# Patient Record
Sex: Male | Born: 1960
Health system: Southern US, Community
[De-identification: ages and names within clinical notes are randomized; demographics above are authoritative.]

## PROBLEM LIST (undated history)

## (undated) DIAGNOSIS — E119 Type 2 diabetes mellitus without complications: Secondary | ICD-10-CM

## (undated) DIAGNOSIS — I10 Essential (primary) hypertension: Secondary | ICD-10-CM

## (undated) DIAGNOSIS — K219 Gastro-esophageal reflux disease without esophagitis: Secondary | ICD-10-CM

## (undated) DIAGNOSIS — Z9889 Other specified postprocedural states: Secondary | ICD-10-CM

## (undated) DIAGNOSIS — I5042 Chronic combined systolic (congestive) and diastolic (congestive) heart failure: Secondary | ICD-10-CM

## (undated) DIAGNOSIS — I350 Nonrheumatic aortic (valve) stenosis: Secondary | ICD-10-CM

## (undated) DIAGNOSIS — B3322 Viral myocarditis: Secondary | ICD-10-CM

## (undated) DIAGNOSIS — I719 Aortic aneurysm of unspecified site, without rupture: Secondary | ICD-10-CM

## (undated) DIAGNOSIS — F32A Depression, unspecified: Secondary | ICD-10-CM

## (undated) DIAGNOSIS — I7121 Aneurysm of the ascending aorta, without rupture: Secondary | ICD-10-CM

## (undated) DIAGNOSIS — I712 Thoracic aortic aneurysm, without rupture: Secondary | ICD-10-CM

## (undated) DIAGNOSIS — Z953 Presence of xenogenic heart valve: Secondary | ICD-10-CM

## (undated) DIAGNOSIS — F419 Anxiety disorder, unspecified: Secondary | ICD-10-CM

## (undated) DIAGNOSIS — I428 Other cardiomyopathies: Secondary | ICD-10-CM

## (undated) DIAGNOSIS — I77810 Thoracic aortic ectasia: Secondary | ICD-10-CM

## (undated) DIAGNOSIS — I2510 Atherosclerotic heart disease of native coronary artery without angina pectoris: Secondary | ICD-10-CM

## (undated) DIAGNOSIS — Z8679 Personal history of other diseases of the circulatory system: Secondary | ICD-10-CM

## (undated) DIAGNOSIS — I471 Supraventricular tachycardia, unspecified: Secondary | ICD-10-CM

## (undated) DIAGNOSIS — Q2543 Congenital aneurysm of aorta: Secondary | ICD-10-CM

## (undated) DIAGNOSIS — E559 Vitamin D deficiency, unspecified: Secondary | ICD-10-CM

## (undated) DIAGNOSIS — J302 Other seasonal allergic rhinitis: Secondary | ICD-10-CM

## (undated) DIAGNOSIS — E785 Hyperlipidemia, unspecified: Secondary | ICD-10-CM

## (undated) HISTORY — DX: Gastro-esophageal reflux disease without esophagitis: K21.9

## (undated) HISTORY — PX: CARDIAC VALVE REPLACEMENT: SHX585

## (undated) HISTORY — DX: Depression, unspecified: F32.A

## (undated) HISTORY — DX: Type 2 diabetes mellitus without complications: E11.9

## (undated) HISTORY — DX: Hyperlipidemia, unspecified: E78.5

## (undated) HISTORY — DX: Thoracic aortic ectasia: I77.810

## (undated) HISTORY — DX: Anxiety disorder, unspecified: F41.9

## (undated) HISTORY — DX: Other seasonal allergic rhinitis: J30.2

## (undated) HISTORY — DX: Essential (primary) hypertension: I10

## (undated) HISTORY — DX: Viral myocarditis: B33.22

## (undated) HISTORY — PX: INGUINAL HERNIA REPAIR: SUR1180

## (undated) HISTORY — DX: Vitamin D deficiency, unspecified: E55.9

---

## 1980-02-08 HISTORY — PX: ORCHIECTOMY: SHX2116

## 1998-05-09 HISTORY — PX: VARICOSE VEIN SURGERY: SHX832

## 1998-05-11 ENCOUNTER — Ambulatory Visit (HOSPITAL_COMMUNITY): Admission: RE | Admit: 1998-05-11 | Discharge: 1998-05-11 | Payer: Self-pay | Admitting: Vascular Surgery

## 1998-05-11 ENCOUNTER — Encounter: Payer: Self-pay | Admitting: Vascular Surgery

## 1999-03-16 ENCOUNTER — Emergency Department (HOSPITAL_COMMUNITY): Admission: EM | Admit: 1999-03-16 | Discharge: 1999-03-16 | Payer: Self-pay | Admitting: Emergency Medicine

## 2000-11-29 ENCOUNTER — Ambulatory Visit (HOSPITAL_COMMUNITY): Admission: RE | Admit: 2000-11-29 | Discharge: 2000-11-29 | Payer: Self-pay | Admitting: Internal Medicine

## 2000-11-29 ENCOUNTER — Encounter: Payer: Self-pay | Admitting: Internal Medicine

## 2002-02-07 DIAGNOSIS — B3322 Viral myocarditis: Secondary | ICD-10-CM

## 2002-02-07 HISTORY — DX: Viral myocarditis: B33.22

## 2002-04-09 ENCOUNTER — Encounter: Payer: Self-pay | Admitting: Internal Medicine

## 2002-04-09 ENCOUNTER — Ambulatory Visit (HOSPITAL_COMMUNITY): Admission: RE | Admit: 2002-04-09 | Discharge: 2002-04-09 | Payer: Self-pay | Admitting: Internal Medicine

## 2002-04-17 ENCOUNTER — Encounter: Payer: Self-pay | Admitting: Internal Medicine

## 2002-04-17 ENCOUNTER — Ambulatory Visit (HOSPITAL_COMMUNITY): Admission: RE | Admit: 2002-04-17 | Discharge: 2002-04-17 | Payer: Self-pay | Admitting: Internal Medicine

## 2002-05-18 ENCOUNTER — Observation Stay (HOSPITAL_COMMUNITY): Admission: EM | Admit: 2002-05-18 | Discharge: 2002-05-19 | Payer: Self-pay | Admitting: Emergency Medicine

## 2002-05-18 ENCOUNTER — Encounter: Payer: Self-pay | Admitting: Internal Medicine

## 2002-06-13 ENCOUNTER — Encounter (INDEPENDENT_AMBULATORY_CARE_PROVIDER_SITE_OTHER): Payer: Self-pay | Admitting: *Deleted

## 2002-06-13 ENCOUNTER — Encounter: Payer: Self-pay | Admitting: Urology

## 2002-06-13 ENCOUNTER — Ambulatory Visit (HOSPITAL_COMMUNITY): Admission: RE | Admit: 2002-06-13 | Discharge: 2002-06-13 | Payer: Self-pay | Admitting: Urology

## 2002-08-09 ENCOUNTER — Ambulatory Visit (HOSPITAL_COMMUNITY): Admission: RE | Admit: 2002-08-09 | Discharge: 2002-08-09 | Payer: Self-pay | Admitting: Internal Medicine

## 2002-08-09 ENCOUNTER — Encounter: Payer: Self-pay | Admitting: Internal Medicine

## 2002-08-26 ENCOUNTER — Ambulatory Visit (HOSPITAL_COMMUNITY): Admission: RE | Admit: 2002-08-26 | Discharge: 2002-08-26 | Payer: Self-pay | Admitting: Cardiology

## 2006-08-01 ENCOUNTER — Ambulatory Visit: Payer: Self-pay | Admitting: Internal Medicine

## 2006-08-01 ENCOUNTER — Inpatient Hospital Stay (HOSPITAL_COMMUNITY): Admission: EM | Admit: 2006-08-01 | Discharge: 2006-08-03 | Payer: Self-pay | Admitting: Emergency Medicine

## 2006-08-02 ENCOUNTER — Encounter: Payer: Self-pay | Admitting: Internal Medicine

## 2006-08-17 ENCOUNTER — Observation Stay (HOSPITAL_COMMUNITY): Admission: EM | Admit: 2006-08-17 | Discharge: 2006-08-18 | Payer: Self-pay | Admitting: Emergency Medicine

## 2006-08-17 ENCOUNTER — Ambulatory Visit: Payer: Self-pay | Admitting: Cardiology

## 2008-02-08 HISTORY — PX: HERNIA REPAIR: SHX51

## 2008-09-25 ENCOUNTER — Observation Stay: Payer: Self-pay | Admitting: General Surgery

## 2010-06-22 NOTE — H&P (Signed)
NAMEJAVELL, Tyler Berger                  ACCOUNT NO.:  [id number]   MEDICAL RECORD NO.:  [id number]          PATIENT TYPE:  EMS   LOCATION:  MAJO                         FACILITY:  [facility]   PHYSICIAN:  Bevelyn Buckles. Bensimhon, MDDATE OF BIRTH:  1960/08/31   DATE OF ADMISSION:  08/01/2006  DATE OF DISCHARGE:                              HISTORY & PHYSICAL   PRIMARY CARDIOLOGIST:  Dr. Rollene Rotunda.   PRIMARY CARE PHYSICIAN:  Dr. Marlowe Shores.   HISTORY OF PRESENT ILLNESS:  This is a 50 year old Caucasian male with  known history of nonischemic cardiomyopathy, hyperlipidemia,  pancreatitis and aortic valve disease who has not been seen by  cardiology in approximately 4 years.  He had been followed by his  primary care physician and had some medication adjusted probably 8  months ago secondary to insurance issues since he had been recently laid  off.  The patient has been complaining of shortness of breath, which  occurred in the middle of the night, last night, and again this morning.  When he awoke he felt like someone was sitting his chest.  There was  some radiation down the left arm with some tingling in his hand and  having trouble taking a deep breath.  The patient did not take anything  for the pain and eventually came to the emergency room with these  complaints.  The patient was given morphine, nitroglycerin and aspirin  in the emergency room and now the pain is significantly diminished.  He  is able to talk and not complaining of any chest pain.  He does have  some mild discomfort when taking deep breaths.  The patient admits that  he has continued to smoke, although has been advised against it, but per  cardiology 4 years ago.   REVIEW OF SYSTEMS:  Positive for some sweating, some shortness of  breath, pressure in the chest, some claudication symptoms with walking  his dogs and also some mild nausea without vomiting this a.m. Otherwise  is negative.   PAST MEDICAL HISTORY:   Includes:  1. Hypertension.  2. Hyperlipidemia.  3. Pericarditis.  4. Left kidney cyst.  5. Left testicle removal.  6. Nonischemic cardiomyopathy.  7. Aortic valve disease.   PAST SURGICAL HISTORY:  He had surgery for an undistended testicle.   FAMILY HISTORY:  Mother is alive at age 43 with a history of breast  cancer, diabetes and obesity.  Father died of head and neck cancer.  He  has 1 sister who is 84 years old who is in good health.   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  The patient is a 1-2 pack a day smoker  on and off over the last 30 years.  He drinks 12 beers a week, but his  partners he diminishes this and can drink up to 8 beers a night several  times a week.  The patient walks approximately 1/2 to 1 mile a day.  No  herbal medicine use.  No illicit drug use.  CURRENT MEDICATIONS:  At home:  1. Lasix 80 mg once a day.  2. Atenolol 100 mg once a day.  3. Enalapril 20 mg once a day.  (the following meds the patient was taking, but has stopped x8 months).  1. Digoxin.  2. Spironolactone.  3. A statin.   CURRENT LABS1:  Sodium 138, potassium 4.2, chloride 103, CO2 16,  creatinine 1.0, glucose 108, hemoglobin 19.0, hematocrit 56.0.  Chest x-  ray reveals cardiomyopathy, COPD with no acute cardiopulmonary process.  EKG reveals normal sinus rhythm.  Ventricular rate is 78 beats per  minute.  Normal axis.  PR 0.20, QRS 0.08.  There is some nonspecific  changes in leads V1, 2, 4 and 5.   PHYSICAL EXAMINATION:  VITAL SIGNS:  Blood pressure 121/64.  Heart rate  77.  Respirations 16.  O2 saturation 99% on 2 liters, 96% on room air.  HEENT:  Head is normocephalic, atraumatic.  Eyes:  PERRLA.  Mucous  membranes and mouth pink and moist.  Tongue is midline.  NECK:  Supple.  There is no JVD.  There are bilateral carotid bruits  appreciated.  CARDIOVASCULAR:  Regular rate and rhythm.  A 2/6 systolic murmur   auscultated with radiation to the carotids.  LUNGS:  Essentially clear to auscultation, diminished in the bases.  ABDOMEN:  Soft, nontender, 2+ bowel sounds.  No organomegaly is noted.  EXTREMITIES:  No clubbing or edema.  There is some mild cyanosis noted  around the ankle area with some varicosities noted.  NEURO:  Intact.   PAST CARDIAC WORKUP:  Cardiac catheterization, August 26, 2002, revealing  left main normal, LAD diffuse luminal irregularities, first diagonal was  large with luminal irregularities, the second diagonal was small with  luminal irregularities, the second diagonal was small with luminal  irregularities, the circumflex had diffuse luminal irregularities, right  coronary artery was a large dominant vessels with 10-25% lesions before  the PDA.  The left ventriculogram, at that time, in the REO position  revealed an EF of 20%.   The patient was also seen in Dr. Jenene Slicker office in 2004 with repeat  echocardiogram revealing normal EF.   IMPRESSION:  1. Chest pain in a patient with known nonischemic cardiomyopathy.  2. Anxiety.  3. ETOH abuse.  4. Hypercholesterolemia.  5. History of pulmonary hypertension.  6. Hypertension.   PLAN:  This is a 50 year old Caucasian male patient with a known history  of nonischemic cardiomyopathy with complaints of chest pain, shortness  of breath with radiation to the left arm with negative EKG and point of  care markers at present.  The patient was seen and examined by myself  and Dr. Arvilla Meres in the emergency room.  The patient's chest  pain is concerning for unstable angina, but with normal cath in 2004,  this is less worrisome.  The patient has negative EKG and negative point  of care markers.  We will admit the patient to rule out myocardial  infarction.  We will start the patient on heparin, aspirin and check a  2D echo for left ventricular systolic function and aortic valve evaluation with known history of aortic  valve stenosis.  If workup is  negative and pain  resolves, we will do a stress Myoview; otherwise, we will proceed with  cardiac catheterization.  Smoking cessation consult will be completed.  We will also watch for delirium tremens in a patient with heavy alcohol  use at home and we will give benzodiazepines  and proton pump inhibitors  throughout admission.      Bettey Mare. Lyman Bishop, NP      Bevelyn Buckles. Bensimhon, MD  Electronically Signed    KML/MEDQ  D:  08/01/2006  T:  08/01/2006  Job:  [id number]   cc:   Lucky Cowboy, M.D.

## 2010-06-22 NOTE — H&P (Signed)
Tyler Berger, Tyler Berger NO.:  [id number]   MEDICAL RECORD NO.:  [id number]          PATIENT TYPE:  INP   LOCATION:  3743                         FACILITY:  [facility]   PHYSICIAN:  Rollene Rotunda, MD, FACCDATE OF BIRTH:  [birth year]   DATE OF ADMISSION:  08/17/2006  DATE OF DISCHARGE:                              HISTORY & PHYSICAL   PRIMARY CARE PHYSICIAN:  Dr. Oneta Rack.   PRIMARY CARDIOLOGIST:  Dr. Antoine Poche.   HISTORY:  This is a 50 year old white male who is [facility] via EMS [facility] with chest discomfort.  Initial interpretation  of EKG by EMS showed J-point elevation and was initially called in as a  non-STIMI.  However once in the emergency room comparison to old EKGs  did not show any acute changes.   Tyler Berger was recently hospitalized from June 24 to August 03, 2006 with  chest discomfort.  During this admission he underwent stress testing.  EKG did not show any changes.  EF was 53% without signs of ischemia.  Echocardiogram was also performed.  This showed some mild diastolic  dysfunction with EF of 60-65%, mild to moderate AS and AI with a VTI of  2.62 and a mean gradient of 23.  He was discharged home with medical  treatment and reassurance.   Since his discharge on the 26th on the initial first or second day Mr.  Berger states that he felt weak and somewhat drained.  However, he felt  fine until approximately 2-3 days ago when he noticed some, what he  describes as indigestion type feeling and a sensation that something was  caught in his throat.  He felt fine until this morning at approximately  9:00 a.m. when he developed anterior chest discomfort which he described  as someone sitting on his chest.  He felt like he could not get a deep  breath and also admitted to some shortness of breath and nausea.  He  denied any vomiting or diaphoresis.  The discomfort did not radiate.  He  felt initially the discomfort was a 5 and  approximately around noon he  summoned EMS.  He received aspirin, sublingual nitroglycerin and  morphine reducing his discomfort to a 3.  He felt that chest discomfort  was similar to his last admission.  He has also become very anxious with  this chest discomfort.  Thus his presentation.  The patient also states  that he did take a Xanax sometime this morning when he was becoming very  anxious and this did not help his discomfort.   The only thing he has had to eat today was coffee and he also states  that the above discomfort does not resemble the indigestion type feeling  that he had a couple of days ago.   PAST MEDICAL HISTORY:  In addition to the above includes no known drug  allergies.   CURRENT MEDICATIONS:  Include Celexa 40 mg daily, Xanax 0.5 mg p.r.n.,  enalapril 20 mg daily, Lasix 80 mg daily, atenolol 100 mg daily,  Pravachol 40 mg daily, aspirin  81 mg p.r.n.   History includes hypertension, hyperlipidemia.  Last lipid check was  August 02, 2006 and showed a total cholesterol 180, triglycerides 75, HDL  31, LDL 134.  He has a history of pericarditis, left kidney cyst, left  testicle removal, nonischemic cardiomyopathy.  Catheterization in 2004  showed the RCA that was a large dominant vessel with mid 25% tandem  lesions just prior to the PDA.  Otherwise luminal irregularities in the  first and second diagonal and circumflex and LAD.  EF was 20% with  global hypokinesis.  Subsequent echocardiogram on August 02, 2006 showed  an EF of 60-65%.  No LV wall motion abnormalities, mild LVH, mild to  moderate AS with a mean gradient of 23 and a VTI of 2.62.  V max 1.7.   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  He continues to smoke at least one to two packs a  day over the preceding 30 years.  His partner states he drinks at least  three beers per day however elaborates that he used to drink up to 8  beers a day.  The patient  does walk approximately half to one mile per  day.  Denies any illicit drug use, herbal medications and tries to eat  healthy.   FAMILY HISTORY:  Mother is alive at 41 with a history of breast cancer,  diabetes and obesity.  Father is deceased with head and neck cancer.  He  has one sister who is 61 who is in good health.   REVIEW OF SYSTEMS:  In addition to above is notable for anxiety.  Occasional nausea without vomiting.   PHYSICAL EXAM:  GENERAL:  Well-nourished, well-developed, pleasant white  male in no apparent distress.  VITAL SIGNS:  Temperature is 98, blood pressure 112/67, pulse 75,  respirations 20, 98% sat on room air.  HEENT: Was unremarkable.  NECK:  Supple without thyromegaly, adenopathy, JVD or carotid bruits.  CHEST:  Symmetrical excursion.  LUNGS:  Clear to auscultation although somewhat diminished in the bases.  HEART:  PMI is not displaced.  Regular rate and rhythm.  He does have a  2/6 systolic murmur best appreciated upper sternal border radiating to  the carotids.  ABDOMEN:  Slightly rounded.  Bowel sounds present without organomegaly,  masses or tenderness.  EXTREMITIES:  Negative cyanosis, clubbing or edema.  All peripheral  pulses are symmetrical and intact.  He does have some lower extremity  varicosities.  Skin is intact.  NEUROLOGIC:  Intact.   EKGs by EMS showed normal sinus rhythm with J-point elevation.  EKG in  the emergency room also shows J-point elevation, but essentially remains  unchanged from prior EKG.   IMPRESSION:  1. Prolonged chest discomfort of uncertain etiology.  2. Anxiety.  3. Continued tobacco and alcohol use.  4. History of dilated cardiomyopathy with resolution of the LV      dysfunction based on recent echocardiogram.   DISPOSITION:  Dr. Antoine Poche reviewed the patient's history, spoke with  and examined the patient and agrees with the above.  We will admit him  for observation to rule out myocardial infarction.  Given his  history  and presentation we will perform a cardiac CT to further evaluate his  cardiac anatomy.  If this is negative we will contact GI tomorrow after  the CT scan for further evaluation of his symptoms.  He is encouraged  tobacco cessation as well  as  alcohol reduction.  He should probably have a psych evaluation for  depression, EtOH and anxiety.  This could potentially be done as an  outpatient.  We will ask case manager for referral.  If the cardiac CT  is negative he could be potentially be discharged home tomorrow.      Joellyn Rued, PA-C      Rollene Rotunda, MD, [facility]  Electronically Signed    EW/MEDQ  D:  08/17/2006  T:  08/18/2006  Job:  [id number] [id number]   cc:   Lucky Cowboy, M.D.

## 2010-06-22 NOTE — Discharge Summary (Signed)
Tyler Berger                  ACCOUNT NO.:  [id number]   MEDICAL RECORD NO.:  [id number]          PATIENT TYPE:  INP   LOCATION:  4713                         FACILITY:  [facility]   PHYSICIAN:  Tyler Berger, Tyler Berger, Tyler Berger OF BIRTH:  Oct 31, 1960   DATE OF ADMISSION:  08/01/2006  DATE OF DISCHARGE:  08/03/2006                               DISCHARGE SUMMARY   PROCEDURES:  1. A 2-D echocardiogram.  2. Gated exercise treadmill Myoview.   PRIMARY DIAGNOSIS:  Chest pain, enzymes negative for myocardial  infarction and Myoview negative for ischemia.   SECONDARY DIAGNOSES:  1. Hypertension.  2. Hyperlipidemia.  3. Remote history of pericarditis.  4. History of left kidney cyst.  5. Status post left orchiectomy.  6. History of cardiomyopathy, EF 20% in 2004, now normalized.  7. Obesity.  8. Ongoing tobacco and EtOH use.   TIME AT DISCHARGE:  Forty-two minutes.   HOSPITAL COURSE:  Tyler Berger is a 50 year old male with a previous  history of nonischemic cardiomyopathy and an EF in 2004 of 20%.  He came  in with shortness of breath and chest pressure which was improved by a  combination of aspirin, nitroglycerin, and morphine.  He was admitted  for further evaluation.   His cardiac enzymes were negative for MI.  A 2-D echocardiogram was  ordered to follow up on possible aortic valve disease and his EF was 60-  65% now with no regional wall motion abnormalities, although he did have  some diastolic dysfunction and mild to moderate aortic valve stenosis  and aortic valvular regurgitation with an aortic valve area by VTI of  2.62-cm2 and a mean transaortic valve gradient of 23-mmHg.  A chest x-  ray showed COPD but no acute disease.  An exercise treadmill Myoview was  ordered to assess for ischemia.  During the treadmill portion of the  test, he had no chest pain and the exercise-limiting factor was  shortness of breath.  After the treadmill portion of the test was  completed and he was  in recovery, he had some left-sided chest pain  which he stated was similar to his presenting symptom but not quite as  bad.  He had some nonspecific ST changes on his EKG.  His blood pressure  responded appropriately to exercise but was controlled at rest.  He also  had dyslipidemia with a total cholesterol of 180, triglycerides 75, HDL  31, LDL 134.  He is encouraged to eat a heart healthy diet.  He was also  counseled on smoking cessation and stated he was willing to start  Chantix.  Of note, he had thrombocytopenia with a platelet count of 123  on admission.  He had been placed on heparin and then his platelets  dropped from 123-90.  Of note, his labs were reviewed from 2000 and  2004, and his platelet count was 108 in 2000 and 110 in 2004.   His stress test was read as no scar or ischemia and an EF of 53%.  Dr.  Gala Berger reviewed the EKGs and felt that there were nonspecific changes  and not clearly ischemic in origin.  The results were discussed with the  patient, and he was advised that this was a low risk study, and he was  considered stable for discharge.  He was advised that if he continued to  have symptoms, he should recontact [place] and cardiac catheterization could  be considered at that time.  [name] was discharged on August 03, 2006,  with outpatient followup arranged.   DISCHARGE INSTRUCTIONS:  1. His activity level is to be increased gradually.  2. He is to follow up with Dr. Antoine Berger on July 23rd at 4:30 or sooner      on a p.r.n. basis.  3. He is to follow up with Dr. Milinda Berger on noncardiac causes of chest      pain.  4. He is encouraged to stick to a heart healthy diet.  5. He is advised to limit his beer to one 12 ounces beer daily and not      to use tobacco at all.   DISCHARGE MEDICATIONS:  1. Lasix 80 mg daily.  2. Atenolol 110 mg daily.  3. Aspirin 81 mg daily.  4. Enalapril 20 mg daily.  5. Chantix take as directed, prescription given.      Tyler Demark, PA-C      Tyler Berger, Tyler Berger, Tyler Berger  Electronically Signed    RB/MEDQ  D:  08/03/2006  T:  08/03/2006  Job:  [id number]   cc:   Tyler Berger, Tyler Berger

## 2010-06-22 NOTE — Discharge Summary (Signed)
NAMECORNELIUS, Berger                  ACCOUNT NO.:  [id number]   MEDICAL RECORD NO.:  [id number]          PATIENT TYPE:  INP   LOCATION:  3743                         FACILITY:  [facility]   PHYSICIAN:  Noralyn Pick. Eden Emms, MD, FACCDATE OF BIRTH:  1960-05-07   DATE OF ADMISSION:  08/17/2006  DATE OF DISCHARGE:  08/18/2006                               DISCHARGE SUMMARY   DISCHARGE DIAGNOSES:  1. Noncardiac chest discomfort.  2. Anxiety.  3. Alcohol and tobacco use.  4. History is noted below.   PROCEDURES PERFORMED:  Cardiac CT on August 18, 2006, by Dr. Eden Emms.   SUMMARY OF HISTORY:  Tyler Berger is a 50 year old white male who presents  to the emergency room with recurring chest discomfort.  He was initially  evaluated approximately 2-3 weeks ago with similar symptoms.  An  adenosine Myoview at that time showed a normal ejection fraction and no  signs of ischemia.  Remote catheterization did not show any evidence of  coronary artery disease.  He presents with the same symptoms.  It is  also noted that he is very anxious on admission.   Past medical history in addition to above is notable for hypertension  and hyperlipidemia.  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  EF was 20% at  that time.  Subsequent echocardiogram showed an EF of 60-65% on June 25.  He also has mild to moderate AS with a bicuspid aortic valve.   He smokes at least one to two packs per day and according to his partner  drinks at least eight beers per day.   LABORATORY:  Chest x-ray on August 17, 2006, showed stable changes of  COPD, no acute abnormalities.  EKGs showed normal sinus rhythm, left  axis deviation, J-point elevation which was essentially unchanged from  prior EKGs.  Admission H&H is 18.7 and 55.0, normal indices, platelets  139, WBC 10.7.  PTT 29, PT 13.3.  Sodium 139, potassium 3.9, BUN 16,  creatinine 0.9, glucose 107, normal LFTs.  CK, MBs and  troponins were  negative x1.  TSH 1.611.   HOSPITAL COURSE:  The patient was admitted overnight for observation.  Overnight he felt sluggish but he did not have any further chest  discomfort.  Dr. Eden Emms performed a cardiac CT on August 18, 2006.  This  showed a probable bicuspid aortic valve, calcium score of 87, EF of 60%.  Dr. Eden Emms recommended an echocardiogram in 6 months.   After review of findings with the patient and his partner, he was  discharged home.   DISPOSITION:  The patient is discharged home.  Wound care and activities  are not applicable.  He was given permission return to work.  He  received a new prescription for Protonix 40 mg daily.  He was asked to  continue his home medications, which include:   1. Atenolol 100 mg daily.  2. Enalapril 20 mg daily.  3. Pravastatin 40 mg q.h.s.  4. Alprazolam 0.5 mg as previously.  5. Aspirin 81 mg daily.  6.  Citalopram 40 mg daily.  7. Furosemide 80 mg daily.   He will follow up with Dr. Jarold Motto on August 5 at 10 a.m.  A message  has been left at the office that he will need a follow-up echocardiogram  in approximately 6 months to reevaluate his aortic stenosis.  Also, he  was asked to limit alcohol to one beer per day and was advised no  smoking or tobacco products.  He will follow up Dr. Antoine Poche in  approximately 6 months post his echocardiogram and Dr. Milinda Cave as  needed.  Just prior to discharge case management assisted with  information in regard to local clinics for support with alcohol  cessation and possible psychiatric support to assist with his anxiety.   DISCHARGE TIME:  35 minutes.      Joellyn Rued, PA-C      Noralyn Pick. Eden Emms, MD, [facility]  Electronically Signed    EW/MEDQ  D:  08/18/2006  T:  08/18/2006  Job:  [id number]   cc:   Rollene Rotunda, MD, [facility]  Jeoffrey Massed, MD  Vania Rea. Jarold Motto, MD, [facility], FAGA

## 2010-06-25 NOTE — Discharge Summary (Signed)
NAMEJERRYL, HOLZHAUER                            ACCOUNT NO.:  [id number]   MEDICAL RECORD NO.:  [id number]                   PATIENT TYPE:  INP   LOCATION:  5006                                 FACILITY:   PHYSICIAN:  Aundra Dubin, M.D.            DATE OF BIRTH:  14-May-1960   DATE OF ADMISSION:  05/18/2002  DATE OF DISCHARGE:                                 DISCHARGE SUMMARY   CHIEF COMPLAINT:  Shortness of breath, sharp chest pain.   HISTORY OF PRESENT ILLNESS:  Mr. Tyler Berger is a 50 year old white male who has  been short of breath for about two days before having an episode of sharp  chest pain around 4 p.m. on May 18, 2002.  He was brought to the [facility] ER.  He was found to have a mild elevation of troponin at 0.06 and a CK of 68.  His fibrinogen was 0.71.  His EKG showed tachycardia, PVCs and poor R wave  progression.  His oxygen saturation remained in the 90s.   I evaluated him and he was completely asymptomatic at that time except for  being further short of breath. He felt that there was no URI or fever.  There was no diaphoresis or left arm pain with the episode of sharp pain  that lasted about 30 minutes.  He is a long term smoker and has  hypertension.  He was admitted for observation.   PAST MEDICAL AND SURGICAL HISTORY:  1. Hypertension.  2. Surgery for an undescended testicle.  3. He is undergoing a workup for a spot on my kidney.  He is being     evaluated by Dr. Isabel Caprice.   MEDICATIONS:  1. Maxzide 75/50 daily.  2. Cardura 8 mg daily.  3. Tenormin 100 mg daily.  4. ? Lotensin 10 mg daily.  5. Wellbutrin.   DRUG INTOLERANCES:  None.   SOCIAL HISTORY:  He works for a [employer].  He smokes one pack of  cigarettes a day.  He drinks a small amount of alcohol.  He has two children  and is currently separated.   FAMILY HISTORY:  Noncontributory.   PHYSICAL EXAMINATION:  VITAL SIGNS:  Temperature 97.6, pulse 118 lowering to  106, respirations 28  lowering to 20, blood pressure 138/108 lowering to  124/85.  GENERAL:  At that time he was in no distress.  SKIN:  Cool.  LUNGS:  He had some significant rhonchi.  HEENT:  PERRL.  EOMI.  Mouth clear.  NECK:  Negative JVD.  HEART:  Tachycardic.  No murmurs.  EXTREMITIES:  Lower extremities trace edema.  MUSCULOSKELETAL:  There were no swollen joints.  NEUROLOGIC:  Nonfocal.   HOSPITAL COURSE:  He was admitted and given a handheld nebulizer.  He was  placed on oxygen which he used intermittently.  When I evaluated him on the  morning of May 19, 2002 he was asymptomatic except  being slightly short of  breath with some movements.  His lungs were improved at that time.  He had  received Solu-Medrol 120 mg for one dose.  He was given Lasix 20 mg on the  evening of May 18, 2002 and in the morning of May 19, 2002.  Potassium  was slightly low at 3.3 and I gave him 20 mEq of KCl.  He was also given  another handheld nebulized treatment before leaving.   A repeat EKG on May 19, 2002 showed small R waves to the anterior chest  leads.  He had voltage criteria for LVH and there was a PVC.  He was still  slightly tachycardic at about 105 when I counted it that morning.  He had no  further chest pain.  The plan was to allow him to go home.  I gave him a six-day dose-pack of  prednisone 60 mg to 0. He was to take an aspirin 325 mg daily.  He will take  his blood pressure medicines as listed above.  He was given an aspirin on  April 10 and May 19, 2002.   FOLLOW UP:  He is to take with Dr. Kathryne Sharper office on May 20, 2002 and be  seen that day.  I believe he needs to be evaluated by a cardiologist and  have the planned echocardiogram within 7-10 days.  He was counseled on  stopping smoking.  Diet will be regular, 4 g sodium.  He will also be  following up with Dr. Isabel Caprice concerning his kidney problems.                                                Aundra Dubin, M.D.     WWT/MEDQ  D:  05/19/2002  T:  05/19/2002  Job:  [id number]   cc:   Lucky Cowboy, M.D.  [address], [address]  [place], [place] [address]  Fax: [phone]   Valetta Fuller, M.D.  [address]  [place]  [place] [address]  Fax: [phone]

## 2010-06-25 NOTE — Cardiovascular Report (Signed)
Tyler Berger, Tyler Berger                            ACCOUNT NO.:  [id number]   MEDICAL RECORD NO.:  [id number]                   PATIENT TYPE:  OIB   LOCATION:  2899                                 FACILITY:  [facility]   PHYSICIAN:  Rollene Rotunda, M.D.                DATE OF BIRTH:  04/22/1960   DATE OF PROCEDURE:  08/26/2002  DATE OF DISCHARGE:                              CARDIAC CATHETERIZATION   PRIMARY CARE PHYSICIAN:  Lucky Cowboy, M.D.   PROCEDURE:  Left and right heart catheterization, coronary arteriography.   INDICATION:  Evaluate patient with cardiomyopathy.   PROCEDURE NOTE:  Left heart catheterization was performed via the right  femoral artery.  Right heart catheterization performed via the right femoral  vein.  Both vessels were cannulated using anterior wall puncture.  A 6  French arterial sheath was inserted via the modified Seldinger technique.  Preformed Judkins and a pigtail catheter were utilized as well as a Engineer, site catheter.  The left main was cannulated with a Judkins 5.  The patient  tolerated the procedure well and left the lab in stable condition.   RESULTS:   HEMODYNAMICS:  1. LV 127/16.  2. Aortic 123/82.  3. RA mean 12.  4. RV mean 19.  5. PA 50/25 with a mean of 38.  6. Pulmonary capillary wedge pressure mean 28.  7. Cardiac output/cardiac index (Fick) 4.3/2.0.   CORONARIES:  The left main was normal.  The LAD had diffuse luminal  irregularities.  The first diagonal was large with luminal irregularities.  The second diagonal was small with luminal irregularities.  The circumflex  had diffuse luminal irregularities.  The right coronary artery was a large  dominant vessel with mid tandem 25%  lesions before the PDA.   LEFT VENTRICULOGRAM:  Left ventriculogram was obtained in the RAO  projection.  The EF was 20% with global hypokinesis.   CONCLUSION:  1. Minimal coronary artery plaque.  2. Severe global left ventricular dysfunction.  3.  Moderate pulmonary hypertension.    PLAN:  The patient will be managed medically for his cardiomyopathy.  The  etiology is not clear.  He does have a heavily calcified aortic valve.  However, there is no gradient measured.  I doubt that this is a low output,  low gradient severe aortic stenosis.  The valves were seen to move well on  echo.                                                  Rollene Rotunda, M.D.    JH/MEDQ  D:  08/26/2002  T:  08/26/2002  Job:  [id number]  Lucky Cowboy, M.D.  [address], [address]  [place], [place] [address]  Fax: [phone]  cc:   Lucky Cowboy, M.D.  [address], [address]  [place], [place] [address]  Fax: [phone]

## 2010-11-23 LAB — POCT I-STAT CREATININE
Creatinine, Ser: 0.9
Operator id: 146091

## 2010-11-23 LAB — I-STAT 8, (EC8 V) (CONVERTED LAB)
Bicarbonate: 25.1 — ABNORMAL HIGH
Glucose, Bld: 107 — ABNORMAL HIGH
Potassium: 3.9
TCO2: 26
pH, Ven: 7.418 — ABNORMAL HIGH

## 2010-11-23 LAB — COMPREHENSIVE METABOLIC PANEL
CO2: 23
Calcium: 9.4
Creatinine, Ser: 0.9
GFR calc Af Amer: >60
GFR calc non Af Amer: >60
Glucose, Bld: 109 — ABNORMAL HIGH

## 2010-11-23 LAB — CBC
MCHC: 34.9
MCV: 85.4
Platelets: 139 — ABNORMAL LOW
WBC: 10.7 — ABNORMAL HIGH

## 2010-11-23 LAB — TROPONIN I: Troponin I: 0.01

## 2010-11-23 LAB — DIFFERENTIAL
Lymphocytes Relative: 22
Lymphs Abs: 2.4
Neutro Abs: 7.3
Neutrophils Relative %: 68

## 2010-11-23 LAB — POCT CARDIAC MARKERS
Operator id: 146091
Troponin i, poc: <0.05

## 2010-11-23 LAB — APTT: aPTT: 29

## 2010-11-23 LAB — CK TOTAL AND CKMB (NOT AT ARMC): Relative Index: INVALID

## 2010-11-23 LAB — MAGNESIUM: Magnesium: 2.2

## 2010-11-23 LAB — PROTIME-INR: Prothrombin Time: 13.3

## 2010-11-24 LAB — LIPID PANEL
Cholesterol: 180
LDL Cholesterol: 134 — ABNORMAL HIGH
Triglycerides: 75
VLDL: 15

## 2010-11-24 LAB — DIFFERENTIAL
Basophils Absolute: 0.1
Basophils Relative: 1
Eosinophils Absolute: 0.1
Monocytes Relative: 8
Neutro Abs: 5.5
Neutrophils Relative %: 59

## 2010-11-24 LAB — BASIC METABOLIC PANEL
BUN: 15
BUN: 16
CO2: 28
CO2: 30
Calcium: 9.3
Chloride: 104
Creatinine, Ser: 0.77
Creatinine, Ser: 0.9
GFR calc Af Amer: >60
Glucose, Bld: 94
Potassium: 3.9

## 2010-11-24 LAB — I-STAT 8, (EC8 V) (CONVERTED LAB)
BUN: 16
Bicarbonate: 29.7 — ABNORMAL HIGH
Chloride: 103
Glucose, Bld: 108 — ABNORMAL HIGH
HCT: 56 — ABNORMAL HIGH
Hemoglobin: 19 — ABNORMAL HIGH
Potassium: 4.2
Sodium: 138

## 2010-11-24 LAB — D-DIMER, QUANTITATIVE: D-Dimer, Quant: <0.22

## 2010-11-24 LAB — PROTIME-INR: INR: 1

## 2010-11-24 LAB — CARDIAC PANEL(CRET KIN+CKTOT+MB+TROPI)
CK, MB: 1.3
CK, MB: 1.4
Relative Index: INVALID
Relative Index: INVALID
Relative Index: INVALID
Total CK: 42
Troponin I: 0.01
Troponin I: <0.01

## 2010-11-24 LAB — CBC
Hemoglobin: 16
Hemoglobin: 17.5 — ABNORMAL HIGH
MCHC: 34.3
MCHC: 34.5
MCV: 85.9
MCV: 87.3
Platelets: 123 — ABNORMAL LOW
Platelets: 90 — ABNORMAL LOW
RBC: 5.82 — ABNORMAL HIGH
RBC: 6.09 — ABNORMAL HIGH
RDW: 13.8
WBC: 6.6
WBC: 8.1

## 2010-11-24 LAB — POCT CARDIAC MARKERS
Myoglobin, poc: 57.7
Operator id: 146091
Operator id: 146091
Troponin i, poc: <0.05

## 2010-11-24 LAB — TSH: TSH: 0.852

## 2010-11-24 LAB — HEPARIN LEVEL (UNFRACTIONATED): Heparin Unfractionated: 0.86 — ABNORMAL HIGH

## 2010-11-24 LAB — APTT: aPTT: 27

## 2010-11-24 LAB — CK TOTAL AND CKMB (NOT AT ARMC): CK, MB: 1.7

## 2010-11-24 LAB — B-NATRIURETIC PEPTIDE (CONVERTED LAB): Pro B Natriuretic peptide (BNP): <30

## 2012-08-06 ENCOUNTER — Encounter: Payer: Self-pay | Admitting: Gastroenterology

## 2012-09-20 ENCOUNTER — Encounter: Payer: Self-pay | Admitting: Gastroenterology

## 2012-10-03 ENCOUNTER — Encounter: Payer: Self-pay | Admitting: Gastroenterology

## 2013-02-21 ENCOUNTER — Ambulatory Visit (INDEPENDENT_AMBULATORY_CARE_PROVIDER_SITE_OTHER): Payer: Managed Care, Other (non HMO) | Admitting: Physician Assistant

## 2013-02-21 ENCOUNTER — Encounter: Payer: Self-pay | Admitting: Physician Assistant

## 2013-02-21 VITALS — BP 120/80 | HR 60 | Temp 98.1°F | Resp 16 | Ht 74.0 in | Wt 254.0 lb

## 2013-02-21 DIAGNOSIS — F17200 Nicotine dependence, unspecified, uncomplicated: Secondary | ICD-10-CM

## 2013-02-21 DIAGNOSIS — R079 Chest pain, unspecified: Secondary | ICD-10-CM

## 2013-02-21 DIAGNOSIS — J329 Chronic sinusitis, unspecified: Secondary | ICD-10-CM

## 2013-02-21 DIAGNOSIS — Q231 Congenital insufficiency of aortic valve: Secondary | ICD-10-CM

## 2013-02-21 DIAGNOSIS — K219 Gastro-esophageal reflux disease without esophagitis: Secondary | ICD-10-CM

## 2013-02-21 DIAGNOSIS — F411 Generalized anxiety disorder: Secondary | ICD-10-CM

## 2013-02-21 MED ORDER — DEXLANSOPRAZOLE 60 MG PO CPDR: 60.0000 mg | DELAYED_RELEASE_CAPSULE | Freq: Every day | ORAL | Status: DC

## 2013-02-21 MED ORDER — AZITHROMYCIN 250 MG PO TABS: ORAL_TABLET | ORAL | Status: AC

## 2013-02-21 MED ORDER — PREDNISONE 20 MG PO TABS: ORAL_TABLET | ORAL | Status: DC

## 2013-02-21 MED ORDER — ALPRAZOLAM 0.5 MG PO TABS: 0.5000 mg | ORAL_TABLET | Freq: Two times a day (BID) | ORAL | Status: DC | PRN

## 2013-02-21 MED ORDER — CITALOPRAM HYDROBROMIDE 20 MG PO TABS: 20.0000 mg | ORAL_TABLET | Freq: Every day | ORAL | Status: DC

## 2013-02-21 NOTE — Progress Notes (Signed)
   Subjective:    Patient ID: Tyler Berger, male    DOB: 20-Jul-1960, 53 y.o.   MRN: [id number]  HPI Patient states that for the past week he has had sinus congestion, cough and felt bad. 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 No radiation, reflux, waterbrash, no nausea, no dizziness. He then had a nurse at work take his BP and it was 160/90 so he came to the doctor, however here it is at 120/60. He has been taking alk plus at home and nyquil. Current smoker, counseled on stopping. Last echo in 2010 showed bicuspid aorta with mild-mod AS/AR, he has not followed up.  Very anxious, started to tear up. He states he is very anxious person and all of his coworkers have encouraged him to get on a medications. He can have panic attacks sometimes with palpations and SOB. He states he has excessive worry and can not stop.    Review of Systems  Constitutional: Positive for fatigue. Negative for fever, chills and diaphoresis.  HENT: Positive for congestion, postnasal drip, rhinorrhea and sinus pressure.   Eyes: Negative.   Respiratory: Positive for cough. Negative for shortness of breath and wheezing.   Cardiovascular: Negative for palpitations and leg swelling.  Gastrointestinal: Negative.   Genitourinary: Negative.   Musculoskeletal: Negative.        Objective:   Physical Exam  Constitutional: He is oriented to person, place, and time. He appears well-developed and well-nourished.  HENT:  Head: Normocephalic and atraumatic.  Right Ear: External ear normal.  Left Ear: External ear normal.  Nose: Right sinus exhibits maxillary sinus tenderness. Left sinus exhibits maxillary sinus tenderness.  Mouth/Throat: Oropharynx is clear and moist.  Eyes: Conjunctivae and EOM are normal. Pupils are equal, round, and reactive to light.  Neck:  Normal range of motion. Neck supple.  Cardiovascular: Normal rate and regular rhythm.   Murmur heard.  Systolic murmur is present with a grade of 4/6  Pulmonary/Chest: Effort normal and breath sounds normal.  Abdominal: Soft. Bowel sounds are normal.  Musculoskeletal: Normal range of motion.  Neurological: He is alert and oriented to person, place, and time. No cranial nerve deficit.  Skin: Skin is warm and dry.  Psychiatric: He has a normal mood and affect. His behavior is normal.       Assessment & Plan:  Questionable chest pain- Could also be GERD/sinusitis- given zpak/prednisone, Dexilant samples however with history will refer to Cardio for evaluation. EKG normal. 53 y/o white smoking male with history of bicuspid valve presents with questionable exertional chest pain and SOB, very atypical without radiation, dizziness only lasting mins but with history will refer. Needs follow up anyway due to valve.   Anxiety- celexa $RemoveBeforeD'20mg'fzJcDjzKQmlzZE$  1/2 for one week then a whole after that.     Xanax 0.$Remove'5mg'cWDvJiH$  #60 NR  Long discussion about smoking cessation, patient will think about starting chantix.

## 2013-02-21 NOTE — Patient Instructions (Addendum)
Please start the Celexa 1/2 daily for one week then go to one pill until you return Take the xanax as needed.   Call 911 if any chest pain  We are giving you chantix for smoking cessation. You can do it! And we are here to help! We could also try wellbutrin with the chantix.  You may have heard some scary side effects about chantix, the three most common I hear about are nausea, crazy dreams and depression.  However, I like for my patients to try to stay on 1/2 a tablet twice a day rather than one tablet twice a day as normally prescribed. This helps decrease the chances of side effects and helps save money by making a one month prescription last two months  Please start the prescription this way:  Start 1/2 tablet by mouth once daily after food with a full glass of water for 3 days Then do 1/2 tablet by mouth twice daily for 4 days.  At this point we have several options: 1) continue on 1/2 tablet twice a day- which I encourage you to do. You can stay on this dose the rest of the time on the medication or if you still feel the need to smoke you can do one of the two options below. 2) do one tablet in the morning and 1/2 in the evening which helps decrease dreams. 3) do one tablet twice a day.   What if I miss a dose? If you miss a dose, take it as soon as you can. If it is almost time for your next dose, take only that dose. Do not take double or extra doses.  What should I watch for while using this medicine? Visit your doctor or health care professional for regular check ups. Ask for ongoing advice and encouragement from your doctor or healthcare professional, friends, and family to help you quit. If you smoke while on this medication, quit again  Your mouth may get dry. Chewing sugarless gum or hard candy, and drinking plenty of water may help. Contact your doctor if the problem does not go away or is severe.  You may get drowsy or dizzy. Do not drive, use machinery, or do anything that  needs mental alertness until you know how this medicine affects you. Do not stand or sit up quickly, especially if you are an older patient.   The use of this medicine may increase the chance of suicidal thoughts or actions. Pay special attention to how you are responding while on this medicine. Any worsening of mood, or thoughts of suicide or dying should be reported to your health care professional right away.  ADVANTAGES OF QUITTING SMOKING  Within 20 minutes, blood pressure decreases. Your pulse is at normal level.  After 8 hours, carbon monoxide levels in the blood return to normal. Your oxygen level increases.  After 24 hours, the chance of having a heart attack starts to decrease. Your breath, hair, and body stop smelling like smoke.  After 48 hours, damaged nerve endings begin to recover. Your sense of taste and smell improve.  After 72 hours, the body is virtually free of nicotine. Your bronchial tubes relax and breathing becomes easier.  After 2 to 12 weeks, lungs can hold more air. Exercise becomes easier and circulation improves.  After 1 year, the risk of coronary heart disease is cut in half.  After 5 years, the risk of stroke falls to the same as a nonsmoker.  After 10 years, the  risk of lung cancer is cut in half and the risk of other cancers decreases significantly.  After 15 years, the risk of coronary heart disease drops, usually to the level of a nonsmoker.  You will have extra money to spend on things other than cigarettes.

## 2013-03-25 ENCOUNTER — Ambulatory Visit: Payer: Self-pay | Admitting: Cardiology

## 2013-03-25 DIAGNOSIS — E785 Hyperlipidemia, unspecified: Secondary | ICD-10-CM | POA: Insufficient documentation

## 2013-03-25 DIAGNOSIS — I10 Essential (primary) hypertension: Secondary | ICD-10-CM | POA: Insufficient documentation

## 2013-03-25 DIAGNOSIS — F419 Anxiety disorder, unspecified: Secondary | ICD-10-CM | POA: Insufficient documentation

## 2013-03-25 DIAGNOSIS — B3322 Viral myocarditis: Secondary | ICD-10-CM | POA: Insufficient documentation

## 2013-03-25 DIAGNOSIS — E559 Vitamin D deficiency, unspecified: Secondary | ICD-10-CM | POA: Insufficient documentation

## 2013-03-26 ENCOUNTER — Ambulatory Visit: Payer: Self-pay | Admitting: Physician Assistant

## 2013-04-22 ENCOUNTER — Encounter: Payer: Self-pay | Admitting: Emergency Medicine

## 2013-04-22 ENCOUNTER — Ambulatory Visit (INDEPENDENT_AMBULATORY_CARE_PROVIDER_SITE_OTHER): Payer: Managed Care, Other (non HMO) | Admitting: Emergency Medicine

## 2013-04-22 VITALS — BP 132/90 | HR 78 | Temp 100.0°F | Resp 18 | Ht 74.0 in | Wt 252.0 lb

## 2013-04-22 DIAGNOSIS — Z79899 Other long term (current) drug therapy: Secondary | ICD-10-CM

## 2013-04-22 DIAGNOSIS — E782 Mixed hyperlipidemia: Secondary | ICD-10-CM

## 2013-04-22 DIAGNOSIS — K122 Cellulitis and abscess of mouth: Secondary | ICD-10-CM

## 2013-04-22 DIAGNOSIS — E559 Vitamin D deficiency, unspecified: Secondary | ICD-10-CM

## 2013-04-22 DIAGNOSIS — I10 Essential (primary) hypertension: Secondary | ICD-10-CM

## 2013-04-22 MED ORDER — CEFTRIAXONE SODIUM 1 G IJ SOLR
1.0000 g | Freq: Once | INTRAMUSCULAR | Status: AC
  Administered 2013-04-22: 1 g via INTRAMUSCULAR

## 2013-04-22 MED ORDER — DEXAMETHASONE SODIUM PHOSPHATE 100 MG/10ML IJ SOLN
10.0000 mg | Freq: Once | INTRAMUSCULAR | Status: AC
  Administered 2013-04-22: 10 mg via INTRAMUSCULAR

## 2013-04-22 MED ORDER — DOXYCYCLINE HYCLATE 100 MG PO TABS: 100.0000 mg | ORAL_TABLET | Freq: Two times a day (BID) | ORAL | Status: DC

## 2013-04-22 NOTE — Patient Instructions (Signed)
Hypertension Hypertension is another name for high blood pressure. High blood pressure may mean that your heart needs to work harder to pump blood. Blood pressure consists of two numbers, which includes a higher number over a lower number (example: 110/72). HOME CARE   Make lifestyle changes as told by your doctor. This may include weight loss and exercise.  Take your blood pressure medicine every day.  Limit how much salt you use.  Stop smoking if you smoke.  Do not use drugs.  Talk to your doctor if you are using decongestants or birth control pills. These medicines might make blood pressure higher.  Females should not drink more than 1 alcoholic drink per day. Males should not drink more than 2 alcoholic drinks per day.  See your doctor as told. GET HELP RIGHT AWAY IF:   You have a blood pressure reading with a top number of 180 or higher.  You get a very bad headache.  You get blurred or changing vision.  You feel confused.  You feel weak, numb, or faint.  You get chest or belly (abdominal) pain.  You throw up (vomit).  You cannot breathe very well. MAKE SURE YOU:   Understand these instructions.  Will watch your condition.  Will get help right away if you are not doing well or get worse. Document Released: 07/13/2007 Document Revised: 04/18/2011 Document Reviewed: 07/13/2007 [facility] Patient Information 2014 [place], [place]. Wound Infection A wound infection happens when a type of germ (bacteria) grows in a wound. Caring for the infection can help the wound heal. Wound infections need treatment. HOME CARE   Only take medicine as told by your doctor.  Take your antibiotic medicine as told. Finish it even if you start to feel better.  Clean the wound with mild soap and water as told. Rinse the soap off. Pat the area dry with a clean towel. Do not rub the wound.  Change any bandages (dressings) as told by your doctor.  Put cream and a bandage on the wound as  told by your doctor.  If the bandage sticks, wet it with soapy water to remove the bandage.  Change the bandage if it gets wet, dirty, or starts to smell.  Take showers. Do not take baths, swim, or do anything that puts your wound under water.  Avoid exercise that makes you sweat.  If your wound itches, use a medicine that helps stop itching. Do not pick or scratch at the wound.  Keep all doctor visits as told. GET HELP RIGHT AWAY IF:   You have more puffiness (swelling), pain, or redness around the wound.  You have more yellowish-white fluid (pus) coming from the wound.  You have a bad smell coming from the wound.  Your wound breaks open more.  You have a fever. MAKE SURE YOU:   Understand these instructions.  Will watch your condition.  Will get help right away if you are not doing well or get worse. Document Released: 11/03/2007 Document Revised: 04/18/2011 Document Reviewed: 07/05/2010 [facility] Patient Information 2014 [place]. Abscess An abscess (boil or furuncle) is an infected area on or under the skin. This area is filled with yellowish-white fluid (pus) and other material (debris). HOME CARE   Only take medicines as told by your doctor.  If you were given antibiotic medicine, take it as directed. Finish the medicine even if you start to feel better.  If gauze is used, follow your doctor's directions for changing the gauze.  To  avoid spreading the infection:  Keep your abscess covered with a bandage.  Wash your hands well.  Do not share personal care items, towels, or whirlpools with others.  Avoid skin contact with others.  Keep your skin and clothes clean around the abscess.  Keep all doctor visits as told. GET HELP RIGHT AWAY IF:   You have more pain, puffiness (swelling), or redness in the wound site.  You have more fluid or blood coming from the wound site.  You have muscle aches, chills, or you feel sick.  You have a fever. MAKE  SURE YOU:   Understand these instructions.  Will watch your condition.  Will get help right away if you are not doing well or get worse. Document Released: 07/13/2007 Document Revised: 07/26/2011 Document Reviewed: 04/08/2011 [facility] Patient Information 2014 [place].

## 2013-04-22 NOTE — Progress Notes (Signed)
Subjective:    Patient ID: JDEN WANT, male    DOB: Jun 08, 1960, 53 y.o.   MRN: [id number]  HPI Comments: 52 yo male overdue for 3 month F/U for HTN, Cholesterol, Pre-Dm, D. Deficient. He is eating healthy. He is not exercising but keeps active. He notes BP has been good at home. LAST LABS with mild abnormal LDL  He noted yesterday swelling in upper lip with pain. He thought it was an ingrown hair at first but no relief with cleaning/ removing hair, or lancing with clean needle. He notes mild numbness and drainage after lancing. He has hx of fever blisters but not like this. He denies any new changes.    Current Outpatient Prescriptions on File Prior to Visit  Medication Sig Dispense Refill  . ALPRAZolam (XANAX) 0.5 MG tablet Take 1 tablet (0.5 mg total) by mouth 2 (two) times daily as needed for anxiety or sleep.  60 tablet  0  . aspirin 81 MG chewable tablet Chew by mouth daily.      Marland Kitchen atenolol (TENORMIN) 100 MG tablet Take 100 mg by mouth daily.      . Cholecalciferol (VITAMIN D PO) Take 10,000 Int'l Units by mouth daily.      . citalopram (CELEXA) 20 MG tablet Take 1 tablet (20 mg total) by mouth daily.  30 tablet  2  . dexlansoprazole (DEXILANT) 60 MG capsule Take 1 capsule (60 mg total) by mouth daily.  10 capsule  0  . furosemide (LASIX) 40 MG tablet Take 40 mg by mouth.      . quinapril (ACCUPRIL) 40 MG tablet Take 40 mg by mouth at bedtime.       No current facility-administered medications on file prior to visit.   No Known Allergies Past Medical History  Diagnosis Date  . Hyperlipidemia   . Hypertension   . Anxiety   . Vitamin D deficiency   . Viral myocarditis 2004     Review of Systems  Skin: Positive for color change and wound.  All other systems reviewed and are negative.   BP 132/90  Pulse 78  Temp(Src) 100 F (37.8 C) (Temporal)  Resp 18  Ht 6\' 2"  (1.88 m)  Wt 252 lb (114.306 kg)  BMI 32.34 kg/m2     Objective:   Physical Exam  Nursing note and  vitals reviewed. Constitutional: He is oriented to person, place, and time. He appears well-developed and well-nourished.  HENT:  Head: Normocephalic.    Right Ear: External ear normal.  Left Ear: External ear normal.  Nose: Nose normal.  Mouth/Throat: Oropharynx is clear and moist. No oropharyngeal exudate.  Poor dentition  Eyes: Conjunctivae and EOM are normal.  Neck: Normal range of motion. Neck supple. No JVD present. No thyromegaly present.  Cardiovascular: Normal rate, regular rhythm, normal heart sounds and intact distal pulses.   Pulmonary/Chest: Effort normal and breath sounds normal.  Abdominal: Soft. Bowel sounds are normal. He exhibits no distension and no mass. There is no tenderness. There is no rebound and no guarding.  Musculoskeletal: Normal range of motion. He exhibits no edema and no tenderness.  Lymphadenopathy:    He has no cervical adenopathy.  Neurological: He is alert and oriented to person, place, and time. He has normal reflexes. No cranial nerve deficit. Coordination normal.  Skin: Skin is warm and dry.  Psychiatric: He has a normal mood and affect. His behavior is normal. Judgment and thought content normal.  Assessment & Plan:  1.  3 month F/U for HTN, Cholesterol, Pre-Dm, D. Deficient. Needs healthy diet, cardio QD and obtain healthy weight. Check Labs, Check BP if >130/80 call office  2. ? Abscess of right upper lip-Rocephin 1 gm/ Dexamethasone 10 mg given in office, Doxy 100 BID AD, warm moist compresses TID, hygiene explained, recheck WED for possibly drainage, w/c if SX increase or ER.  Advised after area improves needs dental hygiene update

## 2013-04-23 LAB — CBC WITH DIFFERENTIAL/PLATELET
BASOS ABS: 0 10*3/uL (ref 0.0–0.1)
BASOS PCT: 0 % (ref 0–1)
EOS ABS: 0.1 10*3/uL (ref 0.0–0.7)
EOS PCT: 1 % (ref 0–5)
HEMATOCRIT: 49.8 % (ref 39.0–52.0)
Hemoglobin: 17.6 g/dL — ABNORMAL HIGH (ref 13.0–17.0)
Lymphocytes Relative: 14 % (ref 12–46)
Lymphs Abs: 2 10*3/uL (ref 0.7–4.0)
MCH: 31.1 pg (ref 26.0–34.0)
MCHC: 35.3 g/dL (ref 30.0–36.0)
MCV: 88 fL (ref 78.0–100.0)
MONO ABS: 1.3 10*3/uL — AB (ref 0.1–1.0)
Monocytes Relative: 9 % (ref 3–12)
Neutro Abs: 10.6 10*3/uL — ABNORMAL HIGH (ref 1.7–7.7)
Neutrophils Relative %: 76 % (ref 43–77)
Platelets: 132 10*3/uL — ABNORMAL LOW (ref 150–400)
RBC: 5.66 MIL/uL (ref 4.22–5.81)
RDW: 14.1 % (ref 11.5–15.5)
WBC: 14 10*3/uL — ABNORMAL HIGH (ref 4.0–10.5)

## 2013-04-23 LAB — VITAMIN D 25 HYDROXY (VIT D DEFICIENCY, FRACTURES): Vit D, 25-Hydroxy: 40 ng/mL (ref 30–89)

## 2013-04-23 LAB — BASIC METABOLIC PANEL WITH GFR
BUN: 14 mg/dL (ref 6–23)
CHLORIDE: 99 meq/L (ref 96–112)
CO2: 25 meq/L (ref 19–32)
Calcium: 9.5 mg/dL (ref 8.4–10.5)
Creat: 0.8 mg/dL (ref 0.50–1.35)
GFR, Est African American: >89 mL/min
GFR, Est Non African American: >89 mL/min
GLUCOSE: 114 mg/dL — AB (ref 70–99)
POTASSIUM: 4.6 meq/L (ref 3.5–5.3)
SODIUM: 137 meq/L (ref 135–145)

## 2013-04-23 LAB — MAGNESIUM: Magnesium: 2 mg/dL (ref 1.5–2.5)

## 2013-04-23 LAB — LIPID PANEL
Cholesterol: 185 mg/dL (ref 0–200)
HDL: 49 mg/dL (ref >39)
LDL Cholesterol: 111 mg/dL — ABNORMAL HIGH (ref 0–99)
Total CHOL/HDL Ratio: 3.8 Ratio
Triglycerides: 123 mg/dL (ref <150)
VLDL: 25 mg/dL (ref 0–40)

## 2013-04-23 LAB — HEPATIC FUNCTION PANEL
ALBUMIN: 4.6 g/dL (ref 3.5–5.2)
ALK PHOS: 54 U/L (ref 39–117)
ALT: 17 U/L (ref 0–53)
AST: 20 U/L (ref 0–37)
Bilirubin, Direct: 0.3 mg/dL (ref 0.0–0.3)
Indirect Bilirubin: 1 mg/dL (ref 0.2–1.2)
TOTAL PROTEIN: 7.2 g/dL (ref 6.0–8.3)
Total Bilirubin: 1.3 mg/dL — ABNORMAL HIGH (ref 0.2–1.2)

## 2013-04-24 ENCOUNTER — Encounter: Payer: Self-pay | Admitting: Physician Assistant

## 2013-04-24 ENCOUNTER — Ambulatory Visit (INDEPENDENT_AMBULATORY_CARE_PROVIDER_SITE_OTHER): Payer: Managed Care, Other (non HMO) | Admitting: Physician Assistant

## 2013-04-24 ENCOUNTER — Other Ambulatory Visit: Payer: Self-pay | Admitting: Internal Medicine

## 2013-04-24 VITALS — BP 122/84 | HR 80 | Temp 98.1°F | Resp 16 | Ht 74.0 in | Wt 249.0 lb

## 2013-04-24 DIAGNOSIS — L0202 Furuncle of face: Secondary | ICD-10-CM

## 2013-04-24 DIAGNOSIS — K122 Cellulitis and abscess of mouth: Secondary | ICD-10-CM

## 2013-04-24 MED ORDER — SULFAMETHOXAZOLE-TMP DS 800-160 MG PO TABS
1.0000 | ORAL_TABLET | Freq: Two times a day (BID) | ORAL | Status: DC
Start: 2013-04-24 — End: 2013-04-24

## 2013-04-24 MED ORDER — HYDROCODONE-ACETAMINOPHEN 5-325 MG PO TABS: 1.0000 | ORAL_TABLET | Freq: Four times a day (QID) | ORAL | Status: DC | PRN

## 2013-04-24 MED ORDER — SULFAMETHOXAZOLE-TMP DS 800-160 MG PO TABS: 1.0000 | ORAL_TABLET | Freq: Two times a day (BID) | ORAL | Status: DC

## 2013-04-24 NOTE — Patient Instructions (Signed)
Abscess An abscess is an infected area that contains a collection of pus and debris.It can occur in almost any part of the body. An abscess is also known as a furuncle or boil. CAUSES  An abscess occurs when tissue gets infected. This can occur from blockage of oil or sweat glands, infection of hair follicles, or a minor injury to the skin. As the body tries to fight the infection, pus collects in the area and creates pressure under the skin. This pressure causes pain. People with weakened immune systems have difficulty fighting infections and get certain abscesses more often.  SYMPTOMS Usually an abscess develops on the skin and becomes a painful mass that is red, warm, and tender. If the abscess forms under the skin, you may feel a moveable soft area under the skin. Some abscesses break open (rupture) on their own, but most will continue to get worse without care. The infection can spread deeper into the body and eventually into the bloodstream, causing you to feel ill.  DIAGNOSIS  Your caregiver will take your medical history and perform a physical exam. A sample of fluid may also be taken from the abscess to determine what is causing your infection. TREATMENT  Your caregiver may prescribe antibiotic medicines to fight the infection. However, taking antibiotics alone usually does not cure an abscess. Your caregiver may need to make a small cut (incision) in the abscess to drain the pus. In some cases, gauze is packed into the abscess to reduce pain and to continue draining the area. HOME CARE INSTRUCTIONS   Only take over-the-counter or prescription medicines for pain, discomfort, or fever as directed by your caregiver.  If you were prescribed antibiotics, take them as directed. Finish them even if you start to feel better.  If gauze is used, follow your caregiver's directions for changing the gauze.  To avoid spreading the infection:  Keep your draining abscess covered with a  bandage.  Wash your hands well.  Do not share personal care items, towels, or whirlpools with others.  Avoid skin contact with others.  Keep your skin and clothes clean around the abscess.  Keep all follow-up appointments as directed by your caregiver. SEEK MEDICAL CARE IF:   You have increased pain, swelling, redness, fluid drainage, or bleeding.  You have muscle aches, chills, or a general ill feeling.  You have a fever. MAKE SURE YOU:   Understand these instructions.  Will watch your condition.  Will get help right away if you are not doing well or get worse. Document Released: 11/03/2004 Document Revised: 07/26/2011 Document Reviewed: 04/08/2011 [employer] Patient Information 2014 [employer].  

## 2013-04-24 NOTE — Progress Notes (Signed)
Subjective:     Tyler Berger is a 53 y.o. male who presents for evaluation of a probable cutaneous abscess. Lesion is located in the Upper right lip. Onset was 5 days ago. Symptoms have gradually worsened.  Abscess has associated symptoms of pain. Patient does not have previous history of cutaneous abscesses. Patient does not have diabetes.  PMH: HTN, Bicuspid valve  The following portions of the patient's history were reviewed and updated as appropriate: allergies, current medications, past family history, past medical history, past social history, past surgical history and problem list.    Objective:    There is an area characterized by a subcutaneous mass consistent with a cutaneous abscess, fluctuance, tenderness measuring 2 in. in greatest dimension. Location: upper right lip.  Procedure Informed consent obtained.  The area was prepped in the usual manner and the skin overlying the abscess was anesthetized with 2cc of 1% plain lidocaine.  The area was sharply incised and approx 5ccs of purulent material was obtained. Packing was not inserted.    Assessment:      Abscess      Plan:    Apply hot compresses frequently to promote drainage. Oral antibiotics -- see med orders. I & D procedure as above. RTC in 2 days or PRN.

## 2013-04-26 ENCOUNTER — Encounter: Payer: Self-pay | Admitting: Physician Assistant

## 2013-04-26 ENCOUNTER — Ambulatory Visit (INDEPENDENT_AMBULATORY_CARE_PROVIDER_SITE_OTHER): Payer: Managed Care, Other (non HMO) | Admitting: Physician Assistant

## 2013-04-26 VITALS — BP 118/72 | HR 72 | Temp 97.9°F | Resp 16 | Ht 74.0 in | Wt 249.0 lb

## 2013-04-26 DIAGNOSIS — K122 Cellulitis and abscess of mouth: Secondary | ICD-10-CM

## 2013-04-26 NOTE — Progress Notes (Signed)
Patient is here for a follow up for an abscess. It is not as painful, and has decreased in size. Denies nausea, fever, chills, etc. Will continue Bactrim and follow up in 1 week or go to the ER if worse. If it does not get better may need to send to Derm/gen surgeon.

## 2013-04-29 ENCOUNTER — Ambulatory Visit: Payer: Self-pay | Admitting: Cardiology

## 2013-06-18 ENCOUNTER — Ambulatory Visit: Payer: Self-pay | Admitting: Cardiology

## 2013-07-21 ENCOUNTER — Other Ambulatory Visit: Payer: Self-pay | Admitting: Internal Medicine

## 2013-08-20 ENCOUNTER — Ambulatory Visit: Payer: Self-pay | Admitting: Physician Assistant

## 2013-08-25 DIAGNOSIS — Z79899 Other long term (current) drug therapy: Secondary | ICD-10-CM | POA: Insufficient documentation

## 2013-08-25 DIAGNOSIS — R7303 Prediabetes: Secondary | ICD-10-CM | POA: Insufficient documentation

## 2013-08-25 NOTE — Progress Notes (Signed)
Patient ID: Tyler Berger, male   DOB: 19-Feb-1960, 53 y.o.   MRN: [id number]  Assessment and Plan:  Hypertension: Continue medication, monitor blood pressure at home. Continue DASH diet. Cholesterol: Continue diet and exercise. Check cholesterol.  Vitamin D Def- check level and continue medications.  Obesity with co morbidities, BMI 31- long discussion about weight loss, diet, and exercise Bicuspid aortic valve- monitor and suggest follow up  Continue diet and meds as discussed. Further disposition pending results of labs.  HPI 53 y.o. male  presents for 3 month follow up with hypertension, hyperlipidemia,  and vitamin D. His blood pressure has been controlled at home, he has been out of his BP meds for 2 days, today their BP is BP: 122/80 mmHg He does workout, goes to the gym 2 x a week. He denies chest pain, shortness of breath, dizziness.  He is not on cholesterol medication and denies myalgias. His cholesterol is not at goal. The cholesterol last visit was:   Lab Results  Component Value Date   CHOL 185 04/22/2013   HDL 49 04/22/2013   LDLCALC 111* 04/22/2013   TRIG 123 04/22/2013   CHOLHDL 3.8 04/22/2013   Last A1C in the office was: 5.6 Patient is on Vitamin D supplement.   Lab Results  Component Value Date   VD25OH 40 04/22/2013     On celexa normally, and takes xanax as needed for anxiety or sleep. Recently has had increase stressed with one of his kids so has increased his xanax to daily but normally takes as needed.  Has bicuspid aortic valve without recent follow up, suggest follow up, currently asymptomatic will follow up after he gets his insurance straightened out.  Discussed concerning symptoms.  Current Medications:    Medication List       This list is accurate as of: 08/26/13 12:18 PM.  Always use your most recent med list.               ALPRAZolam 0.5 MG tablet  Commonly known as:  XANAX  Take 1 tablet (0.5 mg total) by mouth 2 (two) times daily as needed for  anxiety or sleep.     aspirin 81 MG chewable tablet  Chew by mouth daily.     atenolol 100 MG tablet  Commonly known as:  TENORMIN  TAKE ONE TABLET BY MOUTH EVERY DAY FOR BLOOD PRESSURE     citalopram 20 MG tablet  Commonly known as:  CELEXA  Take 1 tablet (20 mg total) by mouth daily.     dexlansoprazole 60 MG capsule  Commonly known as:  DEXILANT  Take 1 capsule (60 mg total) by mouth daily.     furosemide 80 MG tablet  Commonly known as:  LASIX  Take 1 pill daily for blood pressure     HYDROcodone-acetaminophen 5-325 MG per tablet  Commonly known as:  NORCO  Take 1 tablet by mouth every 6 (six) hours as needed for moderate pain.     lisinopril 20 MG tablet  Commonly known as:  PRINIVIL,ZESTRIL  Take one tablet twice daily     VITAMIN D PO  Take 10,000 Int'l Units by mouth daily.         Medical History:  Past Medical History  Diagnosis Date  . Hyperlipidemia   . Hypertension   . Anxiety   . Vitamin D deficiency   . Viral myocarditis 2004   Allergies: No Known Allergies   Review of Systems: [X]  = complains of  [ ]  =  denies  General: Fatigue [ ]  Fever [ ]  Chills [ ]  Weakness [ ]   Insomnia [ ]  Eyes: Redness [ ]  Blurred vision [ ]  Diplopia [ ]   ENT: Congestion [ ]  Sinus Pain [ ]  Post Nasal Drip [ ]  Sore Throat [ ]  Earache [ ]   Cardiac: Chest pain/pressure [ ]  SOB [ ]  Orthopnea [ ]   Palpitations [ ]   Paroxysmal nocturnal dyspnea[ ]  Claudication [ ]  Edema [ ]   Pulmonary: Cough [ ]  Wheezing[ ]   SOB [ ]   Snoring [ ]   GI: Nausea [ ]  Vomiting[ ]  Dysphagia[ ]  Heartburn[ ]  Abdominal pain [ ]  Constipation [ ] ; Diarrhea [ ] ; BRBPR [ ]  Melena[ ]  GU: Hematuria[ ]  Dysuria [ ]  Nocturia[ ]  Urgency [ ]   Hesitancy [ ]  Discharge [ ]  Neuro: Headaches[ ]  Vertigo[ ]  Paresthesias[ ]  Spasm [ ]  Speech changes [ ]  Incoordination [ ]   Ortho: Arthritis [ ]  Joint pain [ ]  Muscle pain [ ]  Joint swelling [ ]  Back Pain [ ]  Skin:  Rash [ ]   Pruritis [ ]  Change in skin lesion [ ]   Psych:  Depression[ ]  Anxiety[ ]  Confusion [ ]  Memory loss [ ]   Heme/Lypmh: Bleeding [ ]  Bruising [ ]  Enlarged lymph nodes [ ]   Endocrine: Visual blurring [ ]  Paresthesia [ ]  Polyuria [ ]  Polydypsea [ ]    Heat/cold intolerance [ ]  Hypoglycemia [ ]   Family history- Review and unchanged Social history- Review and unchanged Physical Exam: BP 122/80  Pulse 64  Temp(Src) 97.7 F (36.5 C)  Resp 16  Wt 246 lb (111.585 kg) Wt Readings from Last 3 Encounters:  08/26/13 246 lb (111.585 kg)  04/26/13 249 lb (112.946 kg)  04/24/13 249 lb (112.946 kg)  Body mass index is 31.57 kg/(m^2).  General Appearance: Well nourished, in no apparent distress. Eyes: PERRLA, EOMs, conjunctiva no swelling or erythema Sinuses: No Frontal/maxillary tenderness ENT/Mouth: Ext aud canals clear, TMs without erythema, bulging. No erythema, swelling, or exudate on post pharynx.  Tonsils not swollen or erythematous. Hearing normal.  Neck: Supple, thyroid normal.  Respiratory: Respiratory effort normal, BS equal bilaterally without rales, rhonchi, wheezing or stridor.  Cardio: regular irregular rhythm with holosystolic murmur Brisk peripheral pulses with mild bilateral edema and large torturous varicose veins bilateral legs Abdomen: Soft, + BS.  Non tender, no guarding, rebound, hernias, masses. Lymphatics: Non tender without lymphadenopathy.  Musculoskeletal: Full ROM, 5/5 strength, normal gait.  Skin: Warm, dry without rashes, lesions, ecchymosis.  Neuro: Cranial nerves intact. Normal muscle tone, no cerebellar symptoms.  Psych: Awake and oriented X 3, normal affect, Insight and Judgment appropriate.    Vicie Mutters 12:12 PM

## 2013-08-25 NOTE — Patient Instructions (Signed)

## 2013-08-26 ENCOUNTER — Encounter: Payer: Self-pay | Admitting: Internal Medicine

## 2013-08-26 ENCOUNTER — Ambulatory Visit (INDEPENDENT_AMBULATORY_CARE_PROVIDER_SITE_OTHER): Payer: Managed Care, Other (non HMO) | Admitting: Physician Assistant

## 2013-08-26 VITALS — BP 122/80 | HR 64 | Temp 97.7°F | Resp 16 | Wt 246.0 lb

## 2013-08-26 DIAGNOSIS — E559 Vitamin D deficiency, unspecified: Secondary | ICD-10-CM

## 2013-08-26 DIAGNOSIS — I10 Essential (primary) hypertension: Secondary | ICD-10-CM

## 2013-08-26 DIAGNOSIS — Z79899 Other long term (current) drug therapy: Secondary | ICD-10-CM

## 2013-08-26 DIAGNOSIS — R7309 Other abnormal glucose: Secondary | ICD-10-CM

## 2013-08-26 DIAGNOSIS — F411 Generalized anxiety disorder: Secondary | ICD-10-CM

## 2013-08-26 DIAGNOSIS — E785 Hyperlipidemia, unspecified: Secondary | ICD-10-CM

## 2013-08-26 LAB — COMPREHENSIVE METABOLIC PANEL
ALT: 30 U/L (ref 0–53)
AST: 26 U/L (ref 0–37)
Albumin: 4.4 g/dL (ref 3.5–5.2)
Alkaline Phosphatase: 55 U/L (ref 39–117)
BILIRUBIN TOTAL: 0.8 mg/dL (ref 0.2–1.2)
BUN: 15 mg/dL (ref 6–23)
CO2: 24 meq/L (ref 19–32)
CREATININE: 0.81 mg/dL (ref 0.50–1.35)
Calcium: 9.5 mg/dL (ref 8.4–10.5)
Chloride: 102 mEq/L (ref 96–112)
Glucose, Bld: 136 mg/dL — ABNORMAL HIGH (ref 70–99)
Potassium: 4 mEq/L (ref 3.5–5.3)
Sodium: 138 mEq/L (ref 135–145)
Total Protein: 7.3 g/dL (ref 6.0–8.3)

## 2013-08-26 LAB — LIPID PANEL
CHOLESTEROL: 200 mg/dL (ref 0–200)
HDL: 47 mg/dL (ref >39)
LDL Cholesterol: 98 mg/dL (ref 0–99)
TRIGLYCERIDES: 276 mg/dL — AB (ref <150)
Total CHOL/HDL Ratio: 4.3 Ratio
VLDL: 55 mg/dL — ABNORMAL HIGH (ref 0–40)

## 2013-08-26 LAB — CBC WITH DIFFERENTIAL/PLATELET
BASOS PCT: 1 % (ref 0–1)
Basophils Absolute: 0.1 10*3/uL (ref 0.0–0.1)
Eosinophils Absolute: 0.2 10*3/uL (ref 0.0–0.7)
Eosinophils Relative: 2 % (ref 0–5)
HCT: 51.3 % (ref 39.0–52.0)
HEMOGLOBIN: 18.5 g/dL — AB (ref 13.0–17.0)
LYMPHS PCT: 23 % (ref 12–46)
Lymphs Abs: 2 10*3/uL (ref 0.7–4.0)
MCH: 31.8 pg (ref 26.0–34.0)
MCHC: 36.1 g/dL — AB (ref 30.0–36.0)
MCV: 88.1 fL (ref 78.0–100.0)
Monocytes Absolute: 0.9 10*3/uL (ref 0.1–1.0)
Monocytes Relative: 10 % (ref 3–12)
NEUTROS ABS: 5.6 10*3/uL (ref 1.7–7.7)
NEUTROS PCT: 64 % (ref 43–77)
Platelets: 137 10*3/uL — ABNORMAL LOW (ref 150–400)
RBC: 5.82 MIL/uL — ABNORMAL HIGH (ref 4.22–5.81)
RDW: 13.3 % (ref 11.5–15.5)
WBC: 8.8 10*3/uL (ref 4.0–10.5)

## 2013-08-26 MED ORDER — FUROSEMIDE 80 MG PO TABS: ORAL_TABLET | ORAL | Status: DC

## 2013-08-26 MED ORDER — CITALOPRAM HYDROBROMIDE 20 MG PO TABS: 20.0000 mg | ORAL_TABLET | Freq: Every day | ORAL | Status: DC

## 2013-08-26 MED ORDER — LISINOPRIL 20 MG PO TABS: 20.0000 mg | ORAL_TABLET | Freq: Two times a day (BID) | ORAL | Status: DC

## 2013-08-26 MED ORDER — ALPRAZOLAM 0.5 MG PO TABS: 0.5000 mg | ORAL_TABLET | Freq: Two times a day (BID) | ORAL | Status: DC | PRN

## 2013-08-26 MED ORDER — ATENOLOL 100 MG PO TABS: 100.0000 mg | ORAL_TABLET | Freq: Every day | ORAL | Status: DC

## 2013-08-27 LAB — TSH: TSH: 0.838 u[IU]/mL (ref 0.350–4.500)

## 2014-01-29 ENCOUNTER — Other Ambulatory Visit: Payer: Self-pay | Admitting: Physician Assistant

## 2014-03-02 ENCOUNTER — Other Ambulatory Visit: Payer: Self-pay | Admitting: Emergency Medicine

## 2014-03-02 ENCOUNTER — Other Ambulatory Visit: Payer: Self-pay | Admitting: Physician Assistant

## 2014-04-02 ENCOUNTER — Other Ambulatory Visit: Payer: Self-pay | Admitting: Physician Assistant

## 2014-05-04 ENCOUNTER — Other Ambulatory Visit: Payer: Self-pay | Admitting: Internal Medicine

## 2014-05-07 ENCOUNTER — Ambulatory Visit: Payer: Self-pay | Admitting: Physician Assistant

## 2014-05-26 ENCOUNTER — Ambulatory Visit: Payer: Self-pay | Admitting: Physician Assistant

## 2014-06-17 ENCOUNTER — Other Ambulatory Visit: Payer: Self-pay

## 2014-06-17 MED ORDER — LISINOPRIL 20 MG PO TABS: 20.0000 mg | ORAL_TABLET | Freq: Two times a day (BID) | ORAL | Status: DC

## 2014-06-17 MED ORDER — FUROSEMIDE 80 MG PO TABS: ORAL_TABLET | ORAL | Status: DC

## 2014-06-17 MED ORDER — ATENOLOL 100 MG PO TABS: 100.0000 mg | ORAL_TABLET | Freq: Every day | ORAL | Status: DC

## 2014-07-10 ENCOUNTER — Ambulatory Visit (INDEPENDENT_AMBULATORY_CARE_PROVIDER_SITE_OTHER): Payer: Managed Care, Other (non HMO) | Admitting: Internal Medicine

## 2014-07-10 ENCOUNTER — Encounter: Payer: Self-pay | Admitting: Internal Medicine

## 2014-07-10 VITALS — BP 122/70 | HR 96 | Temp 98.0°F | Resp 18 | Ht 74.0 in | Wt 256.0 lb

## 2014-07-10 DIAGNOSIS — Z79899 Other long term (current) drug therapy: Secondary | ICD-10-CM

## 2014-07-10 DIAGNOSIS — F411 Generalized anxiety disorder: Secondary | ICD-10-CM

## 2014-07-10 DIAGNOSIS — F419 Anxiety disorder, unspecified: Secondary | ICD-10-CM

## 2014-07-10 DIAGNOSIS — E785 Hyperlipidemia, unspecified: Secondary | ICD-10-CM

## 2014-07-10 DIAGNOSIS — R7303 Prediabetes: Secondary | ICD-10-CM

## 2014-07-10 DIAGNOSIS — R7309 Other abnormal glucose: Secondary | ICD-10-CM

## 2014-07-10 DIAGNOSIS — I10 Essential (primary) hypertension: Secondary | ICD-10-CM

## 2014-07-10 DIAGNOSIS — E559 Vitamin D deficiency, unspecified: Secondary | ICD-10-CM

## 2014-07-10 MED ORDER — ATENOLOL 100 MG PO TABS: 100.0000 mg | ORAL_TABLET | Freq: Every day | ORAL | Status: DC

## 2014-07-10 MED ORDER — FUROSEMIDE 80 MG PO TABS: ORAL_TABLET | ORAL | Status: DC

## 2014-07-10 MED ORDER — LISINOPRIL 20 MG PO TABS: 20.0000 mg | ORAL_TABLET | Freq: Two times a day (BID) | ORAL | Status: DC

## 2014-07-10 MED ORDER — ALPRAZOLAM 0.5 MG PO TABS: 0.5000 mg | ORAL_TABLET | Freq: Two times a day (BID) | ORAL | Status: AC | PRN

## 2014-07-10 NOTE — Progress Notes (Signed)
Patient ID: Tyler Berger, male   DOB: 02-03-1961, 54 y.o.   MRN: [id number]  Assessment and Plan:  Hypertension:  -check BMP only secondary to cost concerns -Continue medication,  -monitor blood pressure at home.  -Continue DASH diet.   -Reminder to go to the ER if any CP, SOB, nausea, dizziness, severe HA, changes vision/speech, left arm numbness and tingling, and jaw pain.  Cholesterol: -Continue diet and exercise.   Pre-diabetes: -Continue diet and exercise.  -Check A1C  Vitamin D Def: -continue medications.   Continue diet and meds as discussed. Further disposition pending results of labs.  HPI 54 y.o. male  presents for 3 month follow up with hypertension, hyperlipidemia, prediabetes and vitamin D.   His blood pressure has been controlled at home, today their BP is BP: 122/70 mmHg.   He does workout. He denies chest pain, shortness of breath, dizziness.   He is not on cholesterol medication and denies myalgias. His cholesterol is not at goal. The cholesterol last visit was:   Lab Results  Component Value Date   CHOL 200 08/26/2013   HDL 47 08/26/2013   LDLCALC 98 08/26/2013   TRIG 276* 08/26/2013   CHOLHDL 4.3 08/26/2013     He has been working on diet and exercise for prediabetes, and denies foot ulcerations, hyperglycemia, hypoglycemia , increased appetite, nausea, paresthesia of the feet, polydipsia, polyuria, visual disturbances, vomiting and weight loss. Last A1C in the office was: No results found for: HGBA1C  Patient is on Vitamin D supplement.  Lab Results  Component Value Date   VD25OH 40 04/22/2013      Patient reports that he has had to use his xanax a little bit more lately secondary to his mother passing away but otherwise he has been doing well.  Current Medications:  Current Outpatient Prescriptions on File Prior to Visit  Medication Sig Dispense Refill  . ALPRAZolam (XANAX) 0.5 MG tablet Take 1 tablet (0.5 mg total) by mouth 2 (two) times daily as  needed for anxiety or sleep. 60 tablet 1  . aspirin 81 MG chewable tablet Chew by mouth daily.    Marland Kitchen atenolol (TENORMIN) 100 MG tablet Take 1 tablet (100 mg total) by mouth daily. 30 tablet 0  . furosemide (LASIX) 80 MG tablet TAKE ONE-HALF TO ONE TABLET BY MOUTH ONCE DAILY FOR BLOOD PRESSURE AND  FLUID 30 tablet 0  . lisinopril (PRINIVIL,ZESTRIL) 20 MG tablet Take 1 tablet (20 mg total) by mouth 2 (two) times daily. 60 tablet 0   No current facility-administered medications on file prior to visit.    Medical History:  Past Medical History  Diagnosis Date  . Hyperlipidemia   . Hypertension   . Anxiety   . Vitamin D deficiency   . Viral myocarditis 2004    Allergies: No Known Allergies   Review of Systems:  Review of Systems  Constitutional: Negative for fever, chills, weight loss and malaise/fatigue.  HENT: Negative for congestion, ear pain and sore throat.   Eyes: Negative for blurred vision and double vision.  Respiratory: Negative for cough, shortness of breath and wheezing.   Cardiovascular: Negative for chest pain, palpitations and leg swelling.  Gastrointestinal: Negative for heartburn, nausea, vomiting, diarrhea, constipation, blood in stool and melena.  Genitourinary: Negative for dysuria, urgency and frequency.  Skin: Negative.   Neurological: Negative for dizziness, sensory change and headaches.  Psychiatric/Behavioral: Negative for depression. The patient is not nervous/anxious and does not have insomnia.     Family history-  Review and unchanged  Social history- Review and unchanged  Physical Exam: BP 122/70 mmHg  Pulse 96  Temp(Src) 98 F (36.7 C) (Temporal)  Resp 18  Ht 6\' 2"  (1.88 m)  Wt 256 lb (116.121 kg)  BMI 32.85 kg/m2 Wt Readings from Last 3 Encounters:  07/10/14 256 lb (116.121 kg)  08/26/13 246 lb (111.585 kg)  04/26/13 249 lb (112.946 kg)    General Appearance: Well nourished well developed, in no apparent distress. Eyes: PERRLA, EOMs,  conjunctiva no swelling or erythema ENT/Mouth: Ear canals normal without obstruction, swelling, erythma, discharge.  TMs normal bilaterally.  Oropharynx moist, clear, without exudate, or postoropharyngeal swelling. Neck: Supple, thyroid normal,no cervical adenopathy  Respiratory: Respiratory effort normal, Breath sounds clear A&P without rhonchi, wheeze, or rale.  No retractions, no accessory usage. Cardio: RRR with no MRGs. Brisk peripheral pulses without edema.  Abdomen: Soft, + BS,  Non tender, no guarding, rebound, hernias, masses. Musculoskeletal: Full ROM, 5/5 strength, Normal gait Skin: Warm, dry without rashes, lesions, ecchymosis.  Neuro: Awake and oriented X 3, Cranial nerves intact. Normal muscle tone, no cerebellar symptoms. Psych: Normal affect, Insight and Judgment appropriate.    FORCUCCI, Jacobs Golab, PA-C 4:52 PM [facility]

## 2014-07-11 LAB — BASIC METABOLIC PANEL WITH GFR
BUN: 21 mg/dL (ref 6–23)
CO2: 23 meq/L (ref 19–32)
Calcium: 9.1 mg/dL (ref 8.4–10.5)
Chloride: 106 mEq/L (ref 96–112)
Creat: 0.93 mg/dL (ref 0.50–1.35)
Glucose, Bld: 124 mg/dL — ABNORMAL HIGH (ref 70–99)
Potassium: 4 mEq/L (ref 3.5–5.3)
Sodium: 138 mEq/L (ref 135–145)

## 2014-07-16 ENCOUNTER — Other Ambulatory Visit: Payer: Self-pay | Admitting: Internal Medicine

## 2014-10-18 ENCOUNTER — Other Ambulatory Visit: Payer: Self-pay | Admitting: Internal Medicine

## 2014-12-25 ENCOUNTER — Other Ambulatory Visit: Payer: Self-pay | Admitting: Internal Medicine

## 2015-01-27 ENCOUNTER — Other Ambulatory Visit: Payer: Self-pay | Admitting: Internal Medicine

## 2015-01-27 DIAGNOSIS — I10 Essential (primary) hypertension: Secondary | ICD-10-CM

## 2015-05-06 ENCOUNTER — Other Ambulatory Visit: Payer: Self-pay | Admitting: Physician Assistant

## 2015-06-26 ENCOUNTER — Other Ambulatory Visit: Payer: Self-pay | Admitting: Internal Medicine

## 2015-08-03 ENCOUNTER — Other Ambulatory Visit: Payer: Self-pay | Admitting: Physician Assistant

## 2015-08-03 ENCOUNTER — Other Ambulatory Visit: Payer: Self-pay | Admitting: Internal Medicine

## 2015-08-03 DIAGNOSIS — R609 Edema, unspecified: Secondary | ICD-10-CM

## 2015-08-03 DIAGNOSIS — I10 Essential (primary) hypertension: Secondary | ICD-10-CM

## 2015-08-04 ENCOUNTER — Other Ambulatory Visit: Payer: Self-pay | Admitting: *Deleted

## 2015-08-04 DIAGNOSIS — R609 Edema, unspecified: Secondary | ICD-10-CM

## 2015-08-04 DIAGNOSIS — I10 Essential (primary) hypertension: Secondary | ICD-10-CM

## 2015-08-04 MED ORDER — FUROSEMIDE 80 MG PO TABS: ORAL_TABLET | ORAL | Status: DC

## 2015-08-04 MED ORDER — ATENOLOL 100 MG PO TABS: 100.0000 mg | ORAL_TABLET | Freq: Every day | ORAL | Status: DC

## 2015-08-05 ENCOUNTER — Other Ambulatory Visit: Payer: Self-pay | Admitting: *Deleted

## 2015-08-05 DIAGNOSIS — I10 Essential (primary) hypertension: Secondary | ICD-10-CM

## 2015-08-05 MED ORDER — ATENOLOL 100 MG PO TABS: 100.0000 mg | ORAL_TABLET | Freq: Every day | ORAL | Status: DC

## 2015-09-18 ENCOUNTER — Ambulatory Visit: Payer: Self-pay | Admitting: Internal Medicine

## 2016-02-06 ENCOUNTER — Other Ambulatory Visit: Payer: Self-pay | Admitting: Internal Medicine

## 2016-05-07 ENCOUNTER — Other Ambulatory Visit: Payer: Self-pay | Admitting: Internal Medicine

## 2016-05-07 DIAGNOSIS — R609 Edema, unspecified: Secondary | ICD-10-CM

## 2016-05-07 DIAGNOSIS — I10 Essential (primary) hypertension: Secondary | ICD-10-CM

## 2016-05-10 ENCOUNTER — Other Ambulatory Visit: Payer: Self-pay | Admitting: *Deleted

## 2016-05-10 DIAGNOSIS — R609 Edema, unspecified: Secondary | ICD-10-CM

## 2016-05-10 DIAGNOSIS — I10 Essential (primary) hypertension: Secondary | ICD-10-CM

## 2016-05-10 MED ORDER — FUROSEMIDE 80 MG PO TABS: ORAL_TABLET | ORAL | 0 refills | Status: DC

## 2016-05-31 NOTE — Progress Notes (Signed)
Patient ID: Raymon Schlarb, male   DOB: October 03, 1960, 56 y.o.   MRN: [id number]  Assessment and Plan:  Hypertension: Continue medication, monitor blood pressure at home. Continue DASH diet. Cholesterol: Continue diet and exercise. Check cholesterol.  Vitamin D Def- check level and continue medications.  Obesity with co morbidities, has stopped meat, doing great with weight loss Bicuspid aortic valve- monitor and suggest follow up  Will follow up 1 year, declines follow up due to insurance Continue diet and meds as discussed. Further disposition pending results of labs.  HPI 56 y.o. male  presents for 3 month follow up with hypertension, hyperlipidemia,  and vitamin D. His blood pressure has been controlled at home, he has been out of his BP meds for 2 days, today their BP is BP: 130/80 He does workout, goes to the gym 2 x a week. He denies chest pain, shortness of breath, dizziness.  He is not on cholesterol medication and denies myalgias. His cholesterol is not at goal. The cholesterol last visit was:   Lab Results  Component Value Date   CHOL 200 08/26/2013   HDL 47 08/26/2013   LDLCALC 98 08/26/2013   TRIG 276 (H) 08/26/2013   CHOLHDL 4.3 08/26/2013   Last A1C in the office was: 5.6 He does not eat beef.  Patient is on Vitamin D supplement.   Lab Results  Component Value Date   VD25OH 40 04/22/2013     Has bicuspid aortic valve without recent follow up, suggest follow up, currently asymptomatic will follow up if he has issues due to poor insurance.   BMI is Body mass index is 29.4 kg/m., he is working on diet and exercise. Wt Readings from Last 3 Encounters:  06/01/16 229 lb (103.9 kg)  07/10/14 256 lb (116.1 kg)  08/26/13 246 lb (111.6 kg)    Current Medications:  Current Outpatient Prescriptions on File Prior to Visit  Medication Sig  . aspirin 81 MG chewable tablet Chew by mouth daily.  Marland Kitchen atenolol (TENORMIN) 100 MG tablet Take 1 tablet (100 mg total) by mouth daily.  .  Cholecalciferol (VITAMIN D PO) Take 10,000 Units by mouth daily.  . furosemide (LASIX) 80 MG tablet TAKE ONE-HALF TO ONE TABLET BY MOUTH ONCE DAILY FOR BLOOD PRESSURE AND  FLUID  . lisinopril (PRINIVIL,ZESTRIL) 20 MG tablet TAKE ONE TABLET BY MOUTH TWICE DAILY   No current facility-administered medications on file prior to visit.     Medical History:  Past Medical History:  Diagnosis Date  . Anxiety   . Hyperlipidemia   . Hypertension   . Viral myocarditis 2004  . Vitamin D deficiency    Allergies: No Known Allergies   ROS   Family history- Review and unchanged Social history- Review and unchanged Physical Exam: BP 130/80   Pulse 67   Temp 97.7 F (36.5 C)   Resp 16   Ht 6\' 2"  (1.88 m)   Wt 229 lb (103.9 kg)   SpO2 97%   BMI 29.40 kg/m  Wt Readings from Last 3 Encounters:  06/01/16 229 lb (103.9 kg)  07/10/14 256 lb (116.1 kg)  08/26/13 246 lb (111.6 kg)  Body mass index is 29.4 kg/m.  General Appearance: Well nourished, in no apparent distress. Eyes: PERRLA, EOMs, conjunctiva no swelling or erythema Sinuses: No Frontal/maxillary tenderness ENT/Mouth: Ext aud canals clear, TMs without erythema, bulging. No erythema, swelling, or exudate on post pharynx.  Tonsils not swollen or erythematous. Hearing normal.  Neck: Supple, thyroid normal.  Respiratory: Respiratory effort normal, BS equal bilaterally without rales, rhonchi, wheezing or stridor.  Cardio: regular irregular rhythm with holosystolic murmur Brisk peripheral pulses with mild bilateral edema and large torturous varicose veins bilateral legs Abdomen: Soft, + BS.  Non tender, no guarding, rebound, hernias, masses. Lymphatics: Non tender without lymphadenopathy.  Musculoskeletal: Full ROM, 5/5 strength, normal gait.  Skin: Warm, dry without rashes, lesions, ecchymosis.  Neuro: Cranial nerves intact. Normal muscle tone, no cerebellar symptoms.  Psych: Awake and oriented X 3, normal affect, Insight and Judgment  appropriate.    Vicie Mutters 3:59 PM

## 2016-06-01 ENCOUNTER — Ambulatory Visit: Payer: Self-pay | Admitting: Internal Medicine

## 2016-06-01 ENCOUNTER — Ambulatory Visit (INDEPENDENT_AMBULATORY_CARE_PROVIDER_SITE_OTHER): Payer: Managed Care, Other (non HMO) | Admitting: Physician Assistant

## 2016-06-01 ENCOUNTER — Encounter: Payer: Self-pay | Admitting: Physician Assistant

## 2016-06-01 VITALS — BP 130/80 | HR 67 | Temp 97.7°F | Resp 16 | Ht 74.0 in | Wt 229.0 lb

## 2016-06-01 DIAGNOSIS — I10 Essential (primary) hypertension: Secondary | ICD-10-CM | POA: Diagnosis not present

## 2016-06-01 DIAGNOSIS — I8393 Asymptomatic varicose veins of bilateral lower extremities: Secondary | ICD-10-CM

## 2016-06-01 DIAGNOSIS — E559 Vitamin D deficiency, unspecified: Secondary | ICD-10-CM | POA: Diagnosis not present

## 2016-06-01 DIAGNOSIS — R609 Edema, unspecified: Secondary | ICD-10-CM

## 2016-06-01 DIAGNOSIS — E785 Hyperlipidemia, unspecified: Secondary | ICD-10-CM

## 2016-06-01 DIAGNOSIS — Z79899 Other long term (current) drug therapy: Secondary | ICD-10-CM | POA: Diagnosis not present

## 2016-06-01 DIAGNOSIS — R7303 Prediabetes: Secondary | ICD-10-CM

## 2016-06-01 MED ORDER — LISINOPRIL 20 MG PO TABS: 20.0000 mg | ORAL_TABLET | Freq: Two times a day (BID) | ORAL | 11 refills | Status: DC

## 2016-06-01 MED ORDER — FUROSEMIDE 80 MG PO TABS: ORAL_TABLET | ORAL | 11 refills | Status: DC

## 2016-06-01 MED ORDER — ATENOLOL 100 MG PO TABS: 100.0000 mg | ORAL_TABLET | Freq: Every day | ORAL | 11 refills | Status: DC

## 2016-06-01 NOTE — Patient Instructions (Signed)
Varicose Veins Varicose veins are veins that have become enlarged and twisted. CAUSES This condition is the result of valves in the veins not working properly. Valves in the veins help return blood from the leg to the heart. When your calf muscles squeeze, the blood moves up your leg then the valves close and this continues until the blood gets back to your heart.  If these valves are damaged, blood flows backwards and backs up into the veins in the leg near the skin OR if your are sitting/standing for a long time without using your calf muscles the blood will back up into the veins in your legs. This causes the veins to become larger. People who are on their feet a lot, sit a lot without walking (like on a plane, at a desk, or in a car), who are pregnant, or who are overweight are more likely to develop varicose veins. SYMPTOMS   Bulging, twisted-appearing, bluish veins, most commonly found on the legs.  Leg pain or a feeling of heaviness. These symptoms may be worse at the end of the day.  Leg swelling.  Skin color changes. DIAGNOSIS  Varicose veins can usually be diagnosed with an exam of your legs by your caregiver. He or she may recommend an ultrasound of your leg veins. TREATMENT  Most varicose veins can be treated at home.However, other treatments are available for people who have persistent symptoms or who want to treat the cosmetic appearance of the varicose veins. But this is only cosmetic and they will return if not properly treated. These include:  Laser treatment of very small varicose veins.  Medicine that is shot (injected) into the vein. This medicine hardens the walls of the vein and closes off the vein. This treatment is called sclerotherapy. Afterwards, you may need to wear clothing or bandages that apply pressure.  Surgery. HOME CARE INSTRUCTIONS   Do not stand or sit in one position for long periods of time. Do not sit with your legs crossed. Rest with your legs raised  during the day.  Your legs have to be higher than your heart so that gravity will force the valves to open, so please really elevate your legs.   Wear elastic stockings or support hose. Do not wear other tight, encircling garments around the legs, pelvis, or waist.  [employer]  has a wide variety of well priced compression stockings. [place], [place] [address] [id number] [place] ARE COPPER INFUSED COMPRESSION SOCKS AT [facility] OR [facility]  Walk as much as possible to increase blood flow.  Raise the foot of your bed at night with 2-inch blocks.  If you get a cut in the skin over the vein and the vein bleeds, lie down with your leg raised and press on it with a clean cloth until the bleeding stops. Then place a bandage (dressing) on the cut. See your caregiver if it continues to bleed or needs stitches. SEEK MEDICAL CARE IF:   The skin around your ankle starts to break down.  You have pain, redness, tenderness, or hard swelling developing in your leg over a vein.  You are uncomfortable due to leg pain. Document Released: 11/03/2004 Document Revised: 04/18/2011 Document Reviewed: 03/22/2010 [facility] Patient Information 2014 [place].   Cologuard is an easy to use noninvasive colon cancer screening test based on the latest advances in stool DNA science.   Colon cancer is 3rd most diagnosed cancer and 2nd leading cause of death  in both men and women 80 years of age and older despite being one of the most preventable and treatable cancers if found early.  4 of out 5 people diagnosed with colon cancer have NO prior family history.  When caught EARLY 90% of colon cancer is curable.   You have agreed to do a Cologuard screening and have declined a colonoscopy in spite of being explained the risks and benefits of the colonoscopy in detail, including cancer and death. Please understand that this is test not as sensitive or specific as a colonoscopy and you are still  recommended to get a colonoscopy.   If you are NOT medicare please call your insurance company and give them these items to see if they will cover it: 1) CPT code, [phone] 2) Provider is Probation officer 3) [employer] NPI [id number] 4) [place] [employer] ID [id number]  Out-of-pocket cost for Cologuard can range from $0 - $649 so please call  You will receive a short call from [place] support center at [employer], when you receive a call they will say they are from [place],  to confirm your mailing address and give you more information.  When they calll you, it will appear on the caller ID as "[employer]" or in some cases only this number will appear, [phone].   [employer] will ship your collection kit directly to you. You will collect a single stool sample in the privacy of your own home, no special preparation required. You will return the kit via [place] pre-paid shipping or pick-up, in the same box it arrived in. Then I will contact you to discuss your results after I receive them from the laboratory.   If you have any questions or concerns, Cologuard Customer Support Specialist are available 24 hours a day, 7 days a week at [phone] or go to [email or web address].

## 2016-06-02 LAB — COMPREHENSIVE METABOLIC PANEL
ALBUMIN: 4.5 g/dL (ref 3.6–5.1)
ALT: 17 U/L (ref 9–46)
AST: 18 U/L (ref 10–35)
Alkaline Phosphatase: 45 U/L (ref 40–115)
BILIRUBIN TOTAL: 0.8 mg/dL (ref 0.2–1.2)
BUN: 19 mg/dL (ref 7–25)
CHLORIDE: 103 mmol/L (ref 98–110)
CO2: 22 mmol/L (ref 20–31)
Calcium: 9.3 mg/dL (ref 8.6–10.3)
Creat: 0.92 mg/dL (ref 0.70–1.33)
Glucose, Bld: 131 mg/dL — ABNORMAL HIGH (ref 65–99)
POTASSIUM: 4.1 mmol/L (ref 3.5–5.3)
Sodium: 139 mmol/L (ref 135–146)
Total Protein: 7 g/dL (ref 6.1–8.1)

## 2016-06-02 NOTE — Progress Notes (Signed)
Pt aware of lab results & voiced understanding of those results.

## 2017-05-30 NOTE — Progress Notes (Signed)
Patient ID: Tyler Berger, male   DOB: 12/15/1960, 57 y.o.   MRN: [id number] COMPLETE PHYSICAL  Assessment and Plan:   Essential hypertension - continue medications, DASH diet, exercise and monitor at home. Call if greater than 130/80.  -     DG Chest 2 View; Future -     EKG 12-Lead  Hyperlipidemia, unspecified hyperlipidemia type -continue medications, check lipids, decrease fatty foods, increase activity. '\ -     Lipid panel  Medication management -     CBC with Differential/Platelet -     COMPLETE METABOLIC PANEL WITH GFR  Vitamin D deficiency Continue supplement  Anxiety -continue medications, stress management techniques discussed, increase water, good sleep hygiene discussed, increase exercise, and increase veggies.   Varicose veins of both lower extremities, unspecified whether complicated Varicose veins - weight loss discussed, continue compression stockings and elevation  BMI 30.0-30.9,adult  Overweight  - long discussion about weight loss, diet, and exercise -recommended diet heavy in fruits and veggies and low in animal meats, cheeses, and dairy products  Need for diphtheria-tetanus-pertussis (Tdap) vaccine -     Tdap vaccine greater than or equal to 7yo IM  Bicuspid aortic valve LONG DISCUSSION THAT PATIENT WILL NEED IMAGING AT SOME POINT BICUSPID VALVE UNTREATED CAN CAUSE OTHER ISSUES WITH THE HEART NO SYMPTOMS AT THIS TIME BUT OBVIOUS CARDIOMYOPATHY FROM EXAM/EKG WILL THINK ABOUT IT  Encounter for general adult medical examination with abnormal findings -     DG Chest 2 View; Future -     EKG 12-Lead  Smoking cessation -  instruction/counseling given, counseled patient on the dangers of tobacco use, advised patient to stop smoking, and reviewed strategies to maximize success, patient not ready to quit at this time.    Continue diet and meds as discussed. Further disposition pending results of labs.  HPI 57 y.o. male  presents for 3 month follow up with  hypertension, hyperlipidemia,  and vitamin D. His blood pressure has been controlled at home, he has been out of his BP meds for 2 days, today their BP is BP: 122/86 He does workout, goes to the gym 2 x a week. He denies chest pain, shortness of breath, dizziness.  He is not on cholesterol medication and denies myalgias. His cholesterol is not at goal. The cholesterol last visit was:   Lab Results  Component Value Date   CHOL 200 08/26/2013   HDL 47 08/26/2013   LDLCALC 98 08/26/2013   TRIG 276 (H) 08/26/2013   CHOLHDL 4.3 08/26/2013   Last A1C in the office was: 5.6 He does not eat beef.  Patient is on Vitamin D supplement.   Lab Results  Component Value Date   VD25OH 40 04/22/2013     Has bicuspid aortic valve, last echo 2008, without recent follow up, suggest follow up, currently asymptomatic, occ ankle swelling but worse if on his feet all day. Declines follow up due to insurance.  BMI is Body mass index is 30.53 kg/m., he is working on diet and exercise. Wt Readings from Last 3 Encounters:  06/01/17 237 lb 12.8 oz (107.9 kg)  06/01/16 229 lb (103.9 kg)  07/10/14 256 lb (116.1 kg)    Current Medications:  Current Outpatient Medications on File Prior to Visit  Medication Sig  . ALPRAZolam (XANAX) 0.5 MG tablet Take 0.5 mg by mouth at bedtime as needed for anxiety.  Marland Kitchen aspirin 81 MG chewable tablet Chew by mouth daily.  Marland Kitchen atenolol (TENORMIN) 100 MG tablet Take 1  tablet (100 mg total) by mouth daily.  . Cholecalciferol (VITAMIN D PO) Take 10,000 Units by mouth daily.  . furosemide (LASIX) 80 MG tablet TAKE ONE-HALF TO ONE TABLET BY MOUTH ONCE DAILY FOR BLOOD PRESSURE AND  FLUID  . lisinopril (PRINIVIL,ZESTRIL) 20 MG tablet Take 1 tablet (20 mg total) by mouth 2 (two) times daily.   No current facility-administered medications on file prior to visit.     Medical History:  Past Medical History:  Diagnosis Date  . Anxiety   . Hyperlipidemia   . Hypertension   . Viral  myocarditis 2004  . Vitamin D deficiency    Allergies No Known Allergies  Immunization History  Administered Date(s) Administered  . Influenza-Unspecified 12/05/2016  . Pneumococcal-Unspecified 02/08/1992  . Td 02/07/1997  . Tdap 06/01/2017   Colonoscopy: will consider it CXR 2008 Echo 2008 Stress test 2008  SURGICAL HISTORY He  has a past surgical history that includes Hernia repair (Left, 2010); Orchiectomy (Left, 1982); and Varicose vein surgery (Left, 05/1998). FAMILY HISTORY His family history includes Alzheimer's disease in his mother; Cancer in his father and mother; Diabetes in his mother; Hypertension in his father, mother, and sister. SOCIAL HISTORY He  reports that he has been smoking cigarettes.  He has a 15.00 pack-year smoking history. He has never used smokeless tobacco.   Review of Systems  Constitutional: Negative.   HENT: Negative.   Eyes: Negative.   Respiratory: Negative.   Cardiovascular: Negative.   Gastrointestinal: Negative.   Genitourinary: Negative.   Musculoskeletal: Negative.   Skin: Negative.   Neurological: Negative.   Endo/Heme/Allergies: Negative.   Psychiatric/Behavioral: Negative.    Physical Exam: BP 122/86   Pulse 66   Temp 97.6 F (36.4 C)   Resp 16   Ht 6\' 2"  (1.88 m)   Wt 237 lb 12.8 oz (107.9 kg)   SpO2 97%   BMI 30.53 kg/m  Wt Readings from Last 3 Encounters:  06/01/17 237 lb 12.8 oz (107.9 kg)  06/01/16 229 lb (103.9 kg)  07/10/14 256 lb (116.1 kg)  Body mass index is 30.53 kg/m.  General Appearance: Well nourished, in no apparent distress. Eyes: PERRLA, EOMs, conjunctiva no swelling or erythema Sinuses: No Frontal/maxillary tenderness ENT/Mouth: Ext aud canals clear, TMs without erythema, bulging. No erythema, swelling, or exudate on post pharynx.  Tonsils not swollen or erythematous. Hearing normal.  Neck: Supple, thyroid normal.  Respiratory: Respiratory effort normal, BS equal bilaterally without rales,  rhonchi, wheezing or stridor.  Cardio: regular irregular rhythm with holosystolic murmur 3/6 with radiation into carotids Brisk peripheral pulses with mild bilateral edema and large torturous varicose veins bilateral legs with venous stasis Abdomen: Soft, + BS.  Non tender, no guarding, rebound, hernias, masses. Lymphatics: Non tender without lymphadenopathy.  Musculoskeletal: Full ROM, 5/5 strength, normal gait.  Skin: Warm, dry without rashes, lesions, ecchymosis.  Neuro: Cranial nerves intact. Normal muscle tone, no cerebellar symptoms.  Psych: Awake and oriented X 3, normal affect, Insight and Judgment appropriate.    Vicie Mutters 3:15 PM

## 2017-06-01 ENCOUNTER — Ambulatory Visit (INDEPENDENT_AMBULATORY_CARE_PROVIDER_SITE_OTHER): Payer: Managed Care, Other (non HMO) | Admitting: Physician Assistant

## 2017-06-01 VITALS — BP 122/86 | HR 66 | Temp 97.6°F | Resp 16 | Ht 74.0 in | Wt 237.8 lb

## 2017-06-01 DIAGNOSIS — E785 Hyperlipidemia, unspecified: Secondary | ICD-10-CM

## 2017-06-01 DIAGNOSIS — Z23 Encounter for immunization: Secondary | ICD-10-CM

## 2017-06-01 DIAGNOSIS — I10 Essential (primary) hypertension: Secondary | ICD-10-CM

## 2017-06-01 DIAGNOSIS — Z79899 Other long term (current) drug therapy: Secondary | ICD-10-CM

## 2017-06-01 DIAGNOSIS — E559 Vitamin D deficiency, unspecified: Secondary | ICD-10-CM

## 2017-06-01 DIAGNOSIS — Z0000 Encounter for general adult medical examination without abnormal findings: Secondary | ICD-10-CM | POA: Diagnosis not present

## 2017-06-01 DIAGNOSIS — Z13220 Encounter for screening for lipoid disorders: Secondary | ICD-10-CM | POA: Diagnosis not present

## 2017-06-01 DIAGNOSIS — I447 Left bundle-branch block, unspecified: Secondary | ICD-10-CM | POA: Insufficient documentation

## 2017-06-01 DIAGNOSIS — F419 Anxiety disorder, unspecified: Secondary | ICD-10-CM

## 2017-06-01 DIAGNOSIS — Z0001 Encounter for general adult medical examination with abnormal findings: Secondary | ICD-10-CM

## 2017-06-01 DIAGNOSIS — Z720 Tobacco use: Secondary | ICD-10-CM

## 2017-06-01 DIAGNOSIS — Z6830 Body mass index (BMI) 30.0-30.9, adult: Secondary | ICD-10-CM

## 2017-06-01 DIAGNOSIS — R609 Edema, unspecified: Secondary | ICD-10-CM

## 2017-06-01 DIAGNOSIS — Q231 Congenital insufficiency of aortic valve: Secondary | ICD-10-CM

## 2017-06-01 DIAGNOSIS — I8393 Asymptomatic varicose veins of bilateral lower extremities: Secondary | ICD-10-CM

## 2017-06-01 DIAGNOSIS — Q2381 Bicuspid aortic valve: Secondary | ICD-10-CM

## 2017-06-01 MED ORDER — FUROSEMIDE 80 MG PO TABS: ORAL_TABLET | ORAL | 11 refills | Status: DC

## 2017-06-01 MED ORDER — LISINOPRIL 20 MG PO TABS: 20.0000 mg | ORAL_TABLET | Freq: Two times a day (BID) | ORAL | 11 refills | Status: DC

## 2017-06-01 MED ORDER — ATENOLOL 100 MG PO TABS: 100.0000 mg | ORAL_TABLET | Freq: Every day | ORAL | 11 refills | Status: DC

## 2017-06-01 MED ORDER — ALPRAZOLAM 0.5 MG PO TABS: 0.5000 mg | ORAL_TABLET | Freq: Every evening | ORAL | 1 refills | Status: DC | PRN

## 2017-06-01 NOTE — Patient Instructions (Addendum)
[facility] [place] go to radiology and give them your name. They will have the order and take you back. You do not any paper work, I should get the result back today or tomorrow. This order is good for a year.    Cologuard is an easy to use noninvasive colon cancer screening test based on the latest advances in stool DNA science.   Colon cancer is 3rd most diagnosed cancer and 2nd leading cause of death in both men and women 58 years of age and older despite being one of the most preventable and treatable cancers if found early.  4 of out 5 people diagnosed with colon cancer have NO prior family history.  When caught EARLY 90% of colon cancer is curable.   You have agreed to do a Cologuard screening and have declined a colonoscopy in spite of being explained the risks and benefits of the colonoscopy in detail, including cancer and death. Please understand that this is test not as sensitive or specific as a colonoscopy and you are still recommended to get a colonoscopy.   If you are NOT medicare please call your insurance company and give them these items to see if they will cover it: 1) CPT code, [id number] 2) Provider is Probation officer 3) [employer] NPI [id number] 4) [place] Tax ID [phone]  Out-of-pocket cost for Cologuard can range from $0 - $649 so please call  You will receive a short call from [facility] support center at [employer], when you receive a call they will say they are from [place],  to confirm your mailing address and give you more information.  When they calll you, it will appear on the caller ID as "[employer]" or in some cases only this number will appear, [phone].   [employer] will ship your collection kit directly to you. You will collect a single stool sample in the privacy of your own home, no  Aortic Valve Stenosis Aortic valve stenosis is a narrowing of the aortic valve. The  aortic valve opens and closes to regulate blood flow between the lower left chamber of the heart (left ventricle) and the blood vessel that leads away from the heart (aorta). When the aortic valve becomes narrow, it makes it difficult for the heart to pump blood into the aorta, which causes the heart to work harder. The extra work can weaken the heart over time. Aortic valve stenosis can range from mild to severe. If untreated, it can become more severe over time and can lead to heart failure. What are the causes? This condition may be caused by:  Buildup of calcium around and on the valve. This can occur with aging. This is the most common cause of aortic valve stenosis.  Birth defect.  Rheumatic fever.  Radiation to the chest.  What increases the risk? You may be more likely to develop this condition if:  You are over the age of 33.  You were born with an abnormal bicuspid valve.  What are the signs or symptoms? You may have no symptoms until your condition becomes severe. It may take 10-20 years for mild or moderate aortic valve stenosis to become severe. Symptoms may include:  Shortness of breath. This may get worse during physical activity.  Feeling unusually weak and tired (fatigue).  Extreme discomfort in the chest, neck, or arm (angina).  A heartbeat that is irregular or faster than normal (palpitations).  Dizziness or fainting.  This may happen when you get physically tired or after you take certain heart medicines, such as nitroglycerin.  How is this diagnosed? This condition may be diagnosed with:  A physical exam.  Echocardiogram. This is a type of imaging test that uses sound waves (ultrasound) to make an image of your heart. There are two types that may be used: ? Transthoracic echocardiogram (TTE). This type of echocardiogram is noninvasive, and it is usually done first. ? Transesophageal echocardiogram (TEE). This type of echocardiogram is done by passing a  flexible tube down your esophagus. The heart and the esophagus are close to each other, so your health care provider can take very clear, detailed pictures of the heart using this type of test.  Cardiac catheterization. In this procedure, a thin, flexible tube (catheter) is passed through a large vein in your neck, groin, or arm. This procedure provides information about arteries, structures, blood pressure, and oxygen levels in your heart.  Electrocardiogram (ECG). This records the electrical impulses of your heart and assesses heart function.  Stress tests. These are tests that evaluate the blood supply to your heart and your heart's response to exercise.  Blood tests.  You may work with a health care provider who specializes in the heart (cardiologist). How is this treated? Treatment depends on how severe your condition is and what your symptoms are. You will need to have your heart checked regularly to make sure that your condition is not getting worse or causing serious problems. If your condition is mild, no treatment may be needed. Treatment may include:  Medicines that help keep your heart rate regular.  Medicines that thin your blood (anticoagulants) to prevent the formation of blood clots.  Antibiotic medicines to help prevent infection.  Surgery to replace your aortic valve. This is the most common treatment for aortic valve stenosis. Several types of surgeries are available. The surgery may be done through a large incision over your heart (open heart surgery), or it may be done using a minimally invasive technique (transcatheter aortic valve replacement, or TAVR).  Follow these instructions at home: Lifestyle   Limit alcohol intake to no more than 1 drink per day for nonpregnant women and 2 drinks per day for men. One drink equals 12 oz of beer, 5 oz of wine, or 1 oz of hard liquor.  Do not use any tobacco products, such as cigarettes, chewing tobacco, or e-cigarettes. If  you need help quitting, ask your health care provider.  Work with your health care provider to manage your blood pressure and cholesterol.  Maintain a healthy weight. Eating and drinking  Follow instructions from your health care provider about eating or drinking restrictions. ? Limit how much caffeine you drink. Caffeine can affect your heart's rate and rhythm.  Drink enough fluid to keep your urine clear or pale yellow.  Eat a heart-healthy diet. This should include plenty of fresh fruits and vegetables. If you eat meat, it should be lean cuts. Avoid foods that are: ? High in salt, saturated fat, or sugar. ? Canned or highly processed. ? Fried. Activity  Return to your normal activities as told by your health care provider. Ask your health care provider what activities are safe for you.  Exercise regularly, as told by your health care provider. Ask your health care provider what types of exercise are safe for you.  If your aortic valve stenosis is mild, you may need to avoid only very intense physical activity. The more severe your  aortic valve stenosis is, the more activities you may need to avoid. General instructions  Take over-the-counter and prescription medicines only as told by your health care provider.  If you are a woman and you plan to become pregnant, talk with your health care provider before you become pregnant.  Tell all health care providers who care for you that you have aortic valve stenosis.  Keep all follow-up visits as told by your health care provider. This is important. Contact a health care provider if:  You have a fever. Get help right away if:  You develop chest pain or tightness.  You develop shortness of breath or difficulty breathing.  You feel light-headed.  You feel like you might faint.  Your heartbeat is irregular or faster than normal. These symptoms may represent a serious problem that is an emergency. Do not wait to see if the  symptoms will go away. Get medical help right away. Call your local emergency services (911 in the [place] Do not drive yourself to the hospital. This information is not intended to replace advice given to you by your health care provider. Make sure you discuss any questions you have with your health care provider. Document Released: 10/23/2002 Document Revised: 07/02/2015 Document Reviewed: 12/28/2014 Elsevier Interactive Patient Education  2017 [employer]. special preparation required. You will return the kit via [facility] pre-paid shipping or pick-up, in the same box it arrived in. Then I will contact you to discuss your results after I receive them from the laboratory.   If you have any questions or concerns, [employer] Customer Support Specialist are available 24 hours a day, 7 days a week at [phone] or go to [email or web address].     VENOUS INSUFFICIENCY Our lower leg venous system is not the most reliable, the heart does NOT pump fluid up, there is a valve system.  The muscles of the leg squeeze and the blood moves up and a valve opens and close, then they squeeze, blood moves up and valves open and closes keeping the blood moving towards the heart.  Lots can go wrong with this valve system.  If someone is sitting or standing without movement, everyone will get swelling.  THINGS TO DO:  Do not stand or sit in one position for long periods of time. Do not sit with your legs crossed. Rest with your legs raised during the day.  Your legs have to be higher than your heart so that gravity will force the valves to open, so please really elevate your legs.   Wear elastic stockings or support hose. Do not wear other tight, encircling garments around the legs, pelvis, or waist.  [employer]  has a wide variety of well priced compression stockings. [place], [place] [address] [id number] [place] has a good cheap selection, I like the socks, they are not as hard to  get on  Walk as much as possible to increase blood flow.  Raise the foot of your bed at night with 2-inch blocks.  SEEK MEDICAL CARE IF:   The skin around your ankle starts to break down.  You have pain, redness, tenderness, or hard swelling developing in your leg over a vein.  You are uncomfortable due to leg pain.  If you ever have shortness of breath with exertion or chest pain go to the ER.  American cancer society  727-396-0408 for more information or for a free program for smoking cessation help.   You can call  QUIT SMART 1-800-QUIT-NOW for free nicotine patches or replacement therapy- if they are out- keep calling  [facility] Can call for smoking cessation classes, [phone]  If you have a smart phone, please look up Smoke Free app, this will help you stay on track and give you information about money you have saved, life that you have gained back and a ton of more information.   We are giving you chantix for smoking cessation. You can do it! And we are here to help! You may have heard some scary side effects about chantix, the three most common I hear about are nausea, crazy dreams and depression.  However, I like for my patients to try to stay on 1/2 a tablet twice a day rather than one tablet twice a day as normally prescribed. This helps decrease the chances of side effects and helps save money by making a one month prescription last two months  Please start the prescription this way:  Start 1/2 tablet by mouth once daily after food with a full glass of water for 3 days Then do 1/2 tablet by mouth twice daily for 4 days. During this first week you can smoke, but try to stop after this week.  At this point we have several options: 1) continue on 1/2 tablet twice a day- which I encourage you to do. You can stay on this dose the rest of the time on the medication or if you still feel the need to smoke you can do one of the two options below. 2) do one  tablet in the morning and 1/2 in the evening which helps decrease dreams. 3) do one tablet twice a day.   What if I miss a dose? If you miss a dose, take it as soon as you can. If it is almost time for your next dose, take only that dose. Do not take double or extra doses.  What should I watch for while using this medicine? Visit your doctor or health care professional for regular check ups. Ask for ongoing advice and encouragement from your doctor or healthcare professional, friends, and family to help you quit. If you smoke while on this medication, quit again  Your mouth may get dry. Chewing sugarless gum or hard candy, and drinking plenty of water may help. Contact your doctor if the problem does not go away or is severe.  You may get drowsy or dizzy. Do not drive, use machinery, or do anything that needs mental alertness until you know how this medicine affects you. Do not stand or sit up quickly, especially if you are an older patient.   The use of this medicine may increase the chance of suicidal thoughts or actions. Pay special attention to how you are responding while on this medicine. Any worsening of mood, or thoughts of suicide or dying should be reported to your health care professional right away.  ADVANTAGES OF QUITTING SMOKING  Within 20 minutes, blood pressure decreases. Your pulse is at normal level.  After 8 hours, carbon monoxide levels in the blood return to normal. Your oxygen level increases.  After 24 hours, the chance of having a heart attack starts to decrease. Your breath, hair, and body stop smelling like smoke.  After 48 hours, damaged nerve endings begin to recover. Your sense of taste and smell improve.  After 72 hours, the body is virtually free of nicotine. Your bronchial tubes relax and breathing becomes easier.  After 2 to 12 weeks,  lungs can hold more air. Exercise becomes easier and circulation improves.  After 1 year, the risk of coronary heart  disease is cut in half.  After 5 years, the risk of stroke falls to the same as a nonsmoker.  After 10 years, the risk of lung cancer is cut in half and the risk of other cancers decreases significantly.  After 15 years, the risk of coronary heart disease drops, usually to the level of a nonsmoker.  You will have extra money to spend on things other than cigarettes.

## 2017-06-05 LAB — CBC WITH DIFFERENTIAL/PLATELET
BASOS PCT: 0.8 %
Basophils Absolute: 61 cells/uL (ref 0–200)
EOS ABS: 182 {cells}/uL (ref 15–500)
Eosinophils Relative: 2.4 %
HEMATOCRIT: 48.1 % (ref 38.5–50.0)
Hemoglobin: 17.1 g/dL (ref 13.2–17.1)
LYMPHS ABS: 1968 {cells}/uL (ref 850–3900)
MCH: 31.1 pg (ref 27.0–33.0)
MCHC: 35.6 g/dL (ref 32.0–36.0)
MCV: 87.5 fL (ref 80.0–100.0)
MPV: 11.2 fL (ref 7.5–12.5)
Monocytes Relative: 9.5 %
NEUTROS PCT: 61.4 %
Neutro Abs: 4666 cells/uL (ref 1500–7800)
Platelets: 135 10*3/uL — ABNORMAL LOW (ref 140–400)
RBC: 5.5 10*6/uL (ref 4.20–5.80)
RDW: 12.3 % (ref 11.0–15.0)
TOTAL LYMPHOCYTE: 25.9 %
WBC: 7.6 10*3/uL (ref 3.8–10.8)
WBCMIX: 722 {cells}/uL (ref 200–950)

## 2017-06-05 LAB — COMPLETE METABOLIC PANEL WITH GFR
AG RATIO: 1.7 (calc) (ref 1.0–2.5)
ALT: 18 U/L (ref 9–46)
AST: 21 U/L (ref 10–35)
Albumin: 4.5 g/dL (ref 3.6–5.1)
Alkaline phosphatase (APISO): 49 U/L (ref 40–115)
BILIRUBIN TOTAL: 1 mg/dL (ref 0.2–1.2)
BUN: 20 mg/dL (ref 7–25)
CALCIUM: 9.6 mg/dL (ref 8.6–10.3)
CHLORIDE: 104 mmol/L (ref 98–110)
CO2: 28 mmol/L (ref 20–32)
Creat: 0.96 mg/dL (ref 0.70–1.33)
GFR, EST AFRICAN AMERICAN: 101 mL/min/{1.73_m2} (ref >=60)
GFR, EST NON AFRICAN AMERICAN: 87 mL/min/{1.73_m2} (ref >=60)
GLOBULIN: 2.6 g/dL (ref 1.9–3.7)
Glucose, Bld: 112 mg/dL — ABNORMAL HIGH (ref 65–99)
POTASSIUM: 4.5 mmol/L (ref 3.5–5.3)
SODIUM: 139 mmol/L (ref 135–146)
TOTAL PROTEIN: 7.1 g/dL (ref 6.1–8.1)

## 2017-06-05 LAB — LIPID PANEL
Cholesterol: 171 mg/dL (ref <200)
HDL: 45 mg/dL (ref >40)
LDL Cholesterol (Calc): 104 mg/dL (calc) — ABNORMAL HIGH
NON-HDL CHOLESTEROL (CALC): 126 mg/dL (ref <130)
Total CHOL/HDL Ratio: 3.8 (calc) (ref <5.0)
Triglycerides: 123 mg/dL (ref <150)

## 2017-11-30 ENCOUNTER — Encounter: Payer: Self-pay | Admitting: Physician Assistant

## 2017-11-30 ENCOUNTER — Ambulatory Visit (HOSPITAL_COMMUNITY)
Admission: RE | Admit: 2017-11-30 | Discharge: 2017-11-30 | Disposition: A | Discharge status: Home / Self Care | Payer: Managed Care, Other (non HMO) | Source: Ambulatory Visit | Attending: Physician Assistant | Admitting: Physician Assistant

## 2017-11-30 ENCOUNTER — Ambulatory Visit: Payer: Managed Care, Other (non HMO) | Admitting: Physician Assistant

## 2017-11-30 VITALS — BP 120/72 | HR 96 | Temp 98.1°F | Resp 20 | Ht 74.0 in | Wt 241.6 lb

## 2017-11-30 DIAGNOSIS — R0602 Shortness of breath: Secondary | ICD-10-CM

## 2017-11-30 MED ORDER — IPRATROPIUM-ALBUTEROL 0.5-2.5 (3) MG/3ML IN SOLN
3.0000 mL | Freq: Once | RESPIRATORY_TRACT | Status: AC
  Administered 2017-11-30: 3 mL via RESPIRATORY_TRACT

## 2017-11-30 MED ORDER — FLUTICASONE FUROATE-VILANTEROL 100-25 MCG/INH IN AEPB: 1.0000 | INHALATION_SPRAY | Freq: Every day | RESPIRATORY_TRACT | 0 refills | Status: DC

## 2017-11-30 MED ORDER — PREDNISONE 20 MG PO TABS: ORAL_TABLET | ORAL | 0 refills | Status: DC

## 2017-11-30 MED ORDER — AZITHROMYCIN 250 MG PO TABS: ORAL_TABLET | ORAL | 1 refills | Status: AC

## 2017-11-30 NOTE — Progress Notes (Signed)
Subjective:    Patient ID: Tyler Berger, male    DOB: [birth year], 57 y.o.   MRN: [id number]  HPI 57 y.o. smoking WM with history of bicuspid valve presents with SOB x last night. He states he has had 2 days of diarrhea, fever, chills, SOB, cough, thick clear mucus. No chest pain. No weight gain, swelling in his legs.    BMI is Body mass index is 31.02 kg/m., he is working on diet and exercise. Wt Readings from Last 3 Encounters:  11/30/17 241 lb 9.6 oz (109.6 kg)  06/01/17 237 lb 12.8 oz (107.9 kg)  06/01/16 229 lb (103.9 kg)   .   Blood pressure 120/72, pulse 96, temperature 98.1 F (36.7 C), resp. rate 20, height 6\' 2"  (1.88 m), weight 241 lb 9.6 oz (109.6 kg), SpO2 95 %.  Medications Current Outpatient Medications on File Prior to Visit  Medication Sig  . ALPRAZolam (XANAX) 0.5 MG tablet Take 1 tablet (0.5 mg total) by mouth at bedtime as needed for anxiety.  Marland Kitchen aspirin 81 MG chewable tablet Chew by mouth daily.  Marland Kitchen atenolol (TENORMIN) 100 MG tablet Take 1 tablet (100 mg total) by mouth daily.  . Cholecalciferol (VITAMIN D PO) Take 10,000 Units by mouth daily.  . furosemide (LASIX) 80 MG tablet TAKE ONE-HALF TO ONE TABLET BY MOUTH ONCE DAILY FOR BLOOD PRESSURE AND  FLUID  . lisinopril (PRINIVIL,ZESTRIL) 20 MG tablet Take 1 tablet (20 mg total) by mouth 2 (two) times daily.   No current facility-administered medications on file prior to visit.     Problem list He has Hyperlipidemia; Hypertension; Anxiety; Vitamin D deficiency; Medication management; Prediabetes; Varicose veins of both lower extremities; Bicuspid aortic valve; and LBBB (left bundle branch block) on their problem list.   Review of Systems  Constitutional: Positive for chills and fever. Negative for diaphoresis.  HENT: Positive for sinus pressure and sore throat. Negative for congestion, ear pain, postnasal drip, rhinorrhea, sneezing, trouble swallowing and voice change.   Eyes: Negative.   Respiratory: Positive  for cough, shortness of breath and wheezing. Negative for chest tightness.   Cardiovascular: Negative.   Gastrointestinal: Negative.   Genitourinary: Negative.   Musculoskeletal: Negative.  Negative for neck pain.  Neurological: Negative.  Negative for headaches.       Objective:   Physical Exam  Constitutional: He is oriented to person, place, and time. He appears well-developed and well-nourished.  HENT:  Head: Normocephalic and atraumatic.  Right Ear: External ear normal.  Left Ear: External ear normal.  Nose: Right sinus exhibits maxillary sinus tenderness. Left sinus exhibits maxillary sinus tenderness.  Mouth/Throat: Oropharynx is clear and moist.  Eyes: Pupils are equal, round, and reactive to light. Conjunctivae and EOM are normal.  Neck: Normal range of motion. Neck supple.  Cardiovascular: Normal rate and regular rhythm.  Murmur heard.  Systolic murmur is present with a grade of 4/6. Pulmonary/Chest: Effort normal. He has decreased breath sounds in the right lower field and the left lower field.  Abdominal: Soft. Bowel sounds are normal.  Musculoskeletal: Normal range of motion.  Neurological: He is alert and oriented to person, place, and time. No cranial nerve deficit.  Skin: Skin is warm and dry.  Psychiatric: He has a normal mood and affect. His behavior is normal.        Assessment & Plan:    Shortness of breath with bicuspid valve Get CXR rule out effusion versus pneumonia versus COPD exacerbation -  PR ALBUTEROL IPRATROP NON-COMP -     DG Chest 2 View; Future -     ipratropium-albuterol (DUONEB) 0.5-2.5 (3) MG/3ML nebulizer solution 3 mL -     predniSONE (DELTASONE) 20 MG tablet; 2 tablets daily for 3 days, 1 tablet daily for 4 days. -     azithromycin (ZITHROMAX) 250 MG tablet; Take 2 tablets (500 mg) on  Day 1,  followed by 1 tablet (250 mg) once daily on Days 2 through 5. -     fluticasone furoate-vilanterol (BREO ELLIPTA) 100-25 MCG/INH AEPB; Inhale  1 puff into the lungs daily. Rinse mouth with water after each use   The patient was advised to call immediately if he has any concerning symptoms in the interval. The patient voices understanding of current treatment options and is in agreement with the current care plan.The patient knows to call the clinic with any problems, questions or concerns or go to the ER if any further progression of symptoms.

## 2017-11-30 NOTE — Patient Instructions (Signed)
INFORMATION ABOUT YOUR STEROID INHALER  Can do steroid inhaler, NEED TO DO DAILY, this is NOT a rescue inhaler so if you are acutely short of breath please use your albuterol or call 911.  Do 1 puff ONCE a day.  Do before you brush your teeth OR wash your mouth afterwards.  IF YOU DO NOT [place] YOUR MOUTH OUT IT CAN CAUSE YEAST Can do 2 tsp vinegar with water and switch to help prevent yeast or help yeast in your mouth.   Go to the ER if any chest pain, shortness of breath, nausea, dizziness, severe HA, changes vision/speech   Go to the ER if any chest pain, shortness of breath, nausea, dizziness, severe HA, changes vision/speech   Shortness of Breath, Adult Shortness of breath is when a person has trouble breathing enough air, or when a person feels like she or he is having trouble breathing in enough air. Shortness of breath could be a sign of medical problem. Follow these instructions at home: Pay attention to any changes in your symptoms. Take these actions to help with your condition:  Do not smoke. Smoking is a common cause of shortness of breath. If you smoke and you need help quitting, ask your health care provider.  Avoid things that can irritate your airways, such as: ? Mold. ? Dust. ? Air pollution. ? Chemical fumes. ? Things that can cause allergy symptoms (allergens), if you have allergies.  Keep your living space clean and free of mold and dust.  Rest as needed. Slowly return to your usual activities.  Take over-the-counter and prescription medicines, including oxygen and inhaled medicines, only as told by your health care provider.  Keep all follow-up visits as told by your health care provider. This is important.  Contact a health care provider if:  Your condition does not improve as soon as expected.  You have a hard time doing your normal activities, even after you rest.  You have new symptoms. Get help right away if:  Your shortness of breath gets  worse.  You have shortness of breath when you are resting.  You feel light-headed or you faint.  You have a cough that is not controlled with medicines.  You cough up blood.  You have pain with breathing.  You have pain in your chest, arms, shoulders, or abdomen.  You have a fever.  You cannot walk up stairs or exercise the way that you normally do. This information is not intended to replace advice given to you by your health care provider. Make sure you discuss any questions you have with your health care provider. Document Released: 10/19/2000 Document Revised: 08/15/2015 Document Reviewed: 07/02/2015 Elsevier Interactive Patient Education  [employer].

## 2017-12-11 ENCOUNTER — Encounter: Payer: Self-pay | Admitting: Physician Assistant

## 2017-12-11 ENCOUNTER — Telehealth: Payer: Self-pay

## 2017-12-11 ENCOUNTER — Ambulatory Visit (INDEPENDENT_AMBULATORY_CARE_PROVIDER_SITE_OTHER): Payer: Managed Care, Other (non HMO) | Admitting: Physician Assistant

## 2017-12-11 VITALS — BP 126/80 | HR 65 | Temp 98.5°F | Resp 16 | Wt 248.0 lb

## 2017-12-11 DIAGNOSIS — R1011 Right upper quadrant pain: Secondary | ICD-10-CM

## 2017-12-11 DIAGNOSIS — S301XXA Contusion of abdominal wall, initial encounter: Secondary | ICD-10-CM

## 2017-12-11 DIAGNOSIS — R05 Cough: Secondary | ICD-10-CM

## 2017-12-11 DIAGNOSIS — R059 Cough, unspecified: Secondary | ICD-10-CM

## 2017-12-11 LAB — CBC WITH DIFFERENTIAL/PLATELET
BASOS ABS: 58 {cells}/uL (ref 0–200)
BASOS PCT: 0.5 %
EOS PCT: 2.5 %
Eosinophils Absolute: 290 cells/uL (ref 15–500)
HCT: 45.5 % (ref 38.5–50.0)
HEMOGLOBIN: 16.1 g/dL (ref 13.2–17.1)
Lymphs Abs: 1960 cells/uL (ref 850–3900)
MCH: 30.9 pg (ref 27.0–33.0)
MCHC: 35.4 g/dL (ref 32.0–36.0)
MCV: 87.3 fL (ref 80.0–100.0)
MONOS PCT: 8.7 %
MPV: 10.9 fL (ref 7.5–12.5)
Neutro Abs: 8282 cells/uL — ABNORMAL HIGH (ref 1500–7800)
Neutrophils Relative %: 71.4 %
PLATELETS: 144 10*3/uL (ref 140–400)
RBC: 5.21 10*6/uL (ref 4.20–5.80)
RDW: 12.5 % (ref 11.0–15.0)
Total Lymphocyte: 16.9 %
WBC mixed population: 1009 cells/uL — ABNORMAL HIGH (ref 200–950)
WBC: 11.6 10*3/uL — ABNORMAL HIGH (ref 3.8–10.8)

## 2017-12-11 LAB — COMPLETE METABOLIC PANEL WITH GFR
AG Ratio: 1.7 (calc) (ref 1.0–2.5)
ALKALINE PHOSPHATASE (APISO): 55 U/L (ref 40–115)
ALT: 20 U/L (ref 9–46)
AST: 18 U/L (ref 10–35)
Albumin: 4.3 g/dL (ref 3.6–5.1)
BUN: 18 mg/dL (ref 7–25)
CO2: 29 mmol/L (ref 20–32)
CREATININE: 0.93 mg/dL (ref 0.70–1.33)
Calcium: 9.6 mg/dL (ref 8.6–10.3)
Chloride: 102 mmol/L (ref 98–110)
GFR, Est African American: 105 mL/min/{1.73_m2} (ref >=60)
GFR, Est Non African American: 90 mL/min/{1.73_m2} (ref >=60)
GLUCOSE: 99 mg/dL (ref 65–99)
Globulin: 2.6 g/dL (calc) (ref 1.9–3.7)
Potassium: 4.1 mmol/L (ref 3.5–5.3)
Sodium: 139 mmol/L (ref 135–146)
Total Bilirubin: 1 mg/dL (ref 0.2–1.2)
Total Protein: 6.9 g/dL (ref 6.1–8.1)

## 2017-12-11 LAB — URINALYSIS, ROUTINE W REFLEX MICROSCOPIC
Bilirubin Urine: NEGATIVE
Glucose, UA: NEGATIVE
Hgb urine dipstick: NEGATIVE
Ketones, ur: NEGATIVE
LEUKOCYTES UA: NEGATIVE
NITRITE: NEGATIVE
PROTEIN: NEGATIVE
Specific Gravity, Urine: 1.009 (ref 1.001–1.03)
pH: 5.5 (ref 5.0–8.0)

## 2017-12-11 LAB — APTT: APTT: 29 s (ref 22–34)

## 2017-12-11 LAB — PROTIME-INR
INR: 1
PROTHROMBIN TIME: 10.3 s (ref 9.0–11.5)

## 2017-12-11 NOTE — Progress Notes (Signed)
Subjective:    Patient ID: Tyler Berger, male    DOB: [birth year], 57 y.o.   MRN: [id number]  HPI 56 y.o. smoking WM with history of bicuspid valve, thrombcytopenia presents with ecchymosis on right side. He was seen Thursday 11/30/2017 for cough/SOB/chills and treated with zpak and prednisone. Had normal CXR other than hyperinflation and tortuosity of the thoracic aorta. He continues to cough with some mild shortness of breath.    He woke up with ecchymosis 1 week later on 10/31 he was getting out of the shower and noticed right flank hematoma, it has spread anterior to his AB. He denies pain at the side but states he does have upper right back/flank pain when he coughs still. He is on bASA.   No fever, chills, no abnormal urine color, no other rashes, no other signs of bleeding.    Lab Results  Component Value Date   WBC 7.6 06/01/2017   HGB 17.1 06/01/2017   HCT 48.1 06/01/2017   MCV 87.5 06/01/2017   PLT 135 (L) 06/01/2017   Medications Current Outpatient Medications on File Prior to Visit  Medication Sig  . ALPRAZolam (XANAX) 0.5 MG tablet Take 1 tablet (0.5 mg total) by mouth at bedtime as needed for anxiety.  Marland Kitchen aspirin 81 MG chewable tablet Chew by mouth daily.  Marland Kitchen atenolol (TENORMIN) 100 MG tablet Take 1 tablet (100 mg total) by mouth daily.  . Cholecalciferol (VITAMIN D PO) Take 10,000 Units by mouth daily.  . fluticasone furoate-vilanterol (BREO ELLIPTA) 100-25 MCG/INH AEPB Inhale 1 puff into the lungs daily. Rinse mouth with water after each use  . furosemide (LASIX) 80 MG tablet TAKE ONE-HALF TO ONE TABLET BY MOUTH ONCE DAILY FOR BLOOD PRESSURE AND  FLUID  . lisinopril (PRINIVIL,ZESTRIL) 20 MG tablet Take 1 tablet (20 mg total) by mouth 2 (two) times daily.   No current facility-administered medications on file prior to visit.     Problem list He has Hyperlipidemia; Hypertension; Anxiety; Vitamin D deficiency; Medication management; Prediabetes; Varicose veins of both  lower extremities; Bicuspid aortic valve; and LBBB (left bundle branch block) on their problem list.   Review of Systems  Constitutional: Positive for appetite change. Negative for activity change, chills, diaphoresis, fatigue, fever and unexpected weight change.  HENT: Negative.   Respiratory: Positive for cough and shortness of breath. Negative for apnea, choking, chest tightness, wheezing and stridor.   Cardiovascular: Negative.  Negative for chest pain and palpitations.  Gastrointestinal: Positive for abdominal pain. Negative for abdominal distention, anal bleeding, blood in stool, constipation, diarrhea, nausea, rectal pain and vomiting.  Musculoskeletal: Negative.   Skin: Positive for rash (right flank). Negative for color change, pallor and wound.       Objective:   Physical Exam  Constitutional: He is oriented to person, place, and time. He appears well-developed and well-nourished.  HENT:  Head: Normocephalic and atraumatic.  Right Ear: External ear normal.  Left Ear: External ear normal.  Mouth/Throat: Oropharynx is clear and moist.  Eyes: Pupils are equal, round, and reactive to light. Conjunctivae and EOM are normal.  Neck: Normal range of motion. Neck supple.  Cardiovascular: Normal rate and regular rhythm.  Murmur heard.  Systolic murmur is present with a grade of 4/6. Pulmonary/Chest: Effort normal. He has decreased breath sounds in the right lower field.  Abdominal: Soft. Bowel sounds are normal. There is tenderness (right flank at 4x3 inch hematoma with surrounding ecchymosis. ). There is no rebound, no guarding, no tenderness at  McBurney's point and negative Murphy's sign.  Musculoskeletal: Normal range of motion.  Neurological: He is alert and oriented to person, place, and time. No cranial nerve deficit.  Skin: Skin is warm and dry. Ecchymosis (see pictures, right flank with large ecchymosis wrapping around anterior and palpable 3x4 inch hematoma) noted. No  petechiae noted. Bruising: no other bruising noted anywhere else.  Psychiatric: He has a normal mood and affect. His behavior is normal.              Assessment & Plan:    Hematoma of right flank, initial encounter with right flank pain Check labs especially with history of thrombocytopenia Stop ASA Get ct rule out retroperitoneal bleeding.  -     CBC with Differential/Platelet -     COMPLETE METABOLIC PANEL WITH GFR -     Urinalysis, Routine w reflex microscopic -     Protime-INR -     APTT - -     CT Abdomen Pelvis Wo Contrast; Future  Cough -     CT Chest Wo Contrast; Future - cough with SOB still, smoker, get CT chest rule out mass

## 2017-12-11 NOTE — Telephone Encounter (Signed)
Pt called for STAT lab results which were given to him after Dr. Crissie Sickles reviewed them & relayed what to tell the patient.Pt voiced understanding & agreed to wait for advise.           Pt would like to know what is next in his plan of care. Please advise.

## 2017-12-11 NOTE — Patient Instructions (Signed)
Will go to the ER if worsening headache, changes vision/speech, imbalance, weakness.  Any severe AB pain, blood in urine, dizziness, go to ER    Hematoma A hematoma is a collection of blood under the skin, in an organ, in a body space, in a joint space, or in other tissue. The blood can thicken (clot) to form a lump that you can see and feel. The lump is often firm and may become sore and tender. Most hematomas get better in a few days to weeks. However, some hematomas may be serious and require medical care. Hematomas can range from very small to very large. What are the causes? This condition is caused by:  A blunt or penetrating injury.  A leakage from a blood vessel under the skin. This leak happens on its own (is spontaneous) and is more likely to occur in older people, especially those who take blood thinners.  Some medical procedures including surgeries, such as oral surgery, face lifts, and surgeries that involve the joints.  Some medical conditions that cause bleeding or bruising problems. There may be multiple hematomas that appear in different areas of the body.  What are the signs or symptoms? Symptoms of this condition can depend on where the hematoma is located. Common symptoms of a hematoma under the skin include:  A firm lump on the body.  Pain and tenderness in the area.  Bruising. Blue, dark blue, purple-red, or yellowish skin (discoloration) may appear at the site of the hematoma if the hematoma is close to the surface of the skin.  For hematomas in deeper tissues or body spaces, symptoms may be less obvious. A collection of blood in the stomach (intra-abdominal hematoma) may cause pain in the abdomen, weakness, fainting, and shortness of breath. A collection of blood in the head (intracranial hematoma) may cause a headache or symptoms such as weakness, trouble speaking or understanding, or a change in consciousness. How is this diagnosed? This condition is diagnosed  based on:  Your medical history.  A physical exam.  Imaging tests, such as an ultrasonogram or CT scan. These may be needed if your health care provider suspects a hematoma in deeper tissues or body spaces.  Blood tests. These may be needed if your health care provider believes that the hematoma is caused by a medical condition.  How is this treated? This condition usually does not need treatment because many hematomas go away on their own over time. However, large hematomas, or those that may affect vital organs, may need surgical drainage or monitoring. If the hematoma is caused by a medical condition, medicines may be prescribed. Follow these instructions at home: Managing pain, stiffness, and swelling  If directed, apply ice to the affected area: ? Put ice in a plastic bag. ? Place a towel between your skin and the bag. ? Leave the ice on for 20 minutes, 2-3 times a day for the first couple of days.  After applying ice for a couple of days, your health care provider may recommend that you apply warm compresses to the affected area instead. Do this as told by your health care provider. Remove the heat if your skin turns bright red. This is especially important if you are unable to feel pain, heat, or cold. You may have a greater risk of getting burned  Raise (elevate) the affected area above the level of your heart while you are sitting or lying down.  Wrap the affected area with an elastic bandage, if told  by your health care provider. The bandage applies pressure (compression) to the area, which may help to reduce swelling and help the hematoma heal. Make sure the bandage is not wrapped too tight.  If your hematoma is on a leg or foot (lower extremity) and is painful, your health care provider may recommend crutches. Use them as told by your health care provider. General instructions  Take over-the-counter and prescription medicines only as told by your health care provider.  Keep  all follow-up visits as told by your health care provider. This is important. Contact a health care provider if:  You have a fever.  The swelling or discoloration gets worse.  You develop more hematomas. Get help right away if:  Your pain is worse or your pain is not controlled with medicine.  Your skin over the hematoma breaks or starts bleeding.  Your hematoma is in your chest or abdomen and you have weakness, shortness of breath, or a change in consciousness.  You have a hematoma on your scalp caused by a fall or injury and you have a headache that gets worse, trouble speaking or understanding, weakness, or a change in alertness or consciousness. Summary  A hematoma is a collection of blood under the skin, in an organ, in a body space, in a joint space, or in other tissue.  This condition usually does not need treatment because many hematomas go away on their own over time.  Large hematomas, or those that may affect vital organs, may need surgical drainage or monitoring. If the hematoma is caused by a medical condition, medicines may be prescribed. This information is not intended to replace advice given to you by your health care provider. Make sure you discuss any questions you have with your health care provider. Document Released: 09/08/2003 Document Revised: 02/27/2016 Document Reviewed: 02/27/2016 Elsevier Interactive Patient Education  2018 [employer].  Retroperitoneal Bleeding Retroperitoneal bleeding happens when the blood vessels behind your abdomen bleed into the space between your abdomen and your back (retroperitoneal space). This space contains:  Your kidneys.  The glands that are on top of your kidneys (adrenal glands).  The tubes that drain urine from your kidneys (ureters).  The large blood vessel that carries blood to your lower body (aorta).  Some parts of your digestive tract.  Bleeding may come from any organ in the retroperitoneal space. This is a  rare but life-threatening condition. What are the causes? This condition may be caused by:  Surgery in the retroperitoneal space.  Injury (trauma) to your pelvic area, abdomen, or back.  When retroperitoneal bleeding is not caused by trauma, it is called spontaneous retroperitoneal bleeding. Spontaneous retroperitoneal bleeding may be caused by:  Tumors of the kidneys or adrenal glands.  Use of medicines that thin your blood (anticoagulants).  Diseases that cause your body to be unable to form blood clots (clotting disorders).  Swelling in a blood vessel from a weakened artery wall (aneurysm) that can rupture and cause bleeding.  Sometimes, the cause is not known (idiopathic). What increases the risk? This condition is more likely to develop in people:  Who are on dialysis.  Who take anticoagulant medicine.  Who have high blood pressure.  Who have hardening of the arteries (atherosclerosis).  What are the signs or symptoms? Signs and symptoms may appear suddenly if the bleeding starts suddenly (acute), or they may appear gradually if the bleeding occurs slowly over time (chronic). Symptoms of this condition include:  Pain in the abdomen or  side (flank).  Pain when pressing on the abdomen or flank.  Dizziness or light-headedness from low blood pressure.  Rapid heartbeat (tachycardia).  Blood in the urine.  Very low blood pressure, which can cause shock. Symptoms of shock include: ? Fainting. ? Trouble with breathing. ? Cold, clammy skin.  How is this diagnosed? This condition may be diagnosed based on your symptoms and your medical history. It is important to find the cause of the bleeding. Your health care provider will do a physical exam. You may also have tests, including:  Abdominal X-rays to check for signs of bleeding or aneurysm.  Blood tests to check for low blood counts (anemia) or blood loss.  Imaging studies, such as: ? CT scan. You may have dye  injected into a blood vessel to help locate the source of the bleeding (CT angiography). ? Ultrasound. ? MRI.  How is this treated? The goal of treatment is to remove the blood and stop the bleeding. Treatment may include:  Giving you fluids through an IV tube to get your blood pressure back into a normal range.  Giving you blood from a donor (transfusion).  Placing a long tube (catheter) through your blood vessel to the site of bleeding and blocking the bleeding with a small plug (embolization).  Exploratory surgery to find the bleeding and stop it. This may be done using an operating scope (laparoscopy) or as an open surgery (laparotomy).  This information is not intended to replace advice given to you by your health care provider. Make sure you discuss any questions you have with your health care provider. Document Released: 04/15/2004 Document Revised: 07/02/2015 Document Reviewed: 10/30/2013 Elsevier Interactive Patient Education  [employer].

## 2017-12-11 NOTE — Telephone Encounter (Signed)
We will set up a CT scan of his AB

## 2017-12-11 NOTE — Addendum Note (Signed)
Addended by: Vicie Mutters R on: 12/11/2017 07:38 PM   Modules accepted: Orders

## 2017-12-12 NOTE — Telephone Encounter (Signed)
LVM to inform pt of referral for CT scan

## 2017-12-14 ENCOUNTER — Ambulatory Visit
Admission: RE | Admit: 2017-12-14 | Discharge: 2017-12-14 | Disposition: A | Discharge status: Home / Self Care | Payer: Managed Care, Other (non HMO) | Source: Ambulatory Visit | Attending: Physician Assistant | Admitting: Physician Assistant

## 2017-12-14 ENCOUNTER — Other Ambulatory Visit: Payer: Self-pay | Admitting: Physician Assistant

## 2017-12-14 DIAGNOSIS — R1011 Right upper quadrant pain: Secondary | ICD-10-CM

## 2017-12-14 MED ORDER — IOPAMIDOL (ISOVUE-300) INJECTION 61%
100.0000 mL | Freq: Once | INTRAVENOUS | Status: AC | PRN
  Administered 2017-12-14: 100 mL via INTRAVENOUS

## 2018-02-08 ENCOUNTER — Encounter (HOSPITAL_COMMUNITY): Payer: Self-pay | Admitting: Emergency Medicine

## 2018-02-08 ENCOUNTER — Emergency Department (HOSPITAL_COMMUNITY): Payer: Managed Care, Other (non HMO)

## 2018-02-08 ENCOUNTER — Emergency Department (HOSPITAL_COMMUNITY)
Admission: EM | Admit: 2018-02-08 | Discharge: 2018-02-08 | Disposition: A | Discharge status: Home / Self Care | Payer: Managed Care, Other (non HMO) | Attending: Emergency Medicine | Admitting: Emergency Medicine

## 2018-02-08 DIAGNOSIS — I10 Essential (primary) hypertension: Secondary | ICD-10-CM | POA: Diagnosis not present

## 2018-02-08 DIAGNOSIS — Z7982 Long term (current) use of aspirin: Secondary | ICD-10-CM | POA: Insufficient documentation

## 2018-02-08 DIAGNOSIS — Z79899 Other long term (current) drug therapy: Secondary | ICD-10-CM | POA: Insufficient documentation

## 2018-02-08 DIAGNOSIS — I471 Supraventricular tachycardia: Secondary | ICD-10-CM | POA: Diagnosis not present

## 2018-02-08 DIAGNOSIS — F17210 Nicotine dependence, cigarettes, uncomplicated: Secondary | ICD-10-CM | POA: Diagnosis not present

## 2018-02-08 DIAGNOSIS — R000 Tachycardia, unspecified: Secondary | ICD-10-CM | POA: Diagnosis present

## 2018-02-08 LAB — CBC WITH DIFFERENTIAL/PLATELET
Abs Immature Granulocytes: 0.05 10*3/uL (ref 0.00–0.07)
Basophils Absolute: 0.1 10*3/uL (ref 0.0–0.1)
Basophils Relative: 1 %
Eosinophils Absolute: 0.2 10*3/uL (ref 0.0–0.5)
Eosinophils Relative: 2 %
HCT: 52.7 % — ABNORMAL HIGH (ref 39.0–52.0)
Hemoglobin: 17.7 g/dL — ABNORMAL HIGH (ref 13.0–17.0)
Immature Granulocytes: 1 %
LYMPHS ABS: 1.8 10*3/uL (ref 0.7–4.0)
Lymphocytes Relative: 18 %
MCH: 30.7 pg (ref 26.0–34.0)
MCHC: 33.6 g/dL (ref 30.0–36.0)
MCV: 91.3 fL (ref 80.0–100.0)
Monocytes Absolute: 1 10*3/uL (ref 0.1–1.0)
Monocytes Relative: 10 %
Neutro Abs: 7 10*3/uL (ref 1.7–7.7)
Neutrophils Relative %: 68 %
Platelets: 123 10*3/uL — ABNORMAL LOW (ref 150–400)
RBC: 5.77 MIL/uL (ref 4.22–5.81)
RDW: 13.2 % (ref 11.5–15.5)
WBC: 10 10*3/uL (ref 4.0–10.5)
nRBC: 0 % (ref 0.0–0.2)

## 2018-02-08 LAB — TROPONIN I
Troponin I: 0.03 ng/mL (ref <0.03)
Troponin I: 0.05 ng/mL (ref <0.03)

## 2018-02-08 LAB — BASIC METABOLIC PANEL
Anion gap: 11 (ref 5–15)
BUN: 23 mg/dL — ABNORMAL HIGH (ref 6–20)
CO2: 22 mmol/L (ref 22–32)
Calcium: 9.6 mg/dL (ref 8.9–10.3)
Chloride: 105 mmol/L (ref 98–111)
Creatinine, Ser: 1.06 mg/dL (ref 0.61–1.24)
GFR calc Af Amer: >60 mL/min (ref >60)
GFR calc non Af Amer: >60 mL/min (ref >60)
Glucose, Bld: 167 mg/dL — ABNORMAL HIGH (ref 70–99)
Potassium: 3.9 mmol/L (ref 3.5–5.1)
Sodium: 138 mmol/L (ref 135–145)

## 2018-02-08 LAB — MAGNESIUM: Magnesium: 2.2 mg/dL (ref 1.7–2.4)

## 2018-02-08 LAB — TSH: TSH: 1.081 u[IU]/mL (ref 0.350–4.500)

## 2018-02-08 MED ORDER — ATENOLOL 100 MG PO TABS
100.0000 mg | ORAL_TABLET | Freq: Once | ORAL | Status: AC
  Administered 2018-02-08: 100 mg via ORAL
  Filled 2018-02-08: qty 1

## 2018-02-08 MED ORDER — METOPROLOL TARTRATE 5 MG/5ML IV SOLN
5.0000 mg | Freq: Once | INTRAVENOUS | Status: AC
  Administered 2018-02-08: 5 mg via INTRAVENOUS
  Filled 2018-02-08: qty 5

## 2018-02-08 NOTE — ED Triage Notes (Signed)
Pt reports at work and checked his heart rate which was high. Pt reports been feeling bad since yesterday. Pt very anxious.

## 2018-02-08 NOTE — ED Provider Notes (Signed)
[facility] DEPT Provider Note   CSN: [id number] Arrival date & time: 02/08/18  1037     History   Chief Complaint Chief Complaint  Patient presents with  . Tachycardia    HPI Tyler Berger is a 58 y.o. male.  HPI Patient with previous history of myocarditis.  States that was years ago.  States starting last night he began to feel his heart rate go fast.  Heart rate was high today at work.  Feels fatigued.  No weight loss.  Is on atenolol but states he has not taken it either yesterday or today.  States he has a refill but has not gone yet.  Has seen Dr. Percival Spanish in the past.  Upon arrival to ER found to have a heart rate of around 200. Past Medical History:  Diagnosis Date  . Anxiety   . Hyperlipidemia   . Hypertension   . Viral myocarditis 2004  . Vitamin D deficiency     Patient Active Problem List   Diagnosis Date Noted  . Bicuspid aortic valve 06/01/2017  . LBBB (left bundle branch block) 06/01/2017  . Varicose veins of both lower extremities 06/01/2016  . Medication management 08/25/2013  . Prediabetes 08/25/2013  . Hyperlipidemia   . Hypertension   . Anxiety   . Vitamin D deficiency     Past Surgical History:  Procedure Laterality Date  . HERNIA REPAIR Left 2010  . ORCHIECTOMY Left 1982  . VARICOSE VEIN SURGERY Left 05/1998        Home Medications    Prior to Admission medications   Medication Sig Start Date End Date Taking? Authorizing Provider  acetaminophen (TYLENOL) 500 MG tablet Take 1,000 mg by mouth every 6 (six) hours as needed for mild pain or headache.   Yes [provider]  ALPRAZolam Duanne Moron) 0.5 MG tablet Take 1 tablet (0.5 mg total) by mouth at bedtime as needed for anxiety. Patient taking differently: Take 0.5 mg by mouth at bedtime as needed for anxiety.  06/01/17  Yes Vicie Mutters, PA-C  aspirin 81 MG chewable tablet Chew by mouth daily.   Yes [provider]  atenolol (TENORMIN) 100 MG  tablet Take 1 tablet (100 mg total) by mouth daily. 06/01/17  Yes Vicie Mutters, PA-C  Cholecalciferol (VITAMIN D PO) Take 10,000 Units by mouth daily.   Yes [provider]  fluticasone furoate-vilanterol (BREO ELLIPTA) 100-25 MCG/INH AEPB Inhale 1 puff into the lungs daily. Rinse mouth with water after each use Patient taking differently: Inhale 1 puff into the lungs daily as needed (shortness of breath). Rinse mouth with water after each use 11/30/17  Yes Vicie Mutters, PA-C  furosemide (LASIX) 80 MG tablet TAKE ONE-HALF TO ONE TABLET BY MOUTH ONCE DAILY FOR BLOOD PRESSURE AND  FLUID Patient taking differently: Take 80 mg by mouth daily.  06/01/17  Yes Vicie Mutters, PA-C  lisinopril (PRINIVIL,ZESTRIL) 20 MG tablet Take 1 tablet (20 mg total) by mouth 2 (two) times daily. Patient taking differently: Take 20 mg by mouth daily.  06/01/17  Yes Vicie Mutters, PA-C    Family History Family History  Problem Relation Age of Onset  . Diabetes Mother   . Hypertension Mother   . Cancer Mother        breast  . Alzheimer's disease Mother   . Hypertension Father   . Cancer Father        head and neck  . Hypertension Sister     Social History Social  History   Tobacco Use  . Smoking status: Current Every Day Smoker    Packs/day: 0.50    Years: 30.00    Pack years: 15.00    Types: Cigarettes  . Smokeless tobacco: Never Used  Substance Use Topics  . Alcohol use: Not on file  . Drug use: Not on file     Allergies   Patient has no known allergies.   Review of Systems Review of Systems  Constitutional: Negative for appetite change.  HENT: Negative for congestion.   Respiratory: Positive for shortness of breath.   Cardiovascular: Positive for palpitations. Negative for leg swelling.  Gastrointestinal: Negative for abdominal pain.  Genitourinary: Negative for flank pain.  Musculoskeletal: Negative for back pain.  Skin: Negative for rash.  Neurological: Negative for  weakness and numbness.  Psychiatric/Behavioral: Negative for confusion.     Physical Exam Updated Vital Signs BP 113/64   Pulse 69   Temp 98 F (36.7 C) (Oral)   Resp 20   Ht 6\' 3"  (1.905 m)   Wt 107.5 kg   SpO2 96%   BMI 29.62 kg/m   Physical Exam HENT:     Head: Normocephalic.     Mouth/Throat:     Mouth: Mucous membranes are moist.  Eyes:     Pupils: Pupils are equal, round, and reactive to light.  Cardiovascular:     Comments: Tachycardia Pulmonary:     Effort: Pulmonary effort is normal.  Abdominal:     Tenderness: There is no abdominal tenderness.  Musculoskeletal:        General: No deformity.     Right lower leg: No edema.     Left lower leg: No edema.  Skin:    General: Skin is warm.  Neurological:     General: No focal deficit present.     Mental Status: He is alert.      ED Treatments / Results  Labs (all labs ordered are listed, but only abnormal results are displayed) Labs Reviewed  CBC WITH DIFFERENTIAL/PLATELET - Abnormal; Notable for the following components:      Result Value   Hemoglobin 17.7 (*)    HCT 52.7 (*)    Platelets 123 (*)    All other components within normal limits  BASIC METABOLIC PANEL - Abnormal; Notable for the following components:   Glucose, Bld 167 (*)    BUN 23 (*)    All other components within normal limits  TROPONIN I - Abnormal; Notable for the following components:   Troponin I 0.03 (*)    All other components within normal limits  TROPONIN I - Abnormal; Notable for the following components:   Troponin I 0.05 (*)    All other components within normal limits  MAGNESIUM  TSH    EKG EKG Interpretation  Date/Time:  Thursday February 08 2018 10:45:46 EST Ventricular Rate:  212 PR Interval:    QRS Duration: 154 QT Interval:    QTC Calculation:   R Axis:   -82 Text Interpretation:  Extreme tachycardia with wide complex, no further rhythm analysis attempted SVT? Baseline wander in lead(s) I aVL V1 V2 V5  Confirmed by Davonna Belling [id number]) on 02/08/2018 3:16:14 PM   Radiology Dg Chest 2 View  Result Date: 02/08/2018 CLINICAL DATA:  Shortness of breath. EXAM: CHEST - 2 VIEW COMPARISON:  Radiographs of November 30, 2017. FINDINGS: Stable cardiomediastinal silhouette. No pneumothorax or pleural effusion is noted. Both lungs are clear. The visualized skeletal structures are unremarkable. IMPRESSION: No  active cardiopulmonary disease. Electronically Signed   By: Marijo Conception, M.D.   On: 02/08/2018 11:51    Procedures Procedures (including critical care time)  Medications Ordered in ED Medications  metoprolol tartrate (LOPRESSOR) injection 5 mg (5 mg Intravenous Given 02/08/18 1119)  atenolol (TENORMIN) tablet 100 mg (100 mg Oral Given 02/08/18 1302)     Initial Impression / Assessment and Plan / ED Course  I have reviewed the triage vital signs and the nursing notes.  Pertinent labs & imaging results that were available during my care of the patient were reviewed by me and considered in my medical decision making (see chart for details).    Patient with tachycardia.  Began last night.  Appears to be in SVT.  Morphology appears similar to prior EKGs but is slightly wider.  Patient initially converted on his own but then reoccurred and 5 mg metoprolol kept him back in a sinus rhythm.  Had not taken his atenolol for 2 days.  Given oral dose here.  Continues to be in a sinus rhythm.  Troponin initially 0.03 and on repeat 0.05.  I think likely rate related and not ischemic.  Will have follow-up with Dr. Percival Spanish, the patient is seen previously.  Final Clinical Impressions(s) / ED Diagnoses   Final diagnoses:  SVT (supraventricular tachycardia) [facility])    ED Discharge Orders    None       Davonna Belling, MD 02/08/18 1554

## 2018-02-08 NOTE — ED Notes (Signed)
Date and time results received: 02/08/18 1155 (use smartphrase ".now" to insert current time)  Test: troponin  Critical Value: 0.03  Name of Provider Notified: Alvino Chapel  Orders Received? Or Actions Taken?: no orders at this time.

## 2018-02-12 ENCOUNTER — Ambulatory Visit (INDEPENDENT_AMBULATORY_CARE_PROVIDER_SITE_OTHER): Payer: Managed Care, Other (non HMO) | Admitting: Adult Health

## 2018-02-12 ENCOUNTER — Encounter: Payer: Self-pay | Admitting: Adult Health

## 2018-02-12 VITALS — BP 110/70 | HR 72 | Temp 97.7°F | Ht 74.0 in | Wt 245.0 lb

## 2018-02-12 DIAGNOSIS — F419 Anxiety disorder, unspecified: Secondary | ICD-10-CM | POA: Diagnosis not present

## 2018-02-12 DIAGNOSIS — I10 Essential (primary) hypertension: Secondary | ICD-10-CM

## 2018-02-12 DIAGNOSIS — I471 Supraventricular tachycardia: Secondary | ICD-10-CM | POA: Diagnosis not present

## 2018-02-12 MED ORDER — ALPRAZOLAM 0.5 MG PO TABS: 0.5000 mg | ORAL_TABLET | Freq: Every evening | ORAL | 1 refills | Status: DC | PRN

## 2018-02-12 MED ORDER — FUROSEMIDE 80 MG PO TABS: ORAL_TABLET | ORAL | 11 refills | Status: DC

## 2018-02-12 MED ORDER — BUSPIRONE HCL 10 MG PO TABS: 10.0000 mg | ORAL_TABLET | Freq: Three times a day (TID) | ORAL | 0 refills | Status: DC

## 2018-02-12 MED ORDER — SERTRALINE HCL 100 MG PO TABS: 100.0000 mg | ORAL_TABLET | Freq: Every day | ORAL | 0 refills | Status: DC

## 2018-02-12 NOTE — Patient Instructions (Signed)
Take xanax 0.5 mg tab if having panic attack - need something NOW  Start buspar 1/2 tab twice daily x 7 days, then increase to full tab twice daily   Start zoloft 1/2 tab (50 mg) once daily for 2 weeks, then increase to full tab daily     Buspirone tablets What is this medicine? BUSPIRONE (byoo SPYE rone) is used to treat anxiety disorders. This medicine may be used for other purposes; ask your health care provider or pharmacist if you have questions. COMMON BRAND NAME(S): BuSpar What should I tell my health care provider before I take this medicine? They need to know if you have any of these conditions: -kidney or liver disease -an unusual or allergic reaction to buspirone, other medicines, foods, dyes, or preservatives -pregnant or trying to get pregnant -breast-feeding How should I use this medicine? Take this medicine by mouth with a glass of water. Follow the directions on the prescription label. You may take this medicine with or without food. To ensure that this medicine always works the same way for you, you should take it either always with or always without food. Take your doses at regular intervals. Do not take your medicine more often than directed. Do not stop taking except on the advice of your doctor or health care professional. Talk to your pediatrician regarding the use of this medicine in children. Special care may be needed. Overdosage: If you think you have taken too much of this medicine contact a poison control center or emergency room at once. NOTE: This medicine is only for you. Do not share this medicine with others. What if I miss a dose? If you miss a dose, take it as soon as you can. If it is almost time for your next dose, take only that dose. Do not take double or extra doses. What may interact with this medicine? Do not take this medicine with any of the following medications: -linezolid -MAOIs like Carbex, Eldepryl, Marplan, Nardil, and Parnate -methylene  blue -procarbazine This medicine may also interact with the following medications: -diazepam -digoxin -diltiazem -erythromycin -grapefruit juice -haloperidol -medicines for mental depression or mood problems -medicines for seizures like carbamazepine, phenobarbital and phenytoin -nefazodone -other medications for anxiety -rifampin -ritonavir -some antifungal medicines like itraconazole, ketoconazole, and voriconazole -verapamil -warfarin This list may not describe all possible interactions. Give your health care provider a list of all the medicines, herbs, non-prescription drugs, or dietary supplements you use. Also tell them if you smoke, drink alcohol, or use illegal drugs. Some items may interact with your medicine. What should I watch for while using this medicine? Visit your doctor or health care professional for regular checks on your progress. It may take 1 to 2 weeks before your anxiety gets better. You may get drowsy or dizzy. Do not drive, use machinery, or do anything that needs mental alertness until you know how this drug affects you. Do not stand or sit up quickly, especially if you are an older patient. This reduces the risk of dizzy or fainting spells. Alcohol can make you more drowsy and dizzy. Avoid alcoholic drinks. What side effects may I notice from receiving this medicine? Side effects that you should report to your doctor or health care professional as soon as possible: -blurred vision or other vision changes -chest pain -confusion -difficulty breathing -feelings of hostility or anger -muscle aches and pains -numbness or tingling in hands or feet -ringing in the ears -skin rash and itching -vomiting -weakness Side effects  that usually do not require medical attention (report to your doctor or health care professional if they continue or are bothersome): -disturbed dreams, nightmares -headache -nausea -restlessness or nervousness -sore throat and nasal  congestion -stomach upset This list may not describe all possible side effects. Call your doctor for medical advice about side effects. You may report side effects to FDA at 1-800-FDA-1088. Where should I keep my medicine? Keep out of the reach of children. Store at room temperature below 30 degrees C (86 degrees F). Protect from light. Keep container tightly closed. Throw away any unused medicine after the expiration date. NOTE: This sheet is a summary. It may not cover all possible information. If you have questions about this medicine, talk to your doctor, pharmacist, or health care provider.  2019 Elsevier/Gold Standard (2009-09-03 18:06:11) Sertraline tablets What is this medicine? SERTRALINE (SER tra leen) is used to treat depression. It may also be used to treat obsessive compulsive disorder, panic disorder, post-trauma stress, premenstrual dysphoric disorder (PMDD) or social anxiety. This medicine may be used for other purposes; ask your health care provider or pharmacist if you have questions. COMMON BRAND NAME(S): Zoloft What should I tell my health care provider before I take this medicine? They need to know if you have any of these conditions: -bleeding disorders -bipolar disorder or a family history of bipolar disorder -glaucoma -heart disease -high blood pressure -history of irregular heartbeat -history of low levels of calcium, magnesium, or potassium in the blood -if you often drink alcohol -liver disease -receiving electroconvulsive therapy -seizures -suicidal thoughts, plans, or attempt; a previous suicide attempt by you or a family member -take medicines that treat or prevent blood clots -thyroid disease -an unusual or allergic reaction to sertraline, other medicines, foods, dyes, or preservatives -pregnant or trying to get pregnant -breast-feeding How should I use this medicine? Take this medicine by mouth with a glass of water. Follow the directions on the  prescription label. You can take it with or without food. Take your medicine at regular intervals. Do not take your medicine more often than directed. Do not stop taking this medicine suddenly except upon the advice of your doctor. Stopping this medicine too quickly may cause serious side effects or your condition may worsen. A special MedGuide will be given to you by the pharmacist with each prescription and refill. Be sure to read this information carefully each time. Talk to your pediatrician regarding the use of this medicine in children. While this drug may be prescribed for children as young as 7 years for selected conditions, precautions do apply. Overdosage: If you think you have taken too much of this medicine contact a poison control center or emergency room at once. NOTE: This medicine is only for you. Do not share this medicine with others. What if I miss a dose? If you miss a dose, take it as soon as you can. If it is almost time for your next dose, take only that dose. Do not take double or extra doses. What may interact with this medicine? Do not take this medicine with any of the following medications: -cisapride -dofetilide -dronedarone -linezolid -MAOIs like Carbex, Eldepryl, Marplan, Nardil, and Parnate -methylene blue (injected into a vein) -pimozide -thioridazine This medicine may also interact with the following medications: -alcohol -amphetamines -aspirin and aspirin-like medicines -certain medicines for depression, anxiety, or psychotic disturbances -certain medicines for fungal infections like ketoconazole, fluconazole, posaconazole, and itraconazole -certain medicines for irregular heart beat like flecainide, quinidine, propafenone -certain medicines  for migraine headaches like almotriptan, eletriptan, frovatriptan, naratriptan, rizatriptan, sumatriptan, zolmitriptan -certain medicines for sleep -certain medicines for seizures like carbamazepine, valproic acid,  phenytoin -certain medicines that treat or prevent blood clots like warfarin, enoxaparin, dalteparin -cimetidine -digoxin -diuretics -fentanyl -isoniazid -lithium -NSAIDs, medicines for pain and inflammation, like ibuprofen or naproxen -other medicines that prolong the QT interval (cause an abnormal heart rhythm) -rasagiline -safinamide -supplements like St. John's wort, kava kava, valerian -tolbutamide -tramadol -tryptophan This list may not describe all possible interactions. Give your health care provider a list of all the medicines, herbs, non-prescription drugs, or dietary supplements you use. Also tell them if you smoke, drink alcohol, or use illegal drugs. Some items may interact with your medicine. What should I watch for while using this medicine? Tell your doctor if your symptoms do not get better or if they get worse. Visit your doctor or health care professional for regular checks on your progress. Because it may take several weeks to see the full effects of this medicine, it is important to continue your treatment as prescribed by your doctor. Patients and their families should watch out for new or worsening thoughts of suicide or depression. Also watch out for sudden changes in feelings such as feeling anxious, agitated, panicky, irritable, hostile, aggressive, impulsive, severely restless, overly excited and hyperactive, or not being able to sleep. If this happens, especially at the beginning of treatment or after a change in dose, call your health care professional. Dennis Bast may get drowsy or dizzy. Do not drive, use machinery, or do anything that needs mental alertness until you know how this medicine affects you. Do not stand or sit up quickly, especially if you are an older patient. This reduces the risk of dizzy or fainting spells. Alcohol may interfere with the effect of this medicine. Avoid alcoholic drinks. Your mouth may get dry. Chewing sugarless gum or sucking hard candy,  and drinking plenty of water may help. Contact your doctor if the problem does not go away or is severe. What side effects may I notice from receiving this medicine? Side effects that you should report to your doctor or health care professional as soon as possible: -allergic reactions like skin rash, itching or hives, swelling of the face, lips, or tongue -anxious -black, tarry stools -changes in vision -confusion -elevated mood, decreased need for sleep, racing thoughts, impulsive behavior -eye pain -fast, irregular heartbeat -feeling faint or lightheaded, falls -feeling agitated, angry, or irritable -hallucination, loss of contact with reality -loss of balance or coordination -loss of memory -painful or prolonged erections -restlessness, pacing, inability to keep still -seizures -stiff muscles -suicidal thoughts or other mood changes -trouble sleeping -unusual bleeding or bruising -unusually weak or tired -vomiting Side effects that usually do not require medical attention (report to your doctor or health care professional if they continue or are bothersome): -change in appetite or weight -change in sex drive or performance -diarrhea -increased sweating -indigestion, nausea -tremors This list may not describe all possible side effects. Call your doctor for medical advice about side effects. You may report side effects to FDA at 1-800-FDA-1088. Where should I keep my medicine? Keep out of the reach of children. Store at room temperature between 15 and 30 degrees C (59 and 86 degrees F). Throw away any unused medicine after the expiration date. NOTE: This sheet is a summary. It may not cover all possible information. If you have questions about this medicine, talk to your doctor, pharmacist, or health care provider.  2019 Elsevier/Gold Standard (2016-01-29 14:17:49)   Please be aware that some of the medications that you are on can sometimes cause a rare and potentially  dangerous adverse reaction, called SEROTONIN SYNDROME: Symptoms of this condition include (but are not limited to):  Agitation or restlessness, confusion, rapid heart rate and high blood pressure, dilated pupils, loss of muscle coordination or twitching muscles, muscle rigidity/stiffness, sweating and/or flushing, diarrhea, headache, shivering, goose bumps. If you have any of these symptoms you may have to stop the medication. Call your health care provider immediately.  Severe serotonin syndrome can be life-threatening emergency. Signs and symptoms of a severe reaction may include: high fever, seizures, irregular heartbeat, unconsciousness or altered level of awareness or personality changes.  If you have any of these new symptoms, call 911 or have someone take you to the emergency room.

## 2018-02-12 NOTE — Progress Notes (Signed)
Assessment and Plan:  Tyler Berger was seen today for tachycardia and anxiety.  Diagnoses and all orders for this visit:  Anxiety Start new medication as prescribed Stress management techniques discussed, increase water, good sleep hygiene discussed, increase exercise, and increase veggies.  Follow up 6 weeks, call the office if any new AE's from medications and we will switch them Slow taper up for medications given on AVS and discussed with patient Cautioned serotonin syndrome, information provided -     ALPRAZolam (XANAX) 0.5 MG tablet; Take 1 tablet (0.5 mg total) by mouth at bedtime as needed for anxiety. -     sertraline (ZOLOFT) 100 MG tablet; Take 1 tablet (100 mg total) by mouth daily. -     busPIRone (BUSPAR) 10 MG tablet; Take 1 tablet (10 mg total) by mouth 3 (three) times daily.  Essential hypertension Continue medication Monitor blood pressure at home; call if consistently over 130/80 Continue DASH diet.   Reminder to go to the ER if any CP, SOB, nausea, dizziness, severe HA, changes vision/speech, left arm numbness and tingling and jaw pain. -     furosemide (LASIX) 80 MG tablet; TAKE ONE-HALF TO ONE TABLET BY MOUTH ONCE DAILY FOR BLOOD PRESSURE AND  FLUID  SVT (supraventricular tachycardia) ([place]) Patient will schedule follow up with Dr. Percival Spanish as recommended. Declines referral today. Present to ED for any similar episodes or call back to office if unsure.   Further disposition pending results of labs. Discussed med's effects and SE's.   Over 30 minutes of exam, counseling, chart review, and critical decision making was performed.   Future Appointments  Date Time Provider [place]  02/16/2018  9:00 AM Minus Breeding, MD [facility]  03/26/2018  4:00 PM Liane Comber, NP [facility] None  06/06/2018  3:00 PM Vicie Mutters, PA-C [facility] None  12/04/2018  3:45 PM Wilma Flavin, Caryl Pina, NP [facility] None     ------------------------------------------------------------------------------------------------------------------   HPI BP 110/70   Pulse 72   Temp 97.7 F (36.5 C)   Ht 6\' 2"  (1.88 m)   Wt 245 lb (111.1 kg)   SpO2 97%   BMI 31.46 kg/m   57 y.o.male, smoker (1/2 pack daily) with LBBB, hx myocarditis, bicuspid aortic valve (est with Dr. Percival Spanish) and anxiety presents for follow up after being seen in the ED for tachycardia on 1/2; reports he missed his atenolol dose on 1/2, was going to pick it up but had sensation of racing heart, shortness of breath, fatigue and presented to ED where they noted SVT, HR 200 with shortness of breath. He had workup including CBC, BMP, serial troponin, mag, TSH, CXR that was unremarkable excepting ? SVT, rate 212 on EKG. He was given metoprolol tartrate 5 mg IV and atenolol oral 100 mg that day which resolved tachycardia and was discharged with recommendation to follow up with Dr. Percival Spanish which he admits he hasn't scheduled. He denies episodes of palpitations or racing heart since then, but reports severe anxiety that is persisting.   He reports "I have just not been myself" since October of 2019, was seen for URI but anxiety and sense of shortness of breath never quite resolved. He was previously prescribed xanax 0.5 mg earlier in 2019 for anxiety, never started on daily agent, and reports he has been out of medication. He reports good social connections and relationship with wife and children, but admits to very stressful job that he has been frustrated with.   Today he has GAD 10 and PHQ-9 of  10 as well.      Past Medical History:  Diagnosis Date  . Anxiety   . Hyperlipidemia   . Hypertension   . Viral myocarditis 2004  . Vitamin D deficiency      No Known Allergies  Current Outpatient Medications on File Prior to Visit  Medication Sig  . acetaminophen (TYLENOL) 500 MG tablet Take 1,000 mg by mouth every 6 (six) hours as needed for mild  pain or headache.  Marland Kitchen aspirin 81 MG chewable tablet Chew by mouth daily.  Marland Kitchen atenolol (TENORMIN) 100 MG tablet Take 1 tablet (100 mg total) by mouth daily.  . Cholecalciferol (VITAMIN D PO) Take 10,000 Units by mouth daily.  Marland Kitchen lisinopril (PRINIVIL,ZESTRIL) 20 MG tablet Take 1 tablet (20 mg total) by mouth 2 (two) times daily. (Patient taking differently: Take 20 mg by mouth daily. )  . fluticasone furoate-vilanterol (BREO ELLIPTA) 100-25 MCG/INH AEPB Inhale 1 puff into the lungs daily. Rinse mouth with water after each use (Patient not taking: Reported on 02/12/2018)   No current facility-administered medications on file prior to visit.     ROS: all negative except above.   Physical Exam:  BP 110/70   Pulse 72   Temp 97.7 F (36.5 C)   Ht 6\' 2"  (1.88 m)   Wt 245 lb (111.1 kg)   SpO2 97%   BMI 31.46 kg/m   General Appearance: Well nourished, in no apparent distress. Eyes: PERRLA, conjunctiva no swelling or erythema Sinuses: No Frontal/maxillary tenderness ENT/Mouth: No erythema, swelling, or exudate on post pharynx.  Tonsils not swollen or erythematous. Hearing normal.  Neck: Supple, thyroid normal.  Respiratory: Respiratory effort normal, BS equal bilaterally without rales, rhonchi, wheezing or stridor.  Cardio: RRR with 4/6 murmur. Brisk peripheral pulses without edema. Extensive vericose veins to bilateral lower legs.  Abdomen: Soft, + BS.  Non tender, no guarding, rebound, hernias, masses. Lymphatics: Non tender without lymphadenopathy.  Musculoskeletal: Full ROM, 5/5 strength, normal gait.  Skin: Warm, dry without rashes, lesions, ecchymosis.  Neuro: Cranial nerves intact. Normal muscle tone, no cerebellar symptoms. Sensation intact.  Psych: Awake and oriented X 3, anxious/tearful affect, Insight and Judgment appropriate.     Izora Ribas, NP 6:02 PM [facility]

## 2018-02-15 NOTE — Progress Notes (Signed)
Cardiology Office Note   Date:  02/16/2018   ID:  Tyler Berger, DOB [birth year], MRN [id number]  PCP:  Unk Pinto, MD  Cardiologist:   No primary care provider on file. Referring:  ED MD  Chief Complaint  Patient presents with  . Tachycardia      History of Present Illness: Tyler Berger is a 58 y.o. male who is referred by the ED for evaluation of SVT.    He was in the ED on 1/2 for this and I reviewed this records for this visit.   On presentation his HR was 200.   He had not taken his atenolol for a couple of days.  He converted spontaneously to NSR but had recurrent SVT and was given beta blocker.  He did have a minimally elevated troponin.   I saw him many years ago.    In 2004 he had an EF of 20%.  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  He was most recently seen in 2012 by another provider.  I reviewed these records.  He had some chest discomfort.  He had a negative stress test.  His ejection fraction had recovered to 60 to 65%.  There was moderate aortic stenosis with a valve gradient of 23 mm.  He had stress perfusion study that demonstrated no ischemia.  He has not been back for follow-up.  He said he actually feels fairly well.  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  The day before he presented to the emergency room he noted that his heart was racing.  This went on and off throughout the day.  He just tried to put up with it.  It did not seem like it did go away spells.  However, the second day he presented to work and he noted his heart rate to be very rapid and so presented to the emergency room where he was found to have a heart rate as above.  He did not have chest discomfort.  He did not particularly have shortness of breath.  He just felt his heart thumping.  He did not have any presyncope or syncope.  He is not really been having this before.  He otherwise is been feeling well.  He denies excessive shortness of breath, PND or orthopnea.  He has had  no weight gain or edema.    Past Medical History:  Diagnosis Date  . Anxiety   . Hypertension   . Viral myocarditis 2004  . Vitamin D deficiency     Past Surgical History:  Procedure Laterality Date  . HERNIA REPAIR Left 2010  . ORCHIECTOMY Left 1982  . VARICOSE VEIN SURGERY Left 05/1998     Current Outpatient Medications  Medication Sig Dispense Refill  . acetaminophen (TYLENOL) 500 MG tablet Take 1,000 mg by mouth every 6 (six) hours as needed for mild pain or headache.    . ALPRAZolam (XANAX) 0.5 MG tablet Take 1 tablet (0.5 mg total) by mouth at bedtime as needed for anxiety. 30 tablet 1  . aspirin 81 MG chewable tablet Chew by mouth daily.    . busPIRone (BUSPAR) 10 MG tablet Take 1 tablet (10 mg total) by mouth 3 (three) times daily. 120 tablet 0  . Cholecalciferol (VITAMIN D PO) Take 10,000 Units by mouth daily.    . fluticasone furoate-vilanterol (BREO ELLIPTA) 100-25 MCG/INH AEPB Inhale 1 puff into the lungs daily. Rinse mouth with water after each use 1 each  0  . furosemide (LASIX) 80 MG tablet TAKE ONE-HALF TO ONE TABLET BY MOUTH ONCE DAILY FOR BLOOD PRESSURE AND  FLUID 30 tablet 11  . lisinopril (PRINIVIL,ZESTRIL) 20 MG tablet Take 1 tablet (20 mg total) by mouth 2 (two) times daily. (Patient taking differently: Take 20 mg by mouth daily. ) 60 tablet 11  . sertraline (ZOLOFT) 100 MG tablet Take 1 tablet (100 mg total) by mouth daily. 60 tablet 0  . metoprolol succinate (TOPROL-XL) 100 MG 24 hr tablet Take 1 tablet (100 mg total) by mouth daily. Take with or immediately following a meal. 90 tablet 3   No current facility-administered medications for this visit.     Allergies:   Patient has no known allergies.    Social History:  The patient  reports that he has been smoking cigarettes. He has a 22.50 pack-year smoking history. He has never used smokeless tobacco. He reports current alcohol use of about 2.0 standard drinks of alcohol per week. He reports that he does  not use drugs.   Family History:  The patient's family history includes Alzheimer's disease in his mother; Cancer in his father and mother; Diabetes in his mother; Hypertension in his father, mother, and sister.    ROS:  Please see the history of present illness.   Otherwise, review of systems are positive for none.   All other systems are reviewed and negative.    PHYSICAL EXAM: VS:  BP 106/72 (BP Location: Right Arm)   Pulse 72   Ht 6\' 3"  (1.905 m)   Wt 238 lb 9.6 oz (108.2 kg)   SpO2 97%   BMI 29.82 kg/m  , BMI Body mass index is 29.82 kg/m. GENERAL:  Well appearing HEENT:  Pupils equal round and reactive, fundi not visualized, oral mucosa unremarkable NECK:  No jugular venous distention, waveform within normal limits, carotid upstroke brisk and symmetric, no bruits, no thyromegaly LYMPHATICS:  No cervical, inguinal adenopathy LUNGS:  Clear to auscultation bilaterally BACK:  No CVA tenderness CHEST:  Unremarkable HEART:  PMI not displaced or sustained,S1 and S2 within normal limits, no S3, no S4, no clicks, no rubs, 3 out of 6 systolic murmur radiating at the aortic outflow tract and up into the carotids, no diastolic murmurs ABD:  Flat, positive bowel sounds normal in frequency in pitch, no bruits, no rebound, no guarding, no midline pulsatile mass, no hepatomegaly, no splenomegaly EXT:  2 plus pulses throughout, no edema, no cyanosis no clubbing, significant varicosities and chronic venous stasis changes SKIN:  No rashes no nodules NEURO:  Cranial nerves II through XII grossly intact, motor grossly intact throughout PSYCH:  Cognitively intact, oriented to person place and time    EKG:  EKG is ordered today. The ekg ordered today demonstrates sinus rhythm, interventricular conduction delay, premature ectopic complexes, left ventricular hypertrophy by voltage criteria   Recent Labs: 12/11/2017: ALT 20 02/08/2018: BUN 23; Creatinine, Ser 1.06; Hemoglobin 17.7; Magnesium 2.2;  Platelets 123; Potassium 3.9; Sodium 138; TSH 1.081    Lipid Panel    Component Value Date/Time   CHOL 171 06/01/2017 1548   TRIG 123 06/01/2017 1548   HDL 45 06/01/2017 1548   CHOLHDL 3.8 06/01/2017 1548   VLDL 55 (H) 08/26/2013 1234   LDLCALC 104 (H) 06/01/2017 1548      Wt Readings from Last 3 Encounters:  02/16/18 238 lb 9.6 oz (108.2 kg)  02/12/18 245 lb (111.1 kg)  02/08/18 237 lb (107.5 kg)  Other studies Reviewed: Additional studies/ records that were reviewed today include: ED records, old echocardiogram and previous hospitalization records. Review of the above records demonstrates:  Please see elsewhere in the note.     ASSESSMENT AND PLAN:  SVT: I do agree that this is probably SVT.  I reviewed with Dr. Rayann Heman.  He was off his atenolol for 1 day.  I am can actually change in the Toprol-XL about once a day drug starting with 100 mg.  I talked him about Valsalva maneuvers.  I talked to him about presenting to the emergency room should he have any recurrent symptoms.  He has short paroxysms I probably would titrate his beta-blocker further.  He might be a candidate for ablation.  I have a low suspicion that this is a ventricular arrhythmia.  AORTIC STENOSIS: He is overdue for echocardiography.  He agrees to present with this with him he is somewhat reluctant because cost is an issue.  HTN: This will be managed in the context of treating his arrhythmia.  TOBACCO ABUSE: We had a long discussion about this.  He knows he needs to quit but is not ready at this point.   Current medicines are reviewed at length with the patient today.  The patient does not have concerns regarding medicines.  The following changes have been made:  As above  Labs/ tests ordered today include:   Orders Placed This Encounter  Procedures  . ECHOCARDIOGRAM COMPLETE     Disposition:   FU with me in six weeks.      Signed, Minus Breeding, MD  02/16/2018 10:22 AM    [facility]

## 2018-02-16 ENCOUNTER — Ambulatory Visit: Payer: Managed Care, Other (non HMO) | Admitting: Cardiology

## 2018-02-16 ENCOUNTER — Encounter: Payer: Self-pay | Admitting: Cardiology

## 2018-02-16 VITALS — BP 106/72 | HR 72 | Ht 75.0 in | Wt 238.6 lb

## 2018-02-16 DIAGNOSIS — I471 Supraventricular tachycardia, unspecified: Secondary | ICD-10-CM | POA: Insufficient documentation

## 2018-02-16 DIAGNOSIS — Q231 Congenital insufficiency of aortic valve: Secondary | ICD-10-CM

## 2018-02-16 DIAGNOSIS — Z720 Tobacco use: Secondary | ICD-10-CM

## 2018-02-16 DIAGNOSIS — I10 Essential (primary) hypertension: Secondary | ICD-10-CM

## 2018-02-16 DIAGNOSIS — Z87891 Personal history of nicotine dependence: Secondary | ICD-10-CM | POA: Insufficient documentation

## 2018-02-16 MED ORDER — METOPROLOL SUCCINATE ER 100 MG PO TB24: 100.0000 mg | ORAL_TABLET | Freq: Every day | ORAL | 3 refills | Status: DC

## 2018-02-16 NOTE — Patient Instructions (Signed)
Medication Instructions:  STOP- Atenolol START- Metoprolol 100 mg daily  If you need a refill on your cardiac medications before your next appointment, please call your pharmacy.  Labwork: None Ordered   Take the provided lab slips with you to the lab for your blood draw.  When you have your labs (blood work) drawn today and your tests are completely normal, you will receive your results only by MyChart Message (if you have MyChart) -OR-  A paper copy in the mail.  If you have any lab test that is abnormal or we need to change your treatment, we will call you to review these results.  Testing/Procedures: Your physician has requested that you have an echocardiogram. Echocardiography is a painless test that uses sound waves to create images of your heart. It provides your doctor with information about the size and shape of your heart and how well your heart's chambers and valves are working. This procedure takes approximately one hour. There are no restrictions for this procedure.   Follow-Up: . Your physician recommends that you schedule a follow-up appointment in: [place], you and your health needs are our priority.  As part of our continuing mission to provide you with exceptional heart care, we have created designated Provider Care Teams.  These Care Teams include your primary Cardiologist (physician) and Advanced Practice Providers (APPs -  Physician Assistants and Nurse Practitioners) who all work together to provide you with the care you need, when you need it.  Thank you for choosing [facility] at [facility]!!

## 2018-02-20 NOTE — Addendum Note (Signed)
Addended by: Alvina Filbert B on: 02/20/2018 12:24 PM   Modules accepted: Orders

## 2018-03-02 ENCOUNTER — Ambulatory Visit (HOSPITAL_COMMUNITY): Payer: Managed Care, Other (non HMO) | Attending: Cardiovascular Disease

## 2018-03-02 ENCOUNTER — Other Ambulatory Visit: Payer: Self-pay

## 2018-03-02 DIAGNOSIS — Q231 Congenital insufficiency of aortic valve: Secondary | ICD-10-CM

## 2018-03-05 ENCOUNTER — Telehealth: Payer: Self-pay | Admitting: Cardiology

## 2018-03-05 NOTE — Telephone Encounter (Signed)
New Message   Patient returning Tyler Berger phone call about echo results.

## 2018-03-05 NOTE — Telephone Encounter (Signed)
Called patient, states he already spoke with [place] regarding results.

## 2018-03-08 ENCOUNTER — Encounter: Payer: Self-pay | Admitting: Cardiology

## 2018-03-22 ENCOUNTER — Encounter: Payer: Self-pay | Admitting: Cardiology

## 2018-03-22 ENCOUNTER — Telehealth: Payer: Self-pay | Admitting: Cardiology

## 2018-03-22 ENCOUNTER — Ambulatory Visit: Payer: Managed Care, Other (non HMO) | Admitting: Cardiology

## 2018-03-22 VITALS — BP 105/68 | HR 94 | Ht 75.0 in | Wt 236.2 lb

## 2018-03-22 DIAGNOSIS — Z720 Tobacco use: Secondary | ICD-10-CM

## 2018-03-22 DIAGNOSIS — I420 Dilated cardiomyopathy: Secondary | ICD-10-CM | POA: Diagnosis not present

## 2018-03-22 DIAGNOSIS — I350 Nonrheumatic aortic (valve) stenosis: Secondary | ICD-10-CM

## 2018-03-22 DIAGNOSIS — I471 Supraventricular tachycardia: Secondary | ICD-10-CM

## 2018-03-22 DIAGNOSIS — Z01812 Encounter for preprocedural laboratory examination: Secondary | ICD-10-CM

## 2018-03-22 MED ORDER — METOPROLOL SUCCINATE ER 100 MG PO TB24: 100.0000 mg | ORAL_TABLET | Freq: Two times a day (BID) | ORAL | 3 refills | Status: DC

## 2018-03-22 NOTE — H&P (View-Only) (Signed)
Cardiology Office Note   Date:  03/23/2018   ID:  Tyler Berger, DOB Mar 27, 1960, MRN [id number]  PCP:  Unk Pinto, MD  Cardiologist:   No primary care provider on file. Referring:  ED MD  Chief Complaint  Patient presents with  . Shortness of Breath      History of Present Illness: Tyler Berger is a 58 y.o. male who is referred by the ED for evaluation of SVT.     On presentation his HR was 200.   He had not taken his atenolol for a couple of days.  He converted spontaneously to NSR but had recurrent SVT and was given beta blocker.  He did have a minimally elevated troponin.  Prior to this recent visit I had seen him many years ago.    In 2004 he had an EF of 20%.  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  He was most recently seen in 2012 by another provider.  I reviewed these records.  He had some chest discomfort.  He had a negative stress test.  His ejection fraction had recovered to 60 to 65%.  There was moderate aortic stenosis with a valve gradient of 23 mm.  He had stress perfusion study that demonstrated no ischemia.  He had not been back for follow-up.  At the last visit I changed him to metoprolol from the atenolol that he had been taking.  I sent him for an echocardiogram that demonstrated that his ejection fraction was now back down to 25 to 30%.  There is questionably severe aortic stenosis.  There is severe calcification.  He comes back to talk about this.  Since I saw him he has had no new shortness of breath.  He has some chronic dyspnea.  He still drinks wine.  Previously when he had a reduced ejection fraction he had been drinking more alcohol.  He does not think that he is abusing it is now.  He is taking his medications as he always has been.  He tolerated the metoprolol.  He does report that he had some increased heart rate with tachypalpitations similar to that which brought him to the emergency room.  He tried vagal maneuvers.  They were not as  sustained as they have been previously.  He has not had these in a few days however.  He did take an extra 50 mg of metoprolol when it happened last time.    Past Medical History:  Diagnosis Date  . Anxiety   . Hypertension   . Viral myocarditis 2004  . Vitamin D deficiency     Past Surgical History:  Procedure Laterality Date  . HERNIA REPAIR Left 2010  . ORCHIECTOMY Left 1982  . VARICOSE VEIN SURGERY Left 05/1998     Current Outpatient Medications  Medication Sig Dispense Refill  . acetaminophen (TYLENOL) 500 MG tablet Take 1,000 mg by mouth every 6 (six) hours as needed for mild pain or headache.    . ALPRAZolam (XANAX) 0.5 MG tablet Take 1 tablet (0.5 mg total) by mouth at bedtime as needed for anxiety. 30 tablet 1  . aspirin 81 MG chewable tablet Chew by mouth daily.    . busPIRone (BUSPAR) 10 MG tablet Take 1 tablet (10 mg total) by mouth 3 (three) times daily. 120 tablet 0  . Cholecalciferol (VITAMIN D PO) Take 10,000 Units by mouth daily.    . fluticasone furoate-vilanterol (BREO ELLIPTA) 100-25 MCG/INH AEPB Inhale 1 puff into the  lungs daily. Rinse mouth with water after each use 1 each 0  . furosemide (LASIX) 80 MG tablet TAKE ONE-HALF TO ONE TABLET BY MOUTH ONCE DAILY FOR BLOOD PRESSURE AND  FLUID 30 tablet 11  . lisinopril (PRINIVIL,ZESTRIL) 20 MG tablet Take 1 tablet (20 mg total) by mouth 2 (two) times daily. (Patient taking differently: Take 20 mg by mouth daily. ) 60 tablet 11  . metoprolol succinate (TOPROL-XL) 100 MG 24 hr tablet Take 1 tablet (100 mg total) by mouth 2 (two) times daily. Take with or immediately following a meal. 180 tablet 3  . sertraline (ZOLOFT) 100 MG tablet Take 1 tablet (100 mg total) by mouth daily. 60 tablet 0   No current facility-administered medications for this visit.     Allergies:   Patient has no known allergies.     ROS:  Please see the history of present illness.   Otherwise, review of systems are positive for none.   All  other systems are reviewed and negative.    PHYSICAL EXAM: VS:  BP 105/68   Pulse 94   Ht 6\' 3"  (1.905 m)   Wt 236 lb 3.2 oz (107.1 kg)   BMI 29.52 kg/m  , BMI Body mass index is 29.52 kg/m. GENERAL:  Well appearing NECK:  No jugular venous distention, waveform within normal limits, carotid upstroke brisk and symmetric, no bruits, no thyromegaly LUNGS:  Clear to auscultation bilaterally CHEST:  Unremarkable HEART:  PMI not displaced or sustained,S1 and S2 within normal limits, no S3, no S4, no clicks, no rubs, 3 out of 6 apical systolic murmur radiating out the aortic outflow tract, no diastolic murmurs ABD:  Flat, positive bowel sounds normal in frequency in pitch, no bruits, no rebound, no guarding, no midline pulsatile mass, no hepatomegaly, no splenomegaly EXT:  2 plus pulses throughout, no edema, no cyanosis no clubbing, varicose veins    EKG:  EKG is not ordered today.    Recent Labs: 12/11/2017: ALT 20 02/08/2018: BUN 23; Creatinine, Ser 1.06; Hemoglobin 17.7; Magnesium 2.2; Platelets 123; Potassium 3.9; Sodium 138; TSH 1.081    Lipid Panel    Component Value Date/Time   CHOL 171 06/01/2017 1548   TRIG 123 06/01/2017 1548   HDL 45 06/01/2017 1548   CHOLHDL 3.8 06/01/2017 1548   VLDL 55 (H) 08/26/2013 1234   LDLCALC 104 (H) 06/01/2017 1548      Wt Readings from Last 3 Encounters:  03/22/18 236 lb 3.2 oz (107.1 kg)  02/16/18 238 lb 9.6 oz (108.2 kg)  02/12/18 245 lb (111.1 kg)      Other studies Reviewed: Additional studies/ records that were reviewed today include: Echo Review of the above records demonstrates:  Please see elsewhere in the note.     ASSESSMENT AND PLAN:  SVT: I previously reviewed this with Dr. Rayann Heman.  It does appear to be SVT rather than VT.  And then increase his metoprolol for management of both his cardiomyopathy and his SVT.   AORTIC STENOSIS: He appears to have severe aortic stenosis.  I am to send him for right and left heart  catheterization possibly with dobutamine to further evaluate this.  He came today with his son.  They were emotional as we discussed the possible need for aortic valve replacement in the months to come.   HTN:  This is being managed in the context of treating his CHF  ACUTE SYSTOLIC HF: He needs right and left heart cath.  I am going  to increase his beta-blocker as above.  The next step would be to switch to [facility].  He is advised to completely quit drinking alcohol as this could be related.  He will have an ischemia evaluation with the catheterization as above.  TOBACCO ABUSE: He understands the need to stop smoking and we discussed this.   Current medicines are reviewed at length with the patient today.  The patient does not have concerns regarding medicines.  The following changes have been made:  As above  Labs/ tests ordered today include:   Orders Placed This Encounter  Procedures  . CBC  . Basic Metabolic Panel (BMET)     Disposition:   FU with me after the cath.    Signed, Minus Breeding, MD  03/23/2018 11:37 AM    [facility]

## 2018-03-22 NOTE — Telephone Encounter (Signed)
New Message   PT is calling because he said he had an appt scheduled 02/13 at 320pm to see Dr Percival Spanish and he was calling to confirm because it shows the 18th. I told him it is next Tuesday the 18th. He is upset because he took off work and he has his family coming from out of town for this appt that he thought was today  Please call

## 2018-03-22 NOTE — Progress Notes (Deleted)
FOLLOW UP  Assessment and Plan:   Hypertension Well controlled with current medications  Monitor blood pressure at home; patient to call if consistently greater than 130/80 Continue DASH diet.   Reminder to go to the ER if any CP, SOB, nausea, dizziness, severe HA, changes vision/speech, left arm numbness and tingling and jaw pain.  Cholesterol Currently at goal;  Continue low cholesterol diet and exercise.  Check lipid panel.   Diabetes {with or without complications:30421263} Continue medication: Continue diet and exercise.  Perform daily foot/skin check, notify office of any concerning changes.  Check A1C  Obesity with co morbidities Long discussion about weight loss, diet, and exercise Recommended diet heavy in fruits and veggies and low in animal meats, cheeses, and dairy products, appropriate calorie intake Discussed ideal weight for height (below ***) and initial weight goal (***) Patient will work on *** Will follow up in 3 months  Vitamin D Def At goal at last visit; continue supplementation to maintain goal of 70-100 Defer Vit D level  Continue diet and meds as discussed. Further disposition pending results of labs. Discussed med's effects and SE's.   Over 30 minutes of exam, counseling, chart review, and critical decision making was performed.   Future Appointments  Date Time Provider [place]  03/26/2018  4:00 PM Liane Comber, NP [facility] None  03/27/2018  3:20 PM Minus Breeding, MD [facility]  06/06/2018  3:00 PM Vicie Mutters, PA-C [facility] None  12/04/2018  3:45 PM Liane Comber, NP [facility] None    ----------------------------------------------------------------------------------------------------------------------  HPI 58 y.o. male presents for 3 month follow up on hypertension, cholesterol, prediabetes, weight and vitamin D deficiency.   He has hx of myocarditis, bicuspid aortic valve, followed by Dr. Percival Spanish   He is  a smoker (continues with 1/2 pack daily, not interested in quitting) with LBBB, hx myocarditis, bicuspid aortic valve (est with Dr. Percival Spanish) and anxiety presents for follow up after being seen in the ED for tachycardia on 1/2; reports he missed his atenolol dose on 1/2, was going to pick it up but had sensation of racing heart, shortness of breath, fatigue and presented to ED where they noted SVT, HR 200 with shortness of breath. He had workup including CBC, BMP, serial troponin, mag, TSH, CXR that was unremarkable excepting ? SVT, rate 212 on EKG. He was given metoprolol tartrate 5 mg IV and atenolol oral 100 mg that day which resolved tachycardia and was discharged with recommendation to follow up with Dr. Percival Spanish which he admits he hasn't scheduled. He denies episodes of palpitations or racing heart since then, but reports severe anxiety that is persisting.   He was started on buspar 10 mg TID, zoloft 100 mg daily, PRN xanax 0.5 mg due to anxiety attributed to stress and frustration.   He reports good social connections and relationship with wife and children  Today he has GAD 10 and PHQ-9 of 10 as well.      BMI is There is no height or weight on file to calculate BMI., he {HAS HAS VHQ:46962} been working on diet and exercise. Wt Readings from Last 3 Encounters:  03/22/18 236 lb 3.2 oz (107.1 kg)  02/16/18 238 lb 9.6 oz (108.2 kg)  02/12/18 245 lb (111.1 kg)    His blood pressure {HAS HAS NOT:18834} been controlled at home, today their BP is    He {DOES_DOES XBM:84132} workout. He denies chest pain, shortness of breath, dizziness.   He {ACTION; IS/IS GMW:10272536} on cholesterol medication {Cholesterol  meds:21887} and denies myalgias. His cholesterol {ACTION; IS/IS NOT:21021397} at goal. The cholesterol last visit was:   Lab Results  Component Value Date   CHOL 171 06/01/2017   HDL 45 06/01/2017   LDLCALC 104 (H) 06/01/2017   TRIG 123 06/01/2017   CHOLHDL 3.8 06/01/2017    He  {Has/has not:18111} been working on diet and exercise for prediabetes, and denies {Symptoms; diabetes w/o none:19199}. Last A1C in the office was: No results found for: HGBA1C   Patient is on Vitamin D supplement.   Lab Results  Component Value Date   VD25OH 40 04/22/2013        Current Medications:  Current Outpatient Medications on File Prior to Visit  Medication Sig  . acetaminophen (TYLENOL) 500 MG tablet Take 1,000 mg by mouth every 6 (six) hours as needed for mild pain or headache.  . ALPRAZolam (XANAX) 0.5 MG tablet Take 1 tablet (0.5 mg total) by mouth at bedtime as needed for anxiety.  Marland Kitchen aspirin 81 MG chewable tablet Chew by mouth daily.  . busPIRone (BUSPAR) 10 MG tablet Take 1 tablet (10 mg total) by mouth 3 (three) times daily.  . Cholecalciferol (VITAMIN D PO) Take 10,000 Units by mouth daily.  . fluticasone furoate-vilanterol (BREO ELLIPTA) 100-25 MCG/INH AEPB Inhale 1 puff into the lungs daily. Rinse mouth with water after each use  . furosemide (LASIX) 80 MG tablet TAKE ONE-HALF TO ONE TABLET BY MOUTH ONCE DAILY FOR BLOOD PRESSURE AND  FLUID  . lisinopril (PRINIVIL,ZESTRIL) 20 MG tablet Take 1 tablet (20 mg total) by mouth 2 (two) times daily. (Patient taking differently: Take 20 mg by mouth daily. )  . metoprolol succinate (TOPROL-XL) 100 MG 24 hr tablet Take 1 tablet (100 mg total) by mouth daily. Take with or immediately following a meal.  . sertraline (ZOLOFT) 100 MG tablet Take 1 tablet (100 mg total) by mouth daily.   No current facility-administered medications on file prior to visit.      Allergies: No Known Allergies   Medical History:  Past Medical History:  Diagnosis Date  . Anxiety   . Hypertension   . Viral myocarditis 2004  . Vitamin D deficiency    Family history- Reviewed and unchanged Social history- Reviewed and unchanged   Review of Systems:  Review of Systems  Constitutional: Negative for malaise/fatigue and weight loss.  HENT: Negative  for hearing loss and tinnitus.   Eyes: Negative for blurred vision and double vision.  Respiratory: Negative for cough, shortness of breath and wheezing.   Cardiovascular: Negative for chest pain, palpitations, orthopnea, claudication and leg swelling.  Gastrointestinal: Negative for abdominal pain, blood in stool, constipation, diarrhea, heartburn, melena, nausea and vomiting.  Genitourinary: Negative.   Musculoskeletal: Negative for joint pain and myalgias.  Skin: Negative for rash.  Neurological: Negative for dizziness, tingling, sensory change, weakness and headaches.  Endo/Heme/Allergies: Negative for polydipsia.  Psychiatric/Behavioral: Negative.   All other systems reviewed and are negative.     Physical Exam: There were no vitals taken for this visit. Wt Readings from Last 3 Encounters:  03/22/18 236 lb 3.2 oz (107.1 kg)  02/16/18 238 lb 9.6 oz (108.2 kg)  02/12/18 245 lb (111.1 kg)   General Appearance: Well nourished, in no apparent distress. Eyes: PERRLA, EOMs, conjunctiva no swelling or erythema Sinuses: No Frontal/maxillary tenderness ENT/Mouth: Ext aud canals clear, TMs without erythema, bulging. No erythema, swelling, or exudate on post pharynx.  Tonsils not swollen or erythematous. Hearing normal.  Neck: Supple,  thyroid normal.  Respiratory: Respiratory effort normal, BS equal bilaterally without rales, rhonchi, wheezing or stridor.  Cardio: RRR with no MRGs. Brisk peripheral pulses without edema.  Abdomen: Soft, + BS.  Non tender, no guarding, rebound, hernias, masses. Lymphatics: Non tender without lymphadenopathy.  Musculoskeletal: Full ROM, 5/5 strength, {PSY - GAIT AND STATION:22860} gait Skin: Warm, dry without rashes, lesions, ecchymosis.  Neuro: Cranial nerves intact. No cerebellar symptoms.  Psych: Awake and oriented X 3, normal affect, Insight and Judgment appropriate.    Izora Ribas, NP 4:05 PM [facility]

## 2018-03-22 NOTE — Patient Instructions (Addendum)
Medication Instructions:  INCREASE- Metoprolol 150 mg daily  If you need a refill on your cardiac medications before your next appointment, please call your pharmacy.  Labwork: CBC and BMP HERE IN OUR OFFICE AT [facility]   You will NOT need to fast   Take the provided lab slips with you to the lab for your blood draw.   When you have your labs (blood work) drawn today and your tests are completely normal, you will receive your results only by MyChart Message (if you have MyChart) -OR-  A paper copy in the mail.  If you have any lab test that is abnormal or we need to change your treatment, we will call you to review these results.  Testing/Procedures: Your physician has requested that you have a Right and Left cardiac catheterization. Cardiac catheterization is used to diagnose and/or treat various heart conditions. Doctors may recommend this procedure for a number of different reasons. The most common reason is to evaluate chest pain. Chest pain can be a symptom of coronary artery disease (CAD), and cardiac catheterization can show whether plaque is narrowing or blocking your heart's arteries. This procedure is also used to evaluate the valves, as well as measure the blood flow and oxygen levels in different parts of your heart. For further information please visit [email or web address]. Please follow instruction sheet, as given.  Special Instructions:    [facility] [address] [place] [address] [place] [address] Dept: [phone] Loc: [place]  03/22/2018  You are scheduled for a Cardiac Catheterization  1. Please arrive at the [facility] (Main Entrance A) at [facility]: [address] [place], [place] [address] at       (This time is two hours before your procedure to ensure your preparation). Free valet parking service is available.   Special note: Every effort is made to have your  procedure done on time. Please understand that emergencies sometimes delay scheduled procedures.  2. Diet: Do not eat solid foods after midnight.  The patient may have clear liquids until 5am upon the day of the procedure.  3. Labs: You will need to have blood drawn You do not need to be fasting.  4. Medication instructions in preparation for your procedure:   Contrast Allergy: No  HOLD Lasix day of procedure  On the morning of your procedure, take your Aspirin and any morning medicines NOT listed above.  You may use sips of water.  5. Plan for one night stay--bring personal belongings. 6. Bring a current list of your medications and current insurance cards. 7. You MUST have a responsible person to drive you home. 8. Someone MUST be with you the first 24 hours after you arrive home or your discharge will be delayed. 9. Please wear clothes that are easy to get on and off and wear slip-on shoes.  Thank you for allowing [place] to care for you!   -- [facility]   Follow-Up: . Your physician recommends that you schedule a follow-up appointment in: After Cath   At [facility], you and your health needs are our priority.  As part of our continuing mission to provide you with exceptional heart care, we have created designated Provider Care Teams.  These Care Teams include your primary Cardiologist (physician) and Advanced Practice Providers (APPs -  Physician Assistants and Nurse Practitioners) who all work together to provide you with the care you need, when  you need it.  Thank you for choosing [facility] at [facility]!!

## 2018-03-22 NOTE — Telephone Encounter (Signed)
Attempted to contact pt to let him Dr. Percival Spanish had a cancellation at 4 pm today. Left message to call back.

## 2018-03-22 NOTE — Progress Notes (Signed)
Cardiology Office Note   Date:  03/23/2018   ID:  Tyler Berger, DOB 1960-03-25, MRN [id number]  PCP:  Unk Pinto, MD  Cardiologist:   No primary care provider on file. Referring:  ED MD  Chief Complaint  Patient presents with  . Shortness of Breath      History of Present Illness: Tyler Berger is a 58 y.o. male who is referred by the ED for evaluation of SVT.     On presentation his HR was 200.   He had not taken his atenolol for a couple of days.  He converted spontaneously to NSR but had recurrent SVT and was given beta blocker.  He did have a minimally elevated troponin.  Prior to this recent visit I had seen him many years ago.    In 2004 he had an EF of 20%.  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  He was most recently seen in 2012 by another provider.  I reviewed these records.  He had some chest discomfort.  He had a negative stress test.  His ejection fraction had recovered to 60 to 65%.  There was moderate aortic stenosis with a valve gradient of 23 mm.  He had stress perfusion study that demonstrated no ischemia.  He had not been back for follow-up.  At the last visit I changed him to metoprolol from the atenolol that he had been taking.  I sent him for an echocardiogram that demonstrated that his ejection fraction was now back down to 25 to 30%.  There is questionably severe aortic stenosis.  There is severe calcification.  He comes back to talk about this.  Since I saw him he has had no new shortness of breath.  He has some chronic dyspnea.  He still drinks wine.  Previously when he had a reduced ejection fraction he had been drinking more alcohol.  He does not think that he is abusing it is now.  He is taking his medications as he always has been.  He tolerated the metoprolol.  He does report that he had some increased heart rate with tachypalpitations similar to that which brought him to the emergency room.  He tried vagal maneuvers.  They were not as  sustained as they have been previously.  He has not had these in a few days however.  He did take an extra 50 mg of metoprolol when it happened last time.    Past Medical History:  Diagnosis Date  . Anxiety   . Hypertension   . Viral myocarditis 2004  . Vitamin D deficiency     Past Surgical History:  Procedure Laterality Date  . HERNIA REPAIR Left 2010  . ORCHIECTOMY Left 1982  . VARICOSE VEIN SURGERY Left 05/1998     Current Outpatient Medications  Medication Sig Dispense Refill  . acetaminophen (TYLENOL) 500 MG tablet Take 1,000 mg by mouth every 6 (six) hours as needed for mild pain or headache.    . ALPRAZolam (XANAX) 0.5 MG tablet Take 1 tablet (0.5 mg total) by mouth at bedtime as needed for anxiety. 30 tablet 1  . aspirin 81 MG chewable tablet Chew by mouth daily.    . busPIRone (BUSPAR) 10 MG tablet Take 1 tablet (10 mg total) by mouth 3 (three) times daily. 120 tablet 0  . Cholecalciferol (VITAMIN D PO) Take 10,000 Units by mouth daily.    . fluticasone furoate-vilanterol (BREO ELLIPTA) 100-25 MCG/INH AEPB Inhale 1 puff into the  lungs daily. Rinse mouth with water after each use 1 each 0  . furosemide (LASIX) 80 MG tablet TAKE ONE-HALF TO ONE TABLET BY MOUTH ONCE DAILY FOR BLOOD PRESSURE AND  FLUID 30 tablet 11  . lisinopril (PRINIVIL,ZESTRIL) 20 MG tablet Take 1 tablet (20 mg total) by mouth 2 (two) times daily. (Patient taking differently: Take 20 mg by mouth daily. ) 60 tablet 11  . metoprolol succinate (TOPROL-XL) 100 MG 24 hr tablet Take 1 tablet (100 mg total) by mouth 2 (two) times daily. Take with or immediately following a meal. 180 tablet 3  . sertraline (ZOLOFT) 100 MG tablet Take 1 tablet (100 mg total) by mouth daily. 60 tablet 0   No current facility-administered medications for this visit.     Allergies:   Patient has no known allergies.     ROS:  Please see the history of present illness.   Otherwise, review of systems are positive for none.   All  other systems are reviewed and negative.    PHYSICAL EXAM: VS:  BP 105/68   Pulse 94   Ht 6\' 3"  (1.905 m)   Wt 236 lb 3.2 oz (107.1 kg)   BMI 29.52 kg/m  , BMI Body mass index is 29.52 kg/m. GENERAL:  Well appearing NECK:  No jugular venous distention, waveform within normal limits, carotid upstroke brisk and symmetric, no bruits, no thyromegaly LUNGS:  Clear to auscultation bilaterally CHEST:  Unremarkable HEART:  PMI not displaced or sustained,S1 and S2 within normal limits, no S3, no S4, no clicks, no rubs, 3 out of 6 apical systolic murmur radiating out the aortic outflow tract, no diastolic murmurs ABD:  Flat, positive bowel sounds normal in frequency in pitch, no bruits, no rebound, no guarding, no midline pulsatile mass, no hepatomegaly, no splenomegaly EXT:  2 plus pulses throughout, no edema, no cyanosis no clubbing, varicose veins    EKG:  EKG is not ordered today.    Recent Labs: 12/11/2017: ALT 20 02/08/2018: BUN 23; Creatinine, Ser 1.06; Hemoglobin 17.7; Magnesium 2.2; Platelets 123; Potassium 3.9; Sodium 138; TSH 1.081    Lipid Panel    Component Value Date/Time   CHOL 171 06/01/2017 1548   TRIG 123 06/01/2017 1548   HDL 45 06/01/2017 1548   CHOLHDL 3.8 06/01/2017 1548   VLDL 55 (H) 08/26/2013 1234   LDLCALC 104 (H) 06/01/2017 1548      Wt Readings from Last 3 Encounters:  03/22/18 236 lb 3.2 oz (107.1 kg)  02/16/18 238 lb 9.6 oz (108.2 kg)  02/12/18 245 lb (111.1 kg)      Other studies Reviewed: Additional studies/ records that were reviewed today include: Echo Review of the above records demonstrates:  Please see elsewhere in the note.     ASSESSMENT AND PLAN:  SVT: I previously reviewed this with Dr. Rayann Heman.  It does appear to be SVT rather than VT.  And then increase his metoprolol for management of both his cardiomyopathy and his SVT.   AORTIC STENOSIS: He appears to have severe aortic stenosis.  I am to send him for right and left heart  catheterization possibly with dobutamine to further evaluate this.  He came today with his son.  They were emotional as we discussed the possible need for aortic valve replacement in the months to come.   HTN:  This is being managed in the context of treating his CHF  ACUTE SYSTOLIC HF: He needs right and left heart cath.  I am going  to increase his beta-blocker as above.  The next step would be to switch to [facility].  He is advised to completely quit drinking alcohol as this could be related.  He will have an ischemia evaluation with the catheterization as above.  TOBACCO ABUSE: He understands the need to stop smoking and we discussed this.   Current medicines are reviewed at length with the patient today.  The patient does not have concerns regarding medicines.  The following changes have been made:  As above  Labs/ tests ordered today include:   Orders Placed This Encounter  Procedures  . CBC  . Basic Metabolic Panel (BMET)     Disposition:   FU with me after the cath.    Signed, Minus Breeding, MD  03/23/2018 11:37 AM    [facility]

## 2018-03-23 ENCOUNTER — Encounter: Payer: Self-pay | Admitting: Cardiology

## 2018-03-23 ENCOUNTER — Telehealth: Payer: Self-pay | Admitting: Cardiology

## 2018-03-23 NOTE — Telephone Encounter (Signed)
Message routed to Dr.Hochrein's medical assist [place].

## 2018-03-23 NOTE — Telephone Encounter (Signed)
Patient is calling to set up his heart cath.

## 2018-03-26 ENCOUNTER — Ambulatory Visit: Payer: Self-pay | Admitting: Adult Health

## 2018-03-26 NOTE — Telephone Encounter (Signed)
Leave message for pt to call back 

## 2018-03-27 ENCOUNTER — Ambulatory Visit: Payer: Managed Care, Other (non HMO) | Admitting: Cardiology

## 2018-03-27 NOTE — Telephone Encounter (Signed)
Call and leave detailed message about pt cath schedule for tomorrow, pt have been difficult to reach by phone.

## 2018-03-28 ENCOUNTER — Encounter (HOSPITAL_COMMUNITY)
Admission: RE | Disposition: A | Discharge status: Home / Self Care | Payer: Self-pay | Source: Home / Self Care | Attending: Cardiology

## 2018-03-28 ENCOUNTER — Inpatient Hospital Stay (HOSPITAL_COMMUNITY)
Admission: RE | Admit: 2018-03-28 | Discharge: 2018-03-30 | DRG: 287 | Disposition: A | Discharge status: Home / Self Care | Payer: Managed Care, Other (non HMO) | Attending: Cardiology | Admitting: Cardiology

## 2018-03-28 ENCOUNTER — Encounter (HOSPITAL_COMMUNITY): Payer: Self-pay | Admitting: Cardiovascular Disease

## 2018-03-28 ENCOUNTER — Other Ambulatory Visit: Payer: Self-pay

## 2018-03-28 ENCOUNTER — Inpatient Hospital Stay (HOSPITAL_COMMUNITY): Payer: Managed Care, Other (non HMO)

## 2018-03-28 DIAGNOSIS — I5023 Acute on chronic systolic (congestive) heart failure: Secondary | ICD-10-CM | POA: Diagnosis present

## 2018-03-28 DIAGNOSIS — Z833 Family history of diabetes mellitus: Secondary | ICD-10-CM | POA: Diagnosis not present

## 2018-03-28 DIAGNOSIS — I350 Nonrheumatic aortic (valve) stenosis: Secondary | ICD-10-CM

## 2018-03-28 DIAGNOSIS — I471 Supraventricular tachycardia: Secondary | ICD-10-CM | POA: Diagnosis present

## 2018-03-28 DIAGNOSIS — Z820 Family history of epilepsy and other diseases of the nervous system: Secondary | ICD-10-CM | POA: Diagnosis not present

## 2018-03-28 DIAGNOSIS — E559 Vitamin D deficiency, unspecified: Secondary | ICD-10-CM | POA: Diagnosis present

## 2018-03-28 DIAGNOSIS — Z79899 Other long term (current) drug therapy: Secondary | ICD-10-CM | POA: Diagnosis not present

## 2018-03-28 DIAGNOSIS — I110 Hypertensive heart disease with heart failure: Principal | ICD-10-CM | POA: Diagnosis present

## 2018-03-28 DIAGNOSIS — F17210 Nicotine dependence, cigarettes, uncomplicated: Secondary | ICD-10-CM | POA: Diagnosis present

## 2018-03-28 DIAGNOSIS — I493 Ventricular premature depolarization: Secondary | ICD-10-CM | POA: Diagnosis present

## 2018-03-28 DIAGNOSIS — I509 Heart failure, unspecified: Secondary | ICD-10-CM | POA: Diagnosis not present

## 2018-03-28 DIAGNOSIS — I420 Dilated cardiomyopathy: Secondary | ICD-10-CM | POA: Diagnosis present

## 2018-03-28 DIAGNOSIS — F419 Anxiety disorder, unspecified: Secondary | ICD-10-CM | POA: Diagnosis present

## 2018-03-28 DIAGNOSIS — Z8249 Family history of ischemic heart disease and other diseases of the circulatory system: Secondary | ICD-10-CM | POA: Diagnosis not present

## 2018-03-28 DIAGNOSIS — Q231 Congenital insufficiency of aortic valve: Secondary | ICD-10-CM

## 2018-03-28 DIAGNOSIS — I447 Left bundle-branch block, unspecified: Secondary | ICD-10-CM | POA: Diagnosis present

## 2018-03-28 DIAGNOSIS — I2510 Atherosclerotic heart disease of native coronary artery without angina pectoris: Secondary | ICD-10-CM | POA: Diagnosis present

## 2018-03-28 DIAGNOSIS — Z01810 Encounter for preprocedural cardiovascular examination: Secondary | ICD-10-CM | POA: Diagnosis not present

## 2018-03-28 DIAGNOSIS — Z7982 Long term (current) use of aspirin: Secondary | ICD-10-CM

## 2018-03-28 DIAGNOSIS — I2721 Secondary pulmonary arterial hypertension: Secondary | ICD-10-CM | POA: Diagnosis present

## 2018-03-28 HISTORY — DX: Supraventricular tachycardia: I47.1

## 2018-03-28 HISTORY — DX: Nonrheumatic aortic (valve) stenosis: I35.0

## 2018-03-28 HISTORY — DX: Other cardiomyopathies: I42.8

## 2018-03-28 HISTORY — DX: Supraventricular tachycardia, unspecified: I47.10

## 2018-03-28 HISTORY — DX: Atherosclerotic heart disease of native coronary artery without angina pectoris: I25.10

## 2018-03-28 HISTORY — PX: RIGHT HEART CATH AND CORONARY ANGIOGRAPHY: CATH118264

## 2018-03-28 LAB — POCT I-STAT EG7
ACID-BASE DEFICIT: 2 mmol/L (ref 0.0–2.0)
Acid-base deficit: 3 mmol/L — ABNORMAL HIGH (ref 0.0–2.0)
Bicarbonate: 21.6 mmol/L (ref 20.0–28.0)
Bicarbonate: 22.8 mmol/L (ref 20.0–28.0)
CALCIUM ION: 1.18 mmol/L (ref 1.15–1.40)
Calcium, Ion: 1.1 mmol/L — ABNORMAL LOW (ref 1.15–1.40)
HCT: 38 % — ABNORMAL LOW (ref 39.0–52.0)
HCT: 39 % (ref 39.0–52.0)
Hemoglobin: 12.9 g/dL — ABNORMAL LOW (ref 13.0–17.0)
Hemoglobin: 13.3 g/dL (ref 13.0–17.0)
O2 Saturation: 60 %
O2 Saturation: 62 %
PO2 VEN: 32 mmHg (ref 32.0–45.0)
Potassium: 3.6 mmol/L (ref 3.5–5.1)
Potassium: 3.8 mmol/L (ref 3.5–5.1)
Sodium: 140 mmol/L (ref 135–145)
Sodium: 141 mmol/L (ref 135–145)
TCO2: 23 mmol/L (ref 22–32)
TCO2: 24 mmol/L (ref 22–32)
pCO2, Ven: 36.4 mmHg — ABNORMAL LOW (ref 44.0–60.0)
pCO2, Ven: 37.3 mmHg — ABNORMAL LOW (ref 44.0–60.0)
pH, Ven: 7.382 (ref 7.250–7.430)
pH, Ven: 7.394 (ref 7.250–7.430)
pO2, Ven: 32 mmHg (ref 32.0–45.0)

## 2018-03-28 LAB — CREATININE, SERUM
Creatinine, Ser: 0.95 mg/dL (ref 0.61–1.24)
GFR calc Af Amer: >60 mL/min (ref >60)
GFR calc non Af Amer: >60 mL/min (ref >60)

## 2018-03-28 LAB — CBC WITH DIFFERENTIAL/PLATELET
Abs Immature Granulocytes: 0.02 10*3/uL (ref 0.00–0.07)
Basophils Absolute: 0 10*3/uL (ref 0.0–0.1)
Basophils Relative: 1 %
Eosinophils Absolute: 0.1 10*3/uL (ref 0.0–0.5)
Eosinophils Relative: 2 %
HCT: 45.3 % (ref 39.0–52.0)
Hemoglobin: 14.9 g/dL (ref 13.0–17.0)
Immature Granulocytes: 0 %
Lymphocytes Relative: 19 %
Lymphs Abs: 1 10*3/uL (ref 0.7–4.0)
MCH: 29.2 pg (ref 26.0–34.0)
MCHC: 32.9 g/dL (ref 30.0–36.0)
MCV: 88.8 fL (ref 80.0–100.0)
MONO ABS: 0.6 10*3/uL (ref 0.1–1.0)
Monocytes Relative: 10 %
NEUTROS ABS: 3.6 10*3/uL (ref 1.7–7.7)
Neutrophils Relative %: 68 %
Platelets: UNDETERMINED 10*3/uL (ref 150–400)
RBC: 5.1 MIL/uL (ref 4.22–5.81)
RDW: 13.8 % (ref 11.5–15.5)
WBC: 5.4 10*3/uL (ref 4.0–10.5)
nRBC: 0 % (ref 0.0–0.2)

## 2018-03-28 LAB — ECHOCARDIOGRAM LIMITED
Height: 75 in
WEIGHTICAEL: 3776 [oz_av]

## 2018-03-28 LAB — CBC
HEMATOCRIT: 45.8 % (ref 37.5–51.0)
HEMOGLOBIN: 15.8 g/dL (ref 13.0–17.7)
MCH: 30.2 pg (ref 26.6–33.0)
MCHC: 34.5 g/dL (ref 31.5–35.7)
MCV: 88 fL (ref 79–97)
Platelets: 123 10*3/uL — ABNORMAL LOW (ref 150–450)
RBC: 5.23 x10E6/uL (ref 4.14–5.80)
RDW: 13.2 % (ref 11.6–15.4)
WBC: 6 10*3/uL (ref 3.4–10.8)

## 2018-03-28 LAB — LIPID PANEL
Cholesterol: 140 mg/dL (ref 0–200)
HDL: 21 mg/dL — ABNORMAL LOW (ref >40)
LDL Cholesterol: 104 mg/dL — ABNORMAL HIGH (ref 0–99)
Total CHOL/HDL Ratio: 6.7 RATIO
Triglycerides: 75 mg/dL (ref <150)
VLDL: 15 mg/dL (ref 0–40)

## 2018-03-28 LAB — BASIC METABOLIC PANEL
BUN/Creatinine Ratio: 20 (ref 9–20)
BUN: 20 mg/dL (ref 6–24)
CO2: 21 mmol/L (ref 20–29)
Calcium: 9.2 mg/dL (ref 8.7–10.2)
Chloride: 102 mmol/L (ref 96–106)
Creatinine, Ser: 1.02 mg/dL (ref 0.76–1.27)
GFR calc Af Amer: 93 mL/min/{1.73_m2} (ref >59)
GFR, EST NON AFRICAN AMERICAN: 81 mL/min/{1.73_m2} (ref >59)
Glucose: 100 mg/dL — ABNORMAL HIGH (ref 65–99)
Potassium: 4.3 mmol/L (ref 3.5–5.2)
Sodium: 140 mmol/L (ref 134–144)

## 2018-03-28 LAB — COMPREHENSIVE METABOLIC PANEL
ALT: 16 U/L (ref 0–44)
AST: 17 U/L (ref 15–41)
Albumin: 3.8 g/dL (ref 3.5–5.0)
Alkaline Phosphatase: 52 U/L (ref 38–126)
Anion gap: 11 (ref 5–15)
BILIRUBIN TOTAL: 1.5 mg/dL — AB (ref 0.3–1.2)
BUN: 16 mg/dL (ref 6–20)
CO2: 21 mmol/L — ABNORMAL LOW (ref 22–32)
Calcium: 8.8 mg/dL — ABNORMAL LOW (ref 8.9–10.3)
Chloride: 104 mmol/L (ref 98–111)
Creatinine, Ser: 0.94 mg/dL (ref 0.61–1.24)
GFR calc Af Amer: >60 mL/min (ref >60)
GFR calc non Af Amer: >60 mL/min (ref >60)
Glucose, Bld: 107 mg/dL — ABNORMAL HIGH (ref 70–99)
POTASSIUM: 3.5 mmol/L (ref 3.5–5.1)
Sodium: 136 mmol/L (ref 135–145)
Total Protein: 6.7 g/dL (ref 6.5–8.1)

## 2018-03-28 LAB — BRAIN NATRIURETIC PEPTIDE: B Natriuretic Peptide: 1155.1 pg/mL — ABNORMAL HIGH (ref 0.0–100.0)

## 2018-03-28 LAB — TSH: TSH: 1.643 u[IU]/mL (ref 0.350–4.500)

## 2018-03-28 SURGERY — RIGHT HEART CATH AND CORONARY ANGIOGRAPHY
Anesthesia: LOCAL

## 2018-03-28 MED ORDER — SODIUM CHLORIDE 0.9% FLUSH: 3.0000 mL | Freq: Two times a day (BID) | INTRAVENOUS | Status: DC

## 2018-03-28 MED ORDER — HEPARIN (PORCINE) IN NACL 1000-0.9 UT/500ML-% IV SOLN
INTRAVENOUS | Status: DC | PRN
  Administered 2018-03-28 (×3): 500 mL

## 2018-03-28 MED ORDER — HEPARIN SODIUM (PORCINE) 1000 UNIT/ML IJ SOLN
INTRAMUSCULAR | Status: DC | PRN
  Administered 2018-03-28: 5000 [IU] via INTRAVENOUS

## 2018-03-28 MED ORDER — SODIUM CHLORIDE 0.9% FLUSH
3.0000 mL | Freq: Two times a day (BID) | INTRAVENOUS | Status: DC
  Administered 2018-03-29 – 2018-03-30 (×2): 3 mL via INTRAVENOUS

## 2018-03-28 MED ORDER — FUROSEMIDE 10 MG/ML IJ SOLN
80.0000 mg | Freq: Two times a day (BID) | INTRAMUSCULAR | Status: DC
  Administered 2018-03-28 – 2018-03-29 (×3): 80 mg via INTRAVENOUS
  Filled 2018-03-28 (×3): qty 8

## 2018-03-28 MED ORDER — SODIUM CHLORIDE 0.9 % IV SOLN: 250.0000 mL | INTRAVENOUS | Status: DC | PRN

## 2018-03-28 MED ORDER — ONDANSETRON HCL 4 MG/2ML IJ SOLN: 4.0000 mg | Freq: Four times a day (QID) | INTRAMUSCULAR | Status: DC | PRN

## 2018-03-28 MED ORDER — HEPARIN SODIUM (PORCINE) 5000 UNIT/ML IJ SOLN: 5000.0000 [IU] | Freq: Three times a day (TID) | INTRAMUSCULAR | Status: DC

## 2018-03-28 MED ORDER — ACETAMINOPHEN 325 MG PO TABS: 650.0000 mg | ORAL_TABLET | ORAL | Status: DC | PRN

## 2018-03-28 MED ORDER — MIDAZOLAM HCL 2 MG/2ML IJ SOLN
INTRAMUSCULAR | Status: AC
  Filled 2018-03-28: qty 2

## 2018-03-28 MED ORDER — SODIUM CHLORIDE 0.9% FLUSH: 3.0000 mL | INTRAVENOUS | Status: DC | PRN

## 2018-03-28 MED ORDER — HEPARIN (PORCINE) IN NACL 1000-0.9 UT/500ML-% IV SOLN
INTRAVENOUS | Status: AC
  Filled 2018-03-28: qty 1000

## 2018-03-28 MED ORDER — METOPROLOL SUCCINATE ER 50 MG PO TB24: 50.0000 mg | ORAL_TABLET | ORAL | Status: DC

## 2018-03-28 MED ORDER — VERAPAMIL HCL 2.5 MG/ML IV SOLN
INTRAVENOUS | Status: DC | PRN
  Administered 2018-03-28: 10 mL via INTRA_ARTERIAL

## 2018-03-28 MED ORDER — ASPIRIN 81 MG PO CHEW: 81.0000 mg | CHEWABLE_TABLET | ORAL | Status: AC

## 2018-03-28 MED ORDER — METOPROLOL SUCCINATE ER 50 MG PO TB24
50.0000 mg | ORAL_TABLET | Freq: Every day | ORAL | Status: DC
  Administered 2018-03-28: 50 mg via ORAL
  Filled 2018-03-28 (×2): qty 1

## 2018-03-28 MED ORDER — ALPRAZOLAM 0.25 MG PO TABS
0.2500 mg | ORAL_TABLET | Freq: Every day | ORAL | Status: DC
  Administered 2018-03-28 – 2018-03-29 (×2): 0.25 mg via ORAL
  Filled 2018-03-28 (×2): qty 1

## 2018-03-28 MED ORDER — HEPARIN SODIUM (PORCINE) 1000 UNIT/ML IJ SOLN
INTRAMUSCULAR | Status: AC
  Filled 2018-03-28: qty 1

## 2018-03-28 MED ORDER — HEPARIN (PORCINE) IN NACL 1000-0.9 UT/500ML-% IV SOLN
INTRAVENOUS | Status: AC
  Filled 2018-03-28: qty 500

## 2018-03-28 MED ORDER — LIDOCAINE HCL (PF) 1 % IJ SOLN
INTRAMUSCULAR | Status: AC
  Filled 2018-03-28: qty 30

## 2018-03-28 MED ORDER — BUSPIRONE HCL 10 MG PO TABS
10.0000 mg | ORAL_TABLET | Freq: Every day | ORAL | Status: DC
  Administered 2018-03-28 – 2018-03-29 (×2): 10 mg via ORAL
  Filled 2018-03-28 (×2): qty 1
  Filled 2018-03-28 (×2): qty 2

## 2018-03-28 MED ORDER — SODIUM CHLORIDE 0.9 % IV SOLN
INTRAVENOUS | Status: DC
  Administered 2018-03-28: 08:00:00 via INTRAVENOUS

## 2018-03-28 MED ORDER — MIDAZOLAM HCL 2 MG/2ML IJ SOLN
INTRAMUSCULAR | Status: DC | PRN
  Administered 2018-03-28 (×2): 0.5 mg via INTRAVENOUS
  Administered 2018-03-28: 1 mg via INTRAVENOUS

## 2018-03-28 MED ORDER — IOHEXOL 350 MG/ML SOLN
INTRAVENOUS | Status: DC | PRN
  Administered 2018-03-28: 135 mL via INTRA_ARTERIAL

## 2018-03-28 MED ORDER — FUROSEMIDE 10 MG/ML IJ SOLN
INTRAMUSCULAR | Status: AC
  Filled 2018-03-28: qty 8

## 2018-03-28 MED ORDER — SACUBITRIL-VALSARTAN 24-26 MG PO TABS
1.0000 | ORAL_TABLET | Freq: Two times a day (BID) | ORAL | Status: DC
  Administered 2018-03-30: 1 via ORAL
  Filled 2018-03-28 (×2): qty 1

## 2018-03-28 MED ORDER — SPIRONOLACTONE 12.5 MG HALF TABLET
12.5000 mg | ORAL_TABLET | Freq: Every day | ORAL | Status: DC
  Administered 2018-03-28 – 2018-03-30 (×3): 12.5 mg via ORAL
  Filled 2018-03-28 (×3): qty 1

## 2018-03-28 MED ORDER — FENTANYL CITRATE (PF) 100 MCG/2ML IJ SOLN
INTRAMUSCULAR | Status: AC
  Filled 2018-03-28: qty 2

## 2018-03-28 MED ORDER — METOPROLOL SUCCINATE ER 100 MG PO TB24
100.0000 mg | ORAL_TABLET | Freq: Every day | ORAL | Status: DC
  Administered 2018-03-29: 100 mg via ORAL
  Filled 2018-03-28: qty 1

## 2018-03-28 MED ORDER — SERTRALINE HCL 100 MG PO TABS
100.0000 mg | ORAL_TABLET | Freq: Every day | ORAL | Status: DC
  Administered 2018-03-28 – 2018-03-29 (×2): 100 mg via ORAL
  Filled 2018-03-28 (×2): qty 1

## 2018-03-28 MED ORDER — ACETAMINOPHEN 500 MG PO TABS: 1000.0000 mg | ORAL_TABLET | Freq: Four times a day (QID) | ORAL | Status: DC | PRN

## 2018-03-28 MED ORDER — ASPIRIN 81 MG PO CHEW
81.0000 mg | CHEWABLE_TABLET | Freq: Every day | ORAL | Status: DC
  Administered 2018-03-29 – 2018-03-30 (×2): 81 mg via ORAL
  Filled 2018-03-28 (×2): qty 1

## 2018-03-28 MED ORDER — POTASSIUM CHLORIDE CRYS ER 20 MEQ PO TBCR
40.0000 meq | EXTENDED_RELEASE_TABLET | Freq: Every day | ORAL | Status: DC
  Administered 2018-03-28 – 2018-03-30 (×3): 40 meq via ORAL
  Filled 2018-03-28 (×3): qty 2

## 2018-03-28 MED ORDER — HEPARIN SODIUM (PORCINE) 5000 UNIT/ML IJ SOLN
5000.0000 [IU] | Freq: Three times a day (TID) | INTRAMUSCULAR | Status: DC
  Administered 2018-03-28 – 2018-03-30 (×5): 5000 [IU] via SUBCUTANEOUS
  Filled 2018-03-28 (×5): qty 1

## 2018-03-28 MED ORDER — DIGOXIN 125 MCG PO TABS
0.1250 mg | ORAL_TABLET | Freq: Every day | ORAL | Status: DC
  Administered 2018-03-28 – 2018-03-30 (×3): 0.125 mg via ORAL
  Filled 2018-03-28 (×3): qty 1

## 2018-03-28 MED ORDER — ROSUVASTATIN CALCIUM 5 MG PO TABS
10.0000 mg | ORAL_TABLET | Freq: Every day | ORAL | Status: DC
  Administered 2018-03-28 – 2018-03-29 (×2): 10 mg via ORAL
  Filled 2018-03-28 (×2): qty 2

## 2018-03-28 MED ORDER — VERAPAMIL HCL 2.5 MG/ML IV SOLN
INTRAVENOUS | Status: AC
  Filled 2018-03-28: qty 2

## 2018-03-28 MED ORDER — LIDOCAINE HCL (PF) 1 % IJ SOLN
INTRAMUSCULAR | Status: DC | PRN
  Administered 2018-03-28 (×2): 2 mL via SUBCUTANEOUS

## 2018-03-28 MED ORDER — FENTANYL CITRATE (PF) 100 MCG/2ML IJ SOLN
INTRAMUSCULAR | Status: DC | PRN
  Administered 2018-03-28 (×2): 12.5 ug via INTRAVENOUS
  Administered 2018-03-28: 25 ug via INTRAVENOUS

## 2018-03-28 SURGICAL SUPPLY — 15 items
CATH 5FR JL3.5 JR4 ANG PIG MP (CATHETERS) ×1 IMPLANT
CATH BALLN WEDGE 5F 110CM (CATHETERS) ×1 IMPLANT
CATH INFINITI 5 FR 3DRC (CATHETERS) ×1 IMPLANT
CATH INFINITI 5 FR AR1 MOD (CATHETERS) ×1 IMPLANT
DEVICE RAD COMP TR BAND LRG (VASCULAR PRODUCTS) ×1 IMPLANT
GLIDESHEATH SLEND SS 6F .021 (SHEATH) ×1 IMPLANT
GUIDEWIRE INQWIRE 1.5J.035X260 (WIRE) IMPLANT
INQWIRE 1.5J .035X260CM (WIRE) ×2
KIT HEART LEFT (KITS) ×2 IMPLANT
PACK CARDIAC CATHETERIZATION (CUSTOM PROCEDURE TRAY) ×2 IMPLANT
SHEATH GLIDE SLENDER 4/5FR (SHEATH) ×1 IMPLANT
SHEATH PROBE COVER 6X72 (BAG) ×1 IMPLANT
TRANSDUCER W/STOPCOCK (MISCELLANEOUS) ×2 IMPLANT
TUBING CIL FLEX 10 FLL-RA (TUBING) ×2 IMPLANT
WIRE HI TORQ VERSACORE-J 145CM (WIRE) ×1 IMPLANT

## 2018-03-28 NOTE — Consult Note (Signed)
Valve Team Consultation:   Patient ID: Aarik Blank MRN: [id number]; DOB: 07/13/1960  Admit date: 03/28/2018 Date of Consult: 03/28/2018  Primary Care Provider: Unk Pinto, MD Primary Cardiologist: No primary care provider on file. [place]  Patient Profile:   Tyler Berger is a 58 y.o. male with a history of non-ischemic cardiomyopathy, chronic systolic CHF, SVT, tobacco abuse, non-obstructive CAD, hypertension, anxiety and aortic stenosis admitted with volume overload following outpatient diagnostic cardiac catheterization who is being seen today for the evaluation of aortic stenosis at the request of Dr. Aundra Dubin.  History of Present Illness:   Tyler Berger is a 58 yo male with a history of non-ischemic cardiomyopathy, chronic systolic CHF, SVT, tobacco abuse, non-obstructive CAD, hypertension, anxiety and aortic stenosis admitted with volume overload following outpatient diagnostic cardiac catheterization who is being seen today for the evaluation of aortic stenosis at the request of Dr. Aundra Dubin. He is followed in our office by Dr. Percival Spanish. In 2004 he was found to have LV systolic dysfunction with LVEF=20%. This was felt to be a non-ischemic cardiomyopathy. Follow up echocardiogram several years later with report of normal LV systolic function. I do not have this echocardiogram. Most recent echo in January 2020 with LVEF=25-30% with diffuse hypokinesis of the left ventricle. There was severe aortic valve stenosis with mean gradient of 33 mmHg, peak gradient 52 mmHg, AVA 0.68cm2. When he was most recently seen in our office by Dr. Percival Spanish on 03/22/18, he reported chronic dyspnea with no acute worsening. No chest pain or dizziness. Rare palpitations. He does have a history of SVT. Cardiac cath today per Dr. Aundra Dubin and found to have non-obstructive CAD with severely elevated right heart pressures. The aortic valve was not crossed. Limited echo today with severe aortic stenosis with mean gradient of  35 mmHg.   He tells me that he has noticed exercise intolerance over the past year. He has fatigue with minimal exertion. One syncopal episode at work in August 2019. Rare dizziness now. He has mild chest pressure with exertion. He has no lower extremity edema. He smokes 1/2 ppd. He has two glasses of wine per day.   Past Medical History:  Diagnosis Date  . Anxiety   . Aortic stenosis   . CAD (coronary artery disease)   . Hypertension   . Nonischemic cardiomyopathy ([place])   . SVT (supraventricular tachycardia) ([place])   . Viral myocarditis 2004  . Vitamin D deficiency     Past Surgical History:  Procedure Laterality Date  . HERNIA REPAIR Left 2010  . ORCHIECTOMY Left 1982  . VARICOSE VEIN SURGERY Left 05/1998     Home Medications:  Prior to Admission medications   Medication Sig Start Date End Date Taking? Authorizing Provider  acetaminophen (TYLENOL) 500 MG tablet Take 1,000 mg by mouth every 6 (six) hours as needed for mild pain or headache.   Yes [provider]  ALPRAZolam Duanne Moron) 0.5 MG tablet Take 1 tablet (0.5 mg total) by mouth at bedtime as needed for anxiety. Patient taking differently: Take 0.25 mg by mouth at bedtime.  02/12/18  Yes Liane Comber, NP  aspirin 81 MG chewable tablet Chew 81 mg by mouth daily.    Yes [provider]  busPIRone (BUSPAR) 10 MG tablet Take 1 tablet (10 mg total) by mouth 3 (three) times daily. Patient taking differently: Take 10 mg by mouth at bedtime.  02/12/18  Yes Liane Comber, NP  Cholecalciferol (VITAMIN D PO) Take 4 capsules by mouth daily.  Yes [provider]  furosemide (LASIX) 80 MG tablet TAKE ONE-HALF TO ONE TABLET BY MOUTH ONCE DAILY FOR BLOOD PRESSURE AND  FLUID Patient taking differently: Take 80 mg by mouth daily.  02/12/18  Yes Liane Comber, NP  lisinopril (PRINIVIL,ZESTRIL) 20 MG tablet Take 1 tablet (20 mg total) by mouth 2 (two) times daily. 06/01/17  Yes Vicie Mutters, PA-C  metoprolol  succinate (TOPROL-XL) 100 MG 24 hr tablet Take 1 tablet (100 mg total) by mouth 2 (two) times daily. Take with or immediately following a meal. Patient taking differently: Take 50-100 mg by mouth See admin instructions. Take 100mg  in the AM and 50mg  in the PM.  Take with or immediately following a meal. 03/22/18 06/20/18 Yes Hochrein, Jeneen Rinks, MD  sertraline (ZOLOFT) 100 MG tablet Take 1 tablet (100 mg total) by mouth daily. Patient taking differently: Take 100 mg by mouth at bedtime.  02/12/18  Yes Liane Comber, NP  fluticasone furoate-vilanterol (BREO ELLIPTA) 100-25 MCG/INH AEPB Inhale 1 puff into the lungs daily. Rinse mouth with water after each use Patient not taking: Reported on 03/26/2018 11/30/17   Vicie Mutters, PA-C    Inpatient Medications: Scheduled Meds: . ALPRAZolam  0.25 mg Oral QHS  . aspirin  81 mg Oral Pre-Cath  . aspirin  81 mg Oral Daily  . busPIRone  10 mg Oral QHS  . digoxin  0.125 mg Oral Daily  . furosemide  80 mg Intravenous BID  . heparin  5,000 Units Subcutaneous Q8H  . metoprolol succinate  50-100 mg Oral See admin instructions  . potassium chloride  40 mEq Oral Daily  . rosuvastatin  10 mg Oral q1800  . [START ON 03/29/2018] sacubitril-valsartan  1 tablet Oral BID  . sertraline  100 mg Oral QHS  . sodium chloride flush  3 mL Intravenous Q12H  . sodium chloride flush  3 mL Intravenous Q12H  . spironolactone  12.5 mg Oral Daily   Continuous Infusions: . sodium chloride    . sodium chloride 10 mL/hr at 03/28/18 0739  . sodium chloride    . sodium chloride     PRN Meds: sodium chloride, sodium chloride, sodium chloride, acetaminophen, acetaminophen, ondansetron (ZOFRAN) IV, sodium chloride flush, sodium chloride flush  Allergies:   No Known Allergies  Social History:   Social History   Socioeconomic History  . Marital status: Married    Spouse name: Not on file  . Number of children: 2  . Years of education: Not on file  . Highest education level:  Not on file  Occupational History  . 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 Financial resource strain: Not on file  . Food insecurity:    Worry: Not on file    Inability: Not on file  . Transportation needs:    Medical: Not on file    Non-medical: Not on file  Tobacco Use  . Smoking status: Current Every Day Smoker    Packs/day: 0.75    Years: 30.00    Pack years: 22.50    Types: Cigarettes  . Smokeless tobacco: Never Used  Substance and Sexual Activity  . Alcohol use: Yes    Alcohol/week: 2.0 standard drinks    Types: 2 Glasses of wine per week  . Drug use: Never  . Sexual activity: Not on file  Lifestyle  . Physical activity:    Days per week: Not on file    Minutes per session: Not on file  . Stress: Not on  file  Relationships  . Social connections:    Talks on phone: Not on file    Gets together: Not on file    Attends religious service: Not on file    Active member of club or organization: Not on file    Attends meetings of clubs or organizations: Not on file    Relationship status: Not on file  . Intimate partner violence:    Fear of current or ex partner: Not on file    Emotionally abused: Not on file    Physically abused: Not on file    Forced sexual activity: Not on file  Other Topics Concern  . Not on file  Social History Narrative   Married, med Designer, multimedia.  Two children.      Family History:    Family History  Problem Relation Age of Onset  . Diabetes Mother   . Hypertension Mother   . Cancer Mother        breast  . Alzheimer's disease Mother   . Hypertension Father   . Cancer Father        head and neck  . Hypertension Sister      ROS:  Please see the history of present illness.  All other ROS reviewed and negative.     Physical Exam/Data:   Vitals:   03/28/18 1008 03/28/18 1151 03/28/18 1220 03/28/18 1222  BP: 97/82 105/76 (!) 98/50 (!) 98/50  Pulse:   63 78  Resp:   (!) 29 (!) 23  Temp:      SpO2:   97% 98%    Weight:      Height:       No intake or output data in the 24 hours ending 03/28/18 1330 Last 3 Weights 03/28/2018 03/22/2018 02/16/2018  Weight (lbs) 236 lb 236 lb 3.2 oz 238 lb 9.6 oz  Weight (kg) 107.049 kg 107.14 kg 108.228 kg     Body mass index is 29.5 kg/m.  General:  Well nourished, well developed, in no acute distress HEENT: normal Lymph: no adenopathy Neck: no JVD Endocrine:  No thryomegaly Vascular: No carotid bruits; FA pulses 2+ bilaterally without bruits  Cardiac:  RRR. Loud, harsh late peaking systolic murmur.  Lungs:  clear to auscultation bilaterally, no wheezing, rhonchi or rales  Abd: soft, nontender, no hepatomegaly  Ext: no edema Musculoskeletal:  No deformities, BUE and BLE strength normal and equal Skin: warm and dry  Neuro:  CNs 2-12 intact, no focal abnormalities noted Psych:  Normal affect   EKG:  The EKG was personally reviewed and demonstrates:  Sinus, LBBB Telemetry:  Telemetry was personally reviewed and demonstrates:  sinus  Relevant CV Studies:  Limited Echo 03/28/18:   1. The left ventricle has severely reduced systolic function, with an ejection fraction of 20-25%. The cavity size was mildly dilated. There is mildly increased left ventricular wall thickness. Diastolic function not assessed, limited echo. Left  ventricular diffuse hypokinesis.  2. Mild calcification of the mitral valve leaflet. No evidence of mitral valve stenosis. Mild mitral regurgitation.  3. Bicuspid aortic valve by prior report, but unable to tell leaflet number on this study. There is Severe calcifcation of the aortic valve with some mobility to the calcification. Aortic valve regurgitation is trivial by color flow Doppler. Low  gradient, severe aortic stenosis with mean gradient 35 mmHg and AVA 0.82 cm^2.  4. There is mild dilatation of the aortic root measuring 42 mm.  5. The inferior vena cava was dilated in  size with >50% respiratory variability.  6. The tricuspid valve  was normal in structure.  7. Limited echo to assess aortic valve.  FINDINGS  Left Ventricle: The left ventricle has severely reduced systolic function, with an ejection fraction of 20-25%. The cavity size was mildly dilated. There is mildly increased left ventricular wall thickness. Diastolic function not assessed, limited echo.  Left ventricular diffuse hypokinesis. Right Ventricle: The right ventricle has mildly reduced systolic function. The cavity was normal. There is no increase in right ventricular wall thickness.    Pericardium: There is no evidence of pericardial effusion. Mitral Valve: Mild calcification of the mitral valve leaflet. Mitral valve regurgitation is mild by color flow Doppler. No evidence of mitral valve stenosis. Tricuspid Valve: The tricuspid valve was normal in structure. Tricuspid valve regurgitation was not visualized by color flow Doppler. Aortic Valve: There is Severe calcifcation of the aortic valve Aortic valve regurgitation is trivial by color flow Doppler. Aorta: There is mild dilatation of the aortic root measuring 42 mm. Venous: The inferior vena cava is dilated in size with greater than 50% respiratory variability.   LEFT VENTRICLE PLAX 2D (Teich) LV EF:          7.6 % LVIDd:          5.90 cm LVIDs:          5.70 cm LV PW:          1.60 cm LV IVS:         1.20 cm LVOT diam:      2.40 cm LV SV:          13 ml LVOT Area:      4.52 cm  LEFT ATRIUM         Index      RIGHT ATRIUM LA diam:    4.40 cm 1.87 cm/m RA Pressure: 8 mmHg  AORTIC VALVE AV Area (Vmax):    1.47 cm AV Area (Vmean):   1.35 cm AV Area (VTI):     1.35 cm AV Vmax:           345.20 cm/s AV Vmean:          256.000 cm/s AV VTI:            0.820 m AV Peak Grad:      47.7 mmHg AV Mean Grad:      29.6 mmHg LVOT Vmax:         111.90 cm/s LVOT Vmean:        76.133 cm/s LVOT VTI:          0.245 m LVOT/AV VTI ratio: 0.30   AORTA Ao Root diam: 4.00 cm Ao Asc diam:  4.20  cm  Echo 03/02/18: Study Conclusions  - Procedure narrative: Transthoracic echocardiography. Image   quality was adequate. The study was technically difficult. - Left ventricle: The cavity size was moderately dilated. Wall   thickness was increased in a pattern of mild LVH. Systolic   function was severely reduced. The estimated ejection fraction   was in the range of 25% to 30%. Diffuse hypokinesis. Left   ventricular diastolic function parameters were normal. - Aortic valve: Poorly visualized previously described as bicuspid   Severe calcification and severe AS. Valve area (VTI): 0.77 cm^2.   Valve area (Vmax): 0.7 cm^2. Valve area (Vmean): 0.68 cm^2. - Left atrium: The atrium was moderately dilated. - Atrial septum: No defect or patent foramen ovale was identified.  ------------------------------------------------------------------- Study data:  Comparison was  made to the study of 08/02/2006.  Study status:  Routine.  Procedure:  The patient reported no pain pre or post test. Transthoracic echocardiography. Image quality was adequate. The study was technically difficult.  Study completion: There were no complications.          Transthoracic echocardiography.  M-mode, complete 2D, spectral Doppler, and color Doppler.  Birthdate:  Patient birthdate: 05/05/1960.  Age:  Patient is 58 yr old.  Sex:  Gender: male.    BMI: 29.8 kg/m^2.  Blood pressure:     106/72  Patient status:  Outpatient.  Study date: Study date: 03/02/2018. Study time: 09:38 AM.  Location:  [facility]  -------------------------------------------------------------------  ------------------------------------------------------------------- Left ventricle:  The cavity size was moderately dilated. Wall thickness was increased in a pattern of mild LVH. Systolic function was severely reduced. The estimated ejection fraction was in the range of 25% to 30%. Diffuse hypokinesis. The transmitral flow pattern was  normal. The deceleration time of the early transmitral flow velocity was normal. The pulmonary vein flow pattern was normal. The tissue Doppler parameters were normal. Left ventricular diastolic function parameters were normal.  ------------------------------------------------------------------- Aortic valve:  Poorly visualized previously described as bicuspid Severe calcification and severe AS.  Doppler:     VTI ratio of LVOT to aortic valve: 0.22. Valve area (VTI): 0.77 cm^2. Indexed valve area (VTI): 0.32 cm^2/m^2. Peak velocity ratio of LVOT to aortic valve: 0.2. Valve area (Vmax): 0.7 cm^2. Indexed valve area (Vmax): 0.29 cm^2/m^2. Mean velocity ratio of LVOT to aortic valve: 0.2. Valve area (Vmean): 0.68 cm^2. Indexed valve area (Vmean): 0.28 cm^2/m^2.    Mean gradient (S): 33 mm Hg. Peak gradient (S): 52 mm Hg.  ------------------------------------------------------------------- Aorta:  The aorta was normal, not dilated, and non-diseased.  ------------------------------------------------------------------- Mitral valve:   Mildly thickened leaflets .  Doppler:  There was trivial regurgitation.  ------------------------------------------------------------------- Left atrium:  The atrium was moderately dilated.  ------------------------------------------------------------------- Atrial septum:  No defect or patent foramen ovale was identified.   ------------------------------------------------------------------- Right ventricle:  The cavity size was normal. Wall thickness was normal. Systolic function was normal.  ------------------------------------------------------------------- Pulmonic valve:    Doppler:  There was mild regurgitation.  ------------------------------------------------------------------- Tricuspid valve:   Doppler:  There was mild regurgitation.  ------------------------------------------------------------------- Right atrium:  The atrium was  normal in size.  ------------------------------------------------------------------- Pericardium:  The pericardium was normal in appearance.  ------------------------------------------------------------------- Systemic veins: Inferior vena cava: The vessel was normal in size. The respirophasic diameter changes were in the normal range (>= 50%), consistent with normal central venous pressure.  ------------------------------------------------------------------- Post procedure conclusions Ascending Aorta:  - The aorta was normal, not dilated, and non-diseased.  ------------------------------------------------------------------- Measurements   Left ventricle                           Value          Reference  LV ID, ED, PLAX chordal           (H)    57.1  mm       43 - 52  LV ID, ES, PLAX chordal           (H)    46.8  mm       23 - 38  LV fx shortening, PLAX chordal    (L)    18    %        >=29  LV PW thickness, ED  12.7  mm       ----------  IVS/LV PW ratio, ED                      1.01           <=1.3  Stroke volume, 2D                        62    ml       ----------  Stroke volume/bsa, 2D                    26    ml/m^2   ----------  LV e&', medial                            5.99  cm/s     ----------  Longitudinal strain, TDI                 9     %        ----------    Ventricular septum                       Value          Reference  IVS thickness, ED                        12.8  mm       ----------    LVOT                                     Value          Reference  LVOT ID, S                               21    mm       ----------  LVOT area                                3.46  cm^2     ----------  LVOT peak velocity, S                    73.7  cm/s     ----------  LVOT mean velocity, S                    52    cm/s     ----------  LVOT VTI, S                              17.8  cm       ----------    Aortic valve                              Value          Reference  Aortic valve peak velocity, S            362   cm/s     ----------  Aortic valve mean velocity, S  263   cm/s     ----------  Aortic valve VTI, S                      79.5  cm       ----------  Aortic mean gradient, S                  33    mm Hg    ----------  Aortic peak gradient, S                  52    mm Hg    ----------  VTI ratio, LVOT/AV                       0.22           ----------  Aortic valve area, VTI                   0.77  cm^2     ----------  Aortic valve area/bsa, VTI               0.32  cm^2/m^2 ----------  Velocity ratio, peak, LVOT/AV            0.2            ----------  Aortic valve area, peak velocity         0.7   cm^2     ----------  Aortic valve area/bsa, peak              0.29  cm^2/m^2 ----------  velocity  Velocity ratio, mean, LVOT/AV            0.2            ----------  Aortic valve area, mean velocity         0.68  cm^2     ----------  Aortic valve area/bsa, mean              0.28  cm^2/m^2 ----------  velocity  Aortic regurg peak velocity              309   cm/s     ----------  Aortic regurg pressure half-time         193   ms       ----------  Aortic regurg peak gradient              38    mm Hg    ----------    Aorta                                    Value          Reference  Aortic root ID, ED                       37    mm       ----------    Left atrium                              Value          Reference  LA ID, A-P, ES                           51  mm       ----------  LA ID/bsa, A-P                           2.11  cm/m^2   <=2.2  LA volume, S                             66.4  ml       ----------  LA volume/bsa, S                         27.5  ml/m^2   ----------  LA volume, ES, 1-p A4C                   64.6  ml       ----------  LA volume/bsa, ES, 1-p A4C               26.8  ml/m^2   ----------  LA volume, ES, 1-p A2C                   64.7  ml       ----------  LA volume/bsa, ES, 1-p A2C                26.8  ml/m^2   ----------    Right atrium                             Value          Reference  RA ID, S-I, ES, A4C                      46.7  mm       34 - 49  RA area, ES, A4C                         16.9  cm^2     8.3 - 19.5  RA volume, ES, A/L                       48.7  ml       ----------  RA volume/bsa, ES, A/L                   20.2  ml/m^2   ----------    Right ventricle                          Value          Reference  RV s&', lateral, S                        11    cm/s     ----------  Cardiac cath 03/28/18: Left Main  No significant disease.  Left Anterior Descending  Small D1 with 80% proximal stenosis. Moderate D2 with 50% proximal stenosis. Luminal irregularities in the LAD.  Left Circumflex  50% mid LCx stenosis.  Right Coronary Artery  50% distal RCA stenosis.     1. Markedly elevated PCWP.  2. Decreased cardiac output.  3. 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  4. Nonobstructive CAD.   Laboratory Data:  Chemistry Recent Labs  Lab 03/27/18 1527 03/28/18 0904 03/28/18 0905  NA  140 140 141  K 4.3 3.8 3.6  CL 102  --   --   CO2 21  --   --   GLUCOSE 100*  --   --   BUN 20  --   --   CREATININE 1.02  --   --   CALCIUM 9.2  --   --   GFRNONAA 81  --   --   GFRAA 93  --   --     No results for input(s): PROT, ALBUMIN, AST, ALT, ALKPHOS, BILITOT in the last 168 hours. Hematology Recent Labs  Lab 03/27/18 1527 03/28/18 0904 03/28/18 0905  WBC 6.0  --   --   RBC 5.23  --   --   HGB 15.8 13.3 12.9*  HCT 45.8 39.0 38.0*  MCV 88  --   --   MCH 30.2  --   --   MCHC 34.5  --   --   RDW 13.2  --   --   PLT 123*  --   --    Cardiac EnzymesNo results for input(s): TROPONINI in the last 168 hours. No results for input(s): TROPIPOC in the last 168 hours.  BNPNo results for input(s): BNP, PROBNP in the last 168 hours.  DDimer No results for input(s): DDIMER in the last 168 hours.  Radiology/Studies:  No results  found.  Assessment and Plan:   1. Severe aortic stenosis: He has severe, stage D aortic valve stenosis with reduced LV systolic function. I have personally reviewed the echo images. The aortic valve is thickened, calcified with limited leaflet mobility.  This likely represents severe low flow, low gradient aortic stenosis. I think he would benefit from AVR. The aortic valve is possibly bicuspid. He has had no imaging of his aortic arch.   I have reviewed the natural history of aortic stenosis with the patient and their family members  who are present today. We have discussed the limitations of medical therapy and the poor prognosis associated with symptomatic aortic stenosis. We have reviewed potential treatment options, including palliative medical therapy, conventional surgical aortic valve replacement, and transcatheter aortic valve replacement. We discussed treatment options in the context of the patient's specific comorbid medical conditions.   He will need to have a gated cardiac CTA and CTA of the chest, abdomen and pelvis to fully evaluate his aorta and valve. He would be a good candidate for surgical AVR or TAVR although his LV systolic dysfunction may put him at higher risk for open surgical AVR. After the CT scans are completed, I will ask one of the CT surgeons on our valve team to see him to review treatment options. He had a contrast load today with his cardiac cath. Will plan CT scans tomorrow if renal function is stable. He is being diuresed on the Advanced Heart Failure team today.   For questions or updates, please contact [place] Please consult [email or web address] for contact info under     Signed, Lauree Chandler, MD  03/28/2018 1:30 PM

## 2018-03-28 NOTE — Progress Notes (Signed)
  Echocardiogram 2D Echocardiogram has been performed.  Tyler Berger 03/28/2018, 11:04 AM

## 2018-03-28 NOTE — Progress Notes (Signed)
TR band removed at 1300. Site level zero  Dressing applied. Pt teaching done

## 2018-03-28 NOTE — H&P (Addendum)
Advanced Heart Failure Team History and Physical Note   PCP:  Unk Pinto, MD  PCP-Cardiology: No primary care provider on file.     Reason for Admission: Acute on chronic systolic CHF   HPI:    58 yo with history of cardiomyopathy and aortic stenosis is admitted today for management of CHF and AS.  In 2004, he was diagnosed with nonischemic cardiomyopathy, EF 20% at that time.  His EF recovered back up to 60-65%, but he was noted to have moderate aortic stenosis with a bicuspid valve. He was not seen by cardiology for a number of years.   He was in the ER in 1/20 with SVT that converted spontaneously.  He was set up for an echo and sent back to cardiology.  Echo showed EF 25-30%, diffuse hypokinesis, probably severe aortic stenosis with bicuspid aortic valve.  He was referred for RHC/LHC.    He reports limiting exertional dyspnea, now getting short of breath walking across the house. He works in a [employer] and has had a lot of dyspnea recently trying to do his job.  Mild orthopnea.  No chest pain.  No recent palpitations.  He drinks ETOH moderately and he smokes.   RHC/LHC was done today.  He has moderate coronary disease but no critical stenoses and no disease to explain his cardiomyopathy.  RHC showed markedly elevated PCWP, mixed pulmonary venous/pulmonary arterial hypertension, and low CI at 1.7.  I decided to admit him for diuresis, optimization of meds, and further workup of aortic stenosis.   RHC/LHC:  Coronary Findings   Diagnostic  Dominance: Right  Left Main  No significant disease.  Left Anterior Descending  Small D1 with 80% proximal stenosis. Moderate D2 with 50% proximal stenosis. Luminal irregularities in the LAD.  Left Circumflex  50% mid LCx stenosis.  Right Coronary Artery  50% distal RCA stenosis.  Intervention   No interventions have been documented.  Right Heart   Right Heart Pressures RHC Procedural Findings: Hemodynamics (mmHg) RA mean 12 RV  64/17 PA 70/32, mean 47 PCWP mean 30  Oxygen saturations: PA 61% AO 97%  Cardiac Output (Fick) 4.05  Cardiac Index (Fick) 1.72 PVR 4.2 WU     Review of Systems: All systems reviewed and negative except as per HPI.    Home Medications Prior to Admission medications   Medication Sig Start Date End Date Taking? Authorizing Provider  acetaminophen (TYLENOL) 500 MG tablet Take 1,000 mg by mouth every 6 (six) hours as needed for mild pain or headache.   Yes [provider]  ALPRAZolam Duanne Moron) 0.5 MG tablet Take 1 tablet (0.5 mg total) by mouth at bedtime as needed for anxiety. Patient taking differently: Take 0.25 mg by mouth at bedtime.  02/12/18  Yes Liane Comber, NP  aspirin 81 MG chewable tablet Chew 81 mg by mouth daily.    Yes [provider]  busPIRone (BUSPAR) 10 MG tablet Take 1 tablet (10 mg total) by mouth 3 (three) times daily. Patient taking differently: Take 10 mg by mouth at bedtime.  02/12/18  Yes Liane Comber, NP  Cholecalciferol (VITAMIN D PO) Take 4 capsules by mouth daily.    Yes [provider]  furosemide (LASIX) 80 MG tablet TAKE ONE-HALF TO ONE TABLET BY MOUTH ONCE DAILY FOR BLOOD PRESSURE AND  FLUID Patient taking differently: Take 80 mg by mouth daily.  02/12/18  Yes Liane Comber, NP  lisinopril (PRINIVIL,ZESTRIL) 20 MG tablet Take 1 tablet (20 mg total) by  mouth 2 (two) times daily. 06/01/17  Yes Vicie Mutters, PA-C  metoprolol succinate (TOPROL-XL) 100 MG 24 hr tablet Take 1 tablet (100 mg total) by mouth 2 (two) times daily. Take with or immediately following a meal. Patient taking differently: Take 50-100 mg by mouth See admin instructions. Take 100mg  in the AM and 50mg  in the PM.  Take with or immediately following a meal. 03/22/18 06/20/18 Yes Hochrein, Jeneen Rinks, MD  sertraline (ZOLOFT) 100 MG tablet Take 1 tablet (100 mg total) by mouth daily. Patient taking differently: Take 100 mg by mouth at bedtime.  02/12/18  Yes Liane Comber, NP  fluticasone furoate-vilanterol (BREO ELLIPTA) 100-25 MCG/INH AEPB Inhale 1 puff into the lungs daily. Rinse mouth with water after each use Patient not taking: Reported on 03/26/2018 11/30/17   Vicie Mutters, PA-C    Past Medical History: Past Medical History:  Diagnosis Date  . Anxiety   . Hypertension   . Viral myocarditis 2004  . Vitamin D deficiency     Past Surgical History: Past Surgical History:  Procedure Laterality Date  . HERNIA REPAIR Left 2010  . ORCHIECTOMY Left 1982  . VARICOSE VEIN SURGERY Left 05/1998    Family History:  Family History  Problem Relation Age of Onset  . Diabetes Mother   . Hypertension Mother   . Cancer Mother        breast  . Alzheimer's disease Mother   . Hypertension Father   . Cancer Father        head and neck  . Hypertension Sister     Social History: Social History   Socioeconomic History  . Marital status: Legally Separated    Spouse name: Not on file  . Number of children: Not on file  . Years of education: Not on file  . Highest education level: Not on file  Occupational History  . Not on file  Social Needs  . Financial resource strain: Not on file  . Food insecurity:    Worry: Not on file    Inability: Not on file  . Transportation needs:    Medical: Not on file    Non-medical: Not on file  Tobacco Use  . Smoking status: Current Every Day Smoker    Packs/day: 0.75    Years: 30.00    Pack years: 22.50    Types: Cigarettes  . Smokeless tobacco: Never Used  Substance and Sexual Activity  . Alcohol use: Yes    Alcohol/week: 2.0 standard drinks    Types: 2 Glasses of wine per week  . Drug use: Never  . Sexual activity: Not on file  Lifestyle  . Physical activity:    Days per week: Not on file    Minutes per session: Not on file  . Stress: Not on file  Relationships  . Social connections:    Talks on phone: Not on file    Gets together: Not on file    Attends religious service: Not on file     Active member of club or organization: Not on file    Attends meetings of clubs or organizations: Not on file    Relationship status: Not on file  Other Topics Concern  . Not on file  Social History Narrative   Married, med Designer, multimedia.  Two children.      Allergies:  No Known Allergies  Objective:    Vital Signs:   Temp:  [97.9 F (36.6 C)] 97.9 F (36.6 C) (02/19 0634) Pulse Rate:  [  73-85] 78 (02/19 1000) Resp:  [18-29] 28 (02/19 1000) BP: (91-113)/(56-82) 97/82 (02/19 1008) SpO2:  [0 %-100 %] 96 % (02/19 0947) Weight:  [107 kg] 107 kg (02/19 0634)   Filed Weights   03/28/18 0634  Weight: 107 kg     Physical Exam     General:  Well appearing. No respiratory difficulty HEENT: Normal Neck: Supple. JVP 12-14 cm. Carotids 2+ bilat; no bruits. No lymphadenopathy or thyromegaly appreciated. Cor: PMI lateral. Regular rate & rhythm. No rubs, gallops. 3/6 SEM RUSB with quiet S2.  Lungs: Mild crackles at bases.  Abdomen: Soft, nontender, nondistended. No hepatosplenomegaly. No bruits or masses. Good bowel sounds. Extremities: No cyanosis, clubbing, rash. Trace ankle edema.  Neuro: Alert & oriented x 3, cranial nerves grossly intact. moves all 4 extremities w/o difficulty. Affect pleasant.   Telemetry   NSR with PVCs (personally reviewed)  EKG   NSR, LBBB 144 msec (personally reviewed)  Labs     Basic Metabolic Panel: Recent Labs  Lab 03/27/18 1527 03/28/18 0904 03/28/18 0905  NA 140 140 141  K 4.3 3.8 3.6  CL 102  --   --   CO2 21  --   --   GLUCOSE 100*  --   --   BUN 20  --   --   CREATININE 1.02  --   --   CALCIUM 9.2  --   --     Liver Function Tests: No results for input(s): AST, ALT, ALKPHOS, BILITOT, PROT, ALBUMIN in the last 168 hours. No results for input(s): LIPASE, AMYLASE in the last 168 hours. No results for input(s): AMMONIA in the last 168 hours.  CBC: Recent Labs  Lab 03/27/18 1527 03/28/18 0904 03/28/18 0905  WBC 6.0  --   --    HGB 15.8 13.3 12.9*  HCT 45.8 39.0 38.0*  MCV 88  --   --   PLT 123*  --   --     Cardiac Enzymes: No results for input(s): CKTOTAL, CKMB, CKMBINDEX, TROPONINI in the last 168 hours.  BNP: BNP (last 3 results) No results for input(s): BNP in the last 8760 hours.  ProBNP (last 3 results) No results for input(s): PROBNP in the last 8760 hours.   CBG: No results for input(s): GLUCAP in the last 168 hours.  Coagulation Studies: No results for input(s): LABPROT, INR in the last 72 hours.  Imaging:  No results found.   Assessment/Plan   1. Acute on chronic systolic CHF: He has a history of nonischemic cardiomyopathy that recovered in the past back to normal EF.  At the time, he had moderate AS with suspected bicuspid aortic valve.  He drinks moderate ETOH but denies heavy drinking.  He has recurrent nonischemic cardiomyopathy, coronary angiography today does not show enough CAD to explain low EF.  Echo with EF 20-25% in 1/20.  I think his aortic stenosis is now severe, but given prior history, I am not sure that cardiomyopathy can be explained fully by AS.  He is markedly volume overloaded by RHC and has NYHA class IIIb symptoms.  Cardiac output is low.  I am admitting him for medication optimization, diuresis, and further AS workup.  - Lasix 80 mg IV bid, will give a dose now.  - Stop lisinopril, 36 hrs after last dose will start Entresto 24/26 bid.  - Continue Toprol XL 100 qam/50 qpm.  - Add spironolactone 12.5 mg daily.  - Add digoxin 0.125 mg daily with low output  on RHC.  2. Aortic stenosis.  Suspect bicuspid aortic valve with severe AS.  Did not do cath with dobutamine stress today given markedly elevated filling pressures.   - I will get a limited echo for AS today and assess, may need TEE but hopefully a good TTE will be diagnostic.  - Will have structural heart team evaluate.  With CHF, TAVR likely is going to be the best route for treatment.  3. ETOH: He drinks  moderately.  Needs to cut back.  4. Smoking: Encouraged him to quit.  5. CAD: Moderate but no target for intervention and does not explain cardiomyopathy.  - Continue ASA 81 daily.  - Add Crestor 10 mg daily and check lipids in am.    Loralie Champagne, MD 03/28/2018, 10:43 AM  Advanced Heart Failure Team Pager [phone] (M-F; 7a - 4p)  Please contact [facility] for night-coverage after hours (4p -7a ) and weekends on [email or web address]

## 2018-03-28 NOTE — Interval H&P Note (Signed)
History and Physical Interval Note:  03/28/2018 8:46 AM  Tyler Berger  has presented today for surgery, with the diagnosis of hf  The various methods of treatment have been discussed with the patient and family. After consideration of risks, benefits and other options for treatment, the patient has consented to  Procedure(s): RIGHT/LEFT HEART CATH AND CORONARY ANGIOGRAPHY (N/A) as a surgical intervention .  The patient's history has been reviewed, patient examined, no change in status, stable for surgery.  I have reviewed the patient's chart and labs.  Questions were answered to the patient's satisfaction.     Chrisanna Mishra [employer]

## 2018-03-29 ENCOUNTER — Inpatient Hospital Stay (HOSPITAL_COMMUNITY): Payer: Managed Care, Other (non HMO)

## 2018-03-29 ENCOUNTER — Encounter (HOSPITAL_COMMUNITY): Payer: Self-pay | Admitting: Cardiology

## 2018-03-29 DIAGNOSIS — I350 Nonrheumatic aortic (valve) stenosis: Secondary | ICD-10-CM

## 2018-03-29 LAB — BASIC METABOLIC PANEL
Anion gap: 13 (ref 5–15)
BUN: 16 mg/dL (ref 6–20)
CO2: 20 mmol/L — ABNORMAL LOW (ref 22–32)
Calcium: 9 mg/dL (ref 8.9–10.3)
Chloride: 105 mmol/L (ref 98–111)
Creatinine, Ser: 0.9 mg/dL (ref 0.61–1.24)
GFR calc Af Amer: >60 mL/min (ref >60)
GLUCOSE: 150 mg/dL — AB (ref 70–99)
Potassium: 3.7 mmol/L (ref 3.5–5.1)
Sodium: 138 mmol/L (ref 135–145)

## 2018-03-29 LAB — HIV ANTIBODY (ROUTINE TESTING W REFLEX): HIV SCREEN 4TH GENERATION: NONREACTIVE

## 2018-03-29 MED ORDER — POTASSIUM CHLORIDE CRYS ER 20 MEQ PO TBCR
20.0000 meq | EXTENDED_RELEASE_TABLET | Freq: Once | ORAL | Status: AC
  Administered 2018-03-29: 20 meq via ORAL
  Filled 2018-03-29: qty 1

## 2018-03-29 MED ORDER — IOPAMIDOL (ISOVUE-370) INJECTION 76%
80.0000 mL | Freq: Once | INTRAVENOUS | Status: AC | PRN
  Administered 2018-03-29: 100 mL via INTRAVENOUS

## 2018-03-29 NOTE — Progress Notes (Addendum)
Patient ID: Elvyn Krohn, male   DOB: 25-Aug-1960, 58 y.o.   MRN: [id number]     Advanced Heart Failure Rounding Note  PCP-Cardiologist: No primary care provider on file.   Subjective:    Still short of breath but diuresed well overnight, weight trending down.  Structural heart evaluation has started.   Echo: EF 20-25%, mildly dilated LV, mild LVH, mild MR, mildly decreased RV function, severe aortic stenosis (bicuspid valve by history but hard to tell).   RHC/LHC Coronary Findings  Diagnostic  Dominance: Right  Left Main  No significant disease.  Left Anterior Descending  Small D1 with 80% proximal stenosis. Moderate D2 with 50% proximal stenosis. Luminal irregularities in the LAD.  Left Circumflex  50% mid LCx stenosis.  Right Coronary Artery  50% distal RCA stenosis.  Intervention   No interventions have been documented.  Right Heart   Right Heart Pressures RHC Procedural Findings: Hemodynamics (mmHg) RA mean 12 RV 64/17 PA 70/32, mean 47 PCWP mean 30  Oxygen saturations: PA 61% AO 97%  Cardiac Output (Fick) 4.05  Cardiac Index (Fick) 1.72 PVR 4.2 WU     Objective:   Weight Range: 103.6 kg Body mass index is 28.55 kg/m.   Vital Signs:   Temp:  [97.3 F (36.3 C)-98 F (36.7 C)] 97.8 F (36.6 C) (02/20 0521) Pulse Rate:  [60-85] 83 (02/20 0521) Resp:  [18-29] 18 (02/20 0521) BP: (91-156)/(50-133) 115/83 (02/20 0521) SpO2:  [0 %-100 %] 96 % (02/20 0521) Weight:  [103.6 kg] 103.6 kg (02/20 0520) Last BM Date: 03/28/18  Weight change: Filed Weights   03/28/18 0634 03/29/18 0520  Weight: 107 kg 103.6 kg    Intake/Output:   Intake/Output Summary (Last 24 hours) at 03/29/2018 0805 Last data filed at 03/29/2018 0044 Gross per 24 hour  Intake -  Output 1400 ml  Net -1400 ml      Physical Exam    General:  Well appearing. No resp difficulty HEENT: Normal Neck: Supple. JVP 8-9 with HJR. Carotids 2+ bilat; no bruits. No lymphadenopathy or  thyromegaly appreciated. Cor: PMI nondisplaced. Regular rate & rhythm. No rubs, gallops. 3/6 SEM RUSB. Lungs: Clear Abdomen: Soft, nontender, nondistended. No hepatosplenomegaly. No bruits or masses. Good bowel sounds. Extremities: No cyanosis, clubbing, rash. Trace ankle edema.  Neuro: Alert & orientedx3, cranial nerves grossly intact. moves all 4 extremities w/o difficulty. Affect pleasant   Telemetry   NSR with LBBB, PVCs (personally reviewed).   Labs    CBC Recent Labs    03/27/18 1527  03/28/18 0905 03/28/18 1859  WBC 6.0  --   --  5.4  NEUTROABS  --   --   --  3.6  HGB 15.8   < > 12.9* 14.9  HCT 45.8   < > 38.0* 45.3  MCV 88  --   --  88.8  PLT 123*  --   --  PLATELET CLUMPS NOTED ON SMEAR, UNABLE TO ESTIMATE   < > = values in this interval not displayed.   Basic Metabolic Panel Recent Labs    03/28/18 1859 03/29/18 0337  NA 136 138  K 3.5 3.7  CL 104 105  CO2 21* 20*  GLUCOSE 107* 150*  BUN 16 16  CREATININE 0.94  0.95 0.90  CALCIUM 8.8* 9.0   Liver Function Tests Recent Labs    03/28/18 1859  AST 17  ALT 16  ALKPHOS 52  BILITOT 1.5*  PROT 6.7  ALBUMIN 3.8   No results  for input(s): LIPASE, AMYLASE in the last 72 hours. Cardiac Enzymes No results for input(s): CKTOTAL, CKMB, CKMBINDEX, TROPONINI in the last 72 hours.  BNP: BNP (last 3 results) Recent Labs    03/28/18 1859  BNP 1,155.1*    ProBNP (last 3 results) No results for input(s): PROBNP in the last 8760 hours.   D-Dimer No results for input(s): DDIMER in the last 72 hours. Hemoglobin A1C No results for input(s): HGBA1C in the last 72 hours. Fasting Lipid Panel Recent Labs    03/28/18 1859  CHOL 140  HDL 21*  LDLCALC 104*  TRIG 75  CHOLHDL 6.7   Thyroid Function Tests Recent Labs    03/28/18 1859  TSH 1.643    Other results:   Imaging     No results found.   Medications:     Scheduled Medications: . ALPRAZolam  0.25 mg Oral QHS  . aspirin  81 mg  Oral Daily  . busPIRone  10 mg Oral QHS  . digoxin  0.125 mg Oral Daily  . furosemide  80 mg Intravenous BID  . heparin  5,000 Units Subcutaneous Q8H  . metoprolol succinate  100 mg Oral Daily   And  . metoprolol succinate  50 mg Oral QHS  . potassium chloride  40 mEq Oral Daily  . rosuvastatin  10 mg Oral q1800  . sacubitril-valsartan  1 tablet Oral BID  . sertraline  100 mg Oral QHS  . sodium chloride flush  3 mL Intravenous Q12H  . sodium chloride flush  3 mL Intravenous Q12H  . sodium chloride flush  3 mL Intravenous Q12H  . spironolactone  12.5 mg Oral Daily     Infusions: . sodium chloride    . sodium chloride 10 mL/hr at 03/28/18 0739  . sodium chloride    . sodium chloride       PRN Medications:  sodium chloride, sodium chloride, sodium chloride, acetaminophen, ondansetron (ZOFRAN) IV, sodium chloride flush, sodium chloride flush, sodium chloride flush   Assessment/Plan   1. Acute on chronic systolic CHF: He has a history of nonischemic cardiomyopathy that recovered in the past back to normal EF.  At the time, he had moderate AS with suspected bicuspid aortic valve.  He drinks moderate ETOH but denies heavy drinking.  He has recurrent nonischemic cardiomyopathy, coronary angiography does not show enough CAD to explain low EF.  Echo with EF 20-25% by echo this admission.  I think his aortic stenosis is now severe, but given prior history, I am not sure that cardiomyopathy can be explained fully by AS.  He was markedly volume overloaded by RHC and had NYHA class IIIb symptoms. Cardiac output was low. He diuresed well overnight, still some volume on exam but better.  Feels short of breath.  - Continue Lasix 80 mg IV bid.   - Start Entresto 24/26 bid.  - Continue Toprol XL 100 qam/50 qpm.  - Added spironolactone 12.5 mg daily.  - Added digoxin 0.125 mg daily with low output on RHC.  - He has LBBB.  If EF remains low, will need CRT-D.  2. Aortic stenosis.  Suspect bicuspid  aortic valve with severe AS.   - Structural heart team has seen, will need decision on TAVR versus SAVR.  Young age, but significant cardiomyopathy with low output. He will have scans for TAVR today and will need to be seen by surgeon.  3. ETOH: He drinks moderately but does not seem heavy enough to have caused cardiomyopathy.  4. Smoking: Encouraged him to quit.  5. CAD: Moderate but no target for intervention and does not explain cardiomyopathy.  - Continue ASA 81 daily.  - Added Crestor 10 mg daily.   Length of Stay: 1  Loralie Champagne, MD  03/29/2018, 8:05 AM  Advanced Heart Failure Team Pager [phone] (M-F; 7a - 4p)  Please contact [facility] for night-coverage after hours (4p -7a ) and weekends on [email or web address]

## 2018-03-29 NOTE — Progress Notes (Signed)
6 beats of vtach and pvc is high 26-30 per minute per CCMD Tiffany. [place] paged [phone]

## 2018-03-29 NOTE — Progress Notes (Signed)
Pt's Bp was soft 83/54 and asymptomatic. Paged cardiology and stated to hold Entresto and metoprolol for tonight and to monitor BP. Will continue to monitor.

## 2018-03-29 NOTE — Care Management Note (Signed)
Case Management Note  Patient Details  Name: Tyler Berger MRN: [id number] Date of Birth: 17-Aug-1960  Subjective/Objective: CHF                  Action/Plan: Patient lives at home; PCP:  Unk Pinto, MD; has private insurance with [employer] with prescription drug coverage; CM following for progression of care.  Expected Discharge Date:   possibly 04/02/2018               Expected Discharge Plan:  Home/Self Care  Discharge planning Services  CM Consult  Status of Service:  In process, will continue to follow  Sherrilyn Rist [phone] 03/29/2018, 9:02 AM

## 2018-03-29 NOTE — Progress Notes (Signed)
Patient states having more urine output through out the day but has been going to the bathroom and not using the urinal.  Education was given on importance of output documentation.

## 2018-03-29 NOTE — Evaluation (Signed)
Physical Therapy Evaluation Patient Details Name: Tyler Berger MRN: [id number] DOB: 1960-04-28 Today's Date: 03/29/2018   History of Present Illness  Alvan Culpepper is a 58 y.o. male with a history of non-ischemic cardiomyopathy, chronic systolic CHF, SVT, tobacco abuse, non-obstructive CAD, hypertension, anxiety and aortic stenosis admitted with volume overload following outpatient diagnostic cardiac catheterization who is being seen today for the evaluation of aortic stenosis at the request of Dr. Aundra Dubin.  Clinical Impression  Pt admitted with above diagnosis. Pt currently with functional limitations due to the deficits listed below (see PT Problem List). Pt was able to ambulate without device with steady gait overall.  Practiced sit to stands with pt as he is being worked up for possible CABG.  Will follow acutely. Pt will benefit from skilled PT to increase their independence and safety with mobility to allow discharge to the venue listed below.      Follow Up Recommendations No PT follow up;Supervision - Intermittent    Equipment Recommendations  None recommended by PT    Recommendations for Other Services       Precautions / Restrictions Precautions Precautions: None Restrictions Weight Bearing Restrictions: No      Mobility  Bed Mobility Overal bed mobility: Independent                Transfers Overall transfer level: Independent                  Ambulation/Gait Ambulation/Gait assistance: Supervision Gait Distance (Feet): 400 Feet Assistive device: None Gait Pattern/deviations: Step-through pattern;Decreased stride length   Gait velocity interpretation: >2.62 ft/sec, indicative of community ambulatory General Gait Details: Pt was able to ambulate in hallways without LOB.  Can withstand challenges as well.    Stairs            Wheelchair Mobility    Modified Rankin (Stroke Patients Only)       Balance                                  Standardized Balance Assessment Standardized Balance Assessment : Dynamic Gait Index   Dynamic Gait Index Level Surface: Normal Change in Gait Speed: Normal Gait with Horizontal Head Turns: Normal Gait with Vertical Head Turns: Normal Gait and Pivot Turn: Normal Step Over Obstacle: Normal Step Around Obstacles: Normal Steps: Normal Total Score: 24       Pertinent Vitals/Pain Pain Assessment: No/denies pain.  VSS    Home Living Family/patient expects to be discharged to:: Private residence Living Arrangements: Spouse/significant other(husband) Available Help at Discharge: Family;Available PRN/intermittently Type of Home: House Home Access: Stairs to enter Entrance Stairs-Rails: None Entrance Stairs-Number of Steps: 1 Home Layout: One level Home Equipment: Wheelchair - manual;Tub bench Additional Comments: Walks and lifts at his job    Prior Function Level of Independence: Independent         Comments: working full time more than 40 hours week. Drives as well.      Hand Dominance   Dominant Hand: Right    Extremity/Trunk Assessment   Upper Extremity Assessment Upper Extremity Assessment: Defer to OT evaluation    Lower Extremity Assessment Lower Extremity Assessment: Overall WFL for tasks assessed    Cervical / Trunk Assessment Cervical / Trunk Assessment: Normal  Communication   Communication: No difficulties  Cognition Arousal/Alertness: Awake/alert Behavior During Therapy: WFL for tasks assessed/performed Overall Cognitive Status: Within Functional Limits for tasks assessed  General Comments      Exercises     Assessment/Plan    PT Assessment Patient needs continued PT services  PT Problem List Decreased activity tolerance;Decreased balance;Decreased mobility;Decreased knowledge of use of DME;Decreased safety awareness;Decreased knowledge of precautions       PT Treatment  Interventions DME instruction;Gait training;Functional mobility training;Therapeutic activities;Therapeutic exercise;Balance training;Patient/family education    PT Goals (Current goals can be found in the Care Plan section)  Acute Rehab PT Goals Patient Stated Goal: to go home PT Goal Formulation: With patient Time For Goal Achievement: 04/12/18 Potential to Achieve Goals: Good    Frequency Min 3X/week   Barriers to discharge        Co-evaluation               AM-PAC PT "6 Clicks" Mobility  Outcome Measure Help needed turning from your back to your side while in a flat bed without using bedrails?: None Help needed moving from lying on your back to sitting on the side of a flat bed without using bedrails?: None Help needed moving to and from a bed to a chair (including a wheelchair)?: None Help needed standing up from a chair using your arms (e.g., wheelchair or bedside chair)?: A Little Help needed to walk in hospital room?: A Little Help needed climbing 3-5 steps with a railing? : A Little 6 Click Score: 21    End of Session Equipment Utilized During Treatment: Gait belt Activity Tolerance: Patient tolerated treatment well Patient left: in bed;with call bell/phone within reach;with family/visitor present Nurse Communication: Mobility status PT Visit Diagnosis: Muscle weakness (generalized) (M62.81)    Time: 2263-3354 PT Time Calculation (min) (ACUTE ONLY): 27 min   Charges:   PT Evaluation $PT Eval Moderate Complexity: 1 Mod PT Treatments $Gait Training: 8-22 mins        Shoshana Johal,PT Acute Rehabilitation Services Pager:  [phone]  Office:  [place] 03/29/2018, 1:40 PM

## 2018-03-30 ENCOUNTER — Inpatient Hospital Stay (HOSPITAL_COMMUNITY): Payer: Managed Care, Other (non HMO)

## 2018-03-30 ENCOUNTER — Ambulatory Visit: Payer: Managed Care, Other (non HMO) | Admitting: Cardiology

## 2018-03-30 DIAGNOSIS — Z01810 Encounter for preprocedural cardiovascular examination: Secondary | ICD-10-CM

## 2018-03-30 LAB — CBC WITH DIFFERENTIAL/PLATELET
Abs Immature Granulocytes: 0.02 10*3/uL (ref 0.00–0.07)
BASOS PCT: 1 %
Basophils Absolute: 0 10*3/uL (ref 0.0–0.1)
Eosinophils Absolute: 0.1 10*3/uL (ref 0.0–0.5)
Eosinophils Relative: 3 %
HCT: 45.7 % (ref 39.0–52.0)
Hemoglobin: 15 g/dL (ref 13.0–17.0)
Immature Granulocytes: 0 %
Lymphocytes Relative: 17 %
Lymphs Abs: 0.9 10*3/uL (ref 0.7–4.0)
MCH: 29 pg (ref 26.0–34.0)
MCHC: 32.8 g/dL (ref 30.0–36.0)
MCV: 88.2 fL (ref 80.0–100.0)
Monocytes Absolute: 0.6 10*3/uL (ref 0.1–1.0)
Monocytes Relative: 12 %
Neutro Abs: 3.7 10*3/uL (ref 1.7–7.7)
Neutrophils Relative %: 67 %
PLATELETS: 110 10*3/uL — AB (ref 150–400)
RBC: 5.18 MIL/uL (ref 4.22–5.81)
RDW: 13.9 % (ref 11.5–15.5)
WBC: 5.4 10*3/uL (ref 4.0–10.5)
nRBC: 0 % (ref 0.0–0.2)

## 2018-03-30 LAB — BASIC METABOLIC PANEL
Anion gap: 12 (ref 5–15)
BUN: 16 mg/dL (ref 6–20)
CO2: 21 mmol/L — ABNORMAL LOW (ref 22–32)
Calcium: 9.2 mg/dL (ref 8.9–10.3)
Chloride: 106 mmol/L (ref 98–111)
Creatinine, Ser: 0.77 mg/dL (ref 0.61–1.24)
GFR calc Af Amer: >60 mL/min (ref >60)
Glucose, Bld: 120 mg/dL — ABNORMAL HIGH (ref 70–99)
Potassium: 4 mmol/L (ref 3.5–5.1)
Sodium: 139 mmol/L (ref 135–145)

## 2018-03-30 MED ORDER — TORSEMIDE 20 MG PO TABS: 40.0000 mg | ORAL_TABLET | Freq: Every day | ORAL | 6 refills | Status: DC

## 2018-03-30 MED ORDER — ROSUVASTATIN CALCIUM 10 MG PO TABS: 10.0000 mg | ORAL_TABLET | Freq: Every day | ORAL | 6 refills | Status: DC

## 2018-03-30 MED ORDER — METOPROLOL SUCCINATE ER 50 MG PO TB24: 50.0000 mg | ORAL_TABLET | Freq: Two times a day (BID) | ORAL | 6 refills | Status: DC

## 2018-03-30 MED ORDER — POTASSIUM CHLORIDE CRYS ER 20 MEQ PO TBCR: 20.0000 meq | EXTENDED_RELEASE_TABLET | Freq: Two times a day (BID) | ORAL | 6 refills | Status: DC

## 2018-03-30 MED ORDER — SPIRONOLACTONE 25 MG PO TABS: 12.5000 mg | ORAL_TABLET | Freq: Every day | ORAL | 6 refills | Status: DC

## 2018-03-30 MED ORDER — DIGOXIN 125 MCG PO TABS: 0.1250 mg | ORAL_TABLET | Freq: Every day | ORAL | 6 refills | Status: DC

## 2018-03-30 MED ORDER — SACUBITRIL-VALSARTAN 24-26 MG PO TABS: 1.0000 | ORAL_TABLET | Freq: Two times a day (BID) | ORAL | 6 refills | Status: DC

## 2018-03-30 MED ORDER — METOPROLOL SUCCINATE ER 50 MG PO TB24
50.0000 mg | ORAL_TABLET | Freq: Two times a day (BID) | ORAL | Status: DC
  Administered 2018-03-30: 50 mg via ORAL
  Filled 2018-03-30: qty 1

## 2018-03-30 MED ORDER — TORSEMIDE 20 MG PO TABS
40.0000 mg | ORAL_TABLET | Freq: Every day | ORAL | Status: DC
  Administered 2018-03-30: 40 mg via ORAL
  Filled 2018-03-30: qty 2

## 2018-03-30 MED FILL — ENTRESTO 24 MG-26 MG TABLET: 24-26 | 30 days supply | Qty: 60 | Fill #0 | Status: TO

## 2018-03-30 NOTE — Progress Notes (Signed)
Patients discharge packet and medication education was given with teach back. Peripheral IV removed clean dry and intact, pressure and dressing applied. Patient denies chest pain or shortness of breath. Patients belongings with patient: clothes, cell phone and charger,shoes,glasses. Family at bedside. Pharmacy has brought medication already. Patient denies further questions or concerns. Patient was taken by nurse tech in wheelchair. CCMD called.

## 2018-03-30 NOTE — Discharge Summary (Signed)
Advanced Heart Failure Team  Discharge Summary   Patient ID: Tyler Berger MRN: [id number], DOB/AGE: 1960-04-11 58 y.o. Admit date: 03/28/2018 D/C date:     03/30/2018   Primary Discharge Diagnoses:  1. A/C Systolic HF 2. Aortic Stenosis 3. ETOH 4. Smoking  5. CAD  Hospital Course: Tyler Berger is a 58 yo with history of cardiomyopathy and aortic stenosis is admitted today for management of CHF and AS.  In 2004, he was diagnosed with nonischemic cardiomyopathy, EF 20% at that time.  His EF recovered back up to 60-65%, but he was noted to have moderate aortic stenosis with a bicuspid valve.  Admitted after RHC/LHC with elevated PCWP, mixed pulmonary venous/pulmonary arterial hypertension, and low CI at 1.7. Admitted for diuresis and optimization. Severe AS noted so he was referred to structural heart then to TCTS AVR. He will see Tyler Berger on Monday.   See below for detailed problem list.   1. Acute on chronic systolic CHF: He has a history of nonischemic cardiomyopathy that recovered in the past back to normal EF. At the time, he had moderate AS with suspected bicuspid aortic valve. He drinks moderate ETOH but denies heavy drinking. He has recurrent nonischemic cardiomyopathy, coronary angiography does not show enough CAD to explain low EF. Echo with EF 20-25% by echo this admission. Aortic stenosis is now severe, but given prior history, not sure that cardiomyopathy can be explained fully by AS. He was markedly volume overloaded by RHC and had NYHA class IIIb symptoms. Cardiac output was low.  - Diuresed with IV lasix and transitioned to torsemide 40 mg daily.  - HF medications optimized. Started on digoxin and entresto.  - After cath toprol xl was cut back to 50 mg bid.    - Continue spironolactone 12.5 mg daily.  -- He has LBBB.  If EF remains low, will need CRT-D.  2. Aortic stenosis. Bicuspid aortic valve with severe AS.  - Structural heart team has seen and he had TAVR scans. He has a  large valve area, and appears too large for Sapien 3 or Medtronic TAVR valve, suspect surgery will be his best option.  - He was referred to TCTS. Tyler Berger will see him on Monday in the office. Carotid dopplers completed on the day of discharge.PFTs will be set up by Tyler Berger office.   3. ETOH: He drinks moderately but does not seem heavy enough to have caused cardiomyopathy.  4. Smoking: Encouraged him to quit. 5. CAD: Moderate but no target for intervention and does not explain cardiomyopathy.  - Continue ASA 81 daily.  - He was started on crestor 10 mg daily  Discharge Weight: 225 pounds.  Discharge Vitals: Blood pressure (!) 89/60, pulse 63, temperature 97.7 F (36.5 C), temperature source Oral, resp. rate 18, height 6\' 3"  (1.905 m), weight 102.2 kg, SpO2 98 %.  Labs: Lab Results  Component Value Date   WBC 5.4 03/30/2018   HGB 15.0 03/30/2018   HCT 45.7 03/30/2018   MCV 88.2 03/30/2018   PLT 110 (L) 03/30/2018    Recent Labs  Lab 03/28/18 1859  03/30/18 0503  NA 136   < > 139  K 3.5   < > 4.0  CL 104   < > 106  CO2 21*   < > 21*  BUN 16   < > 16  CREATININE 0.94  0.95   < > 0.77  CALCIUM 8.8*   < > 9.2  PROT 6.7  --   --  BILITOT 1.5*  --   --   ALKPHOS 52  --   --   ALT 16  --   --   AST 17  --   --   GLUCOSE 107*   < > 120*   < > = values in this interval not displayed.   Lab Results  Component Value Date   CHOL 140 03/28/2018   HDL 21 (L) 03/28/2018   LDLCALC 104 (H) 03/28/2018   TRIG 75 03/28/2018   BNP (last 3 results) Recent Labs    03/28/18 1859  BNP 1,155.1*    ProBNP (last 3 results) No results for input(s): PROBNP in the last 8760 hours.   Diagnostic Studies/Procedures  Echo: EF 20-25%, mildly dilated LV, mild LVH, mild Tyler, mildly decreased RV function, severe aortic stenosis (bicuspid valve by history but hard to tell).   RHC/LHC Coronary Findings  Diagnostic  Dominance: Right  Left Main  No significant disease.  Left Anterior  Descending  Small D1 with 80% proximal stenosis. Moderate D2 with 50% proximal stenosis. Luminal irregularities in the LAD.  Left Circumflex  50% mid LCx stenosis.  Right Coronary Artery  50% distal RCA stenosis.  Intervention   No interventions have been documented.  Right Heart   Right Heart Pressures RHC Procedural Findings: Hemodynamics (mmHg) RA mean 12 RV 64/17 PA 70/32, mean 47 PCWP mean 30  Oxygen saturations: PA 61% AO 97%  Cardiac Output (Fick) 4.05  Cardiac Index (Fick) 1.72 PVR 4.2 WU     Dg Chest 2 View  Result Date: 03/29/2018 CLINICAL DATA:  Shortness of breath EXAM: CHEST - 2 VIEW COMPARISON:  02/08/2018 FINDINGS: Cardiomegaly. Mild diffuse interstitial pulmonary opacity. Disc degenerative disease of the thoracic spine. IMPRESSION: Mild diffuse interstitial pulmonary opacity, likely edema in the setting of cardiomegaly. There is no focal airspace opacity. Electronically Signed   By: Tyler Berger M.D.   On: 03/29/2018 08:32   Ct Coronary Morph W/cta Cor W/score W/ca W/cm &/or Wo/cm  Addendum Date: 03/29/2018   ADDENDUM REPORT: 03/29/2018 16:46 CLINICAL DATA:  Aortic stenosis EXAM: Cardiac TAVR CT TECHNIQUE: The patient was scanned on a Siemens Force 660 slice scanner. A 120 kV retrospective scan was triggered in the descending thoracic aorta at 111 HU's. Gantry rotation speed was 270 msecs and collimation was .9 mm. No beta blockade or nitro were given. The 3D data set was reconstructed in 5% intervals of the R-R cycle. Systolic and diastolic phases were analyzed on a dedicated work station using MPR, MIP and VRT modes. The patient received 80 cc of contrast. FINDINGS: Aortic Valve: Bicuspid with heavy calcium fusion of right and left cusps. There is large nodular area of calcification at base of both leaflets Aorta: Dilated root normal arch vessels no coarctation Sinotubular Junction: 34 mm Ascending Thoracic Aorta: 43 mm Aortic Arch: 28 mm Descending Thoracic  Aorta: 23 mm Sinus of Valsalva Measurements: Non-coronary: 35.7 mm Right - coronary: 38.2 mm Left - coronary: 37.4 mm Coronary Artery Height above Annulus: Left Main: 13.8 mm above annulus Right Coronary: 17 mm above annulus Virtual Basal Annulus Measurements: Maximum/Minimum Diameter: 30 mm x 36 mm Perimeter: 113 mm Area: 946 mm2 Coronary Arteries: Sufficient height above annulus for deployment but concern for LM as fused cusp heavily calcified and broad would appear to be at risk for occlusion Optimum Fluoroscopic Angle for Delivery: LAO 14 mm CAudal 14 mm IMPRESSION: 1. Heavily calcified bicuspid valve with annular area of 946 mm2 would appear to  be too large for Medtronic or Sapien 3 valve. The dense opposed nodular calcium in the annulus also Would appear to increase risk of peri valvular regurgitation 2. Coronary arteries sufficient height above annulus for deployment by traditional criteria but concern for broad based fused R/L leaflet being heavily calcified and potential obstruction of LM 3. Optimum angiographic angle for deployment LAO 14 Caudal 14 degrees 4.  Dilated aortic root 4.3 cm Jenkins Rouge Electronically Signed   By: Jenkins Rouge M.D.   On: 03/29/2018 16:46   Result Date: 03/29/2018 EXAM: OVER-READ INTERPRETATION  CT CHEST The following report is an over-read performed by radiologist Tyler. Vinnie Langton of [facility], [place] on 03/29/2018. This over-read does not include interpretation of cardiac or coronary anatomy or pathology. The coronary calcium score/coronary CTA interpretation by the cardiologist is attached. COMPARISON:  Chest CT 08/18/2006. FINDINGS: Extracardiac findings will be described separately under dictation for contemporaneously obtained CTA chest, abdomen and pelvis. IMPRESSION: Please see separate dictation for contemporaneously obtained CTA chest, abdomen and pelvis dated 03/29/2018 for full description of relevant extracardiac findings. Electronically Signed: By:  Vinnie Langton M.D. On: 03/29/2018 14:37   Ct Angio Chest Aorta W/cm &/or Wo/cm  Result Date: 03/29/2018 CLINICAL DATA:  58 year old male with history of severe aortic stenosis. Preprocedural study prior to potential transcatheter aortic valve replacement (TAVR). EXAM: CT ANGIOGRAPHY CHEST, ABDOMEN AND PELVIS TECHNIQUE: Multidetector CT imaging through the chest, abdomen and pelvis was performed using the standard protocol during bolus administration of intravenous contrast. Multiplanar reconstructed images and MIPs were obtained and reviewed to evaluate the vascular anatomy. CONTRAST:  120mL ISOVUE-370 IOPAMIDOL (ISOVUE-370) INJECTION 76% COMPARISON:  CT the abdomen and pelvis 12/14/2017. Chest CT 08/17/2016. FINDINGS: CTA CHEST FINDINGS Cardiovascular: Heart size is mildly enlarged. There is no significant pericardial fluid, thickening or pericardial calcification. There is aortic atherosclerosis, as well as atherosclerosis of the great vessels of the mediastinum and the coronary arteries, including calcified atherosclerotic plaque in the left main, left anterior descending, left circumflex and right coronary arteries. Severe thickening calcification of the aortic valve. Mediastinum/Lymph Nodes: No pathologically enlarged mediastinal or hilar lymph nodes. Esophagus is unremarkable in appearance. No axillary lymphadenopathy. Lungs/Pleura: No suspicious appearing pulmonary nodules or masses are noted. No acute consolidative airspace disease. Small bilateral pleural effusions lying dependently (right greater than left). Musculoskeletal/Soft Tissues: There are no aggressive appearing lytic or blastic lesions noted in the visualized portions of the skeleton. CTA ABDOMEN AND PELVIS FINDINGS Hepatobiliary: No suspicious cystic or solid hepatic lesions. No intra or extrahepatic biliary ductal dilatation. Gallbladder is normal in appearance. Pancreas: No pancreatic mass. No pancreatic ductal dilatation. No pancreatic  or peripancreatic fluid or inflammatory changes. Spleen: Visualized portions are unremarkable. Adrenals/Urinary Tract: 1.7 cm low-attenuation lesion in the anterior aspect of the interpolar region of the right kidney, compatible with a simple cyst. There are 2 intermediate to high attenuation lesions in the left kidney, measuring up to 2.2 cm in the lower pole of the left kidney, stable compared to the prior examination 12/14/2017, statistically likely to represent a proteinaceous/hemorrhagic cysts. Other subcentimeter low-attenuation lesions in the kidneys, too small to characterize, but statistically likely to represent tiny cysts. Bilateral adrenal glands are normal in appearance. No hydroureteronephrosis. Urinary bladder is normal in appearance. Stomach/Bowel: Normal appearance of the stomach. No pathologic dilatation of small bowel or colon. Normal appendix. Vascular/Lymphatic: Aortic atherosclerosis, without evidence of aneurysm or dissection in the abdominal or pelvic vasculature. Vascular findings and measurements pertinent to potential TAVR procedure,  as detailed below. No lymphadenopathy noted in the abdomen or pelvis. Reproductive: Prostate gland and seminal vesicles are unremarkable in appearance. Other: No significant volume of ascites.  No pneumoperitoneum. Musculoskeletal: There are no aggressive appearing lytic or blastic lesions noted in the visualized portions of the skeleton. VASCULAR MEASUREMENTS PERTINENT TO TAVR: AORTA: Minimal Aortic Diameter-17 x 16 mm Severity of Aortic Calcification-mild-to-moderate RIGHT PELVIS: Right Common Iliac Artery - Minimal Diameter-10.1 x 8.6 mm Tortuosity-mild Calcification-moderate Right External Iliac Artery - Minimal Diameter-7.4 x 5.9 mm Tortuosity - mild Calcification-none Right Common Femoral Artery - Minimal Diameter-7.8 x 6.6 mm Tortuosity - mild Calcification-mild LEFT PELVIS: Left Common Iliac Artery - Minimal Diameter-10.2 x 8.4 mm Tortuosity-severe  Calcification-mild Left External Iliac Artery - Minimal Diameter-8.0 x 7.7 mm Tortuosity-moderate Calcification-mild Left Common Femoral Artery - Minimal Diameter-7.2 x 8.0 mm Tortuosity-mild Calcification-mild Review of the MIP images confirms the above findings. IMPRESSION: 1. Vascular findings and measurements pertinent to potential TAVR procedure, as detailed above. 2. Severe thickening calcification of the aortic valve, compatible with the reported clinical history of severe aortic stenosis. 3. Aortic atherosclerosis, in addition to left main and 3 vessel coronary artery disease. Please note that although the presence of coronary artery calcium documents the presence of coronary artery disease, the severity of this disease and any potential stenosis cannot be assessed on this non-gated CT examination. Assessment for potential risk factor modification, dietary therapy or pharmacologic therapy may be warranted, if clinically indicated. 4. Small bilateral pleural effusions (right greater than left). 5. Additional incidental findings, as above. Electronically Signed   By: Vinnie Langton M.D.   On: 03/29/2018 15:45   Vas US Doppler Pre Cabg  Result Date: 03/30/2018 PREOPERATIVE VASCULAR EVALUATION  Indications: Pre-CABG. Performing Technologist: Carlos Levering Rvt  Examination Guidelines: A complete evaluation includes B-mode imaging, spectral Doppler, color Doppler, and power Doppler as needed of all accessible portions of each vessel. Bilateral testing is considered an integral part of a complete examination. Limited examinations for reoccurring indications may be performed as noted.  Right Carotid Findings: +----------+--------+--------+--------+-----------------------+--------+           PSV cm/sEDV cm/sStenosisDescribe               Comments +----------+--------+--------+--------+-----------------------+--------+ CCA Prox  67      6               smooth and heterogenous          +----------+--------+--------+--------+-----------------------+--------+ CCA Distal77      22              smooth and heterogenous         +----------+--------+--------+--------+-----------------------+--------+ ICA Prox  51      17              smooth and heterogenous         +----------+--------+--------+--------+-----------------------+--------+ ICA Distal54      25                                              +----------+--------+--------+--------+-----------------------+--------+ ECA       61      9                                               +----------+--------+--------+--------+-----------------------+--------+  Portions of this table do not appear on this page. +----------+--------+-------+--------+------------+           PSV cm/sEDV cmsDescribeArm Pressure +----------+--------+-------+--------+------------+ Subclavian84                     88           +----------+--------+-------+--------+------------+ +---------+--------+--+--------+-+---------+ VertebralPSV cm/s22EDV cm/s5Antegrade +---------+--------+--+--------+-+---------+ Left Carotid Findings: +----------+--------+--------+--------+-----------------------+--------+           PSV cm/sEDV cm/sStenosisDescribe               Comments +----------+--------+--------+--------+-----------------------+--------+ CCA Prox  75      20              smooth and heterogenous         +----------+--------+--------+--------+-----------------------+--------+ CCA Distal75      17              smooth and heterogenous         +----------+--------+--------+--------+-----------------------+--------+ ICA Prox  43      14              smooth and heterogenous         +----------+--------+--------+--------+-----------------------+--------+ ICA Distal53      17                                              +----------+--------+--------+--------+-----------------------+--------+ ECA       65       8                                               +----------+--------+--------+--------+-----------------------+--------+ +----------+--------+--------+--------+------------+ SubclavianPSV cm/sEDV cm/sDescribeArm Pressure +----------+--------+--------+--------+------------+           73                      87           +----------+--------+--------+--------+------------+ +---------+--------+--+--------+-+---------+ VertebralPSV cm/s27EDV cm/s7Antegrade +---------+--------+--+--------+-+---------+  ABI Findings: +--------+------------------+-----+---------+--------+ Right   Rt Pressure (mmHg)IndexWaveform Comment  +--------+------------------+-----+---------+--------+ Brachial88                     triphasic         +--------+------------------+-----+---------+--------+ PTA     99                1.12 biphasic          +--------+------------------+-----+---------+--------+ DP      100               1.14 biphasic          +--------+------------------+-----+---------+--------+ +--------+------------------+-----+---------+-------+ Left    Lt Pressure (mmHg)IndexWaveform Comment +--------+------------------+-----+---------+-------+ [id number]                     triphasic        +--------+------------------+-----+---------+-------+ PTA     71                0.81 biphasic         +--------+------------------+-----+---------+-------+ DP      56                0.64 biphasic         +--------+------------------+-----+---------+-------+ +-------+---------------+----------------+ ABI/TBIToday's ABI/TBIPrevious ABI/TBI +-------+---------------+----------------+ Right  1.14                            +-------+---------------+----------------+  Left   0.81                            +-------+---------------+----------------+  Right Doppler Findings: +-----------+--------+-----+---------+-----------------------------------------+ Site        PressureIndexDoppler  Comments                                  +-----------+--------+-----+---------+-----------------------------------------+ Brachial   88           triphasic                                          +-----------+--------+-----+---------+-----------------------------------------+ Radial                  triphasic                                          +-----------+--------+-----+---------+-----------------------------------------+ Ulnar                   triphasic                                          +-----------+--------+-----+---------+-----------------------------------------+ Palmar Arch                      Palmar waveforms are obliterated with                                      radial and ulnar compression.             +-----------+--------+-----+---------+-----------------------------------------+  Left Doppler Findings: +-----------+--------+-----+---------+-----------------------------------------+ Site       PressureIndexDoppler  Comments                                  +-----------+--------+-----+---------+-----------------------------------------+ Brachial   87           triphasic                                          +-----------+--------+-----+---------+-----------------------------------------+ Radial                  triphasic                                          +-----------+--------+-----+---------+-----------------------------------------+ Ulnar                   triphasic                                          +-----------+--------+-----+---------+-----------------------------------------+ Palmar Arch                      Palmar waveforms are obliterated  with                                      radial and ulnar compression.             +-----------+--------+-----+---------+-----------------------------------------+  Summary: Right Carotid: Velocities in the right ICA are consistent with a  1-39% stenosis. Left Carotid: Velocities in the left ICA are consistent with a 1-39% stenosis. Vertebrals: Bilateral vertebral arteries demonstrate antegrade flow. Right ABI: Resting right ankle-brachial index is within normal range. No evidence of significant right lower extremity arterial disease. Left ABI: Resting left ankle-brachial index indicates mild left lower extremity arterial disease.     Preliminary    Ct Angio Abd/pel W/ And/or W/o  Result Date: 03/29/2018 CLINICAL DATA:  58 year old male with history of severe aortic stenosis. Preprocedural study prior to potential transcatheter aortic valve replacement (TAVR). EXAM: CT ANGIOGRAPHY CHEST, ABDOMEN AND PELVIS TECHNIQUE: Multidetector CT imaging through the chest, abdomen and pelvis was performed using the standard protocol during bolus administration of intravenous contrast. Multiplanar reconstructed images and MIPs were obtained and reviewed to evaluate the vascular anatomy. CONTRAST:  170mL ISOVUE-370 IOPAMIDOL (ISOVUE-370) INJECTION 76% COMPARISON:  CT the abdomen and pelvis 12/14/2017. Chest CT 08/17/2016. FINDINGS: CTA CHEST FINDINGS Cardiovascular: Heart size is mildly enlarged. There is no significant pericardial fluid, thickening or pericardial calcification. There is aortic atherosclerosis, as well as atherosclerosis of the great vessels of the mediastinum and the coronary arteries, including calcified atherosclerotic plaque in the left main, left anterior descending, left circumflex and right coronary arteries. Severe thickening calcification of the aortic valve. Mediastinum/Lymph Nodes: No pathologically enlarged mediastinal or hilar lymph nodes. Esophagus is unremarkable in appearance. No axillary lymphadenopathy. Lungs/Pleura: No suspicious appearing pulmonary nodules or masses are noted. No acute consolidative airspace disease. Small bilateral pleural effusions lying dependently (right greater than left). Musculoskeletal/Soft Tissues:  There are no aggressive appearing lytic or blastic lesions noted in the visualized portions of the skeleton. CTA ABDOMEN AND PELVIS FINDINGS Hepatobiliary: No suspicious cystic or solid hepatic lesions. No intra or extrahepatic biliary ductal dilatation. Gallbladder is normal in appearance. Pancreas: No pancreatic mass. No pancreatic ductal dilatation. No pancreatic or peripancreatic fluid or inflammatory changes. Spleen: Visualized portions are unremarkable. Adrenals/Urinary Tract: 1.7 cm low-attenuation lesion in the anterior aspect of the interpolar region of the right kidney, compatible with a simple cyst. There are 2 intermediate to high attenuation lesions in the left kidney, measuring up to 2.2 cm in the lower pole of the left kidney, stable compared to the prior examination 12/14/2017, statistically likely to represent a proteinaceous/hemorrhagic cysts. Other subcentimeter low-attenuation lesions in the kidneys, too small to characterize, but statistically likely to represent tiny cysts. Bilateral adrenal glands are normal in appearance. No hydroureteronephrosis. Urinary bladder is normal in appearance. Stomach/Bowel: Normal appearance of the stomach. No pathologic dilatation of small bowel or colon. Normal appendix. Vascular/Lymphatic: Aortic atherosclerosis, without evidence of aneurysm or dissection in the abdominal or pelvic vasculature. Vascular findings and measurements pertinent to potential TAVR procedure, as detailed below. No lymphadenopathy noted in the abdomen or pelvis. Reproductive: Prostate gland and seminal vesicles are unremarkable in appearance. Other: No significant volume of ascites.  No pneumoperitoneum. Musculoskeletal: There are no aggressive appearing lytic or blastic lesions noted in the visualized portions of the skeleton. VASCULAR MEASUREMENTS PERTINENT TO TAVR: AORTA: Minimal Aortic Diameter-17 x 16 mm Severity of Aortic Calcification-mild-to-moderate RIGHT PELVIS: Right Common  Iliac Artery - Minimal Diameter-10.1 x 8.6 mm Tortuosity-mild Calcification-moderate Right External Iliac Artery - Minimal Diameter-7.4 x 5.9 mm Tortuosity - mild Calcification-none Right Common Femoral Artery - Minimal Diameter-7.8 x 6.6 mm Tortuosity - mild Calcification-mild LEFT PELVIS: Left Common Iliac Artery - Minimal Diameter-10.2 x 8.4 mm Tortuosity-severe Calcification-mild Left External Iliac Artery - Minimal Diameter-8.0 x 7.7 mm Tortuosity-moderate Calcification-mild Left Common Femoral Artery - Minimal Diameter-7.2 x 8.0 mm Tortuosity-mild Calcification-mild Review of the MIP images confirms the above findings. IMPRESSION: 1. Vascular findings and measurements pertinent to potential TAVR procedure, as detailed above. 2. Severe thickening calcification of the aortic valve, compatible with the reported clinical history of severe aortic stenosis. 3. Aortic atherosclerosis, in addition to left main and 3 vessel coronary artery disease. Please note that although the presence of coronary artery calcium documents the presence of coronary artery disease, the severity of this disease and any potential stenosis cannot be assessed on this non-gated CT examination. Assessment for potential risk factor modification, dietary therapy or pharmacologic therapy may be warranted, if clinically indicated. 4. Small bilateral pleural effusions (right greater than left). 5. Additional incidental findings, as above. Electronically Signed   By: Vinnie Langton M.D.   On: 03/29/2018 15:45    Discharge Medications   Allergies as of 03/30/2018   No Known Allergies     Medication List    STOP taking these medications   furosemide 80 MG tablet Commonly known as:  LASIX   lisinopril 20 MG tablet Commonly known as:  PRINIVIL,ZESTRIL     TAKE these medications   acetaminophen 500 MG tablet Commonly known as:  TYLENOL Take 1,000 mg by mouth every 6 (six) hours as needed for mild pain or headache.   ALPRAZolam  0.5 MG tablet Commonly known as:  XANAX Take 1 tablet (0.5 mg total) by mouth at bedtime as needed for anxiety. What changed:    how much to take  when to take this   aspirin 81 MG chewable tablet Chew 81 mg by mouth daily.   busPIRone 10 MG tablet Commonly known as:  BUSPAR Take 1 tablet (10 mg total) by mouth 3 (three) times daily. What changed:  when to take this   digoxin 0.125 MG tablet Commonly known as:  LANOXIN Take 1 tablet (0.125 mg total) by mouth daily. Start taking on:  March 31, 2018   fluticasone furoate-vilanterol 100-25 MCG/INH Aepb Commonly known as:  BREO ELLIPTA Inhale 1 puff into the lungs daily. Rinse mouth with water after each use   metoprolol succinate 50 MG 24 hr tablet Commonly known as:  TOPROL-XL Take 1 tablet (50 mg total) by mouth 2 (two) times daily. Take with or immediately following a meal. What changed:    medication strength  how much to take   potassium chloride SA 20 MEQ tablet Commonly known as:  K-DUR,KLOR-CON Take 1 tablet (20 mEq total) by mouth 2 (two) times daily.   rosuvastatin 10 MG tablet Commonly known as:  CRESTOR Take 1 tablet (10 mg total) by mouth daily at 6 PM.   sacubitril-valsartan 24-26 MG Commonly known as:  ENTRESTO Take 1 tablet by mouth 2 (two) times daily.   sertraline 100 MG tablet Commonly known as:  ZOLOFT Take 1 tablet (100 mg total) by mouth daily. What changed:  when to take this   spironolactone 25 MG tablet Commonly known as:  ALDACTONE Take 0.5 tablets (12.5 mg total) by mouth daily. Start taking on:  March 31, 2018  torsemide 20 MG tablet Commonly known as:  DEMADEX Take 2 tablets (40 mg total) by mouth daily. Start taking on:  March 31, 2018   VITAMIN D PO Take 4 capsules by mouth daily.       Disposition   The patient will be discharged in stable condition to home. Discharge Instructions    (HEART FAILURE PATIENTS) Call MD:  Anytime you have any of the following  symptoms: 1) 3 pound weight gain in 24 hours or 5 pounds in 1 week 2) shortness of breath, with or without a dry hacking cough 3) swelling in the hands, feet or stomach 4) if you have to sleep on extra pillows at night in order to breathe.   Complete by:  As directed    Diet - low sodium heart healthy   Complete by:  As directed    Heart Failure patients record your daily weight using the same scale at the same time of day   Complete by:  As directed    Increase activity slowly   Complete by:  As directed      Follow-up Information    [facility] up on 04/10/2018.   Specialty:  Cardiology Why:  Heart Failure Follow up 04/10/2018 @ 2:30pm -Parking in ER lot (enter under blue awning to left of ER), or underneath [place] in the [place] on [address] (garage code:[id number] elevator to 1st floor).  -Take all am meds and bring all med bottles  Contact information: [address] [id number] [place] [phone]            Duration of Discharge Encounter: Greater than 35 minutes   Signed, Darrick Grinder NP-C  03/30/2018, 12:39 PM

## 2018-03-30 NOTE — Progress Notes (Signed)
Patient ID: Tyler Berger, male   DOB: [birth year], 59 y.o.   MRN: [id number]     Advanced Heart Failure Rounding Note  PCP-Cardiologist: No primary care provider on file.   Subjective:    Patient diuresed well again yesterday, weight down 3 more lbs.  Breathing better.  Had TAVR CT scans yesterday.   Echo: EF 20-25%, mildly dilated LV, mild LVH, mild MR, mildly decreased RV function, severe aortic stenosis (bicuspid valve by history but hard to tell).   RHC/LHC Coronary Findings  Diagnostic  Dominance: Right  Left Main  No significant disease.  Left Anterior Descending  Small D1 with 80% proximal stenosis. Moderate D2 with 50% proximal stenosis. Luminal irregularities in the LAD.  Left Circumflex  50% mid LCx stenosis.  Right Coronary Artery  50% distal RCA stenosis.  Intervention   No interventions have been documented.  Right Heart   Right Heart Pressures RHC Procedural Findings: Hemodynamics (mmHg) RA mean 12 RV 64/17 PA 70/32, mean 47 PCWP mean 30  Oxygen saturations: PA 61% AO 97%  Cardiac Output (Fick) 4.05  Cardiac Index (Fick) 1.72 PVR 4.2 WU     Objective:   Weight Range: 102.2 kg Body mass index is 28.17 kg/m.   Vital Signs:   Temp:  [97.4 F (36.3 C)-98 F (36.7 C)] 97.7 F (36.5 C) (02/21 0355) Pulse Rate:  [70-81] 77 (02/21 0355) Resp:  [18-22] 18 (02/21 0355) BP: (83-110)/(54-96) 95/60 (02/21 0355) SpO2:  [95 %-100 %] 98 % (02/21 0355) Weight:  [102.2 kg] 102.2 kg (02/21 0350) Last BM Date: 03/29/18  Weight change: Filed Weights   03/28/18 0634 03/29/18 0520 03/30/18 0350  Weight: 107 kg 103.6 kg 102.2 kg    Intake/Output:   Intake/Output Summary (Last 24 hours) at 03/30/2018 0816 Last data filed at 03/30/2018 0600 Gross per 24 hour  Intake 1303.5 ml  Output 2200 ml  Net -896.5 ml      Physical Exam    General: NAD Neck: No JVD, no thyromegaly or thyroid nodule.  Lungs: Clear to auscultation bilaterally with normal  respiratory effort. CV: Nondisplaced PMI.  Heart regular S1/S2, no S3/S4, 3/6 SEM RUSB (cannot hear S2).  No peripheral edema.   Abdomen: Soft, nontender, no hepatosplenomegaly, no distention.  Skin: Intact without lesions or rashes.  Neurologic: Alert and oriented x 3.  Psych: Normal affect. Extremities: No clubbing or cyanosis.  HEENT: Normal.    Telemetry   NSR with LBBB, PVCs (personally reviewed).   Labs    CBC Recent Labs    03/28/18 1859 03/30/18 0503  WBC 5.4 5.4  NEUTROABS 3.6 3.7  HGB 14.9 15.0  HCT 45.3 45.7  MCV 88.8 88.2  PLT PLATELET CLUMPS NOTED ON SMEAR, UNABLE TO ESTIMATE 409*   Basic Metabolic Panel Recent Labs    03/29/18 0337 03/30/18 0503  NA 138 139  K 3.7 4.0  CL 105 106  CO2 20* 21*  GLUCOSE 150* 120*  BUN 16 16  CREATININE 0.90 0.77  CALCIUM 9.0 9.2   Liver Function Tests Recent Labs    03/28/18 1859  AST 17  ALT 16  ALKPHOS 52  BILITOT 1.5*  PROT 6.7  ALBUMIN 3.8   No results for input(s): LIPASE, AMYLASE in the last 72 hours. Cardiac Enzymes No results for input(s): CKTOTAL, CKMB, CKMBINDEX, TROPONINI in the last 72 hours.  BNP: BNP (last 3 results) Recent Labs    03/28/18 1859  BNP 1,155.1*    ProBNP (last 3 results) No  results for input(s): PROBNP in the last 8760 hours.   D-Dimer No results for input(s): DDIMER in the last 72 hours. Hemoglobin A1C No results for input(s): HGBA1C in the last 72 hours. Fasting Lipid Panel Recent Labs    03/28/18 1859  CHOL 140  HDL 21*  LDLCALC 104*  TRIG 75  CHOLHDL 6.7   Thyroid Function Tests Recent Labs    03/28/18 1859  TSH 1.643    Other results:   Imaging    Ct Coronary Morph W/cta Cor W/score W/ca W/cm &/or Wo/cm  Addendum Date: 03/29/2018   ADDENDUM REPORT: 03/29/2018 16:46 CLINICAL DATA:  Aortic stenosis EXAM: Cardiac TAVR CT TECHNIQUE: The patient was scanned on a Siemens Force 563 slice scanner. A 120 kV retrospective scan was triggered in the  descending thoracic aorta at 111 HU's. Gantry rotation speed was 270 msecs and collimation was .9 mm. No beta blockade or nitro were given. The 3D data set was reconstructed in 5% intervals of the R-R cycle. Systolic and diastolic phases were analyzed on a dedicated work station using MPR, MIP and VRT modes. The patient received 80 cc of contrast. FINDINGS: Aortic Valve: Bicuspid with heavy calcium fusion of right and left cusps. There is large nodular area of calcification at base of both leaflets Aorta: Dilated root normal arch vessels no coarctation Sinotubular Junction: 34 mm Ascending Thoracic Aorta: 43 mm Aortic Arch: 28 mm Descending Thoracic Aorta: 23 mm Sinus of Valsalva Measurements: Non-coronary: 35.7 mm Right - coronary: 38.2 mm Left - coronary: 37.4 mm Coronary Artery Height above Annulus: Left Main: 13.8 mm above annulus Right Coronary: 17 mm above annulus Virtual Basal Annulus Measurements: Maximum/Minimum Diameter: 30 mm x 36 mm Perimeter: 113 mm Area: 946 mm2 Coronary Arteries: Sufficient height above annulus for deployment but concern for LM as fused cusp heavily calcified and broad would appear to be at risk for occlusion Optimum Fluoroscopic Angle for Delivery: LAO 14 mm CAudal 14 mm IMPRESSION: 1. Heavily calcified bicuspid valve with annular area of 946 mm2 would appear to be too large for Medtronic or Sapien 3 valve. The dense opposed nodular calcium in the annulus also Would appear to increase risk of peri valvular regurgitation 2. Coronary arteries sufficient height above annulus for deployment by traditional criteria but concern for broad based fused R/L leaflet being heavily calcified and potential obstruction of LM 3. Optimum angiographic angle for deployment LAO 14 Caudal 14 degrees 4.  Dilated aortic root 4.3 cm Jenkins Rouge Electronically Signed   By: Jenkins Rouge M.D.   On: 03/29/2018 16:46   Result Date: 03/29/2018 EXAM: OVER-READ INTERPRETATION  CT CHEST The following report is  an over-read performed by radiologist Dr. Vinnie Langton of [facility], [place] on 03/29/2018. This over-read does not include interpretation of cardiac or coronary anatomy or pathology. The coronary calcium score/coronary CTA interpretation by the cardiologist is attached. COMPARISON:  Chest CT 08/18/2006. FINDINGS: Extracardiac findings will be described separately under dictation for contemporaneously obtained CTA chest, abdomen and pelvis. IMPRESSION: Please see separate dictation for contemporaneously obtained CTA chest, abdomen and pelvis dated 03/29/2018 for full description of relevant extracardiac findings. Electronically Signed: By: Vinnie Langton M.D. On: 03/29/2018 14:37   Ct Angio Chest Aorta W/cm &/or Wo/cm  Result Date: 03/29/2018 CLINICAL DATA:  58 year old male with history of severe aortic stenosis. Preprocedural study prior to potential transcatheter aortic valve replacement (TAVR). EXAM: CT ANGIOGRAPHY CHEST, ABDOMEN AND PELVIS TECHNIQUE: Multidetector CT imaging through the chest, abdomen and pelvis  was performed using the standard protocol during bolus administration of intravenous contrast. Multiplanar reconstructed images and MIPs were obtained and reviewed to evaluate the vascular anatomy. CONTRAST:  158mL ISOVUE-370 IOPAMIDOL (ISOVUE-370) INJECTION 76% COMPARISON:  CT the abdomen and pelvis 12/14/2017. Chest CT 08/17/2016. FINDINGS: CTA CHEST FINDINGS Cardiovascular: Heart size is mildly enlarged. There is no significant pericardial fluid, thickening or pericardial calcification. There is aortic atherosclerosis, as well as atherosclerosis of the great vessels of the mediastinum and the coronary arteries, including calcified atherosclerotic plaque in the left main, left anterior descending, left circumflex and right coronary arteries. Severe thickening calcification of the aortic valve. Mediastinum/Lymph Nodes: No pathologically enlarged mediastinal or hilar lymph nodes. Esophagus  is unremarkable in appearance. No axillary lymphadenopathy. Lungs/Pleura: No suspicious appearing pulmonary nodules or masses are noted. No acute consolidative airspace disease. Small bilateral pleural effusions lying dependently (right greater than left). Musculoskeletal/Soft Tissues: There are no aggressive appearing lytic or blastic lesions noted in the visualized portions of the skeleton. CTA ABDOMEN AND PELVIS FINDINGS Hepatobiliary: No suspicious cystic or solid hepatic lesions. No intra or extrahepatic biliary ductal dilatation. Gallbladder is normal in appearance. Pancreas: No pancreatic mass. No pancreatic ductal dilatation. No pancreatic or peripancreatic fluid or inflammatory changes. Spleen: Visualized portions are unremarkable. Adrenals/Urinary Tract: 1.7 cm low-attenuation lesion in the anterior aspect of the interpolar region of the right kidney, compatible with a simple cyst. There are 2 intermediate to high attenuation lesions in the left kidney, measuring up to 2.2 cm in the lower pole of the left kidney, stable compared to the prior examination 12/14/2017, statistically likely to represent a proteinaceous/hemorrhagic cysts. Other subcentimeter low-attenuation lesions in the kidneys, too small to characterize, but statistically likely to represent tiny cysts. Bilateral adrenal glands are normal in appearance. No hydroureteronephrosis. Urinary bladder is normal in appearance. Stomach/Bowel: Normal appearance of the stomach. No pathologic dilatation of small bowel or colon. Normal appendix. Vascular/Lymphatic: Aortic atherosclerosis, without evidence of aneurysm or dissection in the abdominal or pelvic vasculature. Vascular findings and measurements pertinent to potential TAVR procedure, as detailed below. No lymphadenopathy noted in the abdomen or pelvis. Reproductive: Prostate gland and seminal vesicles are unremarkable in appearance. Other: No significant volume of ascites.  No pneumoperitoneum.  Musculoskeletal: There are no aggressive appearing lytic or blastic lesions noted in the visualized portions of the skeleton. VASCULAR MEASUREMENTS PERTINENT TO TAVR: AORTA: Minimal Aortic Diameter-17 x 16 mm Severity of Aortic Calcification-mild-to-moderate RIGHT PELVIS: Right Common Iliac Artery - Minimal Diameter-10.1 x 8.6 mm Tortuosity-mild Calcification-moderate Right External Iliac Artery - Minimal Diameter-7.4 x 5.9 mm Tortuosity - mild Calcification-none Right Common Femoral Artery - Minimal Diameter-7.8 x 6.6 mm Tortuosity - mild Calcification-mild LEFT PELVIS: Left Common Iliac Artery - Minimal Diameter-10.2 x 8.4 mm Tortuosity-severe Calcification-mild Left External Iliac Artery - Minimal Diameter-8.0 x 7.7 mm Tortuosity-moderate Calcification-mild Left Common Femoral Artery - Minimal Diameter-7.2 x 8.0 mm Tortuosity-mild Calcification-mild Review of the MIP images confirms the above findings. IMPRESSION: 1. Vascular findings and measurements pertinent to potential TAVR procedure, as detailed above. 2. Severe thickening calcification of the aortic valve, compatible with the reported clinical history of severe aortic stenosis. 3. Aortic atherosclerosis, in addition to left main and 3 vessel coronary artery disease. Please note that although the presence of coronary artery calcium documents the presence of coronary artery disease, the severity of this disease and any potential stenosis cannot be assessed on this non-gated CT examination. Assessment for potential risk factor modification, dietary therapy or pharmacologic therapy may be warranted,  if clinically indicated. 4. Small bilateral pleural effusions (right greater than left). 5. Additional incidental findings, as above. Electronically Signed   By: Vinnie Langton M.D.   On: 03/29/2018 15:45   Ct Angio Abd/pel W/ And/or W/o  Result Date: 03/29/2018 CLINICAL DATA:  58 year old male with history of severe aortic stenosis. Preprocedural study prior  to potential transcatheter aortic valve replacement (TAVR). EXAM: CT ANGIOGRAPHY CHEST, ABDOMEN AND PELVIS TECHNIQUE: Multidetector CT imaging through the chest, abdomen and pelvis was performed using the standard protocol during bolus administration of intravenous contrast. Multiplanar reconstructed images and MIPs were obtained and reviewed to evaluate the vascular anatomy. CONTRAST:  151mL ISOVUE-370 IOPAMIDOL (ISOVUE-370) INJECTION 76% COMPARISON:  CT the abdomen and pelvis 12/14/2017. Chest CT 08/17/2016. FINDINGS: CTA CHEST FINDINGS Cardiovascular: Heart size is mildly enlarged. There is no significant pericardial fluid, thickening or pericardial calcification. There is aortic atherosclerosis, as well as atherosclerosis of the great vessels of the mediastinum and the coronary arteries, including calcified atherosclerotic plaque in the left main, left anterior descending, left circumflex and right coronary arteries. Severe thickening calcification of the aortic valve. Mediastinum/Lymph Nodes: No pathologically enlarged mediastinal or hilar lymph nodes. Esophagus is unremarkable in appearance. No axillary lymphadenopathy. Lungs/Pleura: No suspicious appearing pulmonary nodules or masses are noted. No acute consolidative airspace disease. Small bilateral pleural effusions lying dependently (right greater than left). Musculoskeletal/Soft Tissues: There are no aggressive appearing lytic or blastic lesions noted in the visualized portions of the skeleton. CTA ABDOMEN AND PELVIS FINDINGS Hepatobiliary: No suspicious cystic or solid hepatic lesions. No intra or extrahepatic biliary ductal dilatation. Gallbladder is normal in appearance. Pancreas: No pancreatic mass. No pancreatic ductal dilatation. No pancreatic or peripancreatic fluid or inflammatory changes. Spleen: Visualized portions are unremarkable. Adrenals/Urinary Tract: 1.7 cm low-attenuation lesion in the anterior aspect of the interpolar region of the right  kidney, compatible with a simple cyst. There are 2 intermediate to high attenuation lesions in the left kidney, measuring up to 2.2 cm in the lower pole of the left kidney, stable compared to the prior examination 12/14/2017, statistically likely to represent a proteinaceous/hemorrhagic cysts. Other subcentimeter low-attenuation lesions in the kidneys, too small to characterize, but statistically likely to represent tiny cysts. Bilateral adrenal glands are normal in appearance. No hydroureteronephrosis. Urinary bladder is normal in appearance. Stomach/Bowel: Normal appearance of the stomach. No pathologic dilatation of small bowel or colon. Normal appendix. Vascular/Lymphatic: Aortic atherosclerosis, without evidence of aneurysm or dissection in the abdominal or pelvic vasculature. Vascular findings and measurements pertinent to potential TAVR procedure, as detailed below. No lymphadenopathy noted in the abdomen or pelvis. Reproductive: Prostate gland and seminal vesicles are unremarkable in appearance. Other: No significant volume of ascites.  No pneumoperitoneum. Musculoskeletal: There are no aggressive appearing lytic or blastic lesions noted in the visualized portions of the skeleton. VASCULAR MEASUREMENTS PERTINENT TO TAVR: AORTA: Minimal Aortic Diameter-17 x 16 mm Severity of Aortic Calcification-mild-to-moderate RIGHT PELVIS: Right Common Iliac Artery - Minimal Diameter-10.1 x 8.6 mm Tortuosity-mild Calcification-moderate Right External Iliac Artery - Minimal Diameter-7.4 x 5.9 mm Tortuosity - mild Calcification-none Right Common Femoral Artery - Minimal Diameter-7.8 x 6.6 mm Tortuosity - mild Calcification-mild LEFT PELVIS: Left Common Iliac Artery - Minimal Diameter-10.2 x 8.4 mm Tortuosity-severe Calcification-mild Left External Iliac Artery - Minimal Diameter-8.0 x 7.7 mm Tortuosity-moderate Calcification-mild Left Common Femoral Artery - Minimal Diameter-7.2 x 8.0 mm Tortuosity-mild Calcification-mild  Review of the MIP images confirms the above findings. IMPRESSION: 1. Vascular findings and measurements pertinent to potential TAVR procedure,  as detailed above. 2. Severe thickening calcification of the aortic valve, compatible with the reported clinical history of severe aortic stenosis. 3. Aortic atherosclerosis, in addition to left main and 3 vessel coronary artery disease. Please note that although the presence of coronary artery calcium documents the presence of coronary artery disease, the severity of this disease and any potential stenosis cannot be assessed on this non-gated CT examination. Assessment for potential risk factor modification, dietary therapy or pharmacologic therapy may be warranted, if clinically indicated. 4. Small bilateral pleural effusions (right greater than left). 5. Additional incidental findings, as above. Electronically Signed   By: Vinnie Langton M.D.   On: 03/29/2018 15:45     Medications:     Scheduled Medications: . ALPRAZolam  0.25 mg Oral QHS  . aspirin  81 mg Oral Daily  . busPIRone  10 mg Oral QHS  . digoxin  0.125 mg Oral Daily  . heparin  5,000 Units Subcutaneous Q8H  . metoprolol succinate  50 mg Oral BID  . potassium chloride  40 mEq Oral Daily  . rosuvastatin  10 mg Oral q1800  . sacubitril-valsartan  1 tablet Oral BID  . sertraline  100 mg Oral QHS  . sodium chloride flush  3 mL Intravenous Q12H  . sodium chloride flush  3 mL Intravenous Q12H  . sodium chloride flush  3 mL Intravenous Q12H  . spironolactone  12.5 mg Oral Daily  . torsemide  40 mg Oral Daily    Infusions: . sodium chloride    . sodium chloride 10 mL/hr at 03/28/18 0739  . sodium chloride    . sodium chloride      PRN Medications: sodium chloride, sodium chloride, sodium chloride, acetaminophen, ondansetron (ZOFRAN) IV, sodium chloride flush, sodium chloride flush, sodium chloride flush   Assessment/Plan   1. Acute on chronic systolic CHF: He has a history of  nonischemic cardiomyopathy that recovered in the past back to normal EF.  At the time, he had moderate AS with suspected bicuspid aortic valve.  He drinks moderate ETOH but denies heavy drinking.  He has recurrent nonischemic cardiomyopathy, coronary angiography does not show enough CAD to explain low EF.  Echo with EF 20-25% by echo this admission.  I think his aortic stenosis is now severe, but given prior history, I am not sure that cardiomyopathy can be explained fully by AS.  He was markedly volume overloaded by RHC and had NYHA class IIIb symptoms. Cardiac output was low. He diuresed well again yesterday, now appears euvolemic on exam.  Breathing better.  - Stop IV Lasix, start torsemide 40 mg daily.  - He has been switched from lisinopril to Entresto 24/26 bid.  - Continue lower dose Toprol XL 50 mg bid.   - Continue spironolactone 12.5 mg daily.  - Added digoxin 0.125 mg daily with low output on RHC.  - He has LBBB.  If EF remains low, will need CRT-D.  2. Aortic stenosis.  Bicuspid aortic valve with severe AS.   - Structural heart team has seen and he had TAVR scans yesterday.  He has a large valve area, and appears too large for Sapien 3 or Medtronic TAVR valve, suspect surgery will be his best option.  Will see if surgeon can see him while in the hospital today, if not will let him go and with followup at [facility].  3. ETOH: He drinks moderately but does not seem heavy enough to have caused cardiomyopathy.  4. Smoking: Encouraged him to  quit.  5. CAD: Moderate but no target for intervention and does not explain cardiomyopathy.  - Continue ASA 81 daily.  - Added Crestor 10 mg daily.  6. Disposition: Home today after seen by surgeon (if not, needs outpatient [facility] appt).  Followup in CHF clinic in 7-10 days.  Meds for home: torsemide 40 mg daily, KCl 20 daily, Crestor 10 daily, ASA 81 daily, Entresto 24/26 bid, Toprol XL 50 bid, spironolactone 12.5 daily, digoxin 0.125 daily.    Length of Stay:  2  Loralie Champagne, MD  03/30/2018, 8:16 AM  Advanced Heart Failure Team Pager [phone] (M-F; 7a - 4p)  Please contact [facility] for night-coverage after hours (4p -7a ) and weekends on [email or web address]

## 2018-03-30 NOTE — Progress Notes (Signed)
Pre-CABG testing has been completed. Preliminary results can be found in CV Proc through chart review.   03/30/18 12:08 PM Tyler Berger [facility]

## 2018-04-02 ENCOUNTER — Other Ambulatory Visit: Payer: Self-pay

## 2018-04-02 ENCOUNTER — Encounter: Payer: Self-pay | Admitting: Thoracic Surgery (Cardiothoracic Vascular Surgery)

## 2018-04-02 ENCOUNTER — Other Ambulatory Visit: Payer: Self-pay | Admitting: *Deleted

## 2018-04-02 ENCOUNTER — Encounter: Payer: Self-pay | Admitting: *Deleted

## 2018-04-02 ENCOUNTER — Institutional Professional Consult (permissible substitution): Payer: Managed Care, Other (non HMO) | Admitting: Thoracic Surgery (Cardiothoracic Vascular Surgery)

## 2018-04-02 VITALS — BP 80/60 | HR 78 | Resp 16 | Ht 75.0 in | Wt 221.0 lb

## 2018-04-02 DIAGNOSIS — I7121 Aneurysm of the ascending aorta, without rupture: Secondary | ICD-10-CM

## 2018-04-02 DIAGNOSIS — Q231 Congenital insufficiency of aortic valve: Secondary | ICD-10-CM | POA: Diagnosis not present

## 2018-04-02 DIAGNOSIS — I350 Nonrheumatic aortic (valve) stenosis: Secondary | ICD-10-CM

## 2018-04-02 DIAGNOSIS — I5023 Acute on chronic systolic (congestive) heart failure: Secondary | ICD-10-CM

## 2018-04-02 DIAGNOSIS — I719 Aortic aneurysm of unspecified site, without rupture: Secondary | ICD-10-CM

## 2018-04-02 DIAGNOSIS — I77810 Thoracic aortic ectasia: Secondary | ICD-10-CM | POA: Insufficient documentation

## 2018-04-02 NOTE — Progress Notes (Signed)
HEART AND [place] SURGERY CONSULTATION REPORT  Referring Provider is Larey Dresser, MD Primary Cardiologist is Minus Breeding, MD PCP is Unk Pinto, MD  Chief Complaint  Patient presents with  . Aortic Stenosis    eval for AVR...ECHO 03/02/18. 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  Patient's cardiac history dates back to 2004 when he was hospitalized with symptoms of congestive heart failure.  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  He was noted to have a bicuspid aortic valve with what was felt to be mild to moderate aortic stenosis at that time.  He was seen in follow-up in 2008 at which time reportedly left ventricular systolic function had recovered with ejection fraction estimated 60 to 65%.  There was moderate aortic stenosis with mean transvalvular gradient reported 23 mmHg at that time.  Stress perfusion study performed at that time was felt to be low risk.  He was lost to medical follow-up until recently.  Over the past 4 to 6 months the patient has developed progressive symptoms of exertional shortness of breath and fatigue.  In early January he was evaluated in the emergency department with an episode of supraventricular tachycardia.  He was treated and released and has subsequently been followed  carefully by Dr. Percival Spanish.  Transthoracic echocardiogram performed March 02, 2018 revealed severe left ventricular systolic dysfunction with ejection fraction estimated 25 to 30%.  There was bicuspid aortic valve with severe low gradient, low ejection fraction aortic stenosis.  Peak velocity across the aortic valve was measured 3.6 m/s corresponding to mean transvalvular gradient estimated 33 mmHg.  The DVI was 0.2 with aortic valve area calculated 0.68 cm.  He was scheduled for elective diagnostic cardiac catheterization which was performed March 28, 2018.  The patient was found to have mild nonobstructive coronary artery disease.  The aortic valve was not crossed.  There was moderate to severe pulmonary hypertension with PA pressures measured 70/32 with pulmonary capillary wedge pressure of 30, mixed venous oxygen saturation 61%, and cardiac index only 1.72 L/min/m.  The patient was hospitalized for acute exacerbation of chronic systolic congestive heart failure.  He improved from a symptomatic standpoint with medical therapy for heart failure.  He was seen in consultation by Dr. Angelena Form from the multidisciplinary heart valve team.  CT angiography was performed and the patient has been referred for elective surgical consultation.  Patient is married and lives locally in [place] with his partner and husband.  He has 2 adult children, 1 of whom accompanies him for his office consultation today.  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  Although he does not exercise on a regular basis he has remained reasonably active physically and functionally independent until recently.  He reports that his only functional limitation is that of progressive exertional shortness of breath and fatigue, all of which seem to begin last fall.  Symptoms slowly progressed and have been associated with a persistent  cough.  He had 1 syncopal episode last fall for which he did not seek medical  attention.  He has never had any chest pain or chest tightness either with activity or at rest.  He has had some lower extremity edema.  He has not experienced PND or orthopnea.  Symptoms have improved since his recent hospitalization.  Since hospital discharge weight has been stable.  He still gets short of breath with activity but not quite as bad as it had been previously.  He states that he quit smoking approximately 1 week ago.  He drinks alcohol on a daily basis.    Past Medical History:  Diagnosis Date  . Anxiety   . Aortic stenosis   . CAD (coronary artery disease)   . Dilated aortic root ([place])   . Hypertension   . Nonischemic cardiomyopathy ([place])   . SVT (supraventricular tachycardia) ([place])   . Viral myocarditis 2004  . Vitamin D deficiency     Past Surgical History:  Procedure Laterality Date  . HERNIA REPAIR Left 2010  . ORCHIECTOMY Left 1982  . RIGHT HEART CATH AND CORONARY ANGIOGRAPHY N/A 03/28/2018   Procedure: RIGHT HEART CATH AND CORONARY ANGIOGRAPHY;  Surgeon: Larey Dresser, MD;  Location: [facility];  Service: Cardiovascular;  Laterality: N/A;  . VARICOSE VEIN SURGERY Left 05/1998    Family History  Problem Relation Age of Onset  . Diabetes Mother   . Hypertension Mother   . Cancer Mother        breast  . Alzheimer's disease Mother   . Hypertension Father   . Cancer Father        head and neck  . Hypertension Sister     Social History   Socioeconomic History  . Marital status: Married    Spouse name: Not on file  . Number of children: 2  . Years of education: Not on file  . Highest education level: Not on file  Occupational History  . 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 Financial resource strain: Not on file  . Food insecurity:    Worry: Not on file    Inability: Not on file  . Transportation needs:    Medical: Not on file    Non-medical: Not on file  Tobacco Use  . Smoking status: Current Every Day  Smoker    Packs/day: 0.75    Years: 30.00    Pack years: 22.50    Types: Cigarettes  . Smokeless tobacco: Never Used  Substance and Sexual Activity  . Alcohol use: Yes    Alcohol/week: 2.0 standard drinks    Types: 2 Glasses of wine per week  . Drug use: Never  . Sexual activity: Not on file  Lifestyle  . Physical activity:    Days per week: Not on file    Minutes per session: Not on file  . Stress: Not on file  Relationships  . Social connections:    Talks on phone: Not on file    Gets together: Not on file    Attends religious service: Not on file    Active member of club or organization: Not on file    Attends meetings of clubs or organizations: Not on file    Relationship status: Not on file  . Intimate partner violence:    Fear of current or ex partner: Not on file    Emotionally abused: Not on file    Physically abused: Not on file  Forced sexual activity: Not on file  Other Topics Concern  . Not on file  Social History Narrative   Married, med Designer, multimedia.  Two children.      Current Outpatient Medications  Medication Sig Dispense Refill  . acetaminophen (TYLENOL) 500 MG tablet Take 1,000 mg by mouth every 6 (six) hours as needed for mild pain or headache.    . ALPRAZolam (XANAX) 0.5 MG tablet Take 1 tablet (0.5 mg total) by mouth at bedtime as needed for anxiety. (Patient taking differently: Take 0.25 mg by mouth at bedtime. ) 30 tablet 1  . aspirin 81 MG chewable tablet Chew 81 mg by mouth daily.     . busPIRone (BUSPAR) 10 MG tablet Take 1 tablet (10 mg total) by mouth 3 (three) times daily. (Patient taking differently: Take 10 mg by mouth at bedtime. ) 120 tablet 0  . Cholecalciferol (VITAMIN D PO) Take 4 capsules by mouth daily.     . digoxin (LANOXIN) 0.125 MG tablet Take 1 tablet (0.125 mg total) by mouth daily. 30 tablet 6  . metoprolol succinate (TOPROL-XL) 50 MG 24 hr tablet Take 1 tablet (50 mg total) by mouth 2 (two) times daily. Take with or immediately  following a meal. 60 tablet 6  . potassium chloride SA (K-DUR,KLOR-CON) 20 MEQ tablet Take 1 tablet (20 mEq total) by mouth 2 (two) times daily. 30 tablet 6  . rosuvastatin (CRESTOR) 10 MG tablet Take 1 tablet (10 mg total) by mouth daily at 6 PM. 30 tablet 6  . sacubitril-valsartan (ENTRESTO) 24-26 MG Take 1 tablet by mouth 2 (two) times daily. 60 tablet 6  . sertraline (ZOLOFT) 100 MG tablet Take 1 tablet (100 mg total) by mouth daily. (Patient taking differently: Take 100 mg by mouth at bedtime. ) 60 tablet 0  . spironolactone (ALDACTONE) 25 MG tablet Take 0.5 tablets (12.5 mg total) by mouth daily. 30 tablet 6  . torsemide (DEMADEX) 20 MG tablet Take 2 tablets (40 mg total) by mouth daily. 60 tablet 6  . fluticasone furoate-vilanterol (BREO ELLIPTA) 100-25 MCG/INH AEPB Inhale 1 puff into the lungs daily. Rinse mouth with water after each use (Patient not taking: Reported on 03/26/2018) 1 each 0   No current facility-administered medications for this visit.     No Known Allergies    Review of Systems:   General:  normal appetite, decreased energy, no weight gain, no weight loss, no fever  Cardiac:  no chest pain with exertion, no chest pain at rest, + SOB with exertion, + resting SOB, no PND, no orthopnea, no palpitations, + arrhythmia, no atrial fibrillation, + LE edema, + dizzy spells, + syncope  Respiratory:  + shortness of breath, no home oxygen, + productive cough, + dry cough, no bronchitis, no wheezing, no hemoptysis, no asthma, no pain with inspiration or cough, no sleep apnea, no CPAP at night  GI:   no difficulty swallowing, no reflux, no frequent heartburn, no hiatal hernia, no abdominal pain, no constipation, no diarrhea, no hematochezia, no hematemesis, no melena  GU:   no dysuria,  no frequency, no urinary tract infection, no hematuria, no enlarged prostate, no kidney stones, no kidney disease  Vascular:  no pain suggestive of claudication, no pain in feet, no leg cramps, +  varicose veins, no DVT, no non-healing foot ulcer  Neuro:   no stroke, no TIA's, no seizures, no headaches, no temporary blindness one eye,  no slurred speech, no peripheral neuropathy, no chronic pain, no instability  of gait, no memory/cognitive dysfunction  Musculoskeletal: no arthritis, no joint swelling, no myalgias, no difficulty walking, normal mobility   Skin:   no rash, no itching, no skin infections, no pressure sores or ulcerations  Psych:   + anxiety, no depression, no nervousness, no unusual recent stress  Eyes:   no blurry vision, no floaters, no recent vision changes, does not wear glasses or contacts  ENT:   no hearing loss, no loose or painful teeth, no dentures, last saw dentist many years ago  Hematologic:  no easy bruising, no abnormal bleeding, no clotting disorder, no frequent epistaxis  Endocrine:  no diabetes, does not check CBG's at home           Physical Exam:   BP (!) 80/60 (BP Location: Right Arm, Patient Position: Sitting, Cuff Size: Large)   Pulse 78   Resp 16   Ht 6\' 3"  (1.905 m)   Wt 221 lb (100.2 kg)   SpO2 96% Comment: ON RA  BMI 27.62 kg/m   General:    well-appearing  HEENT:  Unremarkable   Neck:   no JVD, no bruits, no adenopathy   Chest:   clear to auscultation, symmetrical breath sounds, no wheezes, no rhonchi   CV:   RRR, grade III/VI crescendo/decrescendo murmur heard best at LLSB,  no diastolic murmur  Abdomen:  soft, non-tender, no masses   Extremities:  warm, well-perfused, pulses diminished but palpable, no LE edema, + varicose veins  Rectal/GU  Deferred  Neuro:   Grossly non-focal and symmetrical throughout  Skin:   Clean and dry, no rashes, no breakdown   Diagnostic Tests:  Transthoracic Echocardiography  Patient:    Ermal, Haberer MR #:       [id number] Study Date: 03/02/2018 Gender:     M Age:        85 Height:     190.5 cm Weight:     108.2 kg BSA:        2.41 m^2 Pt. Status: Room:   ATTENDING    Minus Breeding,  MD  ORDERING     Minus Breeding, MD  [place], MD  SONOGRAPHER  [facility]  cc:  ------------------------------------------------------------------- LV EF: 25% -   30%  ------------------------------------------------------------------- Indications:      Q23.1 Bicuspid aortic valve.  ------------------------------------------------------------------- History:   PMH:  Tachycardia. Viral myocarditis. Aortic stenosis. Risk factors:  Current tobacco use. Hypertension.  ------------------------------------------------------------------- Study Conclusions  - Procedure narrative: Transthoracic echocardiography. Image   quality was adequate. The study was technically difficult. - Left ventricle: The cavity size was moderately dilated. Wall   thickness was increased in a pattern of mild LVH. Systolic   function was severely reduced. The estimated ejection fraction   was in the range of 25% to 30%. Diffuse hypokinesis. Left   ventricular diastolic function parameters were normal. - Aortic valve: Poorly visualized previously described as bicuspid   Severe calcification and severe AS. Valve area (VTI): 0.77 cm^2.   Valve area (Vmax): 0.7 cm^2. Valve area (Vmean): 0.68 cm^2. - Left atrium: The atrium was moderately dilated. - Atrial septum: No defect or patent foramen ovale was identified.  ------------------------------------------------------------------- Study data:  Comparison was made to the study of 08/02/2006.  Study status:  Routine.  Procedure:  The patient reported no pain pre or post test. Transthoracic echocardiography. Image quality was adequate. The study was technically difficult.  Study completion: There were no complications.  Transthoracic echocardiography.  M-mode, complete 2D, spectral Doppler, and color Doppler.  Birthdate:  Patient birthdate: 1961/02/07.  Age:  Patient is 58 yr old.  Sex:  Gender:  male.    BMI: 29.8 kg/m^2.  Blood pressure:     106/72  Patient status:  Outpatient.  Study date: Study date: 03/02/2018. Study time: 09:38 AM.  Location:  [facility]  -------------------------------------------------------------------  ------------------------------------------------------------------- Left ventricle:  The cavity size was moderately dilated. Wall thickness was increased in a pattern of mild LVH. Systolic function was severely reduced. The estimated ejection fraction was in the range of 25% to 30%. Diffuse hypokinesis. The transmitral flow pattern was normal. The deceleration time of the early transmitral flow velocity was normal. The pulmonary vein flow pattern was normal. The tissue Doppler parameters were normal. Left ventricular diastolic function parameters were normal.  ------------------------------------------------------------------- Aortic valve:  Poorly visualized previously described as bicuspid Severe calcification and severe AS.  Doppler:     VTI ratio of LVOT to aortic valve: 0.22. Valve area (VTI): 0.77 cm^2. Indexed valve area (VTI): 0.32 cm^2/m^2. Peak velocity ratio of LVOT to aortic valve: 0.2. Valve area (Vmax): 0.7 cm^2. Indexed valve area (Vmax): 0.29 cm^2/m^2. Mean velocity ratio of LVOT to aortic valve: 0.2. Valve area (Vmean): 0.68 cm^2. Indexed valve area (Vmean): 0.28 cm^2/m^2.    Mean gradient (S): 33 mm Hg. Peak gradient (S): 52 mm Hg.  ------------------------------------------------------------------- Aorta:  The aorta was normal, not dilated, and non-diseased.  ------------------------------------------------------------------- Mitral valve:   Mildly thickened leaflets .  Doppler:  There was trivial regurgitation.  ------------------------------------------------------------------- Left atrium:  The atrium was moderately dilated.  ------------------------------------------------------------------- Atrial septum:   No defect or patent foramen ovale was identified.   ------------------------------------------------------------------- Right ventricle:  The cavity size was normal. Wall thickness was normal. Systolic function was normal.  ------------------------------------------------------------------- Pulmonic valve:    Doppler:  There was mild regurgitation.  ------------------------------------------------------------------- Tricuspid valve:   Doppler:  There was mild regurgitation.  ------------------------------------------------------------------- Right atrium:  The atrium was normal in size.  ------------------------------------------------------------------- Pericardium:  The pericardium was normal in appearance.  ------------------------------------------------------------------- Systemic veins: Inferior vena cava: The vessel was normal in size. The respirophasic diameter changes were in the normal range (>= 50%), consistent with normal central venous pressure.  ------------------------------------------------------------------- Post procedure conclusions Ascending Aorta:  - The aorta was normal, not dilated, and non-diseased.  ------------------------------------------------------------------- Measurements   Left ventricle                           Value          Reference  LV ID, ED, PLAX chordal           (H)    57.1  mm       43 - 52  LV ID, ES, PLAX chordal           (H)    46.8  mm       23 - 38  LV fx shortening, PLAX chordal    (L)    18    %        >=29  LV PW thickness, ED                      12.7  mm       ----------  IVS/LV PW ratio, ED  1.01           <=1.3  Stroke volume, 2D                        62    ml       ----------  Stroke volume/bsa, 2D                    26    ml/m^2   ----------  LV e&', medial                            5.99  cm/s     ----------  Longitudinal strain, TDI                 9     %        ----------     Ventricular septum                       Value          Reference  IVS thickness, ED                        12.8  mm       ----------    LVOT                                     Value          Reference  LVOT ID, S                               21    mm       ----------  LVOT area                                3.46  cm^2     ----------  LVOT peak velocity, S                    73.7  cm/s     ----------  LVOT mean velocity, S                    52    cm/s     ----------  LVOT VTI, S                              17.8  cm       ----------    Aortic valve                             Value          Reference  Aortic valve peak velocity, S            362   cm/s     ----------  Aortic valve mean velocity, S            263   cm/s     ----------  Aortic valve VTI, S  79.5  cm       ----------  Aortic mean gradient, S                  33    mm Hg    ----------  Aortic peak gradient, S                  52    mm Hg    ----------  VTI ratio, LVOT/AV                       0.22           ----------  Aortic valve area, VTI                   0.77  cm^2     ----------  Aortic valve area/bsa, VTI               0.32  cm^2/m^2 ----------  Velocity ratio, peak, LVOT/AV            0.2            ----------  Aortic valve area, peak velocity         0.7   cm^2     ----------  Aortic valve area/bsa, peak              0.29  cm^2/m^2 ----------  velocity  Velocity ratio, mean, LVOT/AV            0.2            ----------  Aortic valve area, mean velocity         0.68  cm^2     ----------  Aortic valve area/bsa, mean              0.28  cm^2/m^2 ----------  velocity  Aortic regurg peak velocity              309   cm/s     ----------  Aortic regurg pressure half-time         193   ms       ----------  Aortic regurg peak gradient              38    mm Hg    ----------    Aorta                                    Value          Reference  Aortic root ID, ED                       37    mm        ----------    Left atrium                              Value          Reference  LA ID, A-P, ES                           51    mm       ----------  LA ID/bsa, A-P  2.11  cm/m^2   <=2.2  LA volume, S                             66.4  ml       ----------  LA volume/bsa, S                         27.5  ml/m^2   ----------  LA volume, ES, 1-p A4C                   64.6  ml       ----------  LA volume/bsa, ES, 1-p A4C               26.8  ml/m^2   ----------  LA volume, ES, 1-p A2C                   64.7  ml       ----------  LA volume/bsa, ES, 1-p A2C               26.8  ml/m^2   ----------    Right atrium                             Value          Reference  RA ID, S-I, ES, A4C                      46.7  mm       34 - 49  RA area, ES, A4C                         16.9  cm^2     8.3 - 19.5  RA volume, ES, A/L                       48.7  ml       ----------  RA volume/bsa, ES, A/L                   20.2  ml/m^2   ----------    Right ventricle                          Value          Reference  RV s&', lateral, S                        11    cm/s     ----------  Legend: (L)  and  (H)  mark values outside specified reference range.  ------------------------------------------------------------------- Prepared and Electronically Authenticated by  Jenkins Rouge, M.D. 2020-01-24T11:23:10    RIGHT HEART CATH AND CORONARY ANGIOGRAPHY  Conclusion   1. Markedly elevated PCWP.  2. Decreased cardiac output.  3. 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  4. Nonobstructive CAD.   Nonischemic cardiomyopathy.  Reviewed initial echo, looks like severe aortic stenosis.  With marked PCWP elevation, did not cross valve and plan to admit, diurese, and proceed with careful limited echo for AS to confirm severity (possibly TEE if needed).   Procedural Details   Technical Details Procedure: Right Heart Cath, Selective Coronary Angiography  Indication:  Aortic stenosis, CHF   Procedural Details: The right brachial and  radial areas were prepped, draped, and anesthetized with 1% lidocaine. There was a pre-existing peripheral IV that was replaced with a 33F venous sheath. A Swan-Ganz catheter was used for the right heart catheterization. Standard protocol was followed for recording of right heart pressures and sampling of oxygen saturations. Fick cardiac output was calculated. The right radial artery was entered using modified Seldinger technique and a 5F sheath was placed. The patient received 3 mg IA verapamil and weight-based IV heparin. JL3.5 and AR1 were used for selective coronary angiography. There were no immediate procedural complications. The patient was transferred to the post catheterization recovery area for further monitoring.   Estimated blood loss <50 mL.   During this procedure medications were administered to achieve and maintain moderate conscious sedation while the patient's heart rate, blood pressure, and oxygen saturation were continuously monitored and I was present face-to-face 100% of this time.  Medications  (Filter: Administrations occurring from 03/28/18 0820 to 03/28/18 0955)  Medication Rate/Dose/Volume Action  Date Time   Heparin (Porcine) in NaCl 1000-0.9 UT/500ML-% SOLN (mL) 500 mL Given 03/28/18 0836   Total dose as of 04/02/18 1128 500 mL Given 0836   1,500 mL 500 mL Given 0836   fentaNYL (SUBLIMAZE) injection (mcg) 12.5 mcg Given 03/28/18 0849   Total dose as of 04/02/18 1128 12.5 mcg Given 0859   50 mcg 25 mcg Given 0906   midazolam (VERSED) injection (mg) 0.5 mg Given 03/28/18 0849   Total dose as of 04/02/18 1128 0.5 mg Given 0859   2 mg 1 mg Given 0906   lidocaine (PF) (XYLOCAINE) 1 % injection (mL) 2 mL Given 03/28/18 0857   Total dose as of 04/02/18 1128 2 mL Given 0904   4 mL        Radial Cocktail/Verapamil only (mL) 10 mL Given 03/28/18 0915   Total dose as of 04/02/18 1128        10 mL          heparin injection (Units) 5,000 Units Given 03/28/18 0919   Total dose as of 04/02/18 1128        5,000 Units        iohexol (OMNIPAQUE) 350 MG/ML injection (mL) 135 mL Given 03/28/18 0951   Total dose as of 04/02/18 1128        135 mL        Sedation Time   Sedation Time Physician-1: 52 minutes 36 seconds  Coronary Findings   Diagnostic  Dominance: Right  Left Main  No significant disease.  Left Anterior Descending  Small D1 with 80% proximal stenosis. Moderate D2 with 50% proximal stenosis. Luminal irregularities in the LAD.  Left Circumflex  50% mid LCx stenosis.  Right Coronary Artery  50% distal RCA stenosis.  Intervention   No interventions have been documented.  Right Heart   Right Heart Pressures [facility] Procedural Findings: Hemodynamics (mmHg) RA mean 12 RV 64/17 PA 70/32, mean 47 PCWP mean 30  Oxygen saturations: PA 61% AO 97%  Cardiac Output (Fick) 4.05  Cardiac Index (Fick) 1.72 PVR 4.2 WU  Implants    No implant documentation for this case.  [employer]ngo Images   Show images for CARDIAC CATHETERIZATION  MERGE Images   Show images for CARDIAC CATHETERIZATION   Link to Procedure Log   Procedure Log    Hemo Data    Most Recent Value  Fick Cardiac Output 4.05 L/min  Fick Cardiac Output Index 1.72 (L/min)/BSA  RA A Wave 17 mmHg  RA V Wave 19 mmHg  RA Mean 12 mmHg  RV Systolic Pressure 64 mmHg  RV Diastolic Pressure 8 mmHg  RV EDP 17 mmHg  PA Systolic Pressure 70 mmHg  PA Diastolic Pressure 32 mmHg  PA Mean 46 mmHg  PW A Wave 35 mmHg  PW V Wave 32 mmHg  PW Mean 29 mmHg  AO Systolic Pressure 89 mmHg  AO Diastolic Pressure 62 mmHg  AO Mean 75 mmHg  QP/QS 1  TPVR Index 27.31 HRUI  TSVR Index 43.59 HRUI  PVR SVR Ratio 0.29  TPVR/TSVR Ratio 0.63    Cardiac TAVR CT  TECHNIQUE: The patient was scanned on a Siemens Force 916 slice scanner. A 120 kV retrospective scan was triggered in the descending thoracic aorta at 111 HU's. Gantry  rotation speed was 270 msecs and collimation was .9 mm. No beta blockade or nitro were given. The 3D data set was reconstructed in 5% intervals of the R-R cycle. Systolic and diastolic phases were analyzed on a dedicated work station using MPR, MIP and VRT modes. The patient received 80 cc of contrast.  FINDINGS: Aortic Valve: Bicuspid with heavy calcium fusion of right and left cusps. There is large nodular area of calcification at base of both leaflets  Aorta: Dilated root normal arch vessels no coarctation  Sinotubular Junction: 34 mm  Ascending Thoracic Aorta: 43 mm  Aortic Arch: 28 mm  Descending Thoracic Aorta: 23 mm  Sinus of Valsalva Measurements:  Non-coronary: 35.7 mm  Right - coronary: 38.2 mm  Left - coronary: 37.4 mm  Coronary Artery Height above Annulus:  Left Main: 13.8 mm above annulus  Right Coronary: 17 mm above annulus  Virtual Basal Annulus Measurements:  Maximum/Minimum Diameter: 30 mm x 36 mm  Perimeter: 113 mm  Area: 946 mm2  Coronary Arteries: Sufficient height above annulus for deployment but concern for LM as fused cusp heavily calcified and broad would appear to be at risk for occlusion  Optimum Fluoroscopic Angle for Delivery: LAO 14 mm CAudal 14 mm  IMPRESSION: 1. Heavily calcified bicuspid valve with annular area of 946 mm2 would appear to be too large for Medtronic or Sapien 3 valve. The dense opposed nodular calcium in the annulus also Would appear to increase risk of peri valvular regurgitation  2. Coronary arteries sufficient height above annulus for deployment by traditional criteria but concern for broad based fused R/L leaflet being heavily calcified and potential obstruction of LM  3. Optimum angiographic angle for deployment LAO 14 Caudal 14 degrees  4.  Dilated aortic root 4.3 cm  Jenkins Rouge   Electronically Signed   By: Jenkins Rouge M.D.   On: 03/29/2018 16:46    CT  ANGIOGRAPHY CHEST, ABDOMEN AND PELVIS  TECHNIQUE: Multidetector CT imaging through the chest, abdomen and pelvis was performed using the standard protocol during bolus administration of intravenous contrast. Multiplanar reconstructed images and MIPs were obtained and reviewed to evaluate the vascular anatomy.  CONTRAST:  150mL ISOVUE-370 IOPAMIDOL (ISOVUE-370) INJECTION 76%  COMPARISON:  CT the abdomen and pelvis 12/14/2017. Chest CT 08/17/2016.  FINDINGS: CTA CHEST FINDINGS  Cardiovascular: Heart size is mildly enlarged. There is no significant pericardial fluid, thickening or pericardial calcification. There is aortic atherosclerosis, as well as atherosclerosis of the great vessels of the mediastinum and the coronary arteries, including calcified atherosclerotic plaque in the left main, left anterior descending, left circumflex and right coronary arteries. Severe thickening calcification of the aortic valve.  Mediastinum/Lymph Nodes: No pathologically enlarged mediastinal  or hilar lymph nodes. Esophagus is unremarkable in appearance. No axillary lymphadenopathy.  Lungs/Pleura: No suspicious appearing pulmonary nodules or masses are noted. No acute consolidative airspace disease. Small bilateral pleural effusions lying dependently (right greater than left).  Musculoskeletal/Soft Tissues: There are no aggressive appearing lytic or blastic lesions noted in the visualized portions of the skeleton.  CTA ABDOMEN AND PELVIS FINDINGS  Hepatobiliary: No suspicious cystic or solid hepatic lesions. No intra or extrahepatic biliary ductal dilatation. Gallbladder is normal in appearance.  Pancreas: No pancreatic mass. No pancreatic ductal dilatation. No pancreatic or peripancreatic fluid or inflammatory changes.  Spleen: Visualized portions are unremarkable.  Adrenals/Urinary Tract: 1.7 cm low-attenuation lesion in the anterior aspect of the interpolar region of the  right kidney, compatible with a simple cyst. There are 2 intermediate to high attenuation lesions in the left kidney, measuring up to 2.2 cm in the lower pole of the left kidney, stable compared to the prior examination 12/14/2017, statistically likely to represent a proteinaceous/hemorrhagic cysts. Other subcentimeter low-attenuation lesions in the kidneys, too small to characterize, but statistically likely to represent tiny cysts. Bilateral adrenal glands are normal in appearance. No hydroureteronephrosis. Urinary bladder is normal in appearance.  Stomach/Bowel: Normal appearance of the stomach. No pathologic dilatation of small bowel or colon. Normal appendix.  Vascular/Lymphatic: Aortic atherosclerosis, without evidence of aneurysm or dissection in the abdominal or pelvic vasculature. Vascular findings and measurements pertinent to potential TAVR procedure, as detailed below. No lymphadenopathy noted in the abdomen or pelvis.  Reproductive: Prostate gland and seminal vesicles are unremarkable in appearance.  Other: No significant volume of ascites.  No pneumoperitoneum.  Musculoskeletal: There are no aggressive appearing lytic or blastic lesions noted in the visualized portions of the skeleton.  VASCULAR MEASUREMENTS PERTINENT TO TAVR:  AORTA:  Minimal Aortic Diameter-17 x 16 mm  Severity of Aortic Calcification-mild-to-moderate  RIGHT PELVIS:  Right Common Iliac Artery -  Minimal Diameter-10.1 x 8.6 mm  Tortuosity-mild  Calcification-moderate  Right External Iliac Artery -  Minimal Diameter-7.4 x 5.9 mm  Tortuosity - mild  Calcification-none  Right Common Femoral Artery -  Minimal Diameter-7.8 x 6.6 mm  Tortuosity - mild  Calcification-mild  LEFT PELVIS:  Left Common Iliac Artery -  Minimal Diameter-10.2 x 8.4 mm  Tortuosity-severe  Calcification-mild  Left External Iliac Artery -  Minimal Diameter-8.0 x 7.7  mm  Tortuosity-moderate  Calcification-mild  Left Common Femoral Artery -  Minimal Diameter-7.2 x 8.0 mm  Tortuosity-mild  Calcification-mild  Review of the MIP images confirms the above findings.  IMPRESSION: 1. Vascular findings and measurements pertinent to potential TAVR procedure, as detailed above. 2. Severe thickening calcification of the aortic valve, compatible with the reported clinical history of severe aortic stenosis. 3. Aortic atherosclerosis, in addition to left main and 3 vessel coronary artery disease. Please note that although the presence of coronary artery calcium documents the presence of coronary artery disease, the severity of this disease and any potential stenosis cannot be assessed on this non-gated CT examination. Assessment for potential risk factor modification, dietary therapy or pharmacologic therapy may be warranted, if clinically indicated. 4. Small bilateral pleural effusions (right greater than left). 5. Additional incidental findings, as above.   Electronically Signed   By: Vinnie Langton M.D.   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  He presents with  recent acute exacerbation of chronic systolic congestive heart failure, [place] Heart Association functional class IV.  Symptoms have responded well to medical therapy.  I personally reviewed the patient's recent transthoracic echocardiograms, diagnostic cardiac catheterization, and CT angiograms.  The patient has a Sievers type I bicuspid aortic valve with fusion of the left and right cusps of the valve.  There is severe calcification involving both leaflets and the annulus itself.  Peak velocity across the aortic valve measured 3.6 m/s despite the presence of severe global left ventricular systolic dysfunction  and ejection fraction estimated only 25%.  The DVI was reportedly quite low and aortic valve area was estimated only 0.68 cm.  Diagnostic cardiac catheterization is notable for the presence of mild nonobstructive coronary artery disease.  Cardiac output was low and pulmonary artery pressures moderate to severely elevated at the time of his recent catheterization.  He was notably an acute exacerbation of chronic heart failure at that time.  There is no question the patient needs aortic valve replacement.  CT angiography confirms the presence of aneurysmal enlargement of the entire aortic root.  The overall size of the aortic valve and aortic annulus is much too large to accommodate any of the currently commercially available transcatheter heart valves.  Moreover, there is severe annular calcification which would further increase the likelihood of significant paravalvular leak.  Finally, the patient has aneurysmal enlargement of the aortic root which would put the patient at risk of aortic dissection in the future if his aneurysm was left untreated.  Given the patient's relatively young age I feel that an aggressive approach is warranted and he should undergo Bentall aortic root replacement.  Risks associated with surgery will clearly be increased because of the severity of left ventricular systolic dysfunction.  Furthermore, the patient has longstanding history of tobacco abuse and significant alcohol use.  The patient has baseline left bundle branch block on EKG and may be at somewhat increased risk for the development of complete heart block postoperatively.  Finally, the patient has known history of supraventricular tachycardia and will likely remain at significant risk for atrial dysrhythmias early following surgery.    Plan:  The patient, his husband and his son were counseled at length regarding treatment alternatives for management of severe symptomatic aortic stenosis with aneurysmal enlargement of the  aortic root. Alternative approaches such as conventional aortic valve replacement with or without aortic root replacement, transcatheter aortic valve replacement, and continued medical therapy without intervention were compared and contrasted at length.  The risks associated with conventional surgical aortic root replacement were discussed in detail, as were expectations for post-operative convalescence.  Discussion was held comparing the relative risks of mechanical valve replacement with need for lifelong anticoagulation versus use of a bioprosthetic tissue valve and the associated potential for late structural valve deterioration and failure.  This discussion was placed in the context of the patient's particular circumstances, and as a result the patient specifically requests that their valve be replaced using a bioprosthetic tissue valve.  The patient understands and accepts all potential associated risks of surgery including but not limited to risk of death, stroke, myocardial infarction, congestive heart failure, respiratory failure, renal failure, pneumonia, bleeding requiring blood transfusion and or reexploration, arrhythmia, heart block or bradycardia requiring permanent pacemaker, aortic dissection or other major vascular complication, pleural effusions or other delayed complications related to continued congestive heart failure, and other late complications related to valve replacement including structural valve deterioration and failure, thrombosis, endocarditis, or paravalvular  leak.  We tentatively plan to proceed with surgery on Tuesday April 10, 2018.  Prior to the surgery the patient will be referred for dental service consultation for routine examination and cleaning.  All of the patient's questions have been addressed.     I spent in excess of 90 minutes during the conduct of this office consultation and >50% of this time involved direct face-to-face encounter with the patient for  counseling and/or coordination of their care.      Valentina Gu. Roxy Manns, MD 04/02/2018 10:04 AM

## 2018-04-02 NOTE — Patient Instructions (Addendum)
Do not smoke   Stop taking aspirin  Continue taking all other current medications without change through the day before surgery.  Have nothing to eat or drink after midnight the night before surgery.  On the morning of surgery do not take any of your regularly scheduled medications

## 2018-04-03 ENCOUNTER — Encounter (HOSPITAL_COMMUNITY): Payer: Self-pay | Admitting: Dentistry

## 2018-04-03 ENCOUNTER — Ambulatory Visit (HOSPITAL_COMMUNITY): Payer: Self-pay | Admitting: Dentistry

## 2018-04-03 VITALS — BP 83/63 | HR 61 | Temp 97.5°F

## 2018-04-03 DIAGNOSIS — K06010 Localized gingival recession, unspecified: Secondary | ICD-10-CM

## 2018-04-03 DIAGNOSIS — Z01818 Encounter for other preprocedural examination: Secondary | ICD-10-CM

## 2018-04-03 DIAGNOSIS — K029 Dental caries, unspecified: Secondary | ICD-10-CM

## 2018-04-03 DIAGNOSIS — K08409 Partial loss of teeth, unspecified cause, unspecified class: Secondary | ICD-10-CM

## 2018-04-03 DIAGNOSIS — K045 Chronic apical periodontitis: Secondary | ICD-10-CM

## 2018-04-03 DIAGNOSIS — K0530 Chronic periodontitis, unspecified: Secondary | ICD-10-CM | POA: Diagnosis not present

## 2018-04-03 DIAGNOSIS — I350 Nonrheumatic aortic (valve) stenosis: Secondary | ICD-10-CM

## 2018-04-03 DIAGNOSIS — M270 Developmental disorders of jaws: Secondary | ICD-10-CM

## 2018-04-03 DIAGNOSIS — K036 Deposits [accretions] on teeth: Secondary | ICD-10-CM

## 2018-04-03 DIAGNOSIS — K083 Retained dental root: Secondary | ICD-10-CM

## 2018-04-03 DIAGNOSIS — I77810 Thoracic aortic ectasia: Secondary | ICD-10-CM

## 2018-04-03 DIAGNOSIS — M264 Malocclusion, unspecified: Secondary | ICD-10-CM

## 2018-04-03 DIAGNOSIS — K0889 Other specified disorders of teeth and supporting structures: Secondary | ICD-10-CM

## 2018-04-03 NOTE — Addendum Note (Signed)
Addended by: Lenn Cal on: 04/03/2018 11:47 AM   Modules accepted: Orders, SmartSet

## 2018-04-03 NOTE — Pre-Procedure Instructions (Signed)
[facility]  04/03/2018      [facility] [address] [place] [facility] [place] [facility] [facility] Phone: [phone] Fax: [phone]  [id number] - [place] [facility] [place] [facility] Phone: [phone] Fax: [phone]  [facility] - [address] [place] [address] Phone: [phone] Fax: [phone]    Your procedure is scheduled on April 05, 2018.  Report to [facility] at 530 AM.  Call this number if you have problems the morning of surgery:  [phone]   Remember:  Do not eat or drink after midnight, tonight 04/04/2018.    Take these medicines the morning of surgery with A SIP OF WATER  metoprolol succinate (TOPROL-XL) busPIRone (BUSPAR)  fluticasone furoate-vilanterol (BREO ELLIPTA) 100-25 MCG/INH AEPB sacubitril-valsartan (ENTRESTO)   IF NEEDED: acetaminophen (TYLENOL)  ALPRAZolam (XANAX)  Follow your surgeon's instructions on when to hold/resume aspirin.  If no instructions were given call the office to determine how they would like to you take aspirin   Beginning now, STOP taking any Aleve, Naproxen, Ibuprofen, Motrin, Advil, Goody's, BC's, all herbal medications, fish oil, and all vitamins   Do not wear jewelry  Do not wear lotions, powders, or colognes, or deodorant.  Men may shave face and neck.  Do not bring valuables to the [facility].  [facility] is not responsible for any belongings or valuables.  Contacts, dentures or bridgework may not be worn into surgery.  Leave your suitcase in the car.  After surgery it may be brought to your room.  For patients admitted to the [facility], discharge time will be determined by your treatment team.  Patients discharged the day of surgery will not be allowed to [facility] home.    [facility]- Preparing For Surgery  Before  surgery, you can play an important role. Because skin is not sterile, your skin needs to be as free of germs as possible. You can reduce the number of germs on your skin by washing with CHG (chlorahexidine gluconate) Soap before surgery.  CHG is an antiseptic cleaner which kills germs and bonds with the skin to continue killing germs even after washing.    Oral Hygiene is also important to reduce your risk of infection.  Remember - BRUSH YOUR TEETH THE MORNING OF SURGERY WITH YOUR REGULAR TOOTHPASTE  Please do not use if you have an allergy to CHG or antibacterial soaps. If your skin becomes reddened/irritated stop using the CHG.  Do not shave (including legs and underarms) for at least 48 hours prior to first CHG shower. It is OK to shave your face.  Please follow these instructions carefully.   1. Shower the NIGHT BEFORE SURGERY and the MORNING OF SURGERY with CHG.   2. If you chose to wash your hair, wash your hair first as usual with your normal shampoo.  3. After you shampoo, rinse your hair and body thoroughly to remove the shampoo.  4. Use CHG as you would any other liquid soap. You can apply CHG directly to the skin and wash gently with a scrungie or a clean washcloth.   5. Apply the CHG Soap to your body ONLY FROM THE NECK DOWN.  Do not use on open wounds or open sores. Avoid contact with your eyes, ears, mouth and genitals (private parts). Wash Face and genitals (private parts)  with your normal soap.  6. Wash thoroughly, paying special attention to the area where your surgery will be performed.  7. Thoroughly rinse your body with warm water from the neck down.  8. DO NOT shower/wash with your normal soap after using and rinsing off the CHG Soap.  9. Pat yourself dry with a CLEAN TOWEL.  10. Wear CLEAN PAJAMAS to bed the night before surgery, wear comfortable clothes the morning of surgery  11. Place CLEAN SHEETS on your bed the night of your first shower and DO NOT SLEEP WITH  PETS.  Day of Surgery:  Do not apply any deodorants/lotions.  Please wear clean clothes to the [facility]surgery center.   Remember to brush your teeth WITH YOUR REGULAR TOOTHPASTE.  Please read over the following fact sheets that you were given.

## 2018-04-03 NOTE — Progress Notes (Addendum)
DENTAL CONSULTATION  Date of Consultation:  04/03/2018 Patient Name:   Tyler Berger Date of Birth:   06-Jun-1960 Medical Record Number: [id number]  VITALS: BP (!) 83/63 (BP Location: Right Arm)   Pulse 61   Temp (!) 97.5 F (36.4 C)   CHIEF COMPLAINT: Patient referred by Dr. Roxy Manns for dental consultation.  HPI: Tyler Berger is a 58 year old male recently diagnosed with severe aortic stenosis and aortic root dilatation. Patient with anticipated Bentall procedure with Dr. Roxy Manns. Patient is now seen as part of a medically necessary pre-heart surgery dental protocol examination to rule out dental infection that may affect the patient's systemic health and anticipated heart surgery.  The patient currently denies acute toothaches, swellings, or abscesses. Patient knows that he has multiple broken teeth. Patient last saw a dentist in 2011 or 2012 for fabrication of a maxillary acrylic partial denture replacing tooth #7. This was with [facility]. Patient indicates the maxillary Partial denture "fits fine". Prior to that, the patient was seen for root canal therapy on tooth #7 with Dr. Gae Bon on 10/05/2004 and an exam and cleaning with Dr. Gae Bon on 05/18/2004.  The patient denies having any dental phobia.  PROBLEM LIST: Patient Active Problem List   Diagnosis Date Noted  . Dilated aortic root (HCC)     Priority: High  . Severe aortic stenosis     Priority: High  . Acute on chronic systolic CHF (congestive heart failure) ([place]) 03/28/2018  . SVT (supraventricular tachycardia) ([place]) 02/16/2018  . Tobacco abuse 02/16/2018  . Bicuspid aortic valve 06/01/2017  . LBBB (left bundle branch block) 06/01/2017  . Varicose veins of both lower extremities 06/01/2016  . Medication management 08/25/2013  . Prediabetes 08/25/2013  . Hyperlipidemia   . Hypertension   . Anxiety   . Vitamin D deficiency     PMH: Past Medical History:  Diagnosis Date  . Anxiety   . Aortic stenosis   . CAD (coronary artery  disease)   . Dilated aortic root ([place])   . Hypertension   . Nonischemic cardiomyopathy ([place])   . SVT (supraventricular tachycardia) ([place])   . Viral myocarditis 2004  . Vitamin D deficiency     PSH: Past Surgical History:  Procedure Laterality Date  . HERNIA REPAIR Left 2010  . ORCHIECTOMY Left 1982  . RIGHT HEART CATH AND CORONARY ANGIOGRAPHY N/A 03/28/2018   Procedure: RIGHT HEART CATH AND CORONARY ANGIOGRAPHY;  Surgeon: Larey Dresser, MD;  Location: [facility];  Service: Cardiovascular;  Laterality: N/A;  . VARICOSE VEIN SURGERY Left 05/1998    ALLERGIES: No Known Allergies  MEDICATIONS: Current Outpatient Medications  Medication Sig Dispense Refill  . acetaminophen (TYLENOL) 500 MG tablet Take 1,000 mg by mouth every 6 (six) hours as needed for mild pain or headache.    . ALPRAZolam (XANAX) 0.5 MG tablet Take 1 tablet (0.5 mg total) by mouth at bedtime as needed for anxiety. (Patient taking differently: Take 0.25 mg by mouth at bedtime. ) 30 tablet 1  . aspirin 81 MG chewable tablet Chew 81 mg by mouth daily.     . busPIRone (BUSPAR) 10 MG tablet Take 1 tablet (10 mg total) by mouth 3 (three) times daily. (Patient taking differently: Take 10 mg by mouth at bedtime. ) 120 tablet 0  . Cholecalciferol (VITAMIN D PO) Take 4 capsules by mouth daily.     . digoxin (LANOXIN) 0.125 MG tablet Take 1 tablet (0.125 mg total) by mouth daily. 30 tablet 6  .  metoprolol succinate (TOPROL-XL) 50 MG 24 hr tablet Take 1 tablet (50 mg total) by mouth 2 (two) times daily. Take with or immediately following a meal. 60 tablet 6  . potassium chloride SA (K-DUR,KLOR-CON) 20 MEQ tablet Take 1 tablet (20 mEq total) by mouth 2 (two) times daily. 30 tablet 6  . rosuvastatin (CRESTOR) 10 MG tablet Take 1 tablet (10 mg total) by mouth daily at 6 PM. 30 tablet 6  . sacubitril-valsartan (ENTRESTO) 24-26 MG Take 1 tablet by mouth 2 (two) times daily. 60 tablet 6  . sertraline (ZOLOFT) 100 MG tablet  Take 1 tablet (100 mg total) by mouth daily. (Patient taking differently: Take 100 mg by mouth at bedtime. ) 60 tablet 0  . spironolactone (ALDACTONE) 25 MG tablet Take 0.5 tablets (12.5 mg total) by mouth daily. 30 tablet 6  . torsemide (DEMADEX) 20 MG tablet Take 2 tablets (40 mg total) by mouth daily. 60 tablet 6  . fluticasone furoate-vilanterol (BREO ELLIPTA) 100-25 MCG/INH AEPB Inhale 1 puff into the lungs daily. Rinse mouth with water after each use (Patient not taking: Reported on 03/26/2018) 1 each 0   No current facility-administered medications for this visit.     LABS: Lab Results  Component Value Date   WBC 5.4 03/30/2018   HGB 15.0 03/30/2018   HCT 45.7 03/30/2018   MCV 88.2 03/30/2018   PLT 110 (L) 03/30/2018      Component Value Date/Time   NA 139 03/30/2018 0503   NA 140 03/27/2018 1527   K 4.0 03/30/2018 0503   CL 106 03/30/2018 0503   CO2 21 (L) 03/30/2018 0503   GLUCOSE 120 (H) 03/30/2018 0503   BUN 16 03/30/2018 0503   BUN 20 03/27/2018 1527   CREATININE 0.77 03/30/2018 0503   CREATININE 0.93 12/11/2017 1154   CALCIUM 9.2 03/30/2018 0503   GFRNONAA >60 03/30/2018 0503   GFRNONAA 90 12/11/2017 1154   GFRAA >60 03/30/2018 0503   GFRAA 105 12/11/2017 1154   Lab Results  Component Value Date   INR 1.0 12/11/2017   INR 1.0 08/17/2006   INR 1.0 08/01/2006   No results found for: PTT  SOCIAL HISTORY: Social History   Socioeconomic History  . Marital status: Married    Spouse name: Not on file  . Number of children: 2  . Years of education: Not on file  . Highest education level: Not on file  Occupational History  . 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 Financial resource strain: Not on file  . Food insecurity:    Worry: Not on file    Inability: Not on file  . Transportation needs:    Medical: Not on file    Non-medical: Not on file  Tobacco Use  . Smoking status: Current Every Day Smoker    Packs/day: 0.75     Years: 30.00    Pack years: 22.50    Types: Cigarettes  . Smokeless tobacco: Never Used  Substance and Sexual Activity  . Alcohol use: Yes    Alcohol/week: 2.0 standard drinks    Types: 2 Glasses of wine per week  . Drug use: Never  . Sexual activity: Not on file  Lifestyle  . Physical activity:    Days per week: Not on file    Minutes per session: Not on file  . Stress: Not on file  Relationships  . Social connections:    Talks on phone: Not on file    Gets together:  Not on file    Attends religious service: Not on file    Active member of club or organization: Not on file    Attends meetings of clubs or organizations: Not on file    Relationship status: Not on file  . Intimate partner violence:    Fear of current or ex partner: Not on file    Emotionally abused: Not on file    Physically abused: Not on file    Forced sexual activity: Not on file  Other Topics Concern  . Not on file  Social History Narrative   Married, med Designer, multimedia.  Two children.      FAMILY HISTORY: Family History  Problem Relation Age of Onset  . Diabetes Mother   . Hypertension Mother   . Cancer Mother        breast  . Alzheimer's disease Mother   . Hypertension Father   . Cancer Father        head and neck  . Hypertension Sister     REVIEW OF SYSTEMS: Reviewed with the patient as per History of present illness. Psych: Patient denies having dental phobia.  DENTAL HISTORY: CHIEF COMPLAINT: Patient referred by Dr. Roxy Manns for dental consultation.  HPI: Chais Fehringer is a 59 year old male recently diagnosed with severe aortic stenosis and aortic root dilatation. Patient with anticipated Bentall procedure with Dr. Roxy Manns. Patient is now seen as part of a medically necessary pre-heart surgery dental protocol examination to rule out dental infection that may affect the patient's systemic health and anticipated heart surgery.  The patient currently denies acute toothaches, swellings, or abscesses. Patient  knows that he has multiple broken teeth. Patient last saw a dentist in 2011 or 2012 for fabrication of a maxillary acrylic partial denture replacing tooth #7. This was with [facility]. Patient indicates the maxillary Partial denture "fits fine". Prior to that, the patient was seen for root canal therapy on tooth #7 with Dr. Gae Bon on 10/05/2004 and an exam and cleaning with Dr. Gae Bon on 05/18/2004.  The patient denies having any dental phobia.  DENTAL EXAMINATION: GENERAL:  The patient is a well-developed, well-nourished male in no acute distress. HEAD AND NECK:  There is no palpable neck lymphadenopathy. The patient denies acute TMJ symptoms. INTRAORAL EXAM:  The patient has bilateral mandibular lingual tori. The patient has a deep palatal vault. There is no evidence of oral abscess formation. DENTITION:  Patient is missing tooth numbers 1, 2, 7, 15, 16, 17. Patient has retained roots in the area tooth numbers 3, 14, 31, and 32. PERIODONTAL:  Patient has chronic, advanced periodontal disease with plaque and calculus accumulations, gingival recession, and moderate to severe bone loss. Tooth mobility is noted as per dental charting form. Periodontal charting was deferred secondary to gross amounts of calculus. DENTAL CARIES/SUBOPTIMAL RESTORATIONS: Multiple dental caries are noted as per dental charting form. ENDODONTIC:  The patient currently denies acute pulpitis symptoms. Patient has multiple areas of periapical pathology including tooth numbers 3, 14, 19, 23, 31, and 32.  Patient had a previous root canal therapy associated with tooth numbers 7 that the patient has lost that tooth since then. CROWN AND BRIDGE:  There are no crown or bridge restorations noted. PROSTHODONTIC:  The patient has a maxillary acrylic partial denture replacing tooth #7. The soft tissue portion on the right side of the partial denture is fractured off, but the partial denture is still stable and retentive.   OCCLUSION:  The  patient has a poor occlusal  scheme secondary to multiple missing teeth, multiple retained root segments, malocclusion, and lack of replacement of the missing teeth with clinically acceptable dental prostheses.  RADIOGRAPHIC INTERPRETATION: Orthopantogram was taken and supplemented with a full series of dental radiographs. There are multiple missing teeth. There are multiple retained root segments. There is moderate to severe bone loss. Radiographic calculus is noted. Multiple dental caries are noted. There is supra-eruption and drifting of the unopposed teeth into the edentulous areas. There is furcation involvement noted involving tooth #19. Multiple areas of periapical pathology and radiolucency are noted.there are radiopaque areas involving the mandible consistent with bilateral mandibular lingual tori.   ASSESSMENTS: 1. Severe aortic stenosis 2. Dilated aortic root 3. Anticipated Bentall procedure 4. Pre-heart surgery dental protocol examination 5. Chronic apical periodontitis 6. Retained root segments 7. Dental caries 8. Chronic periodontitis with bone loss 9. Gingival recession 10. Accretions 11. Tooth mobility 12. Multiple missing teeth 13. Suboptimal maxillary acrylic partial denture 14. Bilateral mandibular lingual tori 15. Poor occlusal scheme and malocclusion   PLAN/RECOMMENDATIONS: 1. I discussed the risks, benefits, and complications of various treatment options with the patient in relationship to his medical and dental conditions, anticipated Bentall procedure, and risk for infection and endocarditis. We discussed various treatment options to include no treatment, multiple extractions with alveoloplasty, pre-prosthetic surgery as indicated, periodontal therapy, dental restorations, root canal therapy, crown and bridge therapy, implant therapy, and replacement of missing teeth as indicated. The patient currently will think about potentially having tooth numbers 3, 12, 14, and  19, 23, 31, and 32 extracted with alveoloplasty and gross debridement of the remaining dentition in the operating with general anesthesia.  However, the patient's initial reaction was to NOT proceed with dental treatment at this time.  The patient is to contact his cardiologist and heart surgeon to discuss ability to proceed with the Bentall procedure without having dental treatment provided before the Bentall procedure. Patient expressed understanding about the potential risk for infection due to his poor dentition in spite of not having acute toothaches at this time.  The patient will contact dental medicine if and when he wishes to proceed with dental treatment as described above. ADDENDUM: Patient called back to indicated that he wished to proceed with dental treatment in the operating room as described above. Operating room procedure was then scheduled for Thursday, 04/05/2018 at 7:30 AM at [facility]. The patient should be able to proceed with heart surgery with Dr. Roxy Manns on Tuesday, 04/10/2018, barring any complications from the dental surgery.  2. Discussion of findings with medical team and coordination of future medical and dental care as needed.  I spent in excess of  120 minutes during the conduct of this consultation and >50% of this time involved direct face-to-face encounter for counseling and/or coordination of the patient's care.    Lenn Cal, DDS

## 2018-04-03 NOTE — Patient Instructions (Signed)
[facility]    Department of Dental Medicine     DR. KULINSKI      HEART VALVES AND MOUTH CARE:  FACTS:   If you have any infection in your mouth, it can infect your heart valve.  If you heart valve is infected, you will be seriously ill.  Infections in the mouth can be SILENT and do not always cause pain.  Examples of infections in the mouth are gum disease, dental cavities, and abscesses.  Some possible signs of infection are: Bad breath, bleeding gums, or teeth that are sensitive to sweets, hot, and/or cold. There are many other signs as well.  WHAT YOU HAVE TO DO:   Brush your teeth after meals and at bedtime. Spend at least 2 minutes brushing well, especially behind your back teeth and all around your teeth that stand alone. Brush at the gumline also.  Do not go to bed without brushing your teeth and flossing.  If you gums bleed when you brush or floss, do NOT stop brushing or flossing. It usually means that your gums need more attention and better cleaning.   If your Dentist or Dr. Kulinski gave you a prescription mouthwash to use, make sure to use it as directed. If you run out of the medication, get a refill at the pharmacy.   If you were given any other medications or directions by your Dentist, please follow them. If you did not understand the directions or forget what you were told, please call. We will be happy to refresh her memory.  If you need antibiotics before dental procedures, make sure you take them one hour prior to every dental visit as directed.   Get a dental checkup every 4-6 months in order to keep your mouth healthy, or to find and treat any new infection. You will most likely need your teeth cleaned or gums treated at the same time.  If you are not able to come in for your scheduled appointment, call your Dentist as soon as possible to reschedule.  If you have a problem in between dental visits, call your Dentist.  

## 2018-04-04 ENCOUNTER — Other Ambulatory Visit: Payer: Self-pay

## 2018-04-04 ENCOUNTER — Ambulatory Visit (HOSPITAL_COMMUNITY)
Admission: RE | Admit: 2018-04-04 | Discharge: 2018-04-04 | Disposition: A | Discharge status: Home / Self Care | Payer: Managed Care, Other (non HMO) | Source: Ambulatory Visit | Attending: Thoracic Surgery (Cardiothoracic Vascular Surgery) | Admitting: Thoracic Surgery (Cardiothoracic Vascular Surgery)

## 2018-04-04 ENCOUNTER — Encounter (HOSPITAL_COMMUNITY): Payer: Self-pay

## 2018-04-04 ENCOUNTER — Encounter (HOSPITAL_COMMUNITY)
Admission: RE | Admit: 2018-04-04 | Discharge: 2018-04-04 | Disposition: A | Discharge status: Home / Self Care | Payer: Managed Care, Other (non HMO) | Source: Ambulatory Visit | Attending: Dentistry | Admitting: Dentistry

## 2018-04-04 DIAGNOSIS — I719 Aortic aneurysm of unspecified site, without rupture: Secondary | ICD-10-CM | POA: Insufficient documentation

## 2018-04-04 DIAGNOSIS — I350 Nonrheumatic aortic (valve) stenosis: Secondary | ICD-10-CM | POA: Diagnosis not present

## 2018-04-04 DIAGNOSIS — I7121 Aneurysm of the ascending aorta, without rupture: Secondary | ICD-10-CM

## 2018-04-04 LAB — PULMONARY FUNCTION TEST
DL/VA % pred: 70 %
DL/VA: 2.94 ml/min/mmHg/L
DLCO COR % PRED: 68 %
DLCO COR: 22.16 ml/min/mmHg
DLCO unc % pred: 72 %
DLCO unc: 23.63 ml/min/mmHg
FEF 25-75 POST: 2.3 L/s
FEF 25-75 Pre: 2.02 L/sec
FEF2575-%Change-Post: 13 %
FEF2575-%Pred-Post: 64 %
FEF2575-%Pred-Pre: 56 %
FEV1-%Change-Post: 5 %
FEV1-%Pred-Post: 73 %
FEV1-%Pred-Pre: 70 %
FEV1-Post: 3.2 L
FEV1-Pre: 3.05 L
FEV1FVC-%Change-Post: 5 %
FEV1FVC-%PRED-PRE: 90 %
FEV6-%Change-Post: 1 %
FEV6-%Pred-Post: 80 %
FEV6-%Pred-Pre: 78 %
FEV6-Post: 4.38 L
FEV6-Pre: 4.31 L
FEV6FVC-%Change-Post: 1 %
FEV6FVC-%Pred-Post: 103 %
FEV6FVC-%Pred-Pre: 101 %
FVC-%Change-Post: 0 %
FVC-%Pred-Post: 77 %
FVC-%Pred-Pre: 77 %
FVC-Post: 4.41 L
FVC-Pre: 4.42 L
Post FEV1/FVC ratio: 73 %
Post FEV6/FVC ratio: 99 %
Pre FEV1/FVC ratio: 69 %
Pre FEV6/FVC Ratio: 98 %
RV % pred: 192 %
RV: 4.77 L
TLC % pred: 117 %
TLC: 9.39 L

## 2018-04-04 LAB — CBC
HCT: 51.5 % (ref 39.0–52.0)
Hemoglobin: 17.2 g/dL — ABNORMAL HIGH (ref 13.0–17.0)
MCH: 29.5 pg (ref 26.0–34.0)
MCHC: 33.4 g/dL (ref 30.0–36.0)
MCV: 88.3 fL (ref 80.0–100.0)
PLATELETS: 283 10*3/uL (ref 150–400)
RBC: 5.83 MIL/uL — ABNORMAL HIGH (ref 4.22–5.81)
RDW: 13.6 % (ref 11.5–15.5)
WBC: 7.4 10*3/uL (ref 4.0–10.5)
nRBC: 0 % (ref 0.0–0.2)

## 2018-04-04 LAB — BLOOD GAS, ARTERIAL
Acid-base deficit: 3.5 mmol/L — ABNORMAL HIGH (ref 0.0–2.0)
Bicarbonate: 20.1 mmol/L (ref 20.0–28.0)
DRAWN BY: 470591
FIO2: 21
O2 Saturation: 95.5 %
Patient temperature: 98.6
pCO2 arterial: 30.5 mmHg — ABNORMAL LOW (ref 32.0–48.0)
pH, Arterial: 7.434 (ref 7.350–7.450)
pO2, Arterial: 78.8 mmHg — ABNORMAL LOW (ref 83.0–108.0)

## 2018-04-04 LAB — URINALYSIS, ROUTINE W REFLEX MICROSCOPIC
Bilirubin Urine: NEGATIVE
GLUCOSE, UA: NEGATIVE mg/dL
Hgb urine dipstick: NEGATIVE
Ketones, ur: NEGATIVE mg/dL
Leukocytes,Ua: NEGATIVE
Nitrite: NEGATIVE
PH: 5 (ref 5.0–8.0)
Protein, ur: NEGATIVE mg/dL
Specific Gravity, Urine: 1.006 (ref 1.005–1.030)

## 2018-04-04 LAB — COMPREHENSIVE METABOLIC PANEL
ALT: 35 U/L (ref 0–44)
AST: 37 U/L (ref 15–41)
Albumin: 4.4 g/dL (ref 3.5–5.0)
Alkaline Phosphatase: 53 U/L (ref 38–126)
Anion gap: 13 (ref 5–15)
BUN: 28 mg/dL — ABNORMAL HIGH (ref 6–20)
CHLORIDE: 105 mmol/L (ref 98–111)
CO2: 15 mmol/L — AB (ref 22–32)
Calcium: 9.4 mg/dL (ref 8.9–10.3)
Creatinine, Ser: 1 mg/dL (ref 0.61–1.24)
GFR calc Af Amer: >60 mL/min (ref >60)
GFR calc non Af Amer: >60 mL/min (ref >60)
Glucose, Bld: 110 mg/dL — ABNORMAL HIGH (ref 70–99)
POTASSIUM: 5 mmol/L (ref 3.5–5.1)
SODIUM: 133 mmol/L — AB (ref 135–145)
Total Bilirubin: 0.8 mg/dL (ref 0.3–1.2)
Total Protein: 7.6 g/dL (ref 6.5–8.1)

## 2018-04-04 LAB — SURGICAL PCR SCREEN
MRSA, PCR: NEGATIVE
Staphylococcus aureus: POSITIVE — AB

## 2018-04-04 LAB — ABO/RH
ABO/RH(D): A POS
Weak D: POSITIVE

## 2018-04-04 LAB — TYPE AND SCREEN
ABO/RH(D): A POS
ANTIBODY SCREEN: NEGATIVE

## 2018-04-04 LAB — HEMOGLOBIN A1C
Hgb A1c MFr Bld: 5.7 % — ABNORMAL HIGH (ref 4.8–5.6)
Mean Plasma Glucose: 116.89 mg/dL

## 2018-04-04 LAB — APTT: aPTT: 32 seconds (ref 24–36)

## 2018-04-04 LAB — PROTIME-INR
INR: 0.9 (ref 0.8–1.2)
Prothrombin Time: 12.5 seconds (ref 11.4–15.2)

## 2018-04-04 MED ORDER — ALBUTEROL SULFATE (2.5 MG/3ML) 0.083% IN NEBU
2.5000 mg | INHALATION_SOLUTION | Freq: Once | RESPIRATORY_TRACT | Status: AC
  Administered 2018-04-04: 2.5 mg via RESPIRATORY_TRACT

## 2018-04-04 NOTE — Progress Notes (Signed)
PCR: Positive for Staph  Prescription called in to [facility] on [facility]  Pt called and stated understanding of instructions.

## 2018-04-04 NOTE — Progress Notes (Signed)
PCP - [facility] Cardiologist - Lou Miner  Chest x-ray - 03-29-18 EKG - 03-28-18 ECHO - 03-28-18 Cardiac Cath - 03-28-18  Anesthesia review: yes, heart history  Patient denies shortness of breath, fever, cough and chest pain at PAT appointment   Patient verbalized understanding of instructions that were given to them at the PAT appointment. Patient was also instructed that they will need to review over the PAT instructions again at home before surgery.   Pt scheduled for dental surgery on 04-05-18 and for aortic root replacement surgery on 04-10-18.  Pt originally scheduled for 2nd pre-op appointment on Friday, 04-06-18.  To save patient time and from having to come back for another appointment, we went ahead and did both pre-op appointments today (04-04-18).  All bloodwork drawn today (04-04-18), EKG is within (1) month, and CXR is within (2) weeks.    Pt also given instructions for surgery on 04-10-18, and stated understanding.  Instructed pt to take Metoprolol, Entresto, & Digoxin DOS (04-10-18).

## 2018-04-05 ENCOUNTER — Ambulatory Visit (HOSPITAL_COMMUNITY): Payer: Managed Care, Other (non HMO) | Admitting: Certified Registered Nurse Anesthetist

## 2018-04-05 ENCOUNTER — Encounter (HOSPITAL_COMMUNITY)
Admission: RE | Disposition: A | Discharge status: Home / Self Care | Payer: Self-pay | Source: Home / Self Care | Attending: Dentistry

## 2018-04-05 ENCOUNTER — Encounter (HOSPITAL_COMMUNITY): Payer: Self-pay | Admitting: *Deleted

## 2018-04-05 ENCOUNTER — Ambulatory Visit (HOSPITAL_COMMUNITY)
Admission: RE | Admit: 2018-04-05 | Discharge: 2018-04-05 | Disposition: A | Discharge status: Home / Self Care | Payer: Managed Care, Other (non HMO) | Attending: Dentistry | Admitting: Dentistry

## 2018-04-05 DIAGNOSIS — I77810 Thoracic aortic ectasia: Secondary | ICD-10-CM | POA: Diagnosis not present

## 2018-04-05 DIAGNOSIS — I428 Other cardiomyopathies: Secondary | ICD-10-CM | POA: Diagnosis not present

## 2018-04-05 DIAGNOSIS — I110 Hypertensive heart disease with heart failure: Secondary | ICD-10-CM | POA: Diagnosis not present

## 2018-04-05 DIAGNOSIS — K029 Dental caries, unspecified: Secondary | ICD-10-CM

## 2018-04-05 DIAGNOSIS — F419 Anxiety disorder, unspecified: Secondary | ICD-10-CM | POA: Insufficient documentation

## 2018-04-05 DIAGNOSIS — K0530 Chronic periodontitis, unspecified: Secondary | ICD-10-CM

## 2018-04-05 DIAGNOSIS — I5022 Chronic systolic (congestive) heart failure: Secondary | ICD-10-CM | POA: Diagnosis not present

## 2018-04-05 DIAGNOSIS — K083 Retained dental root: Secondary | ICD-10-CM | POA: Insufficient documentation

## 2018-04-05 DIAGNOSIS — Z7982 Long term (current) use of aspirin: Secondary | ICD-10-CM | POA: Diagnosis not present

## 2018-04-05 DIAGNOSIS — I350 Nonrheumatic aortic (valve) stenosis: Secondary | ICD-10-CM | POA: Diagnosis not present

## 2018-04-05 DIAGNOSIS — Z79899 Other long term (current) drug therapy: Secondary | ICD-10-CM | POA: Diagnosis not present

## 2018-04-05 DIAGNOSIS — Z87891 Personal history of nicotine dependence: Secondary | ICD-10-CM | POA: Diagnosis not present

## 2018-04-05 DIAGNOSIS — Q231 Congenital insufficiency of aortic valve: Secondary | ICD-10-CM | POA: Diagnosis not present

## 2018-04-05 DIAGNOSIS — K045 Chronic apical periodontitis: Secondary | ICD-10-CM | POA: Diagnosis present

## 2018-04-05 DIAGNOSIS — K036 Deposits [accretions] on teeth: Secondary | ICD-10-CM

## 2018-04-05 DIAGNOSIS — K0889 Other specified disorders of teeth and supporting structures: Secondary | ICD-10-CM | POA: Insufficient documentation

## 2018-04-05 DIAGNOSIS — I2510 Atherosclerotic heart disease of native coronary artery without angina pectoris: Secondary | ICD-10-CM | POA: Insufficient documentation

## 2018-04-05 DIAGNOSIS — E559 Vitamin D deficiency, unspecified: Secondary | ICD-10-CM | POA: Insufficient documentation

## 2018-04-05 HISTORY — PX: MULTIPLE EXTRACTIONS WITH ALVEOLOPLASTY: SHX5342

## 2018-04-05 SURGERY — MULTIPLE EXTRACTION WITH ALVEOLOPLASTY
Anesthesia: General | Site: Mouth

## 2018-04-05 MED ORDER — ETOMIDATE 2 MG/ML IV SOLN
INTRAVENOUS | Status: DC | PRN
  Administered 2018-04-05: 14 mg via INTRAVENOUS

## 2018-04-05 MED ORDER — HYDROCODONE-ACETAMINOPHEN 5-325 MG PO TABS: 1.0000 | ORAL_TABLET | ORAL | 0 refills | Status: DC | PRN

## 2018-04-05 MED ORDER — SODIUM CHLORIDE 0.9 % IV SOLN
INTRAVENOUS | Status: DC | PRN
  Administered 2018-04-05: 30 ug/min via INTRAVENOUS

## 2018-04-05 MED ORDER — LIDOCAINE 2% (20 MG/ML) 5 ML SYRINGE
INTRAMUSCULAR | Status: AC
  Filled 2018-04-05: qty 5

## 2018-04-05 MED ORDER — LIDOCAINE-EPINEPHRINE 2 %-1:100000 IJ SOLN
INTRAMUSCULAR | Status: DC | PRN
  Administered 2018-04-05: 8.5 mL via INTRADERMAL

## 2018-04-05 MED ORDER — ONDANSETRON HCL 4 MG/2ML IJ SOLN
INTRAMUSCULAR | Status: AC
  Filled 2018-04-05: qty 2

## 2018-04-05 MED ORDER — LIDOCAINE 2% (20 MG/ML) 5 ML SYRINGE
INTRAMUSCULAR | Status: DC | PRN
  Administered 2018-04-05: 100 mg via INTRAVENOUS

## 2018-04-05 MED ORDER — SUGAMMADEX SODIUM 200 MG/2ML IV SOLN
INTRAVENOUS | Status: DC | PRN
  Administered 2018-04-05: 200 mg via INTRAVENOUS

## 2018-04-05 MED ORDER — LIDOCAINE-EPINEPHRINE 2 %-1:100000 IJ SOLN
INTRAMUSCULAR | Status: AC
  Filled 2018-04-05: qty 10.2

## 2018-04-05 MED ORDER — FENTANYL CITRATE (PF) 100 MCG/2ML IJ SOLN
INTRAMUSCULAR | Status: DC | PRN
  Administered 2018-04-05: 50 ug via INTRAVENOUS

## 2018-04-05 MED ORDER — BUPIVACAINE-EPINEPHRINE (PF) 0.5% -1:200000 IJ SOLN
INTRAMUSCULAR | Status: AC
  Filled 2018-04-05: qty 3.6

## 2018-04-05 MED ORDER — PHENYLEPHRINE 40 MCG/ML (10ML) SYRINGE FOR IV PUSH (FOR BLOOD PRESSURE SUPPORT)
PREFILLED_SYRINGE | INTRAVENOUS | Status: AC
  Filled 2018-04-05: qty 10

## 2018-04-05 MED ORDER — ONDANSETRON HCL 4 MG/2ML IJ SOLN
INTRAMUSCULAR | Status: DC | PRN
  Administered 2018-04-05: 4 mg via INTRAVENOUS

## 2018-04-05 MED ORDER — BUPIVACAINE-EPINEPHRINE 0.5% -1:200000 IJ SOLN
INTRAMUSCULAR | Status: DC | PRN
  Administered 2018-04-05: 3.4 mL

## 2018-04-05 MED ORDER — HEMOSTATIC AGENTS (NO CHARGE) OPTIME
TOPICAL | Status: DC | PRN
  Administered 2018-04-05: 1 via TOPICAL

## 2018-04-05 MED ORDER — FENTANYL CITRATE (PF) 250 MCG/5ML IJ SOLN
INTRAMUSCULAR | Status: AC
  Filled 2018-04-05: qty 5

## 2018-04-05 MED ORDER — PROPOFOL 10 MG/ML IV BOLUS
INTRAVENOUS | Status: AC
  Filled 2018-04-05: qty 20

## 2018-04-05 MED ORDER — MIDAZOLAM HCL 2 MG/2ML IJ SOLN
INTRAMUSCULAR | Status: AC
  Filled 2018-04-05: qty 2

## 2018-04-05 MED ORDER — EPHEDRINE 5 MG/ML INJ
INTRAVENOUS | Status: AC
  Filled 2018-04-05: qty 10

## 2018-04-05 MED ORDER — DEXAMETHASONE SODIUM PHOSPHATE 10 MG/ML IJ SOLN
INTRAMUSCULAR | Status: DC | PRN
  Administered 2018-04-05: 5 mg via INTRAVENOUS

## 2018-04-05 MED ORDER — SUCCINYLCHOLINE CHLORIDE 200 MG/10ML IV SOSY
PREFILLED_SYRINGE | INTRAVENOUS | Status: AC
  Filled 2018-04-05: qty 10

## 2018-04-05 MED ORDER — MIDAZOLAM HCL 2 MG/2ML IJ SOLN
INTRAMUSCULAR | Status: DC | PRN
  Administered 2018-04-05: 2 mg via INTRAVENOUS

## 2018-04-05 MED ORDER — LACTATED RINGERS IV SOLN
INTRAVENOUS | Status: DC | PRN
  Administered 2018-04-05: 08:00:00 via INTRAVENOUS

## 2018-04-05 MED ORDER — DEXAMETHASONE SODIUM PHOSPHATE 10 MG/ML IJ SOLN
INTRAMUSCULAR | Status: AC
  Filled 2018-04-05: qty 1

## 2018-04-05 MED ORDER — ROCURONIUM BROMIDE 50 MG/5ML IV SOSY
PREFILLED_SYRINGE | INTRAVENOUS | Status: AC
  Filled 2018-04-05: qty 10

## 2018-04-05 MED ORDER — OXYMETAZOLINE HCL 0.05 % NA SOLN
NASAL | Status: AC
  Filled 2018-04-05: qty 30

## 2018-04-05 MED ORDER — ROCURONIUM BROMIDE 10 MG/ML (PF) SYRINGE
PREFILLED_SYRINGE | INTRAVENOUS | Status: DC | PRN
  Administered 2018-04-05: 70 mg via INTRAVENOUS

## 2018-04-05 MED ORDER — CEFAZOLIN SODIUM-DEXTROSE 2-4 GM/100ML-% IV SOLN
2.0000 g | Freq: Once | INTRAVENOUS | Status: AC
  Administered 2018-04-05: 2 g via INTRAVENOUS
  Filled 2018-04-05: qty 100

## 2018-04-05 MED ORDER — 0.9 % SODIUM CHLORIDE (POUR BTL) OPTIME
TOPICAL | Status: DC | PRN
  Administered 2018-04-05: 1000 mL

## 2018-04-05 SURGICAL SUPPLY — 42 items
ALCOHOL 70% 16 OZ (MISCELLANEOUS) ×3 IMPLANT
ATTRACTOMAT 16X20 MAGNETIC DRP (DRAPES) ×3 IMPLANT
BANDAGE HEMOSTAT MRDH 4X4 STRL (MISCELLANEOUS) IMPLANT
BLADE SURG 15 STRL LF DISP TIS (BLADE) ×2 IMPLANT
BLADE SURG 15 STRL SS (BLADE) ×6
BNDG HEMOSTAT MRDH 4X4 STRL (MISCELLANEOUS)
COVER SURGICAL LIGHT HANDLE (MISCELLANEOUS) ×1 IMPLANT
COVER WAND RF STERILE (DRAPES) ×1 IMPLANT
GAUZE 4X4 16PLY RFD (DISPOSABLE) ×3 IMPLANT
GAUZE PACKING FOLDED 2  STR (GAUZE/BANDAGES/DRESSINGS) ×2
GAUZE PACKING FOLDED 2 STR (GAUZE/BANDAGES/DRESSINGS) ×1 IMPLANT
GLOVE BIO SURGEON STRL SZ 6.5 (GLOVE) ×2 IMPLANT
GLOVE BIO SURGEONS STRL SZ 6.5 (GLOVE) ×1
GLOVE SURG ORTHO 8.0 STRL STRW (GLOVE) ×3 IMPLANT
GOWN STRL REUS W/ TWL LRG LVL3 (GOWN DISPOSABLE) ×1 IMPLANT
GOWN STRL REUS W/TWL 2XL LVL3 (GOWN DISPOSABLE) ×3 IMPLANT
GOWN STRL REUS W/TWL LRG LVL3 (GOWN DISPOSABLE) ×3
HEMOSTAT SURGICEL 2X14 (HEMOSTASIS) IMPLANT
KIT BASIN OR (CUSTOM PROCEDURE TRAY) ×3 IMPLANT
KIT TURNOVER KIT B (KITS) ×3 IMPLANT
MANIFOLD NEPTUNE WASTE (CANNULA) ×3 IMPLANT
NDL BLUNT 16X1.5 OR ONLY (NEEDLE) IMPLANT
NDL DENTAL 27 LONG (NEEDLE) IMPLANT
NEEDLE BLUNT 16X1.5 OR ONLY (NEEDLE) ×3 IMPLANT
NEEDLE DENTAL 27 LONG (NEEDLE) ×6 IMPLANT
NS IRRIG 1000ML POUR BTL (IV SOLUTION) ×3 IMPLANT
PACK EENT II TURBAN DRAPE (CUSTOM PROCEDURE TRAY) ×3 IMPLANT
PAD ARMBOARD 7.5X6 YLW CONV (MISCELLANEOUS) ×7 IMPLANT
SPONGE SURGIFOAM ABS GEL 100 (HEMOSTASIS) ×2 IMPLANT
SPONGE SURGIFOAM ABS GEL 12-7 (HEMOSTASIS) IMPLANT
SPONGE SURGIFOAM ABS GEL SZ50 (HEMOSTASIS) IMPLANT
SUCTION FRAZIER HANDLE 10FR (MISCELLANEOUS) ×2
SUCTION TUBE FRAZIER 10FR DISP (MISCELLANEOUS) ×1 IMPLANT
SUT CHROMIC 3 0 PS 2 (SUTURE) ×6 IMPLANT
SUT CHROMIC 4 0 P 3 18 (SUTURE) ×4 IMPLANT
SYR 50ML SLIP (SYRINGE) ×3 IMPLANT
TOWEL NATURAL 10PK STERILE (DISPOSABLE) ×3 IMPLANT
TUBE CONNECTING 12'X1/4 (SUCTIONS) ×1
TUBE CONNECTING 12X1/4 (SUCTIONS) ×2 IMPLANT
WATER STERILE IRR 1000ML POUR (IV SOLUTION) ×3 IMPLANT
WATER TABLETS ICX (MISCELLANEOUS) ×3 IMPLANT
YANKAUER SUCT BULB TIP NO VENT (SUCTIONS) ×3 IMPLANT

## 2018-04-05 NOTE — Progress Notes (Signed)
PRE-OPERATIVE NOTE:  04/05/2018 Tyler Berger [id number]  VITALS: BP 102/61   Pulse 63   Temp 97.7 F (36.5 C) (Oral)   Resp 18   SpO2 100%   Lab Results  Component Value Date   WBC 7.4 04/04/2018   HGB 17.2 (H) 04/04/2018   HCT 51.5 04/04/2018   MCV 88.3 04/04/2018   PLT 283 04/04/2018   BMET    Component Value Date/Time   NA 133 (L) 04/04/2018 1350   NA 140 03/27/2018 1527   K 5.0 04/04/2018 1350   CL 105 04/04/2018 1350   CO2 15 (L) 04/04/2018 1350   GLUCOSE 110 (H) 04/04/2018 1350   BUN 28 (H) 04/04/2018 1350   BUN 20 03/27/2018 1527   CREATININE 1.00 04/04/2018 1350   CREATININE 0.93 12/11/2017 1154   CALCIUM 9.4 04/04/2018 1350   GFRNONAA >60 04/04/2018 1350   GFRNONAA 90 12/11/2017 1154   GFRAA >60 04/04/2018 1350   GFRAA 105 12/11/2017 1154    Lab Results  Component Value Date   INR 0.9 04/04/2018   INR 1.0 12/11/2017   INR 1.0 08/17/2006   No results found for: PTT   Tyler Berger presents for multiple dental extractions with alveoloplasty and gross debridement of remaining dentition in the operating room with general anesthesia.   SUBJECTIVE: The patient denies any acute medical or dental changes and agrees to proceed with treatment as planned.  EXAM: No sign of acute dental changes.  ASSESSMENT: Patient is affected by multiple retained root segments, dental caries, chronic periodontitis, accretions, and loose teeth.  PLAN: Patient agrees to proceed with treatment as planned in the operating room as previously discussed and accepts the risks, benefits, and complications of the proposed treatment. Patient is aware of the risk for bleeding, bruising, swelling, infection, pain, nerve damage, soft tissue damage, damage to adjacent teeth, sinus involvement, root tip fracture, mandible fracture, and the risks of complications associated with the anesthesia. Patient also is aware of the potential for other complications up to and including death due to  his overall cardiovascular and respiratory compromise.     Lenn Cal, DDS

## 2018-04-05 NOTE — Transfer of Care (Signed)
Immediate Anesthesia Transfer of Care Note  Patient: Tyler Berger  Procedure(s) Performed: Extraction of tooth #'s 3, 12, 14,19, 23, 31 and 32 with alveoloplasty and gross debridement of remaining teeth (N/A Mouth)  Patient Location: PACU  Anesthesia Type:General  Level of Consciousness: awake, alert  and oriented  Airway & Oxygen Therapy: Patient Spontanous Breathing and Patient connected to nasal cannula oxygen  Post-op Assessment: Report given to RN, Post -op Vital signs reviewed and stable and Patient moving all extremities  Post vital signs: Reviewed and stable  Last Vitals:  Vitals Value Taken Time  BP 83/45 04/05/2018  9:42 AM  Temp    Pulse 81 04/05/2018  9:45 AM  Resp 19 04/05/2018  9:45 AM  SpO2 92 % 04/05/2018  9:45 AM  Vitals shown include unvalidated device data.  Last Pain:  Vitals:   04/05/18 0634  TempSrc:   PainSc: 0-No pain         Complications: No apparent anesthesia complications hypotensive- giving IVF

## 2018-04-05 NOTE — Anesthesia Postprocedure Evaluation (Signed)
Anesthesia Post Note  Patient: Tyler Berger  Procedure(s) Performed: Extraction of tooth #'s 3, 12, 14,19, 23, 31 and 32 with alveoloplasty and gross debridement of remaining teeth (N/A Mouth)     Patient location during evaluation: PACU Anesthesia Type: General Level of consciousness: awake and alert Pain management: pain level controlled Vital Signs Assessment: post-procedure vital signs reviewed and stable Respiratory status: spontaneous breathing, nonlabored ventilation, respiratory function stable and patient connected to nasal cannula oxygen Cardiovascular status: blood pressure returned to baseline and stable Postop Assessment: no apparent nausea or vomiting Anesthetic complications: no    Last Vitals:  Vitals:   04/05/18 1048 04/05/18 1049  BP:  (!) 92/50  Pulse: 64 64  Resp: 16 20  Temp: 36.5 C   SpO2: 92% 93%    Last Pain:  Vitals:   04/05/18 1048  TempSrc:   PainSc: 0-No pain                 Effie Berkshire

## 2018-04-05 NOTE — Progress Notes (Signed)
Received pt from OR with Low BP. Pt given additional  IVF and Dr Smith Robert notified. MD at bedside and is okay with MAP>60.

## 2018-04-05 NOTE — Anesthesia Procedure Notes (Signed)
Arterial Line Insertion Start/End2/27/2020 7:20 AM, 04/05/2018 7:37 AM Performed by: Josephine Igo, CRNA, CRNA  Patient location: Pre-op. Preanesthetic checklist: patient identified, IV checked, risks and benefits discussed and surgical consent Lidocaine 1% used for infiltration and patient sedated Right, radial was placed Catheter size: 20 G Hand hygiene performed  and maximum sterile barriers used   Attempts: 2 Procedure performed without using ultrasound guided technique. Following insertion, dressing applied and Biopatch. Post procedure assessment: normal  Patient tolerated the procedure well with no immediate complications.

## 2018-04-05 NOTE — Discharge Instructions (Signed)

## 2018-04-05 NOTE — Anesthesia Preprocedure Evaluation (Addendum)
Anesthesia Evaluation  Patient identified by MRN, date of birth, ID band Patient awake    Reviewed: Allergy & Precautions, NPO status , Patient's Chart, lab work & pertinent test results  Airway Mallampati: I  TM Distance: >3 FB Neck ROM: Full    Dental  (+) Teeth Intact, Dental Advisory Given, Poor Dentition   Pulmonary former smoker,    breath sounds clear to auscultation       Cardiovascular hypertension, + CAD and +CHF  + dysrhythmias  Rhythm:Regular Rate:Normal + Systolic murmurs    Neuro/Psych Anxiety    GI/Hepatic negative GI ROS, Neg liver ROS,   Endo/Other  negative endocrine ROS  Renal/GU negative Renal ROS     Musculoskeletal negative musculoskeletal ROS (+)   Abdominal Normal abdominal exam  (+)   Peds  Hematology negative hematology ROS (+)   Anesthesia Other Findings   Reproductive/Obstetrics                            Lab Results  Component Value Date   WBC 7.4 04/04/2018   HGB 17.2 (H) 04/04/2018   HCT 51.5 04/04/2018   MCV 88.3 04/04/2018   PLT 283 04/04/2018   Lab Results  Component Value Date   CREATININE 1.00 04/04/2018   BUN 28 (H) 04/04/2018   NA 133 (L) 04/04/2018   K 5.0 04/04/2018   CL 105 04/04/2018   CO2 15 (L) 04/04/2018   Lab Results  Component Value Date   INR 0.9 04/04/2018   INR 1.0 12/11/2017   INR 1.0 08/17/2006   EKG: normal sinus rhythm, LBBB.  Echo:  1. The left ventricle has severely reduced systolic function, with an ejection fraction of 20-25%. The cavity size was mildly dilated. There is mildly increased left ventricular wall thickness. Diastolic function not assessed, limited echo. Left  ventricular diffuse hypokinesis.  2. Mild calcification of the mitral valve leaflet. No evidence of mitral valve stenosis. Mild mitral regurgitation.  3. Bicuspid aortic valve by prior report, but unable to tell leaflet number on this study. There  is Severe calcifcation of the aortic valve with some mobility to the calcification. Aortic valve regurgitation is trivial by color flow Doppler. Low  gradient, severe aortic stenosis with mean gradient 35 mmHg and AVA 0.82 cm^2.  4. There is mild dilatation of the aortic root measuring 42 mm.  5. The inferior vena cava was dilated in size with >50% respiratory variability.  6. The tricuspid valve was normal in structure.  7. Limited echo to assess aortic valve.  Anesthesia Physical Anesthesia Plan  ASA: IV  Anesthesia Plan: General   Post-op Pain Management:    Induction: Intravenous  PONV Risk Score and Plan: 3 and Dexamethasone, Ondansetron and Midazolam  Airway Management Planned: Nasal ETT  Additional Equipment: Arterial line  Intra-op Plan:   Post-operative Plan: Extubation in OR  Informed Consent: I have reviewed the patients History and Physical, chart, labs and discussed the procedure including the risks, benefits and alternatives for the proposed anesthesia with the patient or authorized representative who has indicated his/her understanding and acceptance.     Dental advisory given  Plan Discussed with: CRNA  Anesthesia Plan Comments:        Anesthesia Quick Evaluation

## 2018-04-05 NOTE — Op Note (Signed)
OPERATIVE REPORT  Patient:            Tyler Berger Date of Birth:  April 04, 1960 MRN:                [id number]   DATE OF PROCEDURE:  04/05/2018  PREOPERATIVE DIAGNOSES: 1.  Severe aortic stenosis 2.  Dilated aortic root 3.  Pre-Bentall procedure dental protocol 4.  Chronic apical periodontitis 5.  Retained root segments 6.  Dental caries 7.  Chronic periodontitis 8.  Accretions 9.  Loose teeth  POSTOPERATIVE DIAGNOSES: 1.  Severe aortic stenosis 2.  Dilated aortic root 3.  Pre-Bentall procedure dental protocol 4.  Chronic apical periodontitis 5.  Retained root segments 6.  Dental caries 7.  Chronic periodontitis 8.  Accretions 9.  Loose teeth  OPERATIONS: 1. Multiple extraction of tooth numbers 3, 12, 14, 19, 23, 31, and 32 2. 2 Quadrants of alveoloplasty 3. Gross debridement of remaining dentition   SURGEON: Lenn Cal, DDS  ASSISTANT: Molli Posey (dental assistant)  ANESTHESIA: General anesthesia via nasoendotracheal tube.  MEDICATIONS: 1. Ancef 2 g IV prior to invasive dental procedures. 2. Local anesthesia with a total utilization of 5 carpules each containing 34 mg of lidocaine with 0.017 mg of epinephrine as well as 2 carpules each containing 9 mg of bupivacaine with 0.009 mg of epinephrine.  SPECIMENS: There are 7 teeth that were discarded.  DRAINS: None  CULTURES: None  COMPLICATIONS: None  ESTIMATED BLOOD LOSS: 75 mLs.  INTRAVENOUS FLUIDS: Lactated ringers solution per anesthesia report  INDICATIONS: The patient was recently diagnosed with severe aortic stenosis and dilated aortic root.  A medically necessary dental consultation was then requested to evaluate poor dentition.  The patient was examined and treatment planned for multiple extractions with alveoloplasty and gross to being remaining dentition in the operating room with general anesthesia.  This treatment plan was formulated to decrease the risks and complications associated  with dental infection from affecting the patient's systemic health and anticipated heart surgery.  OPERATIVE FINDINGS: Patient was examined operating room number 20.  The teeth were identified for extraction. The patient was noted be affected by chronic periodontitis, accretions, loose teeth, dental caries, chronic apical periodontitis, and retained root segments.   DESCRIPTION OF PROCEDURE: Patient was brought to the main operating room number 20. Patient was then placed in the supine position on the operating table.  General anesthesia was then induced per the anesthesia team. The patient was then prepped and draped in the usual manner for a dental medicine procedure. A timeout was performed. The patient was identified and procedures were verified. A throat pack was placed at this time. The oral cavity was then thoroughly examined with the findings noted above. The patient was then ready for dental medicine procedure as follows:  Local anesthesia was then administered sequentially with a total utilization of 5 carpules each containing 34 mg of lidocaine with 0.017 mg of epinephrine as well as 2 carpules  each containing 9 mg bupivacaine with 0.009 mg of epinephrine.  The Maxillary left and right quadrants first approached. Anesthesia was then delivered utilizing infiltration with lidocaine with epinephrine.  The retained root area #3 was then removed with a rongeurs without complications.  Tooth #12 and 14 were then subluxated with a series straight elevators.  Tooth numbers 12 and 14 were then removed with a 150 forceps without complications.  The sockets were curetted and compressed appropriately.  The surgical site was then irrigated with copious amounts sterile  saline.  A piece of Surgifoam was placed in the extraction socket of tooth numbers 12 and 14 at this time.  The surgical sites were then closed utilizing figure-of-eight suture techniques and 3-0 Chromic Gut material in area of tooth #14 and  12 appropriately.     At this point time, the mandibular quadrants were approached. The patient was given bilateral inferior alveolar nerve blocks and long buccal nerve blocks utilizing the bupivacaine with epinephrine. Further infiltration was then achieved utilizing the lidocaine with epinephrine. A 15 blade incision was then made from the distal of number 32 and extended to the mesial of #30. A surgical flap was then carefully reflected. The retained roots in the area of tooth numbers 31 and 32 were then elevated out with a series of Cryer's elevators.  Alveoloplasty was then performed utilizing rongeurs and bone file to help achieve primary closure.  The tissues were approximated and trimmed appropriately.  The surgical site was irrigated with copious amounts of sterile saline.  A piece of Surgifoam was placed in each extraction socket appropriately.  The surgical site was then closed from the distal #32 and extended the distal #30 utilizing 3-0 Chromic Gut suture in a continuous interrupted suture technique x1.    At this point time tooth #23 was approached and removed with a 151 forceps without complication.  The socket was curetted and compressed appropriately.  Surgical site was irrigated with copious amounts sterile saline.  A piece of Surgifoam placed the extraction socket.  Surgical site was then closed utilizing 1 figure-of-eight suture and 3-0 Chromic Gut material.    At this point in time, tooth #19 was approached and removed with a 23 forceps.  A surgical flap was then elevated on the buccal and lingual aspects.  Alveoloplasty was then performed utilizing a rongeurs and bone file.  The surgical site was irrigated with copious amounts sterile saline.  A piece of Surgifoam was placed in the extraction socket of tooth #19.  The surgical site was then closed from the mesial #18 and extended the distal of #20 utilizing 3-0 Chromic Gut suture in a continuous erupted suture technique x1.  At this  point time the remaining dentition was approached.  A Cavitron was used to remove significant accretions.  A series of hand curettes were used to further remove accretions.  The Cavitron was then again used to further refine the removal of accretions.  This completed the gross debridement procedure.    At this point in time, the entire mouth was irrigated with copious amounts of sterile saline. The patient was examined for complications, seeing none, the dental medicine procedure was deemed to be complete. The throat pack was removed at this time. An oral airway was then placed at the request of the anesthesia team. A series of 4 x 4 gauze were placed in the mouth to aid hemostasis. The patient was then handed over to the anesthesia team for final disposition. After an appropriate amount of time, the patient was extubated and taken to the postanesthsia care unit in good condition. All counts were correct for the dental medicine procedure.   Lenn Cal, DDS.

## 2018-04-05 NOTE — H&P (Signed)
04/05/2018  Patient:            Tyler Berger Date of Birth:  1960/03/10 MRN:                [id number]   BP 102/61   Pulse 63   Temp 97.7 F (36.5 C) (Oral)   Resp 18   SpO2 100%    Tyler Berger is a 58 year old male recently diagnosed severe aortic stenosis, dilated aortic root, and reduced ejection fraction.  Patient with anticipated Bentall procedure.  Patient now presents for multiple extractions with alveoloplasty and gross debridement of remaining dentition in the operating room with general anesthesia.  Patient denies any acute medical or dental changes.  Please see note from Dr. Roxy Manns dated 04/02/2018 to act as H&P for the dental operating room procedure.  Lenn Cal, DDS      MULTIDISCIPLINARY HEART VALVE CLINIC  CARDIOTHORACIC SURGERY CONSULTATION REPORT  Referring Provider is Larey Dresser, MD Primary Cardiologist is Minus Breeding, MD PCP is Unk Pinto, MD      Chief Complaint  Patient presents with  . Aortic Stenosis    eval for AVR...ECHO 03/02/18. 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  Patient's cardiac history dates back to 2004 when he was hospitalized with symptoms of congestive heart failure.  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  He was noted to have a bicuspid aortic valve with what was felt to be mild to moderate aortic  stenosis at that time.  He was seen in follow-up in 2008 at which time reportedly left ventricular systolic function had recovered with ejection fraction estimated 60 to 65%.  There was moderate aortic stenosis with mean transvalvular gradient reported 23 mmHg at that time.  Stress perfusion study performed at that time was felt to be low risk.  He was lost to medical follow-up until recently.  Over the past 4 to 6 months the patient has developed progressive symptoms of exertional shortness of breath and fatigue.  In early January he was evaluated in the emergency department with an episode of supraventricular tachycardia.  He was treated and released and has subsequently been followed carefully by Dr. Percival Spanish.  Transthoracic echocardiogram performed March 02, 2018 revealed severe left ventricular systolic dysfunction with ejection fraction estimated 25 to 30%.  There was bicuspid aortic valve with severe low gradient, low ejection fraction aortic stenosis.  Peak velocity across the aortic valve was measured 3.6 m/s corresponding to mean transvalvular gradient estimated 33 mmHg.  The DVI was 0.2 with aortic valve area calculated 0.68 cm.  He was scheduled for elective diagnostic cardiac catheterization which was performed March 28, 2018.  The patient was found to have mild nonobstructive coronary artery disease.  The aortic valve was not crossed.  There was moderate to severe pulmonary hypertension with PA pressures measured 70/32 with pulmonary capillary wedge pressure of 30, mixed venous oxygen saturation 61%, and cardiac index only 1.72 L/min/m.  The patient was hospitalized for acute exacerbation  of chronic systolic congestive heart failure.  He improved from a symptomatic standpoint with medical therapy for heart failure.  He was seen in consultation by Dr. Angelena Form from the multidisciplinary heart valve team.  CT angiography was performed and the patient has been referred for elective surgical  consultation.  Patient is married and lives locally in [place] with his partner and husband.  He has 2 adult children, 1 of whom accompanies him for his office consultation today.  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  Although he does not exercise on a regular basis he has remained reasonably active physically and functionally independent until recently.  He reports that his only functional limitation is that of progressive exertional shortness of breath and fatigue, all of which seem to begin last fall.  Symptoms slowly progressed and have been associated with a persistent cough.  He had 1 syncopal episode last fall for which he did not seek medical attention.  He has never had any chest pain or chest tightness either with activity or at rest.  He has had some lower extremity edema.  He has not experienced PND or orthopnea.  Symptoms have improved since his recent hospitalization.  Since hospital discharge weight has been stable.  He still gets short of breath with activity but not quite as bad as it had been previously.  He states that he quit smoking approximately 1 week ago.  He drinks alcohol on a daily basis.        Past Medical History:  Diagnosis Date  . Anxiety   . Aortic stenosis   . CAD (coronary artery disease)   . Dilated aortic root ([place])   . Hypertension   . Nonischemic cardiomyopathy ([place])   . SVT (supraventricular tachycardia) ([place])   . Viral myocarditis 2004  . Vitamin D deficiency          Past Surgical History:  Procedure Laterality Date  . HERNIA REPAIR Left 2010  . ORCHIECTOMY Left 1982  . RIGHT HEART CATH AND CORONARY ANGIOGRAPHY N/A 03/28/2018   Procedure: RIGHT HEART CATH AND CORONARY ANGIOGRAPHY;  Surgeon: Larey Dresser, MD;  Location: [facility];  Service: Cardiovascular;  Laterality: N/A;  . VARICOSE VEIN SURGERY Left 05/1998         Family History  Problem Relation Age of Onset  . Diabetes  Mother   . Hypertension Mother   . Cancer Mother        breast  . Alzheimer's disease Mother   . Hypertension Father   . Cancer Father        head and neck  . Hypertension Sister     Social History        Socioeconomic History  . Marital status: Married    Spouse name: Not on file  . Number of children: 2  . Years of education: Not on file  . Highest education level: Not on file  Occupational History  . 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 Financial resource strain: Not on file  . Food insecurity:    Worry: Not on file    Inability: Not on file  . Transportation needs:    Medical: Not on file    Non-medical: Not on file  Tobacco Use  . Smoking status: Current Every Day Smoker    Packs/day: 0.75    Years: 30.00    Pack years: 22.50    Types: Cigarettes  . Smokeless tobacco:  Never Used  Substance and Sexual Activity  . Alcohol use: Yes    Alcohol/week: 2.0 standard drinks    Types: 2 Glasses of wine per week  . Drug use: Never  . Sexual activity: Not on file  Lifestyle  . Physical activity:    Days per week: Not on file    Minutes per session: Not on file  . Stress: Not on file  Relationships  . Social connections:    Talks on phone: Not on file    Gets together: Not on file    Attends religious service: Not on file    Active member of club or organization: Not on file    Attends meetings of clubs or organizations: Not on file    Relationship status: Not on file  . Intimate partner violence:    Fear of current or ex partner: Not on file    Emotionally abused: Not on file    Physically abused: Not on file    Forced sexual activity: Not on file  Other Topics Concern  . Not on file  Social History Narrative   Married, med Designer, multimedia.  Two children.            Current Outpatient Medications  Medication Sig Dispense Refill  . acetaminophen (TYLENOL) 500 MG tablet Take  1,000 mg by mouth every 6 (six) hours as needed for mild pain or headache.    . ALPRAZolam (XANAX) 0.5 MG tablet Take 1 tablet (0.5 mg total) by mouth at bedtime as needed for anxiety. (Patient taking differently: Take 0.25 mg by mouth at bedtime. ) 30 tablet 1  . aspirin 81 MG chewable tablet Chew 81 mg by mouth daily.     . busPIRone (BUSPAR) 10 MG tablet Take 1 tablet (10 mg total) by mouth 3 (three) times daily. (Patient taking differently: Take 10 mg by mouth at bedtime. ) 120 tablet 0  . Cholecalciferol (VITAMIN D PO) Take 4 capsules by mouth daily.     . digoxin (LANOXIN) 0.125 MG tablet Take 1 tablet (0.125 mg total) by mouth daily. 30 tablet 6  . metoprolol succinate (TOPROL-XL) 50 MG 24 hr tablet Take 1 tablet (50 mg total) by mouth 2 (two) times daily. Take with or immediately following a meal. 60 tablet 6  . potassium chloride SA (K-DUR,KLOR-CON) 20 MEQ tablet Take 1 tablet (20 mEq total) by mouth 2 (two) times daily. 30 tablet 6  . rosuvastatin (CRESTOR) 10 MG tablet Take 1 tablet (10 mg total) by mouth daily at 6 PM. 30 tablet 6  . sacubitril-valsartan (ENTRESTO) 24-26 MG Take 1 tablet by mouth 2 (two) times daily. 60 tablet 6  . sertraline (ZOLOFT) 100 MG tablet Take 1 tablet (100 mg total) by mouth daily. (Patient taking differently: Take 100 mg by mouth at bedtime. ) 60 tablet 0  . spironolactone (ALDACTONE) 25 MG tablet Take 0.5 tablets (12.5 mg total) by mouth daily. 30 tablet 6  . torsemide (DEMADEX) 20 MG tablet Take 2 tablets (40 mg total) by mouth daily. 60 tablet 6  . fluticasone furoate-vilanterol (BREO ELLIPTA) 100-25 MCG/INH AEPB Inhale 1 puff into the lungs daily. Rinse mouth with water after each use (Patient not taking: Reported on 03/26/2018) 1 each 0   No current facility-administered medications for this visit.     No Known Allergies    Review of Systems:              General:  normal appetite, decreased energy, no weight gain,  no weight loss, no fever             Cardiac:                       no chest pain with exertion, no chest pain at rest, + SOB with exertion, + resting SOB, no PND, no orthopnea, no palpitations, + arrhythmia, no atrial fibrillation, + LE edema, + dizzy spells, + syncope             Respiratory:                 + shortness of breath, no home oxygen, + productive cough, + dry cough, no bronchitis, no wheezing, no hemoptysis, no asthma, no pain with inspiration or cough, no sleep apnea, no CPAP at night             GI:                               no difficulty swallowing, no reflux, no frequent heartburn, no hiatal hernia, no abdominal pain, no constipation, no diarrhea, no hematochezia, no hematemesis, no melena             GU:                              no dysuria,  no frequency, no urinary tract infection, no hematuria, no enlarged prostate, no kidney stones, no kidney disease             Vascular:                     no pain suggestive of claudication, no pain in feet, no leg cramps, + varicose veins, no DVT, no non-healing foot ulcer             Neuro:                         no stroke, no TIA's, no seizures, no headaches, no temporary blindness one eye,  no slurred speech, no peripheral neuropathy, no chronic pain, no instability of gait, no memory/cognitive dysfunction             Musculoskeletal:         no arthritis, no joint swelling, no myalgias, no difficulty walking, normal mobility              Skin:                            no rash, no itching, no skin infections, no pressure sores or ulcerations             Psych:                         + anxiety, no depression, no nervousness, no unusual recent stress             Eyes:                           no blurry vision, no floaters, no recent vision changes, does not wear glasses or contacts             ENT:  no hearing loss, no loose or painful teeth, no dentures, last saw dentist many years ago              Hematologic:               no easy bruising, no abnormal bleeding, no clotting disorder, no frequent epistaxis             Endocrine:                   no diabetes, does not check CBG's at home                                                       Physical Exam:              BP (!) 80/60 (BP Location: Right Arm, Patient Position: Sitting, Cuff Size: Large)   Pulse 78   Resp 16   Ht 6\' 3"  (1.905 m)   Wt 221 lb (100.2 kg)   SpO2 96% Comment: ON RA  BMI 27.62 kg/m              General:                        well-appearing             HEENT:                       Unremarkable              Neck:                           no JVD, no bruits, no adenopathy              Chest:                          clear to auscultation, symmetrical breath sounds, no wheezes, no rhonchi              CV:                              RRR, grade III/VI crescendo/decrescendo murmur heard best at LLSB,  no diastolic murmur             Abdomen:                    soft, non-tender, no masses              Extremities:                 warm, well-perfused, pulses diminished but palpable, no LE edema, + varicose veins             Rectal/GU                   Deferred             Neuro:                         Grossly non-focal and symmetrical throughout             Skin:  Clean and dry, no rashes, no breakdown   Diagnostic Tests:  Transthoracic Echocardiography  Patient: Anthem, Frazer MR #: [id number] Study Date: 03/02/2018 Gender: M Age: 14 Height: 190.5 cm Weight: 108.2 kg BSA: 2.41 m^2 Pt. Status: Room:  ATTENDING Minus Breeding, MD ORDERING Minus Breeding, MD [place], MD SONOGRAPHER [place], Outpatient  cc:  ------------------------------------------------------------------- LV EF: 25% -  30%  ------------------------------------------------------------------- Indications: Q23.1 Bicuspid aortic valve.  ------------------------------------------------------------------- History: PMH: Tachycardia. Viral myocarditis. Aortic stenosis. Risk factors: Current tobacco use. Hypertension.  ------------------------------------------------------------------- Study Conclusions  - Procedure narrative: Transthoracic echocardiography. Image quality was adequate. The study was technically difficult. - Left ventricle: The cavity size was moderately dilated. Wall thickness was increased in a pattern of mild LVH. Systolic function was severely reduced. The estimated ejection fraction was in the range of 25% to 30%. Diffuse hypokinesis. Left ventricular diastolic function parameters were normal. - Aortic valve: Poorly visualized previously described as bicuspid Severe calcification and severe AS. Valve area (VTI): 0.77 cm^2. Valve area (Vmax): 0.7 cm^2. Valve area (Vmean): 0.68 cm^2. - Left atrium: The atrium was moderately dilated. - Atrial septum: No defect or patent foramen ovale was identified.  ------------------------------------------------------------------- Study data: Comparison was made to the study of 08/02/2006. Study status: Routine. Procedure: The patient reported no pain pre or post test. Transthoracic echocardiography. Image quality was adequate. The study was technically difficult. Study completion: There were no complications. Transthoracic echocardiography. M-mode, complete 2D, spectral Doppler, and color Doppler. Birthdate: Patient birthdate: 1960-07-10. Age: Patient is 58 yr old. Sex: Gender: male. BMI: 29.8 kg/m^2. Blood pressure: 106/72 Patient status: Outpatient. Study date: Study date: 03/02/2018. Study time: 09:38 AM. Location: [facility]  3  -------------------------------------------------------------------  ------------------------------------------------------------------- Left ventricle: The cavity size was moderately dilated. Wall thickness was increased in a pattern of mild LVH. Systolic function was severely reduced. The estimated ejection fraction was in the range of 25% to 30%. Diffuse hypokinesis. The transmitral flow pattern was normal. The deceleration time of the early transmitral flow velocity was normal. The pulmonary vein flow pattern was normal. The tissue Doppler parameters were normal. Left ventricular diastolic function parameters were normal.  ------------------------------------------------------------------- Aortic valve: Poorly visualized previously described as bicuspid Severe calcification and severe AS. Doppler: VTI ratio of LVOT to aortic valve: 0.22. Valve area (VTI): 0.77 cm^2. Indexed valve area (VTI): 0.32 cm^2/m^2. Peak velocity ratio of LVOT to aortic valve: 0.2. Valve area (Vmax): 0.7 cm^2. Indexed valve area (Vmax): 0.29 cm^2/m^2. Mean velocity ratio of LVOT to aortic valve: 0.2. Valve area (Vmean): 0.68 cm^2. Indexed valve area (Vmean): 0.28 cm^2/m^2. Mean gradient (S): 33 mm Hg. Peak gradient (S): 52 mm Hg.  ------------------------------------------------------------------- Aorta: The aorta was normal, not dilated, and non-diseased.  ------------------------------------------------------------------- Mitral valve: Mildly thickened leaflets . Doppler: There was trivial regurgitation.  ------------------------------------------------------------------- Left atrium: The atrium was moderately dilated.  ------------------------------------------------------------------- Atrial septum: No defect or patent foramen ovale was identified.  ------------------------------------------------------------------- Right ventricle: The cavity size was normal. Wall  thickness was normal. Systolic function was normal.  ------------------------------------------------------------------- Pulmonic valve: Doppler: There was mild regurgitation.  ------------------------------------------------------------------- Tricuspid valve: Doppler: There was mild regurgitation.  ------------------------------------------------------------------- Right atrium: The atrium was normal in size.  ------------------------------------------------------------------- Pericardium: The pericardium was normal in appearance.  ------------------------------------------------------------------- Systemic veins: Inferior vena cava: The vessel was normal in size. The respirophasic diameter changes were in the normal range (>= 50%), consistent with normal central venous pressure.  ------------------------------------------------------------------- Post procedure conclusions Ascending Aorta:  - The aorta was  normal, not dilated, and non-diseased.  ------------------------------------------------------------------- Measurements  Left ventricle Value Reference LV ID, ED, PLAX chordal (H) 57.1 mm 43 - 52 LV ID, ES, PLAX chordal (H) 46.8 mm 23 - 38 LV fx shortening, PLAX chordal (L) 18 % >=29 LV PW thickness, ED 12.7 mm ---------- IVS/LV PW ratio, ED 1.01 <=1.3 Stroke volume, 2D 62 ml ---------- Stroke volume/bsa, 2D 26 ml/m^2 ---------- LV e&', medial 5.99 cm/s ---------- Longitudinal strain, TDI 9 % ----------  Ventricular septum Value Reference IVS thickness, ED 12.8 mm ----------  LVOT  Value Reference LVOT ID, S 21 mm ---------- LVOT area 3.46 cm^2 ---------- LVOT peak velocity, S 73.7 cm/s ---------- LVOT mean velocity, S 52 cm/s ---------- LVOT VTI, S 17.8 cm ----------  Aortic valve Value Reference Aortic valve peak velocity, S 362 cm/s ---------- Aortic valve mean velocity, S 263 cm/s ---------- Aortic valve VTI, S 79.5 cm ---------- Aortic mean gradient, S 33 mm Hg ---------- Aortic peak gradient, S 52 mm Hg ---------- VTI ratio, LVOT/AV 0.22 ---------- Aortic valve area, VTI 0.77 cm^2 ---------- Aortic valve area/bsa, VTI 0.32 cm^2/m^2 ---------- Velocity ratio, peak, LVOT/AV 0.2 ---------- Aortic valve area, peak velocity 0.7 cm^2 ---------- Aortic valve area/bsa, peak 0.29 cm^2/m^2 ---------- velocity Velocity ratio, mean, LVOT/AV 0.2 ---------- Aortic valve area, mean velocity 0.68 cm^2 ---------- Aortic valve area/bsa, mean 0.28 cm^2/m^2 ---------- velocity Aortic regurg peak velocity 309 cm/s ---------- Aortic regurg pressure half-time 193 ms ---------- Aortic regurg peak gradient 38 mm Hg ----------  Aorta Value Reference Aortic root ID, ED 37 mm ----------  Left atrium Value Reference LA ID, A-P, ES 51 mm  ---------- LA ID/bsa, A-P 2.11 cm/m^2 <=2.2 LA volume, S 66.4 ml ---------- LA volume/bsa, S 27.5 ml/m^2 ---------- LA volume, ES, 1-p A4C 64.6 ml ---------- LA volume/bsa, ES, 1-p A4C 26.8 ml/m^2 ---------- LA volume, ES, 1-p A2C 64.7 ml ---------- LA volume/bsa, ES, 1-p A2C 26.8 ml/m^2 ----------  Right atrium Value Reference RA ID, S-I, ES, A4C 46.7 mm 34 - 49 RA area, ES, A4C 16.9 cm^2 8.3 - 19.5 RA volume, ES, A/L 48.7 ml ---------- RA volume/bsa, ES, A/L 20.2 ml/m^2 ----------  Right ventricle Value Reference RV s&', lateral, S 11 cm/s ----------  Legend: (L) and (H) mark values outside specified reference range.  ------------------------------------------------------------------- Prepared and Electronically Authenticated by  Jenkins Rouge, M.D. 2020-01-24T11:23:10    RIGHT HEART CATH AND CORONARY ANGIOGRAPHY  Conclusion   1. Markedly elevated PCWP.  2. Decreased cardiac output.  3. 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  4. Nonobstructive CAD.   Nonischemic cardiomyopathy. Reviewed initial echo, looks like severe aortic stenosis. With marked PCWP elevation, did not cross valve and plan to admit, diurese, and proceed with careful limited echo for AS to confirm severity (possibly TEE if needed).   Procedural Details   Technical Details Procedure: Right Heart Cath, Selective Coronary Angiography  Indication: Aortic stenosis, CHF   Procedural Details: The right brachial and radial areas were prepped, draped, and anesthetized with 1%  lidocaine. There was a pre-existing peripheral IV that was replaced with a 43F venous sheath. A Swan-Ganz catheter was used for the right heart catheterization. Standard protocol was followed for recording of right heart pressures and sampling of oxygen saturations. Fick cardiac output was calculated. The right radial artery was entered using modified Seldinger technique and a 1F sheath was placed. The patient received 3 mg IA verapamil and weight-based IV heparin. JL3.5 and AR1 were used for selective coronary angiography. There were no  immediate procedural complications. The patient was transferred to the post catheterization recovery area for further monitoring.   Estimated blood loss <50 mL.   During this procedure medications were administered to achieve and maintain moderate conscious sedation while the patient's heart rate, blood pressure, and oxygen saturation were continuously monitored and I was present face-to-face 100% of this time.  Medications  (Filter: Administrations occurring from 03/28/18 0820 to 03/28/18 0955)          Medication Rate/Dose/Volume Action  Date Time   Heparin (Porcine) in NaCl 1000-0.9 UT/500ML-% SOLN (mL) 500 mL Given 03/28/18 0836   Total dose as of 04/02/18 1128 500 mL Given 0836   1,500 mL 500 mL Given 0836   fentaNYL (SUBLIMAZE) injection (mcg) 12.5 mcg Given 03/28/18 0849   Total dose as of 04/02/18 1128 12.5 mcg Given 0859   50 mcg 25 mcg Given 0906   midazolam (VERSED) injection (mg) 0.5 mg Given 03/28/18 0849   Total dose as of 04/02/18 1128 0.5 mg Given 0859   2 mg 1 mg Given 0906   lidocaine (PF) (XYLOCAINE) 1 % injection (mL) 2 mL Given 03/28/18 0857   Total dose as of 04/02/18 1128 2 mL Given 0904   4 mL        Radial Cocktail/Verapamil only (mL) 10 mL Given 03/28/18 0915   Total dose as of 04/02/18 1128        10 mL        heparin injection (Units) 5,000 Units Given 03/28/18 0919   Total dose as of 04/02/18 1128         5,000 Units        iohexol (OMNIPAQUE) 350 MG/ML injection (mL) 135 mL Given 03/28/18 0951   Total dose as of 04/02/18 1128        135 mL        Sedation Time   Sedation Time Physician-1: 52 minutes 36 seconds  Coronary Findings   Diagnostic  Dominance: Right  Left Main  No significant disease.  Left Anterior Descending  Small D1 with 80% proximal stenosis. Moderate D2 with 50% proximal stenosis. Luminal irregularities in the LAD.  Left Circumflex  50% mid LCx stenosis.  Right Coronary Artery  50% distal RCA stenosis.  Intervention   No interventions have been documented.  Right Heart   Right Heart Pressures [facility] Procedural Findings: Hemodynamics (mmHg) RA mean 12 RV 64/17 PA 70/32, mean 47 PCWP mean 30  Oxygen saturations: PA 61% AO 97%  Cardiac Output (Fick) 4.05  Cardiac Index (Fick) 1.72 PVR 4.2 WU  Implants    No implant documentation for this case.  Syngo Images      Show images for CARDIAC CATHETERIZATION  MERGE Images   Show images for CARDIAC CATHETERIZATION   Link to Procedure Log   Procedure Log    Hemo Data    Most Recent Value  Fick Cardiac Output 4.05 L/min  Fick Cardiac Output Index 1.72 (L/min)/BSA  RA A Wave 17 mmHg  RA V Wave 19 mmHg  RA Mean 12 mmHg  RV Systolic Pressure 64 mmHg  RV Diastolic Pressure 8 mmHg  RV EDP 17 mmHg  PA Systolic Pressure 70 mmHg  PA Diastolic Pressure 32 mmHg  PA Mean 46 mmHg  PW A Wave 35 mmHg  PW V Wave 32 mmHg  PW Mean 29 mmHg  AO Systolic Pressure 89 mmHg  AO Diastolic Pressure 62 mmHg  AO Mean 75 mmHg  QP/QS 1  TPVR  Index 27.31 HRUI  TSVR Index 43.59 HRUI  PVR SVR Ratio 0.29  TPVR/TSVR Ratio 0.63    Cardiac TAVR CT  TECHNIQUE: The patient was scanned on a Siemens Force 283 slice scanner. A 120 kV retrospective scan was triggered in the descending thoracic aorta at 111 HU's. Gantry rotation speed was 270 msecs and collimation was .9 mm. No  beta blockade or nitro were given. The 3D data set was reconstructed in 5% intervals of the R-R cycle. Systolic and diastolic phases were analyzed on a dedicated work station using MPR, MIP and VRT modes. The patient received 80 cc of contrast.  FINDINGS: Aortic Valve: Bicuspid with heavy calcium fusion of right and left cusps. There is large nodular area of calcification at base of both leaflets  Aorta: Dilated root normal arch vessels no coarctation  Sinotubular Junction: 34 mm  Ascending Thoracic Aorta: 43 mm  Aortic Arch: 28 mm  Descending Thoracic Aorta: 23 mm  Sinus of Valsalva Measurements:  Non-coronary: 35.7 mm  Right - coronary: 38.2 mm  Left - coronary: 37.4 mm  Coronary Artery Height above Annulus:  Left Main: 13.8 mm above annulus  Right Coronary: 17 mm above annulus  Virtual Basal Annulus Measurements:  Maximum/Minimum Diameter: 30 mm x 36 mm  Perimeter: 113 mm  Area: 946 mm2  Coronary Arteries: Sufficient height above annulus for deployment but concern for LM as fused cusp heavily calcified and broad would appear to be at risk for occlusion  Optimum Fluoroscopic Angle for Delivery: LAO 14 mm CAudal 14 mm  IMPRESSION: 1. Heavily calcified bicuspid valve with annular area of 946 mm2 would appear to be too large for Medtronic or Sapien 3 valve. The dense opposed nodular calcium in the annulus also Would appear to increase risk of peri valvular regurgitation  2. Coronary arteries sufficient height above annulus for deployment by traditional criteria but concern for broad based fused R/L leaflet being heavily calcified and potential obstruction of LM  3. Optimum angiographic angle for deployment LAO 14 Caudal 14 degrees  4. Dilated aortic root 4.3 cm  Jenkins Rouge   Electronically Signed By: Jenkins Rouge M.D. On: 03/29/2018 16:46    CT ANGIOGRAPHY CHEST, ABDOMEN AND  PELVIS  TECHNIQUE: Multidetector CT imaging through the chest, abdomen and pelvis was performed using the standard protocol during bolus administration of intravenous contrast. Multiplanar reconstructed images and MIPs were obtained and reviewed to evaluate the vascular anatomy.  CONTRAST: 117mL ISOVUE-370 IOPAMIDOL (ISOVUE-370) INJECTION 76%  COMPARISON: CT the abdomen and pelvis 12/14/2017. Chest CT 08/17/2016.  FINDINGS: CTA CHEST FINDINGS  Cardiovascular: Heart size is mildly enlarged. There is no significant pericardial fluid, thickening or pericardial calcification. There is aortic atherosclerosis, as well as atherosclerosis of the great vessels of the mediastinum and the coronary arteries, including calcified atherosclerotic plaque in the left main, left anterior descending, left circumflex and right coronary arteries. Severe thickening calcification of the aortic valve.  Mediastinum/Lymph Nodes: No pathologically enlarged mediastinal or hilar lymph nodes. Esophagus is unremarkable in appearance. No axillary lymphadenopathy.  Lungs/Pleura: No suspicious appearing pulmonary nodules or masses are noted. No acute consolidative airspace disease. Small bilateral pleural effusions lying dependently (right greater than left).  Musculoskeletal/Soft Tissues: There are no aggressive appearing lytic or blastic lesions noted in the visualized portions of the skeleton.  CTA ABDOMEN AND PELVIS FINDINGS  Hepatobiliary: No suspicious cystic or solid hepatic lesions. No intra or extrahepatic biliary ductal dilatation. Gallbladder is normal in appearance.  Pancreas: No pancreatic mass. No  pancreatic ductal dilatation. No pancreatic or peripancreatic fluid or inflammatory changes.  Spleen: Visualized portions are unremarkable.  Adrenals/Urinary Tract: 1.7 cm low-attenuation lesion in the anterior aspect of the interpolar region of the right kidney, compatible with  a simple cyst. There are 2 intermediate to high attenuation lesions in the left kidney, measuring up to 2.2 cm in the lower pole of the left kidney, stable compared to the prior examination 12/14/2017, statistically likely to represent a proteinaceous/hemorrhagic cysts. Other subcentimeter low-attenuation lesions in the kidneys, too small to characterize, but statistically likely to represent tiny cysts. Bilateral adrenal glands are normal in appearance. No hydroureteronephrosis. Urinary bladder is normal in appearance.  Stomach/Bowel: Normal appearance of the stomach. No pathologic dilatation of small bowel or colon. Normal appendix.  Vascular/Lymphatic: Aortic atherosclerosis, without evidence of aneurysm or dissection in the abdominal or pelvic vasculature. Vascular findings and measurements pertinent to potential TAVR procedure, as detailed below. No lymphadenopathy noted in the abdomen or pelvis.  Reproductive: Prostate gland and seminal vesicles are unremarkable in appearance.  Other: No significant volume of ascites. No pneumoperitoneum.  Musculoskeletal: There are no aggressive appearing lytic or blastic lesions noted in the visualized portions of the skeleton.  VASCULAR MEASUREMENTS PERTINENT TO TAVR:  AORTA:  Minimal Aortic Diameter-17 x 16 mm  Severity of Aortic Calcification-mild-to-moderate  RIGHT PELVIS:  Right Common Iliac Artery -  Minimal Diameter-10.1 x 8.6 mm  Tortuosity-mild  Calcification-moderate  Right External Iliac Artery -  Minimal Diameter-7.4 x 5.9 mm  Tortuosity - mild  Calcification-none  Right Common Femoral Artery -  Minimal Diameter-7.8 x 6.6 mm  Tortuosity - mild  Calcification-mild  LEFT PELVIS:  Left Common Iliac Artery -  Minimal Diameter-10.2 x 8.4 mm  Tortuosity-severe  Calcification-mild  Left External Iliac Artery -  Minimal Diameter-8.0 x 7.7  mm  Tortuosity-moderate  Calcification-mild  Left Common Femoral Artery -  Minimal Diameter-7.2 x 8.0 mm  Tortuosity-mild  Calcification-mild  Review of the MIP images confirms the above findings.  IMPRESSION: 1. Vascular findings and measurements pertinent to potential TAVR procedure, as detailed above. 2. Severe thickening calcification of the aortic valve, compatible with the reported clinical history of severe aortic stenosis. 3. Aortic atherosclerosis, in addition to left main and 3 vessel coronary artery disease. Please note that although the presence of coronary artery calcium documents the presence of coronary artery disease, the severity of this disease and any potential stenosis cannot be assessed on this non-gated CT examination. Assessment for potential risk factor modification, dietary therapy or pharmacologic therapy may be warranted, if clinically indicated. 4. Small bilateral pleural effusions (right greater than left). 5. Additional incidental findings, as above.   Electronically Signed By: Vinnie Langton M.D. 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  He presents with recent acute exacerbation of chronic systolic congestive heart failure, [place] Heart Association functional class IV.  Symptoms have responded well to medical therapy.  I personally reviewed the patient's recent transthoracic echocardiograms, diagnostic cardiac catheterization, and CT angiograms.  The patient has a Sievers type I bicuspid aortic valve with fusion of the left and right cusps of the valve.  There is severe calcification involving both leaflets and the annulus itself.  Peak velocity across the aortic valve measured 3.6 m/s despite the presence of severe global left ventricular systolic  dysfunction and  ejection fraction estimated only 25%.  The DVI was reportedly quite low and aortic valve area was estimated only 0.68 cm.  Diagnostic cardiac catheterization is notable for the presence of mild nonobstructive coronary artery disease.  Cardiac output was low and pulmonary artery pressures moderate to severely elevated at the time of his recent catheterization.  He was notably an acute exacerbation of chronic heart failure at that time.  There is no question the patient needs aortic valve replacement.  CT angiography confirms the presence of aneurysmal enlargement of the entire aortic root.  The overall size of the aortic valve and aortic annulus is much too large to accommodate any of the currently commercially available transcatheter heart valves.  Moreover, there is severe annular calcification which would further increase the likelihood of significant paravalvular leak.  Finally, the patient has aneurysmal enlargement of the aortic root which would put the patient at risk of aortic dissection in the future if his aneurysm was left untreated.  Given the patient's relatively young age I feel that an aggressive approach is warranted and he should undergo Bentall aortic root replacement.  Risks associated with surgery will clearly be increased because of the severity of left ventricular systolic dysfunction.  Furthermore, the patient has longstanding history of tobacco abuse and significant alcohol use.  The patient has baseline left bundle branch block on EKG and may be at somewhat increased risk for the development of complete heart block postoperatively.  Finally, the patient has known history of supraventricular tachycardia and will likely remain at significant risk for atrial dysrhythmias early following surgery.    Plan:  The patient, his husband and his son were counseled at length regarding treatment alternatives for management of severe symptomatic aortic stenosis with aneurysmal  enlargement of the aortic root. Alternative approaches such as conventional aortic valve replacement with or without aortic root replacement, transcatheter aortic valve replacement, and continued medical therapy without intervention were compared and contrasted at length.  The risks associated with conventional surgical aortic root replacement were discussed in detail, as were expectations for post-operative convalescence.  Discussion was held comparing the relative risks of mechanical valve replacement with need for lifelong anticoagulation versus use of a bioprosthetic tissue valve and the associated potential for late structural valve deterioration and failure.  This discussion was placed in the context of the patient's particular circumstances, and as a result the patient specifically requests that their valve be replaced using a bioprosthetic tissue valve.  The patient understands and accepts all potential associated risks of surgery including but not limited to risk of death, stroke, myocardial infarction, congestive heart failure, respiratory failure, renal failure, pneumonia, bleeding requiring blood transfusion and or reexploration, arrhythmia, heart block or bradycardia requiring permanent pacemaker, aortic dissection or other major vascular complication, pleural effusions or other delayed complications related to continued congestive heart failure, and other late complications related to valve replacement including structural valve deterioration and failure, thrombosis, endocarditis, or paravalvular leak.  We tentatively plan to proceed with surgery on Tuesday April 10, 2018.  Prior to the surgery the patient will be referred for dental service consultation for routine examination and cleaning.  All of the patient's questions have been addressed.     I spent in excess of 90 minutes during the conduct of this office consultation and >50% of this time involved direct face-to-face encounter with  the patient for counseling and/or coordination of their care.      Valentina Gu. Roxy Manns, MD 04/02/2018 10:04 AM  Electronically signed by Rexene Alberts, MD at 04/02/2018 12:31 PM

## 2018-04-05 NOTE — Anesthesia Procedure Notes (Addendum)
Procedure Name: Intubation Date/Time: 04/05/2018 7:51 AM Performed by: Leonor Liv, CRNA Pre-anesthesia Checklist: Patient identified, Emergency Drugs available, Suction available and Patient being monitored Patient Re-evaluated:Patient Re-evaluated prior to induction Oxygen Delivery Method: Circle System Utilized Preoxygenation: Pre-oxygenation with 100% oxygen Induction Type: IV induction Ventilation: Mask ventilation without difficulty and Oral airway inserted - appropriate to patient size Laryngoscope Size: Mac and 4 Grade View: Grade I Nasal Tubes: Left, Nasal Rae and Magill forceps- large, utilized Tube size: 7.5 mm Number of attempts: 1 Airway Equipment and Method: Oral airway Placement Confirmation: ETT inserted through vocal cords under direct vision,  positive ETCO2 and breath sounds checked- equal and bilateral Secured at: 28 cm Tube secured with: Tape Dental Injury: Teeth and Oropharynx as per pre-operative assessment

## 2018-04-06 ENCOUNTER — Other Ambulatory Visit (HOSPITAL_COMMUNITY): Payer: Managed Care, Other (non HMO)

## 2018-04-06 ENCOUNTER — Encounter (HOSPITAL_COMMUNITY): Payer: Self-pay | Admitting: Dentistry

## 2018-04-06 ENCOUNTER — Encounter (HOSPITAL_COMMUNITY): Payer: Managed Care, Other (non HMO)

## 2018-04-09 MED ORDER — DEXMEDETOMIDINE HCL IN NACL 400 MCG/100ML IV SOLN
0.1000 ug/kg/h | INTRAVENOUS | Status: DC
  Filled 2018-04-09 (×2): qty 100

## 2018-04-09 MED ORDER — SODIUM CHLORIDE 0.9 % IV SOLN
INTRAVENOUS | Status: DC
  Filled 2018-04-09: qty 30

## 2018-04-09 MED ORDER — MILRINONE LACTATE IN DEXTROSE 20-5 MG/100ML-% IV SOLN
0.3000 ug/kg/min | INTRAVENOUS | Status: AC
  Administered 2018-04-10: .3 ug/kg/min via INTRAVENOUS
  Filled 2018-04-09: qty 100

## 2018-04-09 MED ORDER — TRANEXAMIC ACID 1000 MG/10ML IV SOLN
1.5000 mg/kg/h | INTRAVENOUS | Status: DC
  Filled 2018-04-09: qty 25

## 2018-04-09 MED ORDER — KENNESTONE BLOOD CARDIOPLEGIA (KBC) MANNITOL SYRINGE (20%, 32ML)
32.0000 mL | INTRAVENOUS | Status: DC
  Filled 2018-04-09: qty 32

## 2018-04-09 MED ORDER — NITROGLYCERIN IN D5W 200-5 MCG/ML-% IV SOLN
2.0000 ug/min | INTRAVENOUS | Status: DC
  Filled 2018-04-09: qty 250

## 2018-04-09 MED ORDER — VANCOMYCIN HCL 10 G IV SOLR
1500.0000 mg | INTRAVENOUS | Status: AC
  Administered 2018-04-10: 1500 mg via INTRAVENOUS
  Filled 2018-04-09: qty 1500

## 2018-04-09 MED ORDER — PLASMA-LYTE 148 IV SOLN
INTRAVENOUS | Status: DC
  Filled 2018-04-09: qty 2.5

## 2018-04-09 MED ORDER — MAGNESIUM SULFATE 50 % IJ SOLN
40.0000 meq | INTRAMUSCULAR | Status: DC
  Filled 2018-04-09: qty 9.85

## 2018-04-09 MED ORDER — KENNESTONE BLOOD CARDIOPLEGIA VIAL
13.0000 mL | Status: DC
  Filled 2018-04-09: qty 13

## 2018-04-09 MED ORDER — PHENYLEPHRINE HCL-NACL 20-0.9 MG/250ML-% IV SOLN
30.0000 ug/min | INTRAVENOUS | Status: DC
  Filled 2018-04-09 (×2): qty 250

## 2018-04-09 MED ORDER — INSULIN REGULAR(HUMAN) IN NACL 100-0.9 UT/100ML-% IV SOLN
INTRAVENOUS | Status: DC
  Filled 2018-04-09: qty 100

## 2018-04-09 MED ORDER — TRANEXAMIC ACID (OHS) BOLUS VIA INFUSION
15.0000 mg/kg | INTRAVENOUS | Status: AC
  Administered 2018-04-10: 1531.5 mg via INTRAVENOUS
  Filled 2018-04-09: qty 1532

## 2018-04-09 MED ORDER — DOPAMINE-DEXTROSE 3.2-5 MG/ML-% IV SOLN
0.0000 ug/kg/min | INTRAVENOUS | Status: DC
  Filled 2018-04-09: qty 250

## 2018-04-09 MED ORDER — TRANEXAMIC ACID (OHS) PUMP PRIME SOLUTION
2.0000 mg/kg | INTRAVENOUS | Status: DC
  Filled 2018-04-09: qty 2.04

## 2018-04-09 MED ORDER — SODIUM CHLORIDE 0.9 % IV SOLN
1.5000 g | INTRAVENOUS | Status: AC
  Administered 2018-04-10: 1.5 g via INTRAVENOUS
  Filled 2018-04-09: qty 1.5

## 2018-04-09 MED ORDER — NOREPINEPHRINE 4 MG/250ML-% IV SOLN
0.0000 ug/min | INTRAVENOUS | Status: DC
  Filled 2018-04-09: qty 250

## 2018-04-09 MED ORDER — SODIUM CHLORIDE 0.9 % IV SOLN
750.0000 mg | INTRAVENOUS | Status: DC
  Filled 2018-04-09: qty 750

## 2018-04-09 MED ORDER — EPINEPHRINE PF 1 MG/ML IJ SOLN
0.0000 ug/min | INTRAVENOUS | Status: DC
  Filled 2018-04-09: qty 4

## 2018-04-09 MED ORDER — VANCOMYCIN HCL 1000 MG IV SOLR
INTRAVENOUS | Status: AC
  Administered 2018-04-10: 1000 mL
  Filled 2018-04-09: qty 1000

## 2018-04-09 MED ORDER — POTASSIUM CHLORIDE 2 MEQ/ML IV SOLN
80.0000 meq | INTRAVENOUS | Status: DC
  Filled 2018-04-09: qty 40

## 2018-04-09 NOTE — Anesthesia Preprocedure Evaluation (Addendum)
Anesthesia Evaluation  Patient identified by MRN, date of birth, ID band Patient awake    Reviewed: Allergy & Precautions, NPO status , Patient's Chart, lab work & pertinent test results, reviewed documented beta blocker date and time   History of Anesthesia Complications Negative for: history of anesthetic complications  Airway Mallampati: I  TM Distance: >3 FB Neck ROM: Full    Dental  (+) Poor Dentition, Missing, Dental Advisory Given   Pulmonary COPD, former smoker (recently quit),    breath sounds clear to auscultation       Cardiovascular hypertension, Pt. on medications and Pt. on home beta blockers (-) angina+ CAD (non-obstructive: 50% LCx, 50% RCA, 80% D1)  + dysrhythmias Supra Ventricular Tachycardia + Valvular Problems/Murmurs AS  Rhythm:Regular Rate:Normal + Systolic murmurs 10/02/4156 ECHO: EF 20-25%, diffuse hypokinesis, mild MR, bicuspid aortic valve with severe AS, mean grad 35 mmHg, AVA 0.82 cm2, mild dilation of aortic root   Neuro/Psych Anxiety negative neurological ROS     GI/Hepatic negative GI ROS, Neg liver ROS,   Endo/Other  negative endocrine ROS  Renal/GU negative Renal ROS     Musculoskeletal   Abdominal   Peds  Hematology negative hematology ROS (+)   Anesthesia Other Findings   Reproductive/Obstetrics                            Anesthesia Physical Anesthesia Plan  ASA: IV  Anesthesia Plan: General   Post-op Pain Management:    Induction: Intravenous  PONV Risk Score and Plan: 2 and Treatment may vary due to age or medical condition  Airway Management Planned: Oral ETT  Additional Equipment: Arterial line, PA Cath, 3D TEE and Ultrasound Guidance Line Placement  Intra-op Plan:   Post-operative Plan: Post-operative intubation/ventilation  Informed Consent: I have reviewed the patients History and Physical, chart, labs and discussed the procedure  including the risks, benefits and alternatives for the proposed anesthesia with the patient or authorized representative who has indicated his/her understanding and acceptance.     Dental advisory given  Plan Discussed with: CRNA and Surgeon  Anesthesia Plan Comments:        Anesthesia Quick Evaluation

## 2018-04-10 ENCOUNTER — Inpatient Hospital Stay (HOSPITAL_COMMUNITY): Payer: Managed Care, Other (non HMO) | Admitting: Certified Registered Nurse Anesthetist

## 2018-04-10 ENCOUNTER — Other Ambulatory Visit: Payer: Self-pay

## 2018-04-10 ENCOUNTER — Inpatient Hospital Stay (HOSPITAL_COMMUNITY): Payer: Managed Care, Other (non HMO)

## 2018-04-10 ENCOUNTER — Encounter (HOSPITAL_COMMUNITY)
Admission: RE | Disposition: A | Discharge status: Home / Self Care | Payer: Self-pay | Source: Home / Self Care | Attending: Thoracic Surgery (Cardiothoracic Vascular Surgery)

## 2018-04-10 ENCOUNTER — Inpatient Hospital Stay (HOSPITAL_COMMUNITY)
Admission: RE | Admit: 2018-04-10 | Discharge: 2018-04-16 | DRG: 219 | Disposition: A | Discharge status: Home / Self Care | Payer: Managed Care, Other (non HMO) | Attending: Thoracic Surgery (Cardiothoracic Vascular Surgery) | Admitting: Thoracic Surgery (Cardiothoracic Vascular Surgery)

## 2018-04-10 ENCOUNTER — Encounter (HOSPITAL_COMMUNITY): Payer: Self-pay | Admitting: Thoracic Surgery (Cardiothoracic Vascular Surgery)

## 2018-04-10 DIAGNOSIS — I700 Atherosclerosis of aorta: Secondary | ICD-10-CM | POA: Diagnosis present

## 2018-04-10 DIAGNOSIS — Z7982 Long term (current) use of aspirin: Secondary | ICD-10-CM

## 2018-04-10 DIAGNOSIS — Z7951 Long term (current) use of inhaled steroids: Secondary | ICD-10-CM | POA: Diagnosis not present

## 2018-04-10 DIAGNOSIS — I471 Supraventricular tachycardia: Secondary | ICD-10-CM | POA: Diagnosis not present

## 2018-04-10 DIAGNOSIS — Z8249 Family history of ischemic heart disease and other diseases of the circulatory system: Secondary | ICD-10-CM

## 2018-04-10 DIAGNOSIS — R112 Nausea with vomiting, unspecified: Secondary | ICD-10-CM | POA: Diagnosis not present

## 2018-04-10 DIAGNOSIS — I350 Nonrheumatic aortic (valve) stenosis: Secondary | ICD-10-CM | POA: Diagnosis present

## 2018-04-10 DIAGNOSIS — I493 Ventricular premature depolarization: Secondary | ICD-10-CM | POA: Diagnosis not present

## 2018-04-10 DIAGNOSIS — I712 Thoracic aortic aneurysm, without rupture: Secondary | ICD-10-CM | POA: Diagnosis present

## 2018-04-10 DIAGNOSIS — Q231 Congenital insufficiency of aortic valve: Secondary | ICD-10-CM | POA: Diagnosis not present

## 2018-04-10 DIAGNOSIS — K08409 Partial loss of teeth, unspecified cause, unspecified class: Secondary | ICD-10-CM | POA: Diagnosis present

## 2018-04-10 DIAGNOSIS — I719 Aortic aneurysm of unspecified site, without rupture: Secondary | ICD-10-CM | POA: Diagnosis present

## 2018-04-10 DIAGNOSIS — Z87891 Personal history of nicotine dependence: Secondary | ICD-10-CM

## 2018-04-10 DIAGNOSIS — I5042 Chronic combined systolic (congestive) and diastolic (congestive) heart failure: Secondary | ICD-10-CM | POA: Diagnosis present

## 2018-04-10 DIAGNOSIS — I420 Dilated cardiomyopathy: Secondary | ICD-10-CM | POA: Diagnosis present

## 2018-04-10 DIAGNOSIS — Z8679 Personal history of other diseases of the circulatory system: Secondary | ICD-10-CM

## 2018-04-10 DIAGNOSIS — Z79899 Other long term (current) drug therapy: Secondary | ICD-10-CM | POA: Diagnosis not present

## 2018-04-10 DIAGNOSIS — I110 Hypertensive heart disease with heart failure: Secondary | ICD-10-CM | POA: Diagnosis present

## 2018-04-10 DIAGNOSIS — I272 Pulmonary hypertension, unspecified: Secondary | ICD-10-CM | POA: Diagnosis present

## 2018-04-10 DIAGNOSIS — Z953 Presence of xenogenic heart valve: Secondary | ICD-10-CM | POA: Diagnosis not present

## 2018-04-10 DIAGNOSIS — E559 Vitamin D deficiency, unspecified: Secondary | ICD-10-CM | POA: Diagnosis present

## 2018-04-10 DIAGNOSIS — I2510 Atherosclerotic heart disease of native coronary artery without angina pectoris: Secondary | ICD-10-CM | POA: Diagnosis present

## 2018-04-10 DIAGNOSIS — I447 Left bundle-branch block, unspecified: Secondary | ICD-10-CM | POA: Diagnosis present

## 2018-04-10 DIAGNOSIS — Z9889 Other specified postprocedural states: Secondary | ICD-10-CM

## 2018-04-10 DIAGNOSIS — I491 Atrial premature depolarization: Secondary | ICD-10-CM | POA: Diagnosis not present

## 2018-04-10 DIAGNOSIS — I428 Other cardiomyopathies: Secondary | ICD-10-CM

## 2018-04-10 DIAGNOSIS — D6959 Other secondary thrombocytopenia: Secondary | ICD-10-CM | POA: Diagnosis not present

## 2018-04-10 DIAGNOSIS — D62 Acute posthemorrhagic anemia: Secondary | ICD-10-CM | POA: Diagnosis not present

## 2018-04-10 DIAGNOSIS — I7121 Aneurysm of the ascending aorta, without rupture: Secondary | ICD-10-CM

## 2018-04-10 DIAGNOSIS — I10 Essential (primary) hypertension: Secondary | ICD-10-CM | POA: Diagnosis present

## 2018-04-10 DIAGNOSIS — Q2381 Bicuspid aortic valve: Secondary | ICD-10-CM

## 2018-04-10 DIAGNOSIS — F419 Anxiety disorder, unspecified: Secondary | ICD-10-CM | POA: Diagnosis present

## 2018-04-10 DIAGNOSIS — I5043 Acute on chronic combined systolic (congestive) and diastolic (congestive) heart failure: Secondary | ICD-10-CM | POA: Diagnosis present

## 2018-04-10 DIAGNOSIS — Z9079 Acquired absence of other genital organ(s): Secondary | ICD-10-CM

## 2018-04-10 DIAGNOSIS — Q2543 Congenital aneurysm of aorta: Secondary | ICD-10-CM

## 2018-04-10 DIAGNOSIS — Z803 Family history of malignant neoplasm of breast: Secondary | ICD-10-CM

## 2018-04-10 DIAGNOSIS — J9811 Atelectasis: Secondary | ICD-10-CM

## 2018-04-10 DIAGNOSIS — Z808 Family history of malignant neoplasm of other organs or systems: Secondary | ICD-10-CM

## 2018-04-10 DIAGNOSIS — I77810 Thoracic aortic ectasia: Secondary | ICD-10-CM | POA: Diagnosis present

## 2018-04-10 HISTORY — DX: Congenital aneurysm of aorta: Q25.43

## 2018-04-10 HISTORY — DX: Presence of xenogenic heart valve: Z95.3

## 2018-04-10 HISTORY — DX: Aortic aneurysm of unspecified site, without rupture: I71.9

## 2018-04-10 HISTORY — PX: ASCENDING AORTIC ROOT REPLACEMENT: SHX5729

## 2018-04-10 HISTORY — DX: Aneurysm of the ascending aorta, without rupture: I71.21

## 2018-04-10 HISTORY — DX: Thoracic aortic aneurysm, without rupture: I71.2

## 2018-04-10 HISTORY — DX: Chronic combined systolic (congestive) and diastolic (congestive) heart failure: I50.42

## 2018-04-10 HISTORY — DX: Personal history of other diseases of the circulatory system: Z86.79

## 2018-04-10 HISTORY — DX: Other cardiomyopathies: I42.8

## 2018-04-10 HISTORY — DX: Other specified postprocedural states: Z98.890

## 2018-04-10 HISTORY — PX: AORTIC VALVE REPLACEMENT: SHX41

## 2018-04-10 HISTORY — PX: TEE WITHOUT CARDIOVERSION: SHX5443

## 2018-04-10 LAB — POCT I-STAT 4, (NA,K, GLUC, HGB,HCT)
Glucose, Bld: 126 mg/dL — ABNORMAL HIGH (ref 70–99)
Glucose, Bld: 125 mg/dL — ABNORMAL HIGH (ref 70–99)
Glucose, Bld: 151 mg/dL — ABNORMAL HIGH (ref 70–99)
Glucose, Bld: 155 mg/dL — ABNORMAL HIGH (ref 70–99)
Glucose, Bld: 180 mg/dL — ABNORMAL HIGH (ref 70–99)
Glucose, Bld: 109 mg/dL — ABNORMAL HIGH (ref 70–99)
HCT: 43 % (ref 39.0–52.0)
HCT: 43 % (ref 39.0–52.0)
HCT: 36 % — ABNORMAL LOW (ref 39.0–52.0)
HCT: 36 % — ABNORMAL LOW (ref 39.0–52.0)
HCT: 36 % — ABNORMAL LOW (ref 39.0–52.0)
HEMATOCRIT: 37 % — AB (ref 39.0–52.0)
HEMOGLOBIN: 12.6 g/dL — AB (ref 13.0–17.0)
Hemoglobin: 14.6 g/dL (ref 13.0–17.0)
Hemoglobin: 14.6 g/dL (ref 13.0–17.0)
Hemoglobin: 12.2 g/dL — ABNORMAL LOW (ref 13.0–17.0)
Hemoglobin: 12.2 g/dL — ABNORMAL LOW (ref 13.0–17.0)
Hemoglobin: 12.2 g/dL — ABNORMAL LOW (ref 13.0–17.0)
Potassium: 4.4 mmol/L (ref 3.5–5.1)
Potassium: 4.5 mmol/L (ref 3.5–5.1)
Potassium: 4.4 mmol/L (ref 3.5–5.1)
Potassium: 3.8 mmol/L (ref 3.5–5.1)
Potassium: 4.1 mmol/L (ref 3.5–5.1)
Potassium: 4.3 mmol/L (ref 3.5–5.1)
SODIUM: 137 mmol/L (ref 135–145)
Sodium: 138 mmol/L (ref 135–145)
Sodium: 136 mmol/L (ref 135–145)
Sodium: 139 mmol/L (ref 135–145)
Sodium: 140 mmol/L (ref 135–145)
Sodium: 141 mmol/L (ref 135–145)

## 2018-04-10 LAB — GLUCOSE, CAPILLARY
Glucose-Capillary: 98 mg/dL (ref 70–99)
Glucose-Capillary: 116 mg/dL — ABNORMAL HIGH (ref 70–99)
Glucose-Capillary: 114 mg/dL — ABNORMAL HIGH (ref 70–99)
Glucose-Capillary: 136 mg/dL — ABNORMAL HIGH (ref 70–99)
Glucose-Capillary: 142 mg/dL — ABNORMAL HIGH (ref 70–99)
Glucose-Capillary: 166 mg/dL — ABNORMAL HIGH (ref 70–99)
Glucose-Capillary: 161 mg/dL — ABNORMAL HIGH (ref 70–99)
Glucose-Capillary: 145 mg/dL — ABNORMAL HIGH (ref 70–99)

## 2018-04-10 LAB — POCT I-STAT 7, (LYTES, BLD GAS, ICA,H+H)
ACID-BASE DEFICIT: 3 mmol/L — AB (ref 0.0–2.0)
Acid-base deficit: 3 mmol/L — ABNORMAL HIGH (ref 0.0–2.0)
Acid-base deficit: 4 mmol/L — ABNORMAL HIGH (ref 0.0–2.0)
Acid-base deficit: 1 mmol/L (ref 0.0–2.0)
Acid-base deficit: 3 mmol/L — ABNORMAL HIGH (ref 0.0–2.0)
Acid-base deficit: 5 mmol/L — ABNORMAL HIGH (ref 0.0–2.0)
Bicarbonate: 23.1 mmol/L (ref 20.0–28.0)
Bicarbonate: 24.1 mmol/L (ref 20.0–28.0)
Bicarbonate: 24.2 mmol/L (ref 20.0–28.0)
Bicarbonate: 22.7 mmol/L (ref 20.0–28.0)
Bicarbonate: 23 mmol/L (ref 20.0–28.0)
Bicarbonate: 21 mmol/L (ref 20.0–28.0)
Calcium, Ion: 1.09 mmol/L — ABNORMAL LOW (ref 1.15–1.40)
Calcium, Ion: 1.27 mmol/L (ref 1.15–1.40)
Calcium, Ion: 1.21 mmol/L (ref 1.15–1.40)
Calcium, Ion: 1.22 mmol/L (ref 1.15–1.40)
Calcium, Ion: 1.22 mmol/L (ref 1.15–1.40)
Calcium, Ion: 1.19 mmol/L (ref 1.15–1.40)
HCT: 37 % — ABNORMAL LOW (ref 39.0–52.0)
HCT: 37 % — ABNORMAL LOW (ref 39.0–52.0)
HCT: 35 % — ABNORMAL LOW (ref 39.0–52.0)
HCT: 37 % — ABNORMAL LOW (ref 39.0–52.0)
HCT: 36 % — ABNORMAL LOW (ref 39.0–52.0)
HCT: 35 % — ABNORMAL LOW (ref 39.0–52.0)
Hemoglobin: 12.6 g/dL — ABNORMAL LOW (ref 13.0–17.0)
Hemoglobin: 12.6 g/dL — ABNORMAL LOW (ref 13.0–17.0)
Hemoglobin: 11.9 g/dL — ABNORMAL LOW (ref 13.0–17.0)
Hemoglobin: 12.6 g/dL — ABNORMAL LOW (ref 13.0–17.0)
Hemoglobin: 12.2 g/dL — ABNORMAL LOW (ref 13.0–17.0)
Hemoglobin: 11.9 g/dL — ABNORMAL LOW (ref 13.0–17.0)
O2 SAT: 97 %
O2 Saturation: 99 %
O2 Saturation: 99 %
O2 Saturation: 97 %
O2 Saturation: 98 %
O2 Saturation: 95 %
PH ART: 7.333 — AB (ref 7.350–7.450)
Patient temperature: 35.4
Patient temperature: 36.1
Patient temperature: 36.9
Patient temperature: 37
Patient temperature: 37
Potassium: 4.4 mmol/L (ref 3.5–5.1)
Potassium: 4.5 mmol/L (ref 3.5–5.1)
Potassium: 4.4 mmol/L (ref 3.5–5.1)
Potassium: 4.6 mmol/L (ref 3.5–5.1)
Potassium: 4.7 mmol/L (ref 3.5–5.1)
Potassium: 4.8 mmol/L (ref 3.5–5.1)
Sodium: 139 mmol/L (ref 135–145)
Sodium: 140 mmol/L (ref 135–145)
Sodium: 141 mmol/L (ref 135–145)
Sodium: 141 mmol/L (ref 135–145)
Sodium: 140 mmol/L (ref 135–145)
Sodium: 141 mmol/L (ref 135–145)
TCO2: 24 mmol/L (ref 22–32)
TCO2: 26 mmol/L (ref 22–32)
TCO2: 25 mmol/L (ref 22–32)
TCO2: 24 mmol/L (ref 22–32)
TCO2: 24 mmol/L (ref 22–32)
TCO2: 22 mmol/L (ref 22–32)
pCO2 arterial: 46.2 mmHg (ref 32.0–48.0)
pCO2 arterial: 50.3 mmHg — ABNORMAL HIGH (ref 32.0–48.0)
pCO2 arterial: 40.2 mmHg (ref 32.0–48.0)
pCO2 arterial: 42.7 mmHg (ref 32.0–48.0)
pCO2 arterial: 42.9 mmHg (ref 32.0–48.0)
pCO2 arterial: 40.6 mmHg (ref 32.0–48.0)
pH, Arterial: 7.307 — ABNORMAL LOW (ref 7.350–7.450)
pH, Arterial: 7.28 — ABNORMAL LOW (ref 7.350–7.450)
pH, Arterial: 7.384 (ref 7.350–7.450)
pH, Arterial: 7.336 — ABNORMAL LOW (ref 7.350–7.450)
pH, Arterial: 7.322 — ABNORMAL LOW (ref 7.350–7.450)
pO2, Arterial: 148 mmHg — ABNORMAL HIGH (ref 83.0–108.0)
pO2, Arterial: 99 mmHg (ref 83.0–108.0)
pO2, Arterial: 115 mmHg — ABNORMAL HIGH (ref 83.0–108.0)
pO2, Arterial: 102 mmHg (ref 83.0–108.0)
pO2, Arterial: 113 mmHg — ABNORMAL HIGH (ref 83.0–108.0)
pO2, Arterial: 79 mmHg — ABNORMAL LOW (ref 83.0–108.0)

## 2018-04-10 LAB — CBC
HCT: 39.6 % (ref 39.0–52.0)
HEMATOCRIT: 41.2 % (ref 39.0–52.0)
Hemoglobin: 13.8 g/dL (ref 13.0–17.0)
Hemoglobin: 13.2 g/dL (ref 13.0–17.0)
MCH: 29.4 pg (ref 26.0–34.0)
MCH: 29.1 pg (ref 26.0–34.0)
MCHC: 33.5 g/dL (ref 30.0–36.0)
MCHC: 33.3 g/dL (ref 30.0–36.0)
MCV: 87.8 fL (ref 80.0–100.0)
MCV: 87.2 fL (ref 80.0–100.0)
Platelets: 94 10*3/uL — ABNORMAL LOW (ref 150–400)
Platelets: 98 10*3/uL — ABNORMAL LOW (ref 150–400)
RBC: 4.69 MIL/uL (ref 4.22–5.81)
RBC: 4.54 MIL/uL (ref 4.22–5.81)
RDW: 13.2 % (ref 11.5–15.5)
RDW: 13.5 % (ref 11.5–15.5)
WBC: 16.7 10*3/uL — ABNORMAL HIGH (ref 4.0–10.5)
WBC: 18 10*3/uL — AB (ref 4.0–10.5)
nRBC: 0 % (ref 0.0–0.2)
nRBC: 0 % (ref 0.0–0.2)

## 2018-04-10 LAB — PLATELET COUNT: Platelets: 92 10*3/uL — ABNORMAL LOW (ref 150–400)

## 2018-04-10 LAB — CREATININE, SERUM
Creatinine, Ser: 0.93 mg/dL (ref 0.61–1.24)
GFR calc non Af Amer: >60 mL/min (ref >60)

## 2018-04-10 LAB — ECHO INTRAOPERATIVE TEE
Height: 75 in
WEIGHTICAEL: 3472 [oz_av]

## 2018-04-10 LAB — PROTIME-INR
INR: 1.4 — ABNORMAL HIGH (ref 0.8–1.2)
Prothrombin Time: 16.6 seconds — ABNORMAL HIGH (ref 11.4–15.2)

## 2018-04-10 LAB — APTT: aPTT: 36 seconds (ref 24–36)

## 2018-04-10 LAB — HEMOGLOBIN AND HEMATOCRIT, BLOOD
HEMATOCRIT: 40.2 % (ref 39.0–52.0)
Hemoglobin: 13.4 g/dL (ref 13.0–17.0)

## 2018-04-10 LAB — MAGNESIUM: MAGNESIUM: 3.3 mg/dL — AB (ref 1.7–2.4)

## 2018-04-10 SURGERY — ASCENDING AORTIC ROOT REPLACEMENT
Anesthesia: General | Site: Chest

## 2018-04-10 MED ORDER — SODIUM CHLORIDE 0.9 % IV SOLN
INTRAVENOUS | Status: DC
  Administered 2018-04-10: 13:00:00 via INTRAVENOUS

## 2018-04-10 MED ORDER — ALBUMIN HUMAN 5 % IV SOLN
250.0000 mL | INTRAVENOUS | Status: AC | PRN
  Administered 2018-04-10 (×3): 12.5 g via INTRAVENOUS
  Filled 2018-04-10 (×2): qty 250

## 2018-04-10 MED ORDER — PROTAMINE SULFATE 10 MG/ML IV SOLN
INTRAVENOUS | Status: AC
  Filled 2018-04-10: qty 5

## 2018-04-10 MED ORDER — MAGNESIUM SULFATE 4 GM/100ML IV SOLN
4.0000 g | Freq: Once | INTRAVENOUS | Status: AC
  Filled 2018-04-10: qty 100

## 2018-04-10 MED ORDER — INSULIN REGULAR(HUMAN) IN NACL 100-0.9 UT/100ML-% IV SOLN: INTRAVENOUS | Status: DC

## 2018-04-10 MED ORDER — MORPHINE SULFATE (PF) 2 MG/ML IV SOLN
1.0000 mg | INTRAVENOUS | Status: DC | PRN
  Administered 2018-04-10 – 2018-04-12 (×12): 2 mg via INTRAVENOUS
  Filled 2018-04-10 (×13): qty 1

## 2018-04-10 MED ORDER — MIDAZOLAM HCL 2 MG/2ML IJ SOLN: 2.0000 mg | INTRAMUSCULAR | Status: DC | PRN

## 2018-04-10 MED ORDER — CHLORHEXIDINE GLUCONATE 0.12 % MT SOLN
OROMUCOSAL | Status: AC
  Administered 2018-04-10: 15 mL via OROMUCOSAL
  Filled 2018-04-10: qty 15

## 2018-04-10 MED ORDER — ARTIFICIAL TEARS OPHTHALMIC OINT
TOPICAL_OINTMENT | OPHTHALMIC | Status: DC | PRN
  Administered 2018-04-10: 1 via OPHTHALMIC

## 2018-04-10 MED ORDER — DOCUSATE SODIUM 100 MG PO CAPS
200.0000 mg | ORAL_CAPSULE | Freq: Every day | ORAL | Status: DC
  Administered 2018-04-11 – 2018-04-13 (×3): 200 mg via ORAL
  Filled 2018-04-10 (×5): qty 2

## 2018-04-10 MED ORDER — MIDAZOLAM HCL 5 MG/5ML IJ SOLN
INTRAMUSCULAR | Status: DC | PRN
  Administered 2018-04-10: 4 mg via INTRAVENOUS
  Administered 2018-04-10: 2 mg via INTRAVENOUS
  Administered 2018-04-10: 4 mg via INTRAVENOUS

## 2018-04-10 MED ORDER — ROCURONIUM BROMIDE 50 MG/5ML IV SOSY
PREFILLED_SYRINGE | INTRAVENOUS | Status: AC
  Filled 2018-04-10: qty 40

## 2018-04-10 MED ORDER — VANCOMYCIN HCL IN DEXTROSE 1-5 GM/200ML-% IV SOLN
1000.0000 mg | Freq: Once | INTRAVENOUS | Status: AC
  Administered 2018-04-10: 1000 mg via INTRAVENOUS
  Filled 2018-04-10: qty 200

## 2018-04-10 MED ORDER — CHLORHEXIDINE GLUCONATE 0.12 % MT SOLN
15.0000 mL | Freq: Once | OROMUCOSAL | Status: AC
  Administered 2018-04-10: 15 mL via OROMUCOSAL

## 2018-04-10 MED ORDER — ROCURONIUM BROMIDE 10 MG/ML (PF) SYRINGE
PREFILLED_SYRINGE | INTRAVENOUS | Status: DC | PRN
  Administered 2018-04-10 (×4): 50 mg via INTRAVENOUS

## 2018-04-10 MED ORDER — ONDANSETRON HCL 4 MG/2ML IJ SOLN
4.0000 mg | Freq: Four times a day (QID) | INTRAMUSCULAR | Status: DC | PRN
  Administered 2018-04-12: 4 mg via INTRAVENOUS
  Filled 2018-04-10: qty 2

## 2018-04-10 MED ORDER — INSULIN REGULAR BOLUS VIA INFUSION
0.0000 [IU] | Freq: Three times a day (TID) | INTRAVENOUS | Status: DC
  Filled 2018-04-10: qty 10

## 2018-04-10 MED ORDER — SODIUM CHLORIDE (PF) 0.9 % IJ SOLN
INTRAMUSCULAR | Status: AC
  Filled 2018-04-10: qty 10

## 2018-04-10 MED ORDER — HEPARIN SODIUM (PORCINE) 1000 UNIT/ML IJ SOLN
INTRAMUSCULAR | Status: DC | PRN
  Administered 2018-04-10: 33000 [IU] via INTRAVENOUS

## 2018-04-10 MED ORDER — SODIUM CHLORIDE (PF) 0.9 % IJ SOLN
OROMUCOSAL | Status: DC | PRN
  Administered 2018-04-10 (×5): 4 mL via TOPICAL

## 2018-04-10 MED ORDER — PROTAMINE SULFATE 10 MG/ML IV SOLN
INTRAVENOUS | Status: AC
  Filled 2018-04-10: qty 25

## 2018-04-10 MED ORDER — ALBUMIN HUMAN 5 % IV SOLN
INTRAVENOUS | Status: DC | PRN
  Administered 2018-04-10 (×3): via INTRAVENOUS

## 2018-04-10 MED ORDER — LACTATED RINGERS IV SOLN: INTRAVENOUS | Status: DC

## 2018-04-10 MED ORDER — TRANEXAMIC ACID 1000 MG/10ML IV SOLN
INTRAVENOUS | Status: DC | PRN
  Administered 2018-04-10: 1.5 mg/kg/h via INTRAVENOUS

## 2018-04-10 MED ORDER — BISACODYL 5 MG PO TBEC
10.0000 mg | DELAYED_RELEASE_TABLET | Freq: Every day | ORAL | Status: DC
  Administered 2018-04-11 – 2018-04-13 (×3): 10 mg via ORAL
  Filled 2018-04-10 (×5): qty 2

## 2018-04-10 MED ORDER — FAMOTIDINE IN NACL 20-0.9 MG/50ML-% IV SOLN: 20.0000 mg | Freq: Two times a day (BID) | INTRAVENOUS | Status: DC

## 2018-04-10 MED ORDER — MILRINONE LACTATE IN DEXTROSE 20-5 MG/100ML-% IV SOLN
0.2500 ug/kg/min | INTRAVENOUS | Status: DC
  Administered 2018-04-11: 0.25 ug/kg/min via INTRAVENOUS
  Filled 2018-04-10 (×3): qty 100

## 2018-04-10 MED ORDER — ACETAMINOPHEN 160 MG/5ML PO SOLN: 1000.0000 mg | Freq: Four times a day (QID) | ORAL | Status: DC

## 2018-04-10 MED ORDER — ORAL CARE MOUTH RINSE
15.0000 mL | Freq: Two times a day (BID) | OROMUCOSAL | Status: DC
  Administered 2018-04-11 – 2018-04-14 (×3): 15 mL via OROMUCOSAL

## 2018-04-10 MED ORDER — SODIUM CHLORIDE 0.9 % IV SOLN
INTRAVENOUS | Status: DC | PRN
  Administered 2018-04-10: 1.3 [IU]/h via INTRAVENOUS

## 2018-04-10 MED ORDER — SODIUM CHLORIDE 0.9 % IV SOLN: 250.0000 mL | INTRAVENOUS | Status: DC

## 2018-04-10 MED ORDER — ACETAMINOPHEN 650 MG RE SUPP
650.0000 mg | Freq: Once | RECTAL | Status: AC
  Administered 2018-04-10: 650 mg via RECTAL

## 2018-04-10 MED ORDER — TRANEXAMIC ACID 1000 MG/10ML IV SOLN
1.5000 mg/kg/h | INTRAVENOUS | Status: DC
  Filled 2018-04-10: qty 25

## 2018-04-10 MED ORDER — ETOMIDATE 2 MG/ML IV SOLN
INTRAVENOUS | Status: AC
  Filled 2018-04-10: qty 10

## 2018-04-10 MED ORDER — LACTATED RINGERS IV SOLN
INTRAVENOUS | Status: DC | PRN
  Administered 2018-04-10: 07:00:00 via INTRAVENOUS

## 2018-04-10 MED ORDER — CHLORHEXIDINE GLUCONATE CLOTH 2 % EX PADS
6.0000 | MEDICATED_PAD | Freq: Every day | CUTANEOUS | Status: DC
  Administered 2018-04-10 – 2018-04-12 (×3): 6 via TOPICAL

## 2018-04-10 MED ORDER — METOPROLOL TARTRATE 12.5 MG HALF TABLET
ORAL_TABLET | ORAL | Status: AC
  Filled 2018-04-10: qty 1

## 2018-04-10 MED ORDER — ACETAMINOPHEN 500 MG PO TABS
1000.0000 mg | ORAL_TABLET | Freq: Four times a day (QID) | ORAL | Status: AC
  Administered 2018-04-10 – 2018-04-15 (×20): 1000 mg via ORAL
  Filled 2018-04-10 (×20): qty 2

## 2018-04-10 MED ORDER — PHENYLEPHRINE 40 MCG/ML (10ML) SYRINGE FOR IV PUSH (FOR BLOOD PRESSURE SUPPORT)
PREFILLED_SYRINGE | INTRAVENOUS | Status: DC | PRN
  Administered 2018-04-10: 40 ug via INTRAVENOUS

## 2018-04-10 MED ORDER — DEXMEDETOMIDINE HCL IN NACL 200 MCG/50ML IV SOLN
INTRAVENOUS | Status: DC | PRN
  Administered 2018-04-10: 0.4 ug/kg/h via INTRAVENOUS

## 2018-04-10 MED ORDER — CHLORHEXIDINE GLUCONATE 4 % EX LIQD: 30.0000 mL | CUTANEOUS | Status: DC

## 2018-04-10 MED ORDER — ACETAMINOPHEN 160 MG/5ML PO SOLN: 650.0000 mg | Freq: Once | ORAL | Status: AC

## 2018-04-10 MED ORDER — PROTAMINE SULFATE 10 MG/ML IV SOLN
INTRAVENOUS | Status: DC | PRN
  Administered 2018-04-10 (×5): 50 mg via INTRAVENOUS
  Administered 2018-04-10: 10 mg via INTRAVENOUS
  Administered 2018-04-10: 40 mg via INTRAVENOUS

## 2018-04-10 MED ORDER — SODIUM CHLORIDE 0.9 % IV SOLN
INTRAVENOUS | Status: AC
  Administered 2018-04-10: 13:00:00 via INTRAVENOUS

## 2018-04-10 MED ORDER — METOPROLOL TARTRATE 12.5 MG HALF TABLET: 12.5000 mg | ORAL_TABLET | Freq: Once | ORAL | Status: DC

## 2018-04-10 MED ORDER — MIDAZOLAM HCL (PF) 10 MG/2ML IJ SOLN
INTRAMUSCULAR | Status: AC
  Filled 2018-04-10: qty 2

## 2018-04-10 MED ORDER — FENTANYL CITRATE (PF) 250 MCG/5ML IJ SOLN
INTRAMUSCULAR | Status: AC
  Filled 2018-04-10: qty 25

## 2018-04-10 MED ORDER — TRAMADOL HCL 50 MG PO TABS
50.0000 mg | ORAL_TABLET | ORAL | Status: DC | PRN
  Administered 2018-04-12: 100 mg via ORAL
  Filled 2018-04-10: qty 2

## 2018-04-10 MED ORDER — PANTOPRAZOLE SODIUM 40 MG PO TBEC
40.0000 mg | DELAYED_RELEASE_TABLET | Freq: Every day | ORAL | Status: DC
  Administered 2018-04-12 – 2018-04-16 (×5): 40 mg via ORAL
  Filled 2018-04-10 (×5): qty 1

## 2018-04-10 MED ORDER — CHLORHEXIDINE GLUCONATE 0.12 % MT SOLN
15.0000 mL | OROMUCOSAL | Status: AC
  Administered 2018-04-10: 15 mL via OROMUCOSAL

## 2018-04-10 MED ORDER — SODIUM CHLORIDE 0.9 % IV SOLN
INTRAVENOUS | Status: DC | PRN
  Administered 2018-04-10: 750 mg via INTRAVENOUS

## 2018-04-10 MED ORDER — OXYCODONE HCL 5 MG PO TABS
5.0000 mg | ORAL_TABLET | ORAL | Status: DC | PRN
  Administered 2018-04-11 – 2018-04-12 (×6): 10 mg via ORAL
  Filled 2018-04-10 (×8): qty 2

## 2018-04-10 MED ORDER — PROPOFOL 10 MG/ML IV BOLUS
INTRAVENOUS | Status: AC
  Filled 2018-04-10: qty 20

## 2018-04-10 MED ORDER — ASPIRIN 81 MG PO CHEW: 324.0000 mg | CHEWABLE_TABLET | Freq: Every day | ORAL | Status: DC

## 2018-04-10 MED ORDER — NITROGLYCERIN IN D5W 200-5 MCG/ML-% IV SOLN: 0.0000 ug/min | INTRAVENOUS | Status: DC

## 2018-04-10 MED ORDER — EPHEDRINE SULFATE 50 MG/ML IJ SOLN
INTRAMUSCULAR | Status: DC | PRN
  Administered 2018-04-10 (×2): 5 mg via INTRAVENOUS

## 2018-04-10 MED ORDER — SODIUM CHLORIDE 0.9 % IR SOLN
Status: DC | PRN
  Administered 2018-04-10: 5000 mL

## 2018-04-10 MED ORDER — POTASSIUM CHLORIDE 10 MEQ/50ML IV SOLN: 10.0000 meq | INTRAVENOUS | Status: AC

## 2018-04-10 MED ORDER — SODIUM CHLORIDE 0.9 % IV SOLN
1.5000 g | Freq: Two times a day (BID) | INTRAVENOUS | Status: AC
  Administered 2018-04-10 – 2018-04-12 (×4): 1.5 g via INTRAVENOUS
  Filled 2018-04-10 (×5): qty 1.5

## 2018-04-10 MED ORDER — SODIUM CHLORIDE 0.9 % IV SOLN
INTRAVENOUS | Status: DC | PRN
  Administered 2018-04-10: 12:00:00 via INTRAVENOUS

## 2018-04-10 MED ORDER — SODIUM CHLORIDE 0.9% FLUSH
3.0000 mL | Freq: Two times a day (BID) | INTRAVENOUS | Status: DC
  Administered 2018-04-11 – 2018-04-14 (×8): 3 mL via INTRAVENOUS

## 2018-04-10 MED ORDER — SODIUM CHLORIDE 0.9% FLUSH: 3.0000 mL | INTRAVENOUS | Status: DC | PRN

## 2018-04-10 MED ORDER — SODIUM CHLORIDE 0.9 % IV SOLN
INTRAVENOUS | Status: DC | PRN
  Administered 2018-04-10: 25 ug/min via INTRAVENOUS

## 2018-04-10 MED ORDER — ASPIRIN EC 325 MG PO TBEC
325.0000 mg | DELAYED_RELEASE_TABLET | Freq: Every day | ORAL | Status: DC
  Administered 2018-04-11 – 2018-04-16 (×6): 325 mg via ORAL
  Filled 2018-04-10 (×6): qty 1

## 2018-04-10 MED ORDER — LACTATED RINGERS IV SOLN
INTRAVENOUS | Status: DC
  Administered 2018-04-11: 08:00:00 via INTRAVENOUS

## 2018-04-10 MED ORDER — ETOMIDATE 2 MG/ML IV SOLN
INTRAVENOUS | Status: DC | PRN
  Administered 2018-04-10: 6 mg via INTRAVENOUS

## 2018-04-10 MED ORDER — FENTANYL CITRATE (PF) 100 MCG/2ML IJ SOLN
INTRAMUSCULAR | Status: DC | PRN
  Administered 2018-04-10: 100 ug via INTRAVENOUS
  Administered 2018-04-10 (×3): 50 ug via INTRAVENOUS
  Administered 2018-04-10 (×4): 250 ug via INTRAVENOUS

## 2018-04-10 MED ORDER — DEXMEDETOMIDINE HCL IN NACL 200 MCG/50ML IV SOLN
0.0000 ug/kg/h | INTRAVENOUS | Status: DC
  Administered 2018-04-10: 0.7 ug/kg/h via INTRAVENOUS

## 2018-04-10 MED ORDER — LACTATED RINGERS IV SOLN: 500.0000 mL | Freq: Once | INTRAVENOUS | Status: DC | PRN

## 2018-04-10 MED ORDER — BISACODYL 10 MG RE SUPP: 10.0000 mg | Freq: Every day | RECTAL | Status: DC

## 2018-04-10 MED ORDER — SODIUM BICARBONATE 8.4 % IV SOLN
50.0000 meq | Freq: Once | INTRAVENOUS | Status: AC
  Administered 2018-04-10: 50 meq via INTRAVENOUS

## 2018-04-10 MED ORDER — METOPROLOL TARTRATE 5 MG/5ML IV SOLN: 2.5000 mg | INTRAVENOUS | Status: DC | PRN

## 2018-04-10 MED ORDER — SODIUM CHLORIDE 0.45 % IV SOLN: INTRAVENOUS | Status: DC | PRN

## 2018-04-10 MED ORDER — PHENYLEPHRINE HCL-NACL 20-0.9 MG/250ML-% IV SOLN
0.0000 ug/min | INTRAVENOUS | Status: DC
  Administered 2018-04-10: 80 ug/min via INTRAVENOUS
  Filled 2018-04-10 (×4): qty 250

## 2018-04-10 MED ORDER — MUPIROCIN 2 % EX OINT
1.0000 "application " | TOPICAL_OINTMENT | Freq: Two times a day (BID) | CUTANEOUS | Status: AC
  Administered 2018-04-10 – 2018-04-14 (×10): 1 via NASAL
  Filled 2018-04-10 (×3): qty 22

## 2018-04-10 SURGICAL SUPPLY — 92 items
ADAPTER CARDIO PERF ANTE/RETRO (ADAPTER) ×3 IMPLANT
ADPR PRFSN 84XANTGRD RTRGD (ADAPTER) ×2
APL SRG 7X2 LUM MLBL SLNT (VASCULAR PRODUCTS) ×2
APPLICATOR TIP COSEAL (VASCULAR PRODUCTS) ×1 IMPLANT
ATTRACTOMAT 16X20 MAGNETIC DRP (DRAPES) ×3 IMPLANT
BAG DECANTER FOR FLEXI CONT (MISCELLANEOUS) ×3 IMPLANT
BLADE STERNUM SYSTEM 6 (BLADE) ×3 IMPLANT
BLADE SURG 11 STRL SS (BLADE) ×3 IMPLANT
CANISTER SUCT 3000ML PPV (MISCELLANEOUS) ×3 IMPLANT
CANNULA EZ GLIDE AORTIC 21FR (CANNULA) ×1 IMPLANT
CANNULA GUNDRY RCSP 15FR (MISCELLANEOUS) ×3 IMPLANT
CANNULA MC2 2 STG 36/46 NON-V (CANNULA) IMPLANT
CANNULA OPTISITE PERFUSION 20F (CANNULA) ×1 IMPLANT
CANNULA VENOUS 2 STG 34/46 (CANNULA) ×1
CATH COUDE FOLEY 5CC 14FR (CATHETERS) ×1 IMPLANT
CATH CPB KIT OWEN (MISCELLANEOUS) ×3 IMPLANT
CATH HEART VENT LEFT (CATHETERS) ×2 IMPLANT
CATH THORACIC 36FR (CATHETERS) IMPLANT
CATH THORACIC 36FR RT ANG (CATHETERS) ×3 IMPLANT
CAUTERY SURG HI TEMP FINE TIP (MISCELLANEOUS) ×1 IMPLANT
CONN ST 1/4X3/8  BEN (MISCELLANEOUS) ×2
CONN ST 1/4X3/8 BEN (MISCELLANEOUS) IMPLANT
COVER SURGICAL LIGHT HANDLE (MISCELLANEOUS) ×6 IMPLANT
COVER WAND RF STERILE (DRAPES) ×3 IMPLANT
CRADLE DONUT ADULT HEAD (MISCELLANEOUS) ×3 IMPLANT
DEVICE SUT CK QUICK LOAD INDV (Prosthesis & Implant Heart) ×3 IMPLANT
DEVICE SUT CK QUICK LOAD MINI (Prosthesis & Implant Heart) ×1 IMPLANT
DRAIN CHANNEL 32F RND 10.7 FF (WOUND CARE) IMPLANT
DRAPE INCISE IOBAN 66X45 STRL (DRAPES) ×3 IMPLANT
DRAPE SLUSH/WARMER DISC (DRAPES) ×3 IMPLANT
DRSG AQUACEL AG ADV 3.5X14 (GAUZE/BANDAGES/DRESSINGS) ×1 IMPLANT
DRSG COVADERM 4X14 (GAUZE/BANDAGES/DRESSINGS) ×3 IMPLANT
ELECT REM PT RETURN 9FT ADLT (ELECTROSURGICAL) ×6
ELECTRODE REM PT RTRN 9FT ADLT (ELECTROSURGICAL) ×4 IMPLANT
GAUZE SPONGE 4X4 12PLY STRL (GAUZE/BANDAGES/DRESSINGS) ×5 IMPLANT
GLOVE BIO SURGEON STRL SZ 6 (GLOVE) ×5 IMPLANT
GLOVE BIO SURGEON STRL SZ 6.5 (GLOVE) ×4 IMPLANT
GLOVE BIOGEL PI IND STRL 6 (GLOVE) IMPLANT
GLOVE BIOGEL PI INDICATOR 6 (GLOVE) ×2
GLOVE ORTHO TXT STRL SZ7.5 (GLOVE) ×9 IMPLANT
GOWN STRL REUS W/ TWL LRG LVL3 (GOWN DISPOSABLE) ×8 IMPLANT
GOWN STRL REUS W/TWL LRG LVL3 (GOWN DISPOSABLE) ×24
GRAFT WOVEN D/V 26DX30L (Vascular Products) ×1 IMPLANT
HEMOSTAT POWDER SURGIFOAM 1G (HEMOSTASIS) ×9 IMPLANT
INSERT FOGARTY XLG (MISCELLANEOUS) ×3 IMPLANT
KIT BASIN OR (CUSTOM PROCEDURE TRAY) ×3 IMPLANT
KIT DILATOR VASC 18G NDL (KITS) ×1 IMPLANT
KIT DRAINAGE VACCUM ASSIST (KITS) ×1 IMPLANT
KIT SUCTION CATH 14FR (SUCTIONS) ×6 IMPLANT
KIT SUT CK MINI COMBO 4X17 (Prosthesis & Implant Heart) ×1 IMPLANT
KIT TURNOVER KIT B (KITS) ×3 IMPLANT
LEAD PACING MYOCARDI (MISCELLANEOUS) ×3 IMPLANT
LINE VENT (MISCELLANEOUS) ×1 IMPLANT
NS IRRIG 1000ML POUR BTL (IV SOLUTION) ×15 IMPLANT
PACK E OPEN HEART (SUTURE) ×3 IMPLANT
PACK OPEN HEART (CUSTOM PROCEDURE TRAY) ×3 IMPLANT
PAD ARMBOARD 7.5X6 YLW CONV (MISCELLANEOUS) ×6 IMPLANT
SEALANT SURG COSEAL 8ML (VASCULAR PRODUCTS) ×1 IMPLANT
SET CARDIOPLEGIA MPS 5001102 (MISCELLANEOUS) ×1 IMPLANT
SET IRRIG TUBING LAPAROSCOPIC (IRRIGATION / IRRIGATOR) ×4 IMPLANT
SUT BONE WAX W31G (SUTURE) ×3 IMPLANT
SUT ETHIBON 2 0 V 52N 30 (SUTURE) ×4 IMPLANT
SUT ETHIBOND 2 0 V4 (SUTURE) IMPLANT
SUT ETHIBOND 2 0V4 GREEN (SUTURE) IMPLANT
SUT ETHIBOND 4 0 RB 1 (SUTURE) IMPLANT
SUT ETHIBOND X763 2 0 SH 1 (SUTURE) ×9 IMPLANT
SUT MNCRL AB 3-0 PS2 18 (SUTURE) ×6 IMPLANT
SUT PDS AB 1 CTX 36 (SUTURE) ×6 IMPLANT
SUT PROLENE 3 0 SH DA (SUTURE) ×2 IMPLANT
SUT PROLENE 3 0 SH1 36 (SUTURE) ×5 IMPLANT
SUT PROLENE 4 0 RB 1 (SUTURE) ×12
SUT PROLENE 4 0 SH DA (SUTURE) ×1 IMPLANT
SUT PROLENE 4-0 RB1 .5 CRCL 36 (SUTURE) IMPLANT
SUT PROLENE 6 0 C 1 30 (SUTURE) IMPLANT
SUT SILK  1 MH (SUTURE) ×1
SUT SILK 1 MH (SUTURE) ×2 IMPLANT
SUT SILK 2 0 SH CR/8 (SUTURE) IMPLANT
SUT SILK 3 0 SH CR/8 (SUTURE) IMPLANT
SUT STEEL 6MS V (SUTURE) IMPLANT
SUT STEEL STERNAL CCS#1 18IN (SUTURE) ×1 IMPLANT
SUT VIC AB 2-0 CTX 27 (SUTURE) ×3 IMPLANT
SYSTEM SAHARA CHEST DRAIN ATS (WOUND CARE) ×3 IMPLANT
TAPE CLOTH SOFT 2X10 (GAUZE/BANDAGES/DRESSINGS) ×1 IMPLANT
TAPE CLOTH SURG 4X10 WHT LF (GAUZE/BANDAGES/DRESSINGS) ×1 IMPLANT
TOWEL GREEN STERILE (TOWEL DISPOSABLE) ×3 IMPLANT
TOWEL GREEN STERILE FF (TOWEL DISPOSABLE) ×3 IMPLANT
TUBE CONNECTING 20X1/4 (TUBING) ×1 IMPLANT
UNDERPAD 30X30 (UNDERPADS AND DIAPERS) ×3 IMPLANT
VALVE AORTIC SZ29 INSP/RESIL (Valve) ×1 IMPLANT
VENT LEFT HEART 12002 (CATHETERS) ×3
WATER STERILE IRR 1000ML POUR (IV SOLUTION) ×6 IMPLANT
YANKAUER SUCT BULB TIP NO VENT (SUCTIONS) ×1 IMPLANT

## 2018-04-10 NOTE — Anesthesia Procedure Notes (Signed)
Arterial Line Insertion Start/End3/04/2018 7:00 AM Performed by: Kathryne Hitch, CRNA, CRNA  Patient location: Pre-op. Preanesthetic checklist: patient identified, IV checked, site marked, risks and benefits discussed and pre-op evaluation Patient sedated Left, radial was placed Catheter size: 20 G Hand hygiene performed  and maximum sterile barriers used   Attempts: 1 Procedure performed without using ultrasound guided technique. Following insertion, dressing applied and Biopatch. Post procedure assessment: normal  Patient tolerated the procedure well with no immediate complications.

## 2018-04-10 NOTE — Interval H&P Note (Signed)
History and Physical Interval Note:  04/10/2018 6:10 AM  Tyler Berger  has presented today for surgery, with the diagnosis of AS AORTIC ROOT ANEURYSM  The various methods of treatment have been discussed with the patient and family. After consideration of risks, benefits and other options for treatment, the patient has consented to  Procedure(s): ASCENDING AORTIC ROOT REPLACEMENT (N/A) TRANSESOPHAGEAL ECHOCARDIOGRAM (TEE) (N/A) as a surgical intervention .  The patient's history has been reviewed, patient examined, no change in status, stable for surgery.  I have reviewed the patient's chart and labs.  Questions were answered to the patient's satisfaction.     Rexene Alberts

## 2018-04-10 NOTE — Anesthesia Procedure Notes (Signed)
Procedure Name: Intubation Date/Time: 04/10/2018 7:53 AM Performed by: Kathryne Hitch, CRNA Pre-anesthesia Checklist: Patient identified, Emergency Drugs available, Suction available, Patient being monitored and Timeout performed Oxygen Delivery Method: Circle system utilized Preoxygenation: Pre-oxygenation with 100% oxygen Induction Type: IV induction Ventilation: Mask ventilation without difficulty Laryngoscope Size: Miller and 2 Grade View: Grade I Tube type: Oral Tube size: 8.0 mm Number of attempts: 1 Airway Equipment and Method: Stylet and Oral airway Placement Confirmation: ETT inserted through vocal cords under direct vision,  positive ETCO2 and breath sounds checked- equal and bilateral Secured at: 25 cm Tube secured with: Tape Dental Injury: Teeth and Oropharynx as per pre-operative assessment

## 2018-04-10 NOTE — Plan of Care (Signed)
  Problem: Education: Goal: Knowledge of disease or condition will improve Outcome: Progressing   Problem: Activity: Goal: Risk for activity intolerance will decrease Outcome: Progressing   Problem: Cardiac: Goal: Will achieve and/or maintain hemodynamic stability Outcome: Progressing   Problem: Clinical Measurements: Goal: Postoperative complications will be avoided or minimized Outcome: Progressing   Problem: Respiratory: Goal: Respiratory status will improve Outcome: Progressing

## 2018-04-10 NOTE — Anesthesia Postprocedure Evaluation (Signed)
Anesthesia Post Note  Patient: JONATHON TAN  Procedure(s) Performed: RESECTION AND GRAFTING OF ASCENDING AORTIC ROOT ANUERYSM (SUPRACORONARY) (N/A Chest) TRANSESOPHAGEAL ECHOCARDIOGRAM (TEE) (N/A ) Aortic Valve Replacement (Avr), USING INSPIRIS 29MM (N/A Chest)     Patient location during evaluation: SICU Anesthesia Type: General Level of consciousness: awake and alert, oriented and patient cooperative Pain management: pain level controlled Vital Signs Assessment: post-procedure vital signs reviewed and stable Respiratory status: spontaneous breathing, nonlabored ventilation, respiratory function stable and patient connected to nasal cannula oxygen (recently extubated) Cardiovascular status: stable (remains on milrinone support) Postop Assessment: no apparent nausea or vomiting Anesthetic complications: no    Last Vitals:  Vitals:   04/10/18 1915 04/10/18 2000  BP:    Pulse: (!) 59 86  Resp: 20 (!) 24  Temp: 37.1 C 37.1 C  SpO2: 99% 96%    Last Pain:  Vitals:   04/10/18 1900  TempSrc: Core (Comment)  PainSc:                  Jaystin Mcgarvey,E. Yeiren [place]

## 2018-04-10 NOTE — Progress Notes (Signed)
  Echocardiogram Echocardiogram Transesophageal has been performed.  Tyler Berger 04/10/2018, 8:58 AM

## 2018-04-10 NOTE — Progress Notes (Signed)
PT failed mechanics x 3   nif -10 vc 4l

## 2018-04-10 NOTE — Anesthesia Procedure Notes (Signed)
Central Venous Catheter Insertion Performed by: Effie Berkshire, MD, anesthesiologist Start/End3/04/2018 7:05 AM, 04/10/2018 7:08 AM Patient location: Pre-op. Preanesthetic checklist: patient identified, IV checked, site marked, risks and benefits discussed, surgical consent, monitors and equipment checked, pre-op evaluation, timeout performed and anesthesia consent Hand hygiene performed  and maximum sterile barriers used  PA cath was placed.Swan type:thermodilution Procedure performed without using ultrasound guided technique. Attempts: 1 Patient tolerated the procedure well with no immediate complications.

## 2018-04-10 NOTE — Transfer of Care (Signed)
Immediate Anesthesia Transfer of Care Note  Patient: Tyler Berger  Procedure(s) Performed: RESECTION AND GRAFTING OF ASCENDING AORTIC ROOT ANUERYSM (SUPRACORONARY) (N/A Chest) TRANSESOPHAGEAL ECHOCARDIOGRAM (TEE) (N/A ) Aortic Valve Replacement (Avr), USING INSPIRIS 29MM (N/A Chest)  Patient Location: SICU  Anesthesia Type:General  Level of Consciousness: Patient remains intubated per anesthesia plan  Airway & Oxygen Therapy: Patient remains intubated per anesthesia plan  Post-op Assessment: Report given to RN and Post -op Vital signs reviewed and stable  Post vital signs: Reviewed and stable  Last Vitals:  Vitals Value Taken Time  BP    Temp    Pulse    Resp    SpO2      Last Pain:  Vitals:   04/10/18 0628  TempSrc:   PainSc: 0-No pain         Complications: No apparent anesthesia complications

## 2018-04-10 NOTE — Progress Notes (Signed)
  CT Surgery PM  Extubated, neuro intact Cardiac index 3.1 On neo 80- reduce milrinone to .25 6hr labs ok

## 2018-04-10 NOTE — H&P (Signed)
[address]       [place] [address]             [phone]          CARDIOTHORACIC SURGERY HISTORY AND PHYSICAL EXAM  Referring Provider is Larey Dresser, MD Primary Cardiologist is Minus Breeding, MD PCP is Unk Pinto, MD      Chief Complaint  Patient presents with  . Aortic Stenosis    eval for AVR...ECHO 03/02/18. 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  Patient's cardiac history dates back to 2004 when he was hospitalized with symptoms of congestive heart failure.  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  He was noted to have a bicuspid aortic valve with what was felt to be mild to moderate aortic stenosis at that time.  He was seen in follow-up in 2008 at which time reportedly left ventricular systolic function had recovered with ejection fraction estimated 60 to 65%.  There was moderate aortic stenosis with mean transvalvular gradient reported 23 mmHg at that time.  Stress perfusion study performed at that time was felt to be low risk.  He was lost to medical follow-up until recently.  Over the past 4 to 6 months the patient has developed progressive symptoms of exertional shortness of breath and fatigue.  In early January he was evaluated in the emergency department with an episode of supraventricular tachycardia.  He was treated  and released and has subsequently been followed carefully by Dr. Percival Spanish.  Transthoracic echocardiogram performed March 02, 2018 revealed severe left ventricular systolic dysfunction with ejection fraction estimated 25 to 30%.  There was bicuspid aortic valve with severe low gradient, low ejection fraction aortic stenosis.  Peak velocity across the aortic valve was measured 3.6 m/s corresponding to mean transvalvular gradient estimated 33 mmHg.  The DVI was 0.2 with aortic valve area calculated 0.68 cm.  He was scheduled for elective diagnostic cardiac catheterization which was performed March 28, 2018.  The patient was found to have mild nonobstructive coronary artery disease.  The aortic valve was not crossed.  There was moderate to severe pulmonary hypertension with PA pressures measured 70/32 with pulmonary capillary wedge pressure of 30, mixed venous oxygen saturation 61%, and cardiac index only 1.72 L/min/m.  The patient was hospitalized for acute exacerbation of chronic systolic congestive heart failure.  He improved from a symptomatic standpoint with medical therapy for heart failure.  He was seen in consultation by Dr. Angelena Form from the multidisciplinary heart valve team.  CT angiography was performed and the patient has been referred for elective surgical consultation.  Patient is married and lives locally in [place] with his partner and husband.  He has 2 adult children, 1 of whom accompanies him for his office consultation today.  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  Although he does not exercise on a regular basis he has remained reasonably active physically and functionally independent until recently.  He  reports that his only functional limitation is that of progressive exertional shortness of breath and fatigue, all of which seem to begin last fall.  Symptoms slowly progressed and have been associated with a persistent cough.  He had 1 syncopal  episode last fall for which he did not seek medical attention.  He has never had any chest pain or chest tightness either with activity or at rest.  He has had some lower extremity edema.  He has not experienced PND or orthopnea.  Symptoms have improved since his recent hospitalization.  Since hospital discharge weight has been stable.  He still gets short of breath with activity but not quite as bad as it had been previously.  He states that he quit smoking approximately 2 weeks ago.  He drinks alcohol on a daily basis.  He was originally seen in consultation on 04/02/2018 and since then he underwent dental extraction last week.      Past Medical History:  Diagnosis Date  . Anxiety   . Aortic stenosis   . CAD (coronary artery disease)   . Dilated aortic root ([place])   . Hypertension   . Nonischemic cardiomyopathy ([place])   . SVT (supraventricular tachycardia) ([place])   . Viral myocarditis 2004  . Vitamin D deficiency     Past Surgical History:  Procedure Laterality Date  . HERNIA REPAIR Left 2010  . MULTIPLE EXTRACTIONS WITH ALVEOLOPLASTY N/A 04/05/2018   Procedure: Extraction of tooth #'s 3, 12, 14,19, 23, 31 and 32 with alveoloplasty and gross debridement of remaining teeth;  Surgeon: Lenn Cal, DDS;  Location: [facility];  Service: Oral Surgery;  Laterality: N/A;  . ORCHIECTOMY Left 1982  . RIGHT HEART CATH AND CORONARY ANGIOGRAPHY N/A 03/28/2018   Procedure: RIGHT HEART CATH AND CORONARY ANGIOGRAPHY;  Surgeon: Larey Dresser, MD;  Location: [facility];  Service: Cardiovascular;  Laterality: N/A;  . VARICOSE VEIN SURGERY Left 05/1998    Family History  Problem Relation Age of Onset  . Diabetes Mother   . Hypertension Mother   . Cancer Mother        breast  . Alzheimer's disease Mother   . Hypertension Father   . Cancer Father        head and neck  . Hypertension Sister     Social History Social History   Tobacco Use  . Smoking status: Former Smoker    Years:  30.00    Types: Cigarettes    Last attempt to quit: 03/28/2018    Years since quitting: 0.0  . Smokeless tobacco: Never Used  Substance Use Topics  . Alcohol use: Not Currently  . Drug use: Never    Prior to Admission medications   Medication Sig Start Date End Date Taking? Authorizing Provider  acetaminophen (TYLENOL) 500 MG tablet Take 1,000 mg by mouth every 6 (six) hours as needed for mild pain or headache.    [provider]  ALPRAZolam Duanne Moron) 0.5 MG tablet Take 1 tablet (0.5 mg total) by mouth at bedtime as needed for anxiety. Patient taking differently: Take 0.25 mg by mouth at bedtime.  02/12/18   Liane Comber, NP  aspirin 81 MG chewable tablet Chew 81 mg by mouth daily.     [provider]  busPIRone (BUSPAR) 10 MG tablet Take 1 tablet (10 mg total) by mouth 3 (three) times daily. Patient taking differently: Take 10 mg by mouth at bedtime.  02/12/18   Liane Comber, NP  Cholecalciferol (VITAMIN D3)  125 MCG (5000 UT) TABS Take 20,000 Units by mouth daily.     [provider]  digoxin (LANOXIN) 0.125 MG tablet Take 1 tablet (0.125 mg total) by mouth daily. 03/31/18   Larey Dresser, MD  fluticasone furoate-vilanterol (BREO ELLIPTA) 100-25 MCG/INH AEPB Inhale 1 puff into the lungs daily. Rinse mouth with water after each use Patient not taking: Reported on 03/26/2018 11/30/17   Vicie Mutters, PA-C  HYDROcodone-acetaminophen (NORCO) 5-325 MG tablet Take 1 tablet by mouth every 4 (four) hours as needed for moderate pain or severe pain. 04/05/18   Lenn Cal, DDS  HYDROcodone-acetaminophen (NORCO) 5-325 MG tablet Take 1 tablet by mouth every 4 (four) hours as needed for moderate pain or severe pain. 04/05/18   Lenn Cal, DDS  metoprolol succinate (TOPROL-XL) 50 MG 24 hr tablet Take 1 tablet (50 mg total) by mouth 2 (two) times daily. Take with or immediately following a meal. 03/30/18   Larey Dresser, MD  potassium chloride SA  (K-DUR,KLOR-CON) 20 MEQ tablet Take 1 tablet (20 mEq total) by mouth 2 (two) times daily. 03/30/18   Larey Dresser, MD  rosuvastatin (CRESTOR) 10 MG tablet Take 1 tablet (10 mg total) by mouth daily at 6 PM. 03/30/18   Larey Dresser, MD  sacubitril-valsartan (ENTRESTO) 24-26 MG Take 1 tablet by mouth 2 (two) times daily. 03/30/18   Clegg, Amy D, NP  sertraline (ZOLOFT) 100 MG tablet Take 1 tablet (100 mg total) by mouth daily. Patient taking differently: Take 100 mg by mouth at bedtime.  02/12/18   Liane Comber, NP  spironolactone (ALDACTONE) 25 MG tablet Take 0.5 tablets (12.5 mg total) by mouth daily. 03/31/18   Larey Dresser, MD  torsemide (DEMADEX) 20 MG tablet Take 2 tablets (40 mg total) by mouth daily. 03/31/18   Larey Dresser, MD    No Known Allergies    Review of Systems:              General:                      normal appetite, decreased energy, no weight gain, no weight loss, no fever             Cardiac:                       no chest pain with exertion, no chest pain at rest, + SOB with exertion, + resting SOB, no PND, no orthopnea, no palpitations, + arrhythmia, no atrial fibrillation, + LE edema, + dizzy spells, + syncope             Respiratory:                 + shortness of breath, no home oxygen, + productive cough, + dry cough, no bronchitis, no wheezing, no hemoptysis, no asthma, no pain with inspiration or cough, no sleep apnea, no CPAP at night             GI:                               no difficulty swallowing, no reflux, no frequent heartburn, no hiatal hernia, no abdominal pain, no constipation, no diarrhea, no hematochezia, no hematemesis, no melena             GU:  no dysuria,  no frequency, no urinary tract infection, no hematuria, no enlarged prostate, no kidney stones, no kidney disease             Vascular:                     no pain suggestive of claudication, no pain in feet, no leg cramps, + varicose veins, no DVT, no  non-healing foot ulcer             Neuro:                         no stroke, no TIA's, no seizures, no headaches, no temporary blindness one eye,  no slurred speech, no peripheral neuropathy, no chronic pain, no instability of gait, no memory/cognitive dysfunction             Musculoskeletal:         no arthritis, no joint swelling, no myalgias, no difficulty walking, normal mobility              Skin:                            no rash, no itching, no skin infections, no pressure sores or ulcerations             Psych:                         + anxiety, no depression, no nervousness, no unusual recent stress             Eyes:                           no blurry vision, no floaters, no recent vision changes, does not wear glasses or contacts             ENT:                            no hearing loss, no loose or painful teeth, no dentures, last saw dentist many years ago             Hematologic:               no easy bruising, no abnormal bleeding, no clotting disorder, no frequent epistaxis             Endocrine:                   no diabetes, does not check CBG's at home                                                       Physical Exam:              BP (!) 80/60 (BP Location: Right Arm, Patient Position: Sitting, Cuff Size: Large)   Pulse 78   Resp 16   Ht 6\' 3"  (1.905 m)   Wt 221 lb (100.2 kg)   SpO2 96% Comment: ON RA  BMI 27.62 kg/m              General:  well-appearing             HEENT:                       Unremarkable              Neck:                           no JVD, no bruits, no adenopathy              Chest:                          clear to auscultation, symmetrical breath sounds, no wheezes, no rhonchi              CV:                              RRR, grade III/VI crescendo/decrescendo murmur heard best at LLSB,  no diastolic murmur             Abdomen:                    soft, non-tender, no masses              Extremities:                  warm, well-perfused, pulses diminished but palpable, no LE edema, + varicose veins             Rectal/GU                   Deferred             Neuro:                         Grossly non-focal and symmetrical throughout             Skin:                            Clean and dry, no rashes, no breakdown   Diagnostic Tests:  Transthoracic Echocardiography  Patient: [place], Tyler Berger MR #: [id number] Study Date: 03/02/2018 Gender: M Age: 72 Height: 190.5 cm Weight: 108.2 kg BSA: 2.41 m^2 Pt. Status: Room:  ATTENDING Minus Breeding, MD ORDERING Minus Breeding, MD [place], MD [address], Outpatient  cc:  ------------------------------------------------------------------- LV EF: 25% - 30%  ------------------------------------------------------------------- Indications: Q23.1 Bicuspid aortic valve.  ------------------------------------------------------------------- History: PMH: Tachycardia. Viral myocarditis. Aortic stenosis. Risk factors: Current tobacco use. Hypertension.  ------------------------------------------------------------------- Study Conclusions  - Procedure narrative: Transthoracic echocardiography. Image quality was adequate. The study was technically difficult. - Left ventricle: The cavity size was moderately dilated. Wall thickness was increased in a pattern of mild LVH. Systolic function was severely reduced. The estimated ejection fraction was in the range of 25% to 30%. Diffuse hypokinesis. Left ventricular diastolic function parameters were normal. - Aortic valve: Poorly visualized previously described as bicuspid Severe calcification and severe AS. Valve area (VTI): 0.77 cm^2. Valve area (Vmax): 0.7 cm^2. Valve area (Vmean): 0.68 cm^2. - Left atrium: The atrium was moderately dilated. - Atrial septum: No defect or  patent foramen ovale was identified.  ------------------------------------------------------------------- Study data: Comparison was made to the study of 08/02/2006. Study status: Routine.  Procedure: The patient reported no pain pre or post test. Transthoracic echocardiography. Image quality was adequate. The study was technically difficult. Study completion: There were no complications. Transthoracic echocardiography. M-mode, complete 2D, spectral Doppler, and color Doppler. Birthdate: Patient birthdate: 28-Apr-1960. Age: Patient is 58 yr old. Sex: Gender: male. BMI: 29.8 kg/m^2. Blood pressure: 106/72 Patient status: Outpatient. Study date: Study date: 03/02/2018. Study time: 09:38 AM. Location: [facility]  -------------------------------------------------------------------  ------------------------------------------------------------------- Left ventricle: The cavity size was moderately dilated. Wall thickness was increased in a pattern of mild LVH. Systolic function was severely reduced. The estimated ejection fraction was in the range of 25% to 30%. Diffuse hypokinesis. The transmitral flow pattern was normal. The deceleration time of the early transmitral flow velocity was normal. The pulmonary vein flow pattern was normal. The tissue Doppler parameters were normal. Left ventricular diastolic function parameters were normal.  ------------------------------------------------------------------- Aortic valve: Poorly visualized previously described as bicuspid Severe calcification and severe AS. Doppler: VTI ratio of LVOT to aortic valve: 0.22. Valve area (VTI): 0.77 cm^2. Indexed valve area (VTI): 0.32 cm^2/m^2. Peak velocity ratio of LVOT to aortic valve: 0.2. Valve area (Vmax): 0.7 cm^2. Indexed valve area (Vmax): 0.29 cm^2/m^2. Mean velocity ratio of LVOT to aortic valve: 0.2. Valve area (Vmean): 0.68 cm^2. Indexed valve area  (Vmean): 0.28 cm^2/m^2. Mean gradient (S): 33 mm Hg. Peak gradient (S): 52 mm Hg.  ------------------------------------------------------------------- Aorta: The aorta was normal, not dilated, and non-diseased.  ------------------------------------------------------------------- Mitral valve: Mildly thickened leaflets . Doppler: There was trivial regurgitation.  ------------------------------------------------------------------- Left atrium: The atrium was moderately dilated.  ------------------------------------------------------------------- Atrial septum: No defect or patent foramen ovale was identified.  ------------------------------------------------------------------- Right ventricle: The cavity size was normal. Wall thickness was normal. Systolic function was normal.  ------------------------------------------------------------------- Pulmonic valve: Doppler: There was mild regurgitation.  ------------------------------------------------------------------- Tricuspid valve: Doppler: There was mild regurgitation.  ------------------------------------------------------------------- Right atrium: The atrium was normal in size.  ------------------------------------------------------------------- Pericardium: The pericardium was normal in appearance.  ------------------------------------------------------------------- Systemic veins: Inferior vena cava: The vessel was normal in size. The respirophasic diameter changes were in the normal range (>= 50%), consistent with normal central venous pressure.  ------------------------------------------------------------------- Post procedure conclusions Ascending Aorta:  - The aorta was normal, not dilated, and non-diseased.  ------------------------------------------------------------------- Measurements  Left ventricle Value Reference LV ID, ED, PLAX  chordal (H) 57.1 mm 43 - 52 LV ID, ES, PLAX chordal (H) 46.8 mm 23 - 38 LV fx shortening, PLAX chordal (L) 18 % >=29 LV PW thickness, ED 12.7 mm ---------- IVS/LV PW ratio, ED 1.01 <=1.3 Stroke volume, 2D 62 ml ---------- Stroke volume/bsa, 2D 26 ml/m^2 ---------- LV e&', medial 5.99 cm/s ---------- Longitudinal strain, TDI 9 % ----------  Ventricular septum Value Reference IVS thickness, ED 12.8 mm ----------  LVOT Value Reference LVOT ID, S 21 mm ---------- LVOT area 3.46 cm^2 ---------- LVOT peak velocity, S 73.7 cm/s ---------- LVOT mean velocity, S 52 cm/s ---------- LVOT VTI, S 17.8 cm ----------  Aortic valve Value Reference Aortic valve peak velocity, S 362 cm/s ---------- Aortic valve mean velocity, S 263 cm/s ---------- Aortic valve VTI, S 79.5 cm ---------- Aortic mean gradient, S 33 mm Hg ---------- Aortic peak gradient, S 52 mm Hg ---------- VTI ratio, LVOT/AV 0.22 ---------- Aortic valve area, VTI 0.77 cm^2 ---------- Aortic valve area/bsa, VTI 0.32 cm^2/m^2 ---------- Velocity ratio, peak, LVOT/AV 0.2 ---------- Aortic valve area, peak velocity 0.7 cm^2 ---------- Aortic valve area/bsa, peak  0.29 cm^2/m^2 ---------- velocity Velocity ratio, mean, LVOT/AV 0.2 ----------  Aortic valve area, mean velocity 0.68 cm^2 ---------- Aortic valve area/bsa, mean 0.28 cm^2/m^2 ---------- velocity Aortic regurg peak velocity 309 cm/s ---------- Aortic regurg pressure half-time 193 ms ---------- Aortic regurg peak gradient 38 mm Hg ----------  Aorta Value Reference Aortic root ID, ED 37 mm ----------  Left atrium Value Reference LA ID, A-P, ES 51 mm ---------- LA ID/bsa, A-P 2.11 cm/m^2 <=2.2 LA volume, S 66.4 ml ---------- LA volume/bsa, S 27.5 ml/m^2 ---------- LA volume, ES, 1-p A4C 64.6 ml ---------- LA volume/bsa, ES, 1-p A4C 26.8 ml/m^2 ---------- LA volume, ES, 1-p A2C 64.7 ml ---------- LA volume/bsa, ES, 1-p A2C 26.8 ml/m^2 ----------  Right atrium Value Reference RA ID, S-I, ES, A4C 46.7 mm 34 - 49 RA area, ES, A4C 16.9 cm^2 8.3 - 19.5 RA volume, ES, A/L 48.7 ml ---------- RA volume/bsa, ES, A/L 20.2 ml/m^2 ----------  Right ventricle Value Reference RV s&', lateral, S 11 cm/s ----------  Legend: (L) and (H) mark values outside specified reference range.  ------------------------------------------------------------------- Prepared and Electronically Authenticated by  Jenkins Rouge,  M.D. 2020-01-24T11:23:10    RIGHT HEART CATH AND CORONARY ANGIOGRAPHY  Conclusion   1. Markedly elevated PCWP.  2. Decreased cardiac output.  3. 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  4. Nonobstructive CAD.   Nonischemic cardiomyopathy. Reviewed initial echo, looks like severe aortic stenosis. With marked PCWP elevation, did not cross valve and plan to admit, diurese, and proceed with careful limited echo for AS to confirm severity (possibly TEE if needed).   Procedural Details   Technical Details Procedure: Right Heart Cath, Selective Coronary Angiography  Indication: Aortic stenosis, CHF   Procedural Details: The right brachial and radial areas were prepped, draped, and anesthetized with 1% lidocaine. There was a pre-existing peripheral IV that was replaced with a 58F venous sheath. A Swan-Ganz catheter was used for the right heart catheterization. Standard protocol was followed for recording of right heart pressures and sampling of oxygen saturations. Fick cardiac output was calculated. The right radial artery was entered using modified Seldinger technique and a 49F sheath was placed. The patient received 3 mg IA verapamil and weight-based IV heparin. JL3.5 and AR1 were used for selective coronary angiography. There were no immediate procedural complications. The patient was transferred to the post catheterization recovery area for further monitoring.   Estimated blood loss <50 mL.   During this procedure medications were administered to achieve and maintain moderate conscious sedation while the patient's heart rate, blood pressure, and oxygen saturation were continuously monitored and I was present face-to-face 100% of this time.  Medications  (Filter: Administrations occurring from 03/28/18 0820 to 03/28/18 0955)          Medication Rate/Dose/Volume Action  Date Time   Heparin (Porcine) in NaCl 1000-0.9 UT/500ML-% SOLN (mL) 500 mL Given 03/28/18  0836   Total dose as of 04/02/18 1128 500 mL Given 0836   1,500 mL 500 mL Given 0836   fentaNYL (SUBLIMAZE) injection (mcg) 12.5 mcg Given 03/28/18 0849   Total dose as of 04/02/18 1128 12.5 mcg Given 0859   50 mcg 25 mcg Given 0906   midazolam (VERSED) injection (mg) 0.5 mg Given 03/28/18 0849   Total dose as of 04/02/18 1128 0.5 mg Given 0859   2 mg 1 mg Given 0906   lidocaine (PF) (XYLOCAINE) 1 % injection (mL) 2 mL Given 03/28/18 0857   Total dose as of 04/02/18 1128 2 mL Given 0904   4 mL  Radial Cocktail/Verapamil only (mL) 10 mL Given 03/28/18 0915   Total dose as of 04/02/18 1128        10 mL        heparin injection (Units) 5,000 Units Given 03/28/18 0919   Total dose as of 04/02/18 1128        5,000 Units        iohexol (OMNIPAQUE) 350 MG/ML injection (mL) 135 mL Given 03/28/18 0951   Total dose as of 04/02/18 1128        135 mL        Sedation Time   Sedation Time Physician-1: 52 minutes 36 seconds  Coronary Findings   Diagnostic  Dominance: Right  Left Main  No significant disease.  Left Anterior Descending  Small D1 with 80% proximal stenosis. Moderate D2 with 50% proximal stenosis. Luminal irregularities in the LAD.  Left Circumflex  50% mid LCx stenosis.  Right Coronary Artery  50% distal RCA stenosis.  Intervention   No interventions have been documented.  Right Heart   Right Heart Pressures [facility] Procedural Findings: Hemodynamics (mmHg) RA mean 12 RV 64/17 PA 70/32, mean 47 PCWP mean 30  Oxygen saturations: PA 61% AO 97%  Cardiac Output (Fick) 4.05  Cardiac Index (Fick) 1.72 PVR 4.2 WU  Implants    No implant documentation for this case.  Syngo Images   Show images for CARDIAC CATHETERIZATION  MERGE Images   Show images for CARDIAC CATHETERIZATION   Link to Procedure Log   Procedure Log    Hemo Data    Most Recent Value  Fick Cardiac Output 4.05 L/min  Fick  Cardiac Output Index 1.72 (L/min)/BSA  RA A Wave 17 mmHg  RA V Wave 19 mmHg  RA Mean 12 mmHg  RV Systolic Pressure 64 mmHg  RV Diastolic Pressure 8 mmHg  RV EDP 17 mmHg  PA Systolic Pressure 70 mmHg  PA Diastolic Pressure 32 mmHg  PA Mean 46 mmHg  PW A Wave 35 mmHg  PW V Wave 32 mmHg  PW Mean 29 mmHg  AO Systolic Pressure 89 mmHg  AO Diastolic Pressure 62 mmHg  AO Mean 75 mmHg  QP/QS 1  TPVR Index 27.31 HRUI  TSVR Index 43.59 HRUI  PVR SVR Ratio 0.29  TPVR/TSVR Ratio 0.63    Cardiac TAVR CT  TECHNIQUE: The patient was scanned on a Siemens Force 161 slice scanner. A 120 kV retrospective scan was triggered in the descending thoracic aorta at 111 HU's. Gantry rotation speed was 270 msecs and collimation was .9 mm. No beta blockade or nitro were given. The 3D data set was reconstructed in 5% intervals of the R-R cycle. Systolic and diastolic phases were analyzed on a dedicated work station using MPR, MIP and VRT modes. The patient received 80 cc of contrast.  FINDINGS: Aortic Valve: Bicuspid with heavy calcium fusion of right and left cusps. There is large nodular area of calcification at base of both leaflets  Aorta: Dilated root normal arch vessels no coarctation  Sinotubular Junction: 34 mm  Ascending Thoracic Aorta: 43 mm  Aortic Arch: 28 mm  Descending Thoracic Aorta: 23 mm  Sinus of Valsalva Measurements:  Non-coronary: 35.7 mm  Right - coronary: 38.2 mm  Left - coronary: 37.4 mm  Coronary Artery Height above Annulus:  Left Main: 13.8 mm above annulus  Right Coronary: 17 mm above annulus  Virtual Basal Annulus Measurements:  Maximum/Minimum Diameter: 30 mm x 36 mm  Perimeter: 113 mm  Area: 946  mm2  Coronary Arteries: Sufficient height above annulus for deployment but concern for LM as fused cusp heavily calcified and broad would appear to be at risk for occlusion  Optimum Fluoroscopic Angle for Delivery: LAO 14 mm  CAudal 14 mm  IMPRESSION: 1. Heavily calcified bicuspid valve with annular area of 946 mm2 would appear to be too large for Medtronic or Sapien 3 valve. The dense opposed nodular calcium in the annulus also Would appear to increase risk of peri valvular regurgitation  2. Coronary arteries sufficient height above annulus for deployment by traditional criteria but concern for broad based fused R/L leaflet being heavily calcified and potential obstruction of LM  3. Optimum angiographic angle for deployment LAO 14 Caudal 14 degrees  4. Dilated aortic root 4.3 cm  Jenkins Rouge   Electronically Signed By: Jenkins Rouge M.D. On: 03/29/2018 16:46    CT ANGIOGRAPHY CHEST, ABDOMEN AND PELVIS  TECHNIQUE: Multidetector CT imaging through the chest, abdomen and pelvis was performed using the standard protocol during bolus administration of intravenous contrast. Multiplanar reconstructed images and MIPs were obtained and reviewed to evaluate the vascular anatomy.  CONTRAST: 127mL ISOVUE-370 IOPAMIDOL (ISOVUE-370) INJECTION 76%  COMPARISON: CT the abdomen and pelvis 12/14/2017. Chest CT 08/17/2016.  FINDINGS: CTA CHEST FINDINGS  Cardiovascular: Heart size is mildly enlarged. There is no significant pericardial fluid, thickening or pericardial calcification. There is aortic atherosclerosis, as well as atherosclerosis of the great vessels of the mediastinum and the coronary arteries, including calcified atherosclerotic plaque in the left main, left anterior descending, left circumflex and right coronary arteries. Severe thickening calcification of the aortic valve.  Mediastinum/Lymph Nodes: No pathologically enlarged mediastinal or hilar lymph nodes. Esophagus is unremarkable in appearance. No axillary lymphadenopathy.  Lungs/Pleura: No suspicious appearing pulmonary nodules or masses are noted. No acute consolidative airspace disease. Small  bilateral pleural effusions lying dependently (right greater than left).  Musculoskeletal/Soft Tissues: There are no aggressive appearing lytic or blastic lesions noted in the visualized portions of the skeleton.  CTA ABDOMEN AND PELVIS FINDINGS  Hepatobiliary: No suspicious cystic or solid hepatic lesions. No intra or extrahepatic biliary ductal dilatation. Gallbladder is normal in appearance.  Pancreas: No pancreatic mass. No pancreatic ductal dilatation. No pancreatic or peripancreatic fluid or inflammatory changes.  Spleen: Visualized portions are unremarkable.  Adrenals/Urinary Tract: 1.7 cm low-attenuation lesion in the anterior aspect of the interpolar region of the right kidney, compatible with a simple cyst. There are 2 intermediate to high attenuation lesions in the left kidney, measuring up to 2.2 cm in the lower pole of the left kidney, stable compared to the prior examination 12/14/2017, statistically likely to represent a proteinaceous/hemorrhagic cysts. Other subcentimeter low-attenuation lesions in the kidneys, too small to characterize, but statistically likely to represent tiny cysts. Bilateral adrenal glands are normal in appearance. No hydroureteronephrosis. Urinary bladder is normal in appearance.  Stomach/Bowel: Normal appearance of the stomach. No pathologic dilatation of small bowel or colon. Normal appendix.  Vascular/Lymphatic: Aortic atherosclerosis, without evidence of aneurysm or dissection in the abdominal or pelvic vasculature. Vascular findings and measurements pertinent to potential TAVR procedure, as detailed below. No lymphadenopathy noted in the abdomen or pelvis.  Reproductive: Prostate gland and seminal vesicles are unremarkable in appearance.  Other: No significant volume of ascites. No pneumoperitoneum.  Musculoskeletal: There are no aggressive appearing lytic or blastic lesions noted in the visualized portions of the  skeleton.  VASCULAR MEASUREMENTS PERTINENT TO TAVR:  AORTA:  Minimal Aortic Diameter-17 x 16 mm  Severity of  Aortic Calcification-mild-to-moderate  RIGHT PELVIS:  Right Common Iliac Artery -  Minimal Diameter-10.1 x 8.6 mm  Tortuosity-mild  Calcification-moderate  Right External Iliac Artery -  Minimal Diameter-7.4 x 5.9 mm  Tortuosity - mild  Calcification-none  Right Common Femoral Artery -  Minimal Diameter-7.8 x 6.6 mm  Tortuosity - mild  Calcification-mild  LEFT PELVIS:  Left Common Iliac Artery -  Minimal Diameter-10.2 x 8.4 mm  Tortuosity-severe  Calcification-mild  Left External Iliac Artery -  Minimal Diameter-8.0 x 7.7 mm  Tortuosity-moderate  Calcification-mild  Left Common Femoral Artery -  Minimal Diameter-7.2 x 8.0 mm  Tortuosity-mild  Calcification-mild  Review of the MIP images confirms the above findings.  IMPRESSION: 1. Vascular findings and measurements pertinent to potential TAVR procedure, as detailed above. 2. Severe thickening calcification of the aortic valve, compatible with the reported clinical history of severe aortic stenosis. 3. Aortic atherosclerosis, in addition to left main and 3 vessel coronary artery disease. Please note that although the presence of coronary artery calcium documents the presence of coronary artery disease, the severity of this disease and any potential stenosis cannot be assessed on this non-gated CT examination. Assessment for potential risk factor modification, dietary therapy or pharmacologic therapy may be warranted, if clinically indicated. 4. Small bilateral pleural effusions (right greater than left). 5. Additional incidental findings, as above.   Electronically Signed By: Vinnie Langton M.D. On: 03/29/2018 15:45   EKG:   NSR w/with baseline LBBB     Impression:  Patient has bicuspid aortic valve with stage D severe  symptomatic aortic stenosis, aneurysmal enlargement of the aortic root and severe global left ventricular systolic dysfunction.  He presents with recent acute exacerbation of chronic systolic congestive heart failure, New [place] Heart Association functional class IV.  Symptoms have responded well to medical therapy.  I personally reviewed the patient's recent transthoracic echocardiograms, diagnostic cardiac catheterization, and CT angiograms.  The patient has a Sievers type I bicuspid aortic valve with fusion of the left and right cusps of the valve.  There is severe calcification involving both leaflets and the annulus itself.  Peak velocity across the aortic valve measured 3.6 m/s despite the presence of severe global left ventricular systolic dysfunction and ejection fraction estimated only 25%.  The DVI was reportedly quite low and aortic valve area was estimated only 0.68 cm.  Diagnostic cardiac catheterization is notable for the presence of mild nonobstructive coronary artery disease.  Cardiac output was low and pulmonary artery pressures moderate to severely elevated at the time of his recent catheterization.  He was notably an acute exacerbation of chronic heart failure at that time.  There is no question the patient needs aortic valve replacement.  CT angiography confirms the presence of aneurysmal enlargement of the entire aortic root.  The overall size of the aortic valve and aortic annulus is much too large to accommodate any of the currently commercially available transcatheter heart valves.  Moreover, there is severe annular calcification which would further increase the likelihood of significant paravalvular leak.  Finally, the patient has aneurysmal enlargement of the aortic root which would put the patient at risk of aortic dissection in the future if his aneurysm was left untreated.  Given the patient's relatively young age I feel that an aggressive approach is warranted and he should undergo  Bentall aortic root replacement.  Risks associated with surgery will clearly be increased because of the severity of left ventricular systolic dysfunction.  Furthermore, the patient has longstanding history of tobacco  abuse and significant alcohol use.  The patient has baseline left bundle branch block on EKG and may be at somewhat increased risk for the development of complete heart block postoperatively.  Finally, the patient has known history of supraventricular tachycardia and will likely remain at significant risk for atrial dysrhythmias early following surgery.    Plan:  The patient, his husband and his son were counseled at length regarding treatment alternatives for management of severe symptomatic aortic stenosis with aneurysmal enlargement of the aortic root. Alternative approaches such as conventional aortic valve replacement with or without aortic root replacement, transcatheter aortic valve replacement, and continued medical therapy without intervention were compared and contrasted at length.  The risks associated with conventional surgical aortic root replacement were discussed in detail, as were expectations for post-operative convalescence.  Discussion was held comparing the relative risks of mechanical valve replacement with need for lifelong anticoagulation versus use of a bioprosthetic tissue valve and the associated potential for late structural valve deterioration and failure.  This discussion was placed in the context of the patient's particular circumstances, and as a result the patient specifically requests that their valve be replaced using a bioprosthetic tissue valve.  The patient understands and accepts all potential associated risks of surgery including but not limited to risk of death, stroke, myocardial infarction, congestive heart failure, respiratory failure, renal failure, pneumonia, bleeding requiring blood transfusion and or reexploration, arrhythmia, heart block or  bradycardia requiring permanent pacemaker, aortic dissection or other major vascular complication, pleural effusions or other delayed complications related to continued congestive heart failure, and other late complications related to valve replacement including structural valve deterioration and failure, thrombosis, endocarditis, or paravalvular leak.  We tentatively plan to proceed with surgery on Tuesday April 10, 2018.  Prior to the surgery the patient will be referred for dental service consultation for routine examination and cleaning.  All of the patient's questions have been addressed.       Valentina Gu. Roxy Manns, MD 04/02/2018 10:04 AM

## 2018-04-10 NOTE — Op Note (Signed)
CARDIOTHORACIC SURGERY OPERATIVE NOTE  Date of Procedure:  04/10/2018  Preoperative Diagnosis:   Bicuspid Aortic Valve  Severe Aortic Stenosis   Ascending Aortic Aneurysm  Postoperative Diagnosis: Same   Procedure:    Aortic Valve Replacement  Edwards Inspiris Resilia stented bovine pericardial tissue valve (size 29 mm, ref # [id number], serial # [id number])    Repair Ascending Thoracic Aortic Aneurysm  Supra-coronary straight graft using Hemashield Platinum woven double velour vascular graft (size 26 mm, ref # [id number] P0, serial # [id number])  Surgeon: Valentina Gu. 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  Patient's cardiac history dates back to 2004 when he was hospitalized with symptoms of congestive heart failure. 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 He was noted to have a bicuspid aortic valve with what was felt to be mild to moderate aortic stenosis at that time. He was seen in follow-up in 2008at which time reportedly left ventricular systolic function  had recovered with ejection fraction estimated 60 to 65%. There was moderate aortic stenosis with mean transvalvular gradient reported 23 mmHg at that time. Stress perfusion study performed at that time was felt to be low risk. He was lost to medical follow-up until recently.  Over the past 4 to 6 months the patient has developed progressive symptoms of exertional shortness of breath and fatigue. In early January he was evaluated in the emergency department with an episode of supraventricular tachycardia. He was treated and released and has subsequently been followed carefully by Dr. Percival Spanish. Transthoracic echocardiogram performed March 02, 2018 revealed severe left ventricular systolic dysfunction with ejection fraction estimated 25 to 30%. There was bicuspid aortic valve with severe low gradient, low ejection fraction aortic stenosis. Peak velocity across the aortic valve was measured 3.6 m/s corresponding to mean transvalvular gradient estimated 33 mmHg. The DVI was 0.2 with aortic valve area calculated 0.68cm. He was scheduled for elective diagnostic cardiac catheterization which was performed March 28, 2018. The patient was found to have mild nonobstructive coronary artery disease. The aortic valve was not crossed. There was moderate to severe pulmonary hypertension with PA pressures measured 70/32 with pulmonary capillary wedge pressure of 30, mixed venous oxygen saturation 61%, and cardiac index only 1.72 L/min/m. The patient was hospitalized for acute exacerbation of chronic systolic congestive heart failure. He improved from a symptomatic standpoint with medical therapy for heart failure. He was seen in consultation by Dr. Angelena Form from the multidisciplinary heart valve team. CT angiography was performed and the patient has been referred for elective surgical consultation.  The patient has been seen in consultation and counseled at length regarding the indications, risks and  potential benefits of surgery.  All questions have been answered,  and the patient provides full informed consent for the operation as described.    DETAILS OF THE OPERATIVE PROCEDURE  Preparation:  The patient is brought to the operating room on the above mentioned date and central monitoring was established by the anesthesia team including placement of Swan-Ganz catheter and radial arterial line.  There was severe pulmonary hypertension at baseline suggestive of acute exacerbation of chronic combined systolic and diastolic congestive heart failure.  The patient is placed in the supine position on the operating table.  Intravenous antibiotics are administered. General endotracheal anesthesia is induced uneventfully.  Attempts to place a Foley catheter are met with resistance.  Ultimately a 77 French coud catheter is placed without difficulty.  Baseline transesophageal echocardiogram was performed.  Findings were notable for severe global left ventricular systolic dysfunction.  Left ventricular ejection fraction was estimated 20 to 25%.  The aortic valve was bicuspid.  There was severe aortic stenosis with moderate aortic insufficiency.  There was trivial mitral regurgitation.  Right ventricular function was moderately reduced.  There was trivial tricuspid regurgitation.  The patient's chest, abdomen, both groins, and both lower extremities are prepared and draped in a sterile manner. A time out procedure is performed.   Surgical Approach:  A median sternotomy incision was performed and the pericardium is opened. The ascending aorta is dilated with fusiform aneurysmal enlargement.  The aorta tapers down to near normal diameter at the level of the takeoff of the innominate artery.   Extracorporeal Cardiopulmonary Bypass and Myocardial Protection:  The transverse aortic arch and the right atrium are cannulated for cardiopulmonary bypass.  Adequate heparinization is verified.   A retrograde  cardioplegia cannula is placed through the right atrium into the coronary sinus.  The operative field was continuously flooded with carbon dioxide gas.  The entire pre-bypass portion of the operation was notable for stable hemodynamics.  Cardiopulmonary bypass was begun and the surface of the heart is inspected.  A left ventricular vent is placed through the right superior pulmonary vein.  A cardioplegia cannula is placed in the ascending aorta.  A temperature probe was placed in the interventricular septum.  The patient is cooled to 32C systemic temperature.  The aortic cross clamp is applied at the level of the takeoff of the innominate artery and cardioplegia is delivered initially in an antegrade fashion through the aortic root using modified [place] cold blood cardioplegia ([place] blood cardioplegia protocol).   Because of significant aortic insufficiency, the majority of the resting dose is administered retrograde through the coronary sinus catheter.  The initial cardioplegic arrest is rapid with early diastolic arrest.   Myocardial protection was felt to be excellent.   Resection of Ascending Thoracic Aortic Aneurysm:  The entire ascending aorta is dissected away from the pulmonary artery.  The proximal aortic root is dissected and exposed.  The aorta is transected approximately 2 cm above the takeoff of the right coronary artery.  At this level the aortic diameter is notably less than 3 cm.  The remainder of the ascending aortic aneurysm is resected and the aorta transected approximately 1 cm below the aortic cross-clamp.    Aortic Valve Replacement:  The aortic valve was inspected and notably congenitally bicuspid.  The valve has a single raphae between the left and right leaflets, consistent with a Sievers type I bicuspid aortic valve.  There is severe aortic stenosis.  The degree of calcification of the aortic valve and annulus is extraordinarily severe.  The aortic valve leaflets  were excised sharply and the aortic annulus decalcified.  Decalcification was notably tedious.   A decision is made to proceed with supra coronary straight graft resection and grafting of the ascending aortic aneurysm instead of Bentall aortic root replacement.  The aortic annulus was sized to accept a 29 mm prosthesis.  The aortic root and left ventricle were irrigated with copious cold saline solution.  Aortic valve replacement was performed using interrupted horizontal mattress 2-0 Ethibond pledgeted sutures with pledgets in the subannular position.  An Edwards Inspiris Resilia stented bovine pericardial tissue valve (size 29 mm, ref # [id number], serial # [id number]) was implanted uneventfully.  All sutures were secured using a Cor-knot device.  The valve seated appropriately with adequate space beneath the left main and right coronary artery.   Replacement of Ascending Thoracic Aorta:  The ascending aorta is grafted using a 26 mm Hemashield platinum woven double velour vascular graft sewn in the supra coronary position.  The proximal suture line is constructed using interrupted horizontal mattress pledgeted 2-0 Ethibond sutures followed by running 4-0 Prolene.  The distal end of the graft is trimmed and beveled to an appropriate length and the distal suture line constructed using running 3-0 Prolene suture with a Teflon felt strip to buttress the suture line.   Procedure Completion:  One final dose of warm retrograde "reanimation dose" cardioplegia was administered retrograde through the coronary sinus catheter while all air was evacuated through the aortic graft.  The aortic cross clamp was removed after a total cross clamp time of 105 minutes.  Epicardial pacing wires are fixed to the right ventricular outflow tract and to the right atrial appendage. The patient is rewarmed to 37C temperature. The aortic and left ventricular vents are removed.  The patient is weaned and disconnected from  cardiopulmonary bypass.  The patient's rhythm at separation from bypass was sinus.  The patient was weaned from cardiopulmonary bypass on milrinone at 0.3 mcg/kg/min.. Total cardiopulmonary bypass time for the operation was 131 minutes.  Followup transesophageal echocardiogram performed after separation from bypass revealed a well-seated aortic valve prosthesis that was functioning normally and without any sign of perivalvular leak.  Left ventricular function was improved from preoperatively.  The aortic and venous cannula were removed uneventfully. Protamine was administered to reverse the anticoagulation. The mediastinum and pleural space were inspected for hemostasis and irrigated with saline solution. The mediastinum and the right pleural space were drained using 3 chest tubes placed through separate stab incisions inferiorly.  The soft tissues anterior to the aorta were reapproximated loosely. The sternum is closed with double strength sternal wire. The soft tissues anterior to the sternum were closed in multiple layers and the skin is closed with a running subcuticular skin closure.  The post-bypass portion of the operation was notable for stable rhythm and hemodynamics.  No blood products were administered during the operation.   Disposition:  The patient tolerated the procedure well and is transported to the surgical intensive care in stable condition. There are no intraoperative complications. All sponge instrument and needle counts are verified correct at completion of the operation.    Valentina Gu. Roxy Manns MD 04/10/2018 12:49 PM

## 2018-04-10 NOTE — Progress Notes (Signed)
Patient failed mechanics at first attempt. Patient was still drowsy. Precedex has been off since 1538.Will attempt around 1830.

## 2018-04-10 NOTE — Anesthesia Procedure Notes (Signed)
Central Venous Catheter Insertion Performed by: Effie Berkshire, MD, anesthesiologist Start/End3/04/2018 6:55 AM, 04/10/2018 7:05 AM Patient location: Pre-op. Preanesthetic checklist: patient identified, IV checked, site marked, risks and benefits discussed, surgical consent, monitors and equipment checked, pre-op evaluation, timeout performed and anesthesia consent Position: Trendelenburg Lidocaine 1% used for infiltration and patient sedated Hand hygiene performed , maximum sterile barriers used  and Seldinger technique used Catheter size: 9 Fr Total catheter length 10. Central line was placed.MAC introducer Swan type:thermodilution PA Cath depth:50 Procedure performed using ultrasound guided technique. Ultrasound Notes:anatomy identified, needle tip was noted to be adjacent to the nerve/plexus identified, no ultrasound evidence of intravascular and/or intraneural injection and image(s) printed for medical record Attempts: 1 Following insertion, line sutured and dressing applied. Post procedure assessment: blood return through all ports, free fluid flow and no air  Patient tolerated the procedure well with no immediate complications.

## 2018-04-10 NOTE — Procedures (Signed)
Extubation Procedure Note  Patient Details:   Name: Tyler Berger DOB: 07/29/1960 MRN: [id number]   Airway Documentation:  Airway 8 mm (Active)  Secured at (cm) 25 cm 04/10/2018  6:54 PM  Measured From Lips 04/10/2018  6:54 PM  Secured Location Right 04/10/2018  6:54 PM  Secured By Pink Tape 04/10/2018  6:54 PM  Site Condition Dry 04/10/2018  6:54 PM   Vent end date: 04/10/18 Vent end time: 2037   Evaluation  O2 sats: stable throughout Complications: No apparent complications Patient did tolerate procedure well. Bilateral Breath Sounds: Diminished   Yes    Patient performed VC of 1.9 L and NIF -40 with very good effort.  Patient had positive cuff leak and was extubated to 4 L [place].  Patient performed IS over 750 mL with good effort.  Carrington Clamp A 04/10/2018, 7:44 PM

## 2018-04-10 NOTE — Brief Op Note (Signed)
04/10/2018  12:44 PM  PATIENT:  Tyler Berger  58 y.o. male  PRE-OPERATIVE DIAGNOSIS:  AS AORTIC ROOT ANEURYSM  POST-OPERATIVE DIAGNOSIS:  AS AORTIC ROOT ANEURYSM  PROCEDURE:  Procedure(s): RESECTION AND GRAFTING OF ASCENDING AORTIC ROOT ANUERYSM (SUPRACORONARY) (N/A) TRANSESOPHAGEAL ECHOCARDIOGRAM (TEE) (N/A) Aortic Valve Replacement (Avr), USING INSPIRIS 29MM (N/A)  SURGEON:  Surgeon(s) and Role:    Rexene Alberts, MD - Primary  PHYSICIAN ASSISTANT:  Nicholes Rough, PA-C   ANESTHESIA:   general  EBL:  700 mL   BLOOD ADMINISTERED:none  DRAINS: ROUTINE   LOCAL MEDICATIONS USED:  NONE  SPECIMEN:  Source of Specimen:  AORTIC ANEURYSM  DISPOSITION OF SPECIMEN:  PATHOLOGY  COUNTS:  YES  DICTATION: .Dragon Dictation  PLAN OF CARE: Admit to inpatient   PATIENT DISPOSITION:  ICU - intubated and hemodynamically stable.   Delay start of Pharmacological VTE agent (>24hrs) due to surgical blood loss or risk of bleeding: yes

## 2018-04-11 ENCOUNTER — Inpatient Hospital Stay (HOSPITAL_COMMUNITY): Payer: Managed Care, Other (non HMO)

## 2018-04-11 ENCOUNTER — Encounter (HOSPITAL_COMMUNITY): Payer: Self-pay | Admitting: Thoracic Surgery (Cardiothoracic Vascular Surgery)

## 2018-04-11 DIAGNOSIS — Z953 Presence of xenogenic heart valve: Secondary | ICD-10-CM

## 2018-04-11 LAB — BASIC METABOLIC PANEL
ANION GAP: 5 (ref 5–15)
Anion gap: 7 (ref 5–15)
BUN: 13 mg/dL (ref 6–20)
BUN: 19 mg/dL (ref 6–20)
CO2: 22 mmol/L (ref 22–32)
CO2: 22 mmol/L (ref 22–32)
Calcium: 8.6 mg/dL — ABNORMAL LOW (ref 8.9–10.3)
Calcium: 8.7 mg/dL — ABNORMAL LOW (ref 8.9–10.3)
Chloride: 111 mmol/L (ref 98–111)
Chloride: 104 mmol/L (ref 98–111)
Creatinine, Ser: 0.75 mg/dL (ref 0.61–1.24)
Creatinine, Ser: 1.08 mg/dL (ref 0.61–1.24)
GFR calc Af Amer: >60 mL/min (ref >60)
GFR calc Af Amer: >60 mL/min (ref >60)
GFR calc non Af Amer: >60 mL/min (ref >60)
Glucose, Bld: 120 mg/dL — ABNORMAL HIGH (ref 70–99)
Glucose, Bld: 124 mg/dL — ABNORMAL HIGH (ref 70–99)
POTASSIUM: 4.6 mmol/L (ref 3.5–5.1)
Potassium: 4 mmol/L (ref 3.5–5.1)
SODIUM: 133 mmol/L — AB (ref 135–145)
Sodium: 138 mmol/L (ref 135–145)

## 2018-04-11 LAB — GLUCOSE, CAPILLARY
GLUCOSE-CAPILLARY: 122 mg/dL — AB (ref 70–99)
Glucose-Capillary: 101 mg/dL — ABNORMAL HIGH (ref 70–99)
Glucose-Capillary: 108 mg/dL — ABNORMAL HIGH (ref 70–99)
Glucose-Capillary: 110 mg/dL — ABNORMAL HIGH (ref 70–99)
Glucose-Capillary: 110 mg/dL — ABNORMAL HIGH (ref 70–99)
Glucose-Capillary: 118 mg/dL — ABNORMAL HIGH (ref 70–99)
Glucose-Capillary: 119 mg/dL — ABNORMAL HIGH (ref 70–99)
Glucose-Capillary: 122 mg/dL — ABNORMAL HIGH (ref 70–99)
Glucose-Capillary: 122 mg/dL — ABNORMAL HIGH (ref 70–99)
Glucose-Capillary: 122 mg/dL — ABNORMAL HIGH (ref 70–99)
Glucose-Capillary: 123 mg/dL — ABNORMAL HIGH (ref 70–99)
Glucose-Capillary: 124 mg/dL — ABNORMAL HIGH (ref 70–99)
Glucose-Capillary: 124 mg/dL — ABNORMAL HIGH (ref 70–99)
Glucose-Capillary: 131 mg/dL — ABNORMAL HIGH (ref 70–99)
Glucose-Capillary: 134 mg/dL — ABNORMAL HIGH (ref 70–99)
Glucose-Capillary: 141 mg/dL — ABNORMAL HIGH (ref 70–99)
Glucose-Capillary: 96 mg/dL (ref 70–99)

## 2018-04-11 LAB — CBC
HCT: 35.7 % — ABNORMAL LOW (ref 39.0–52.0)
HCT: 32.1 % — ABNORMAL LOW (ref 39.0–52.0)
Hemoglobin: 11.9 g/dL — ABNORMAL LOW (ref 13.0–17.0)
Hemoglobin: 10.5 g/dL — ABNORMAL LOW (ref 13.0–17.0)
MCH: 29.4 pg (ref 26.0–34.0)
MCH: 29.3 pg (ref 26.0–34.0)
MCHC: 33.3 g/dL (ref 30.0–36.0)
MCHC: 32.7 g/dL (ref 30.0–36.0)
MCV: 88.1 fL (ref 80.0–100.0)
MCV: 89.7 fL (ref 80.0–100.0)
NRBC: 0 % (ref 0.0–0.2)
PLATELETS: 60 10*3/uL — AB (ref 150–400)
Platelets: 78 10*3/uL — ABNORMAL LOW (ref 150–400)
RBC: 4.05 MIL/uL — AB (ref 4.22–5.81)
RBC: 3.58 MIL/uL — ABNORMAL LOW (ref 4.22–5.81)
RDW: 13.5 % (ref 11.5–15.5)
RDW: 13.5 % (ref 11.5–15.5)
WBC: 14.6 10*3/uL — AB (ref 4.0–10.5)
WBC: 11.2 10*3/uL — AB (ref 4.0–10.5)
nRBC: 0 % (ref 0.0–0.2)

## 2018-04-11 LAB — COOXEMETRY PANEL
Carboxyhemoglobin: 1.7 % — ABNORMAL HIGH (ref 0.5–1.5)
Methemoglobin: 1.8 % — ABNORMAL HIGH (ref 0.0–1.5)
O2 Saturation: 70 %
TOTAL HEMOGLOBIN: 11.5 g/dL — AB (ref 12.0–16.0)

## 2018-04-11 LAB — MAGNESIUM
MAGNESIUM: 2.4 mg/dL (ref 1.7–2.4)
Magnesium: 2.2 mg/dL (ref 1.7–2.4)

## 2018-04-11 MED ORDER — FUROSEMIDE 10 MG/ML IJ SOLN
20.0000 mg | Freq: Two times a day (BID) | INTRAMUSCULAR | Status: DC
  Administered 2018-04-11 – 2018-04-12 (×2): 20 mg via INTRAVENOUS
  Filled 2018-04-11 (×2): qty 2

## 2018-04-11 MED ORDER — ENOXAPARIN SODIUM 40 MG/0.4ML ~~LOC~~ SOLN: 40.0000 mg | Freq: Every day | SUBCUTANEOUS | Status: DC

## 2018-04-11 MED ORDER — SERTRALINE HCL 100 MG PO TABS
100.0000 mg | ORAL_TABLET | Freq: Every day | ORAL | Status: DC
  Administered 2018-04-11 – 2018-04-15 (×5): 100 mg via ORAL
  Filled 2018-04-11 (×5): qty 1

## 2018-04-11 MED ORDER — KETOROLAC TROMETHAMINE 15 MG/ML IJ SOLN
15.0000 mg | Freq: Four times a day (QID) | INTRAMUSCULAR | Status: AC
  Administered 2018-04-11 – 2018-04-12 (×5): 15 mg via INTRAVENOUS
  Filled 2018-04-11 (×5): qty 1

## 2018-04-11 MED ORDER — MILRINONE LACTATE IN DEXTROSE 20-5 MG/100ML-% IV SOLN
0.1250 ug/kg/min | INTRAVENOUS | Status: DC
  Administered 2018-04-11 – 2018-04-12 (×2): 0.125 ug/kg/min via INTRAVENOUS
  Filled 2018-04-11: qty 100

## 2018-04-11 MED ORDER — BUSPIRONE HCL 10 MG PO TABS
10.0000 mg | ORAL_TABLET | Freq: Every day | ORAL | Status: DC
  Administered 2018-04-11 – 2018-04-15 (×5): 10 mg via ORAL
  Filled 2018-04-11 (×5): qty 1

## 2018-04-11 MED ORDER — ALPRAZOLAM 0.5 MG PO TABS
0.5000 mg | ORAL_TABLET | Freq: Every evening | ORAL | Status: DC | PRN
  Administered 2018-04-12 – 2018-04-13 (×2): 0.5 mg via ORAL
  Filled 2018-04-11 (×2): qty 1

## 2018-04-11 MED ORDER — INSULIN ASPART 100 UNIT/ML ~~LOC~~ SOLN
0.0000 [IU] | SUBCUTANEOUS | Status: DC
  Administered 2018-04-11 (×2): 2 [IU] via SUBCUTANEOUS

## 2018-04-11 NOTE — Progress Notes (Addendum)
T[facility] DAILY ICU PROGRESS NOTE                   [address]            [place] [address]          [phone]   1 Day Post-Op Procedure(s) (LRB): RESECTION AND GRAFTING OF ASCENDING AORTIC ROOT ANUERYSM (SUPRACORONARY) (N/A) TRANSESOPHAGEAL ECHOCARDIOGRAM (TEE) (N/A) Aortic Valve Replacement (Avr), USING INSPIRIS 29MM (N/A)  Total Length of Stay:  LOS: 1 day   Subjective: Feels okay this morning. He is having some incisional pain but appears to be well controlled with the pain medication perscribed  Objective: Vital signs in last 24 hours: Temp:  [95.5 F (35.3 C)-99 F (37.2 C)] 98.1 F (36.7 C) (03/04 0700) Pulse Rate:  [58-90] 81 (03/04 0700) Cardiac Rhythm: Normal sinus rhythm (03/04 0400) Resp:  [10-34] 25 (03/04 0700) BP: (95-122)/(47-96) 96/68 (03/04 0700) SpO2:  [92 %-100 %] 97 % (03/04 0700) Arterial Line BP: (78-131)/(41-63) 112/52 (03/04 0700) FiO2 (%):  [40 %-50 %] 40 % (03/03 1854) Weight:  [105 kg] 105 kg (03/04 0500)  Filed Weights   04/10/18 0612 04/11/18 0500  Weight: 98.4 kg 105 kg    Weight change: 6.57 kg   Hemodynamic parameters for last 24 hours: PAP: (25-53)/(11-22) 35/16 CO:  [4.2 L/min-7.7 L/min] 7.7 L/min CI:  [1.8 L/min/m2-3.4 L/min/m2] 3.4 L/min/m2  Intake/Output from previous day: 03/03 0701 - 03/04 0700 In: 9687.3 [P.O.:1200; I.V.:5531.6; Blood:825; IV Piggyback:2130.7] Out: 1448 [Urine:3315; Emesis/NG output:50; Blood:700; Chest Tube:706]  Intake/Output this shift: No intake/output data recorded.  Current Meds: Scheduled Meds: . acetaminophen  1,000 mg Oral Q6H  . aspirin EC  325 mg Oral Daily  . bisacodyl  10 mg Oral Daily   Or  . bisacodyl  10 mg Rectal Daily  . busPIRone  10 mg Oral QHS  . Chlorhexidine Gluconate Cloth  6 each Topical Daily  . docusate sodium  200 mg Oral Daily  . insulin aspart  0-24 Units Subcutaneous Q4H  . mouth rinse  15 mL Mouth Rinse BID  . mupirocin ointment  1 application  Nasal BID  . [START ON 04/12/2018] pantoprazole  40 mg Oral Daily  . sertraline  100 mg Oral QHS  . sodium chloride flush  3 mL Intravenous Q12H   Continuous Infusions: . sodium chloride    . albumin human Stopped (04/10/18 2031)  . cefUROXime (ZINACEF)  IV 1.5 g (04/11/18 0401)  . lactated ringers    . lactated ringers 20 mL/hr at 04/11/18 0400  . milrinone 0.25 mcg/kg/min (04/10/18 2157)  . phenylephrine (NEO-SYNEPHRINE) Adult infusion Stopped (04/11/18 0640)   PRN Meds:.albumin human, ALPRAZolam, metoprolol tartrate, morphine injection, ondansetron (ZOFRAN) IV, oxyCODONE, sodium chloride flush, traMADol  General appearance: alert, cooperative and no distress Heart: regular rate and rhythm, S1, S2 normal, no murmur, click, rub or gallop Lungs: clear to auscultation bilaterally Abdomen: soft, non-tender; diminished bowel sounds; no masses,  no organomegaly Extremities: diffuse upper and lower edema Wound: clean and dry dressed with a sterile dressing  Lab Results: CBC: Recent Labs    04/10/18 1841  04/10/18 2041 04/11/18 0356  WBC 18.0*  --   --  14.6*  HGB 13.2   < > 11.9* 11.9*  HCT 39.6   < > 35.0* 35.7*  PLT 98*  --   --  78*   < > = values in this interval not displayed.   BMET:  Recent Labs  04/10/18 1332  04/10/18 1841  04/10/18 2041 04/11/18 0356  NA 141   < >  --    < > 141 138  K 4.3   < >  --    < > 4.8 4.6  CL  --   --   --   --   --  111  CO2  --   --   --   --   --  22  GLUCOSE 109*  --   --   --   --  120*  BUN  --   --   --   --   --  13  CREATININE  --   --  0.93  --   --  0.75  CALCIUM  --   --   --   --   --  8.6*   < > = values in this interval not displayed.    CMET: Lab Results  Component Value Date   WBC 14.6 (H) 04/11/2018   HGB 11.9 (L) 04/11/2018   HCT 35.7 (L) 04/11/2018   PLT 78 (L) 04/11/2018   GLUCOSE 120 (H) 04/11/2018   CHOL 140 03/28/2018   TRIG 75 03/28/2018   HDL 21 (L) 03/28/2018   LDLCALC 104 (H) 03/28/2018   ALT  35 04/04/2018   AST 37 04/04/2018   NA 138 04/11/2018   K 4.6 04/11/2018   CL 111 04/11/2018   CREATININE 0.75 04/11/2018   BUN 13 04/11/2018   CO2 22 04/11/2018   TSH 1.643 03/28/2018   INR 1.4 (H) 04/10/2018   HGBA1C 5.7 (H) 04/04/2018      PT/INR:  Recent Labs    04/10/18 1334  LABPROT 16.6*  INR 1.4*   Radiology: Dg Chest Port 1 View  Result Date: 04/10/2018 CLINICAL DATA:  S/P RESECTION AND GRAFTING OF ASCENDING AORTIC ROOT ANUERYSM (SUPRACORONARY and Aortic Valve Replacement (Avr), USING INSPIRIS 29MM EXAM: PORTABLE CHEST 1 VIEW COMPARISON:  03/29/2018 FINDINGS: Status post cardiac surgery since the prior exam. Cardiac silhouette is normal in size. No mediastinal widening. There is opacity in the right lung base consistent with atelectasis. Remainder of the lungs is clear. No convincing pleural effusion. No pneumothorax. Endotracheal tube tip projects 4 cm above the carinal. Nasal/orogastric tube passes below the diaphragm into the stomach. Right internal jugular Swan-Ganz catheter tip projects in the main pulmonary artery. There is a mediastinal tube and a right inferior me thorax chest tube. For IMPRESSION: 1. Status post cardiac surgery and aortic valve replacement. 2. No evidence of an operative complication. 3. Mild right lung base atelectasis. 4. Support apparatus is well positioned. Electronically Signed   By: Lajean Manes M.D.   On: 04/10/2018 13:57     Assessment/Plan: S/P Procedure(s) (LRB): RESECTION AND GRAFTING OF ASCENDING AORTIC ROOT ANUERYSM (SUPRACORONARY) (N/A) TRANSESOPHAGEAL ECHOCARDIOGRAM (TEE) (N/A) Aortic Valve Replacement (Avr), USING INSPIRIS 29MM (N/A)  1. CV-NSR in the 80s, BP on the low-end at times. Remains on Milrinone. Weaned off Neo.  2. Pulm-Remains on 4L [place] with excellent oxygen saturation. Continue to encourage incentive spirometer for pulmonary function 3. Renal-creatinine 0.75, electrolytes okay. Expected fluid overload 4. H and H stable  11.9/35.7, expected acute blood loss anemia 5. Endo-blood glucose well controlled. Recent CBGs 122, 119, 122 6. Continue post-op antiboitics 7. Working on pain control today  Plan: See progression orders. Keep chest tubes today-1000cc/since surgery collectively. Continue to wean oxygen as tolerated. Can discontinue a-line if he remains off Neo. Discontinue swan-ganz catheter.  OOB to chair.    Elgie Collard 04/11/2018 7:31 AM   I have seen and examined the patient and agree with the assessment and plan as outlined.  Doing well POD1  Rexene Alberts, MD 04/11/2018 8:45 AM

## 2018-04-11 NOTE — Progress Notes (Signed)
      [address]       [place] [address]             [phone]      POD # 1 AVR, repair ascending aneurysm  BP (!) 91/55   Pulse 85   Temp 98.3 F (36.8 C) (Oral)   Resp 19   Ht 6\' 3"  (1.905 m)   Wt 105 kg   SpO2 94%   BMI 28.93 kg/m    Intake/Output Summary (Last 24 hours) at 04/11/2018 1757 Last data filed at 04/11/2018 1523 Gross per 24 hour  Intake 4234.96 ml  Output 1780 ml  Net 2454.96 ml   CBG well controlled  PM labs pending  Remo Lipps C. Roxan Hockey, MD [facility] [phone]

## 2018-04-12 ENCOUNTER — Inpatient Hospital Stay: Payer: Self-pay

## 2018-04-12 ENCOUNTER — Inpatient Hospital Stay (HOSPITAL_COMMUNITY): Payer: Managed Care, Other (non HMO)

## 2018-04-12 LAB — COOXEMETRY PANEL
Carboxyhemoglobin: 1.6 % — ABNORMAL HIGH (ref 0.5–1.5)
Carboxyhemoglobin: 1.4 % (ref 0.5–1.5)
METHEMOGLOBIN: 1.7 % — AB (ref 0.0–1.5)
Methemoglobin: 1.6 % — ABNORMAL HIGH (ref 0.0–1.5)
O2 Saturation: 60.8 %
O2 Saturation: 44.9 %
Total hemoglobin: 10.8 g/dL — ABNORMAL LOW (ref 12.0–16.0)
Total hemoglobin: 11.3 g/dL — ABNORMAL LOW (ref 12.0–16.0)

## 2018-04-12 LAB — BASIC METABOLIC PANEL
Anion gap: 7 (ref 5–15)
BUN: 18 mg/dL (ref 6–20)
CHLORIDE: 103 mmol/L (ref 98–111)
CO2: 23 mmol/L (ref 22–32)
Calcium: 8.7 mg/dL — ABNORMAL LOW (ref 8.9–10.3)
Creatinine, Ser: 0.99 mg/dL (ref 0.61–1.24)
GFR calc Af Amer: >60 mL/min (ref >60)
GFR calc non Af Amer: >60 mL/min (ref >60)
Glucose, Bld: 121 mg/dL — ABNORMAL HIGH (ref 70–99)
POTASSIUM: 4.2 mmol/L (ref 3.5–5.1)
Sodium: 133 mmol/L — ABNORMAL LOW (ref 135–145)

## 2018-04-12 LAB — CBC
HEMATOCRIT: 32.2 % — AB (ref 39.0–52.0)
Hemoglobin: 10.2 g/dL — ABNORMAL LOW (ref 13.0–17.0)
MCH: 28.4 pg (ref 26.0–34.0)
MCHC: 31.7 g/dL (ref 30.0–36.0)
MCV: 89.7 fL (ref 80.0–100.0)
NRBC: 0 % (ref 0.0–0.2)
Platelets: 56 10*3/uL — ABNORMAL LOW (ref 150–400)
RBC: 3.59 MIL/uL — ABNORMAL LOW (ref 4.22–5.81)
RDW: 13.4 % (ref 11.5–15.5)
WBC: 9.8 10*3/uL (ref 4.0–10.5)

## 2018-04-12 LAB — GLUCOSE, CAPILLARY
GLUCOSE-CAPILLARY: 110 mg/dL — AB (ref 70–99)
Glucose-Capillary: 117 mg/dL — ABNORMAL HIGH (ref 70–99)

## 2018-04-12 MED ORDER — MILRINONE LACTATE IN DEXTROSE 20-5 MG/100ML-% IV SOLN
0.1250 ug/kg/min | INTRAVENOUS | Status: DC
  Administered 2018-04-12: 0.25 ug/kg/min via INTRAVENOUS
  Administered 2018-04-13: 0.125 ug/kg/min via INTRAVENOUS
  Administered 2018-04-13: 0.25 ug/kg/min via INTRAVENOUS
  Filled 2018-04-12 (×2): qty 100

## 2018-04-12 MED ORDER — CHLORHEXIDINE GLUCONATE CLOTH 2 % EX PADS
6.0000 | MEDICATED_PAD | Freq: Every day | CUTANEOUS | Status: DC
  Administered 2018-04-13 – 2018-04-14 (×2): 6 via TOPICAL

## 2018-04-12 MED ORDER — TORSEMIDE 20 MG PO TABS
40.0000 mg | ORAL_TABLET | Freq: Every day | ORAL | Status: DC
  Administered 2018-04-13: 40 mg via ORAL
  Filled 2018-04-12: qty 2

## 2018-04-12 MED ORDER — FUROSEMIDE 10 MG/ML IJ SOLN
40.0000 mg | Freq: Two times a day (BID) | INTRAMUSCULAR | Status: AC
  Administered 2018-04-12 (×2): 40 mg via INTRAVENOUS
  Filled 2018-04-12 (×2): qty 4

## 2018-04-12 MED ORDER — ROSUVASTATIN CALCIUM 5 MG PO TABS
10.0000 mg | ORAL_TABLET | Freq: Every day | ORAL | Status: DC
  Administered 2018-04-12 – 2018-04-15 (×4): 10 mg via ORAL
  Filled 2018-04-12 (×4): qty 2

## 2018-04-12 MED ORDER — POTASSIUM CHLORIDE CRYS ER 20 MEQ PO TBCR
20.0000 meq | EXTENDED_RELEASE_TABLET | Freq: Two times a day (BID) | ORAL | Status: DC
  Administered 2018-04-12 (×2): 20 meq via ORAL
  Filled 2018-04-12 (×2): qty 1

## 2018-04-12 MED ORDER — SPIRONOLACTONE 12.5 MG HALF TABLET
12.5000 mg | ORAL_TABLET | Freq: Every day | ORAL | Status: DC
  Administered 2018-04-12 – 2018-04-16 (×5): 12.5 mg via ORAL
  Filled 2018-04-12 (×5): qty 1

## 2018-04-12 MED ORDER — SODIUM CHLORIDE 0.9% FLUSH: 10.0000 mL | INTRAVENOUS | Status: DC | PRN

## 2018-04-12 MED ORDER — SODIUM CHLORIDE 0.9% FLUSH
10.0000 mL | Freq: Two times a day (BID) | INTRAVENOUS | Status: DC
  Administered 2018-04-12 – 2018-04-15 (×5): 10 mL
  Administered 2018-04-15: 20 mL

## 2018-04-12 MED ORDER — MOVING RIGHT ALONG BOOK
Freq: Once | Status: AC
  Administered 2018-04-12: 11:00:00

## 2018-04-12 MED FILL — Dexmedetomidine HCl in NaCl 0.9% IV Soln 400 MCG/100ML: INTRAVENOUS | Qty: 100 | Status: AC

## 2018-04-12 MED FILL — Magnesium Sulfate Inj 50%: INTRAMUSCULAR | Qty: 10 | Status: AC

## 2018-04-12 MED FILL — Heparin Sodium (Porcine) Inj 1000 Unit/ML: INTRAMUSCULAR | Qty: 30 | Status: AC

## 2018-04-12 MED FILL — Mannitol IV Soln 20%: INTRAVENOUS | Qty: 1000 | Status: AC

## 2018-04-12 MED FILL — Potassium Chloride Inj 2 mEq/ML: INTRAVENOUS | Qty: 40 | Status: AC

## 2018-04-12 MED FILL — Sodium Chloride IV Soln 0.9%: INTRAVENOUS | Qty: 2000 | Status: AC

## 2018-04-12 MED FILL — Lidocaine HCl(Cardiac) IV PF Soln Pref Syr 100 MG/5ML (2%): INTRAVENOUS | Qty: 25 | Status: AC

## 2018-04-12 MED FILL — Electrolyte-R (PH 7.4) Solution: INTRAVENOUS | Qty: 3000 | Status: AC

## 2018-04-12 MED FILL — Calcium Chloride Inj 10%: INTRAVENOUS | Qty: 10 | Status: AC

## 2018-04-12 MED FILL — Heparin Sodium (Porcine) Inj 1000 Unit/ML: INTRAMUSCULAR | Qty: 10 | Status: AC

## 2018-04-12 MED FILL — Sodium Bicarbonate IV Soln 8.4%: INTRAVENOUS | Qty: 100 | Status: AC

## 2018-04-12 NOTE — Progress Notes (Signed)
Patient ID: Tyler Berger, male   DOB: [birth year], 59 y.o.   MRN: [id number] TCTS Evening Rounds:  Hemodynamically stable in sinus rhythm.   Milrinone stopped this am after Co-ox was 61 but this afternoon it dropped to 45. He was started back on Milrinone 0.25 via PICC line.  Ambulated well  sats 98%   Urine output good.

## 2018-04-12 NOTE — Progress Notes (Signed)
Peripherally Inserted Central Catheter/Midline Placement  The IV Nurse has discussed with the patient and/or persons authorized to consent for the patient, the purpose of this procedure and the potential benefits and risks involved with this procedure.  The benefits include less needle sticks, lab draws from the catheter, and the patient may be discharged home with the catheter. Risks include, but not limited to, infection, bleeding, blood clot (thrombus formation), and puncture of an artery; nerve damage and irregular heartbeat and possibility to perform a PICC exchange if needed/ordered by physician.  Alternatives to this procedure were also discussed.  Bard Power PICC patient education guide, fact sheet on infection prevention and patient information card has been provided to patient /or left at bedside.    PICC/Midline Placement Documentation  PICC Double Lumen 04/12/18 PICC Right Brachial 41 cm 0 cm (Active)  Indication for Insertion or Continuance of Line Prolonged intravenous therapies 04/12/2018  5:00 PM  Exposed Catheter (cm) 0 cm 04/12/2018  5:00 PM  Site Assessment Clean;Dry;Intact 04/12/2018  5:00 PM  Lumen #1 Status Flushed;Blood return noted;Saline locked 04/12/2018  5:00 PM  Lumen #2 Status Flushed;Blood return noted;Saline locked 04/12/2018  5:00 PM  Dressing Type Transparent 04/12/2018  5:00 PM  Dressing Status Clean;Dry;Intact 04/12/2018  5:00 PM  Dressing Change Due 04/19/18 04/12/2018  5:00 PM       [address] 04/12/2018, 6:00 PM

## 2018-04-12 NOTE — Progress Notes (Addendum)
TCTS DAILY ICU PROGRESS NOTE                   [address]            [place] [address]          [phone]   2 Days Post-Op Procedure(s) (LRB): RESECTION AND GRAFTING OF ASCENDING AORTIC ROOT ANUERYSM (SUPRACORONARY) (N/A) TRANSESOPHAGEAL ECHOCARDIOGRAM (TEE) (N/A) Aortic Valve Replacement (Avr), USING INSPIRIS 29MM (N/A)  Total Length of Stay:  LOS: 2 days   Subjective: Feels okay this morning. Vomited earlier but after zofran feels a little better.   Objective: Vital signs in last 24 hours: Temp:  [97.9 F (36.6 C)-98.5 F (36.9 C)] 98.3 F (36.8 C) (03/05 0805) Pulse Rate:  [27-91] 53 (03/05 0700) Cardiac Rhythm: Normal sinus rhythm (03/05 0000) Resp:  [11-30] 21 (03/05 0700) BP: (84-139)/(53-90) 101/74 (03/05 0700) SpO2:  [88 %-100 %] 98 % (03/05 0700) Arterial Line BP: (89-123)/(46-56) 89/46 (03/04 1400) Weight:  [341 kg] 107 kg (03/05 0500)  Filed Weights   04/10/18 0612 04/11/18 0500 04/12/18 0500  Weight: 98.4 kg 105 kg 107 kg    Weight change: 2 kg      Intake/Output from previous day: 03/04 0701 - 03/05 0700 In: 1060.6 [P.O.:240; I.V.:620.6; IV Piggyback:200] Out: 1110 [Urine:675; Chest Tube:435]  Intake/Output this shift: Total I/O In: -  Out: 230 [Urine:200; Chest Tube:30]  Current Meds: Scheduled Meds: . acetaminophen  1,000 mg Oral Q6H  . aspirin EC  325 mg Oral Daily  . bisacodyl  10 mg Oral Daily   Or  . bisacodyl  10 mg Rectal Daily  . busPIRone  10 mg Oral QHS  . Chlorhexidine Gluconate Cloth  6 each Topical Daily  . docusate sodium  200 mg Oral Daily  . furosemide  20 mg Intravenous Q12H  . insulin aspart  0-24 Units Subcutaneous Q4H  . ketorolac  15 mg Intravenous Q6H  . mouth rinse  15 mL Mouth Rinse BID  . mupirocin ointment  1 application Nasal BID  . pantoprazole  40 mg Oral Daily  . sertraline  100 mg Oral QHS  . sodium chloride flush  3 mL Intravenous Q12H   Continuous Infusions: . sodium chloride      . lactated ringers    . lactated ringers 20 mL/hr at 04/12/18 0700  . milrinone 0.125 mcg/kg/min (04/11/18 0950)   PRN Meds:.ALPRAZolam, metoprolol tartrate, morphine injection, ondansetron (ZOFRAN) IV, oxyCODONE, sodium chloride flush, traMADol  General appearance: alert, cooperative and no distress Heart: regular rate and rhythm, S1, S2 normal, no murmur, click, rub or gallop Lungs: clear to auscultation bilaterally Abdomen: soft, non-tender; bowel sounds normal; no masses,  no organomegaly Extremities: lower extremity edema Wound: clean and dry dressed with sterile dressing  Lab Results: CBC: Recent Labs    04/11/18 1952 04/12/18 0356  WBC 11.2* 9.8  HGB 10.5* 10.2*  HCT 32.1* 32.2*  PLT 60* 56*   BMET:  Recent Labs    04/11/18 1952 04/12/18 0356  NA 133* 133*  K 4.0 4.2  CL 104 103  CO2 22 23  GLUCOSE 124* 121*  BUN 19 18  CREATININE 1.08 0.99  CALCIUM 8.7* 8.7*    CMET: Lab Results  Component Value Date   WBC 9.8 04/12/2018   HGB 10.2 (L) 04/12/2018   HCT 32.2 (L) 04/12/2018   PLT 56 (L) 04/12/2018   GLUCOSE 121 (H) 04/12/2018   CHOL 140 03/28/2018   TRIG  75 03/28/2018   HDL 21 (L) 03/28/2018   LDLCALC 104 (H) 03/28/2018   ALT 35 04/04/2018   AST 37 04/04/2018   NA 133 (L) 04/12/2018   K 4.2 04/12/2018   CL 103 04/12/2018   CREATININE 0.99 04/12/2018   BUN 18 04/12/2018   CO2 23 04/12/2018   TSH 1.643 03/28/2018   INR 1.4 (H) 04/10/2018   HGBA1C 5.7 (H) 04/04/2018      PT/INR:  Recent Labs    04/10/18 1334  LABPROT 16.6*  INR 1.4*   Radiology: No results found.   Assessment/Plan: S/P Procedure(s) (LRB): RESECTION AND GRAFTING OF ASCENDING AORTIC ROOT ANUERYSM (SUPRACORONARY) (N/A) TRANSESOPHAGEAL ECHOCARDIOGRAM (TEE) (N/A) Aortic Valve Replacement (Avr), USING INSPIRIS 29MM (N/A)   1. CV-NSR in the 80s, BP on the low-end at times. Remains on Milrinone.coox 60.8.  2. Pulm-Remains on 3L [place] with excellent oxygen saturation.  Continue to encourage incentive spirometer for pulmonary function. CXR stable this morning. 200cc/24 hours out of the chest tubes collectively.  3. Renal-creatinine 0.99, electrolytes okay. Expected fluid overload. On lasix 20mg  IV for gentle diuresis.  4. H and H stable 10.2/32.2, expected acute blood loss anemia 5. Endo-blood glucose well controlled. Recent CBGs 101, 110, and 117 6. Working on pain control-improved.  7. GI-nausea with vomiting this morning. Feels a little better now after zofran. Bowel sounds are diminished. Keep on liquid diet for now.   Plan: making good progress. Can likely remove mediastinal chest tubes this morning. Weaning milrinone. OOB to chair. Ambulated around the unit yesterday and wants to walk more today.    Elgie Collard 04/12/2018 8:10 AM    I have seen and examined the patient and agree with the assessment and plan as outlined.  Looks good POD2.  Will wean milrinone off and recheck co-ox this afternoon.  D/C tubes.  Diuresis.  Mobilize.  Possible transfer 4E later today or tomorrow.  Rexene Alberts, MD 04/12/2018 10:21 AM

## 2018-04-12 NOTE — Progress Notes (Signed)
Progress Note  Patient Name: Tyler Berger Date of Encounter: 04/12/2018  Primary Cardiologist:   No primary care provider on file.   Subjective   Slight nausea but actually doing well.  Usual incisional pain.   Inpatient Medications    Scheduled Meds: . acetaminophen  1,000 mg Oral Q6H  . aspirin EC  325 mg Oral Daily  . bisacodyl  10 mg Oral Daily   Or  . bisacodyl  10 mg Rectal Daily  . busPIRone  10 mg Oral QHS  . Chlorhexidine Gluconate Cloth  6 each Topical Daily  . docusate sodium  200 mg Oral Daily  . furosemide  40 mg Intravenous BID  . ketorolac  15 mg Intravenous Q6H  . mouth rinse  15 mL Mouth Rinse BID  . mupirocin ointment  1 application Nasal BID  . pantoprazole  40 mg Oral Daily  . potassium chloride  20 mEq Oral BID  . rosuvastatin  10 mg Oral q1800  . sertraline  100 mg Oral QHS  . sodium chloride flush  3 mL Intravenous Q12H  . spironolactone  12.5 mg Oral Daily  . [START ON 04/13/2018] torsemide  40 mg Oral Daily   Continuous Infusions: . sodium chloride    . lactated ringers     PRN Meds: ALPRAZolam, metoprolol tartrate, morphine injection, ondansetron (ZOFRAN) IV, oxyCODONE, sodium chloride flush, traMADol   Vital Signs    Vitals:   04/12/18 0800 04/12/18 0805 04/12/18 0918 04/12/18 1000  BP: (!) 101/55  91/65 104/67  Pulse: 80  81 75  Resp: (!) 23  19 19   Temp:  98.3 F (36.8 C)    TempSrc:  Oral    SpO2: 96%  100% 100%  Weight:      Height:        Intake/Output Summary (Last 24 hours) at 04/12/2018 1131 Last data filed at 04/12/2018 1030 Gross per 24 hour  Intake 1112.27 ml  Output 1370 ml  Net -257.73 ml   Filed Weights   04/10/18 0612 04/11/18 0500 04/12/18 0500  Weight: 98.4 kg 105 kg 107 kg    Telemetry    NSR, PVCs - Personally Reviewed  ECG    NA - Personally Reviewed  Physical Exam   GEN: No acute distress.   Neck: No  JVD Cardiac: RRR, distant heart sounds Respiratory: Clear  to auscultation bilaterally. GI:  Soft, nontender, non-distended  MS:  Mild diffuse edema; No deformity. Neuro:  Nonfocal  Psych: Normal affect   Labs    Chemistry Recent Labs  Lab 04/11/18 0356 04/11/18 1952 04/12/18 0356  NA 138 133* 133*  K 4.6 4.0 4.2  CL 111 104 103  CO2 22 22 23   GLUCOSE 120* 124* 121*  BUN 13 19 18   CREATININE 0.75 1.08 0.99  CALCIUM 8.6* 8.7* 8.7*  GFRNONAA >60 >60 >60  GFRAA >60 >60 >60  ANIONGAP 5 7 7      Hematology Recent Labs  Lab 04/11/18 0356 04/11/18 1952 04/12/18 0356  WBC 14.6* 11.2* 9.8  RBC 4.05* 3.58* 3.59*  HGB 11.9* 10.5* 10.2*  HCT 35.7* 32.1* 32.2*  MCV 88.1 89.7 89.7  MCH 29.4 29.3 28.4  MCHC 33.3 32.7 31.7  RDW 13.5 13.5 13.4  PLT 78* 60* 56*    Cardiac EnzymesNo results for input(s): TROPONINI in the last 168 hours. No results for input(s): TROPIPOC in the last 168 hours.   BNPNo results for input(s): BNP, PROBNP in the last 168 hours.  DDimer No results for input(s): DDIMER in the last 168 hours.   Radiology    Dg Chest Port 1 View  Result Date: 04/12/2018 CLINICAL DATA:  Atelectasis, chest tube EXAM: PORTABLE CHEST 1 VIEW COMPARISON:  Chest radiograph from one day prior. FINDINGS: Intact sternotomy wires. Aortic valve prosthesis is in place. Right chest tube and mediastinal drain are in place. Interval removal of Swan-Ganz catheter with right internal jugular central venous sheath in place. Stable cardiomediastinal silhouette with mild cardiomegaly. No pneumothorax. No pleural effusion. Stable mild-to-moderate bibasilar atelectasis. No pulmonary edema. IMPRESSION: 1. No pneumothorax. 2. Stable mild-to-moderate bibasilar atelectasis. 3. Stable cardiomegaly without pulmonary edema. Electronically Signed   By: Ilona Sorrel M.D.   On: 04/12/2018 09:37   Dg Chest Port 1 View  Result Date: 04/11/2018 CLINICAL DATA:  Status post mitral valve surgery EXAM: PORTABLE CHEST 1 VIEW COMPARISON:  04/10/2018 FINDINGS: Endotracheal tube and nasogastric catheter  have been removed in the interval. Mediastinal drain, right thoracostomy catheter and Swan-Ganz catheter are again noted and stable. No pneumothorax is seen. Lungs are well aerated with mild bibasilar atelectasis. No pneumothorax is seen. IMPRESSION: Mild bibasilar atelectasis. Electronically Signed   By: Inez Catalina M.D.   On: 04/11/2018 08:25   Dg Chest Port 1 View  Result Date: 04/10/2018 CLINICAL DATA:  S/P RESECTION AND GRAFTING OF ASCENDING AORTIC ROOT ANUERYSM (SUPRACORONARY and Aortic Valve Replacement (Avr), USING INSPIRIS 29MM EXAM: PORTABLE CHEST 1 VIEW COMPARISON:  03/29/2018 FINDINGS: Status post cardiac surgery since the prior exam. Cardiac silhouette is normal in size. No mediastinal widening. There is opacity in the right lung base consistent with atelectasis. Remainder of the lungs is clear. No convincing pleural effusion. No pneumothorax. Endotracheal tube tip projects 4 cm above the carinal. Nasal/orogastric tube passes below the diaphragm into the stomach. Right internal jugular Swan-Ganz catheter tip projects in the main pulmonary artery. There is a mediastinal tube and a right inferior me thorax chest tube. For IMPRESSION: 1. Status post cardiac surgery and aortic valve replacement. 2. No evidence of an operative complication. 3. Mild right lung base atelectasis. 4. Support apparatus is well positioned. Electronically Signed   By: Lajean Manes M.D.   On: 04/10/2018 13:57    Cardiac Studies   TEE PRE-OP FINDINGS  Left Ventricle: The left ventricle has severely reduced systolic function, with an ejection fraction of 20-25% (Simpson's 22%). The cavity size was severely dilated. Left ventricular diffuse hypokinesis. Right Ventricle: The right ventricle has normal systolic function. The cavity was normal. There is no increase in right ventricular wall thickness. Left Atrium: Left atrial size was dilated. The left atrial appendage is well visualized and there is no evidence of thrombus  present. Left atrial appendage velocity is reduced at less than 40 cm/s. Right Atrium: Right atrial size was normal in size. Interatrial Septum: No PFO or atrial level shunt detected by color flow Doppler. Pericardium: There is no evidence of pericardial effusion. Mitral Valve: The mitral valve is normal in structure. No thickening of the mitral valve leaflet. No calcification of the mitral valve leaflet. There is no mitral stenosis. Mitral valve regurgitation is trivial by color flow Doppler. The MR jet is  centrally-directed. There is no evidence of mitral valve vegetation. Tricuspid Valve: The tricuspid valve was normal in structure. Tricuspid valve regurgitation was not visualized by color flow Doppler. No TV vegetation was visualized. Aortic Valve: The aortic valve is bicuspid. There is Severely thickening of the aortic valve, There is Severe calcifcation of  the aortic valve and, with severely decreased cusp excursion. Aortic valve regurgitation is mild to moderate by color flow  Doppler. The jet is posteriorly-directed. There is severe stenosis of the aortic valve, with peak gradient 79 mmHg, mean gradient 45 mmHg, and a calculated valve area of 1.8 cm (Plainimetry 0.9 cm2). There is fusion of the Left and right coronary cusps.  There is Moderate to severe aortic annular calcification noted. There is no evidence of a vegetation on the aortic valve. Pulmonic Valve: The pulmonic valve was normal in structure. Pulmonic valve regurgitation is trivial around the PA catheter, by color flow Doppler. Aorta: The descending aorta is normal in size and structure. There is dilatation of the aortic root (4.78 cm), of the ascending aorta (3.4 cm) and at the level of the sinuses of Valsalva. Pulmonary Artery: The pulmonary artery is mildly dilated. Venous: The inferior vena cava is normal in size with less than 50% respiratory variability.  Patient Profile     58 y.o. male with a history of non-ischemic  cardiomyopathy, chronic systolic CHF, SVT, tobacco abuse, non-obstructive CAD, hypertension, anxiety and aortic stenosis.  Now status post AVR/aortic root graft.   Assessment & Plan    AVR/AORTIC ROOT GRAFT:    DILATED CARDIOMYOPATHY:    The etiology of this was likely valve related.  However there is a  significant history of EtOH use and previous cardiomyopathy that responded to meds and abstaining from EtOH.  Will encourage complete EtOH abstinence.  Weaning milrinone.   CoOx 60.8  SVT:  No evidence of recurrence of this during this hospitalization.   We will follow as needed and for discharge planning.  For questions or updates, please contact [facility] Please consult [email or web address] for contact info under Cardiology/STEMI.   Signed, Minus Breeding, MD  04/12/2018, 11:31 AM

## 2018-04-12 NOTE — Discharge Instructions (Signed)

## 2018-04-12 NOTE — Progress Notes (Signed)
Gave report to [employer]. Caitlin assuming care from now.  Pt currently in bed, appears asleep. Vital signs are stable. Family at bedside. Call light within reach.

## 2018-04-12 NOTE — Discharge Summary (Signed)
[address]       [place] [address]             [phone]      Physician Discharge Summary  Patient ID: BRAIN HONEYCUTT MRN: [id number] DOB/AGE: Nov 01, 1960 58 y.o.  Admit date: 04/10/2018 Discharge date: 04/16/2018  Admission Diagnoses: Patient Active Problem List   Diagnosis Date Noted  . Non-ischemic cardiomyopathy ([facility])   . Chronic combined systolic and diastolic congestive heart failure ([facility])   . Ascending aortic aneurysm ([facility])   . Dilated aortic root ([facility])   . Acute on chronic systolic CHF (congestive heart failure) ([facility]) 03/28/2018  . Severe aortic stenosis   . SVT (supraventricular tachycardia) ([facility]) 02/16/2018  . Tobacco abuse 02/16/2018  . Bicuspid aortic valve 06/01/2017  . LBBB (left bundle branch block) 06/01/2017  . Varicose veins of both lower extremities 06/01/2016  . Medication management 08/25/2013  . Prediabetes 08/25/2013  . Hyperlipidemia   . Hypertension   . Anxiety   . Vitamin D deficiency     Discharge Diagnoses:  Principal Problem:   S/P aortic valve replacement with bioprosthetic valve + resection ascending thoracic aortic aneurysm Active Problems:   Hypertension   Bicuspid aortic valve   LBBB (left bundle branch block)   Severe aortic stenosis   Dilated aortic root (H[facility])   Non-ischemic cardiomyopathy (H[facility])   Chronic combined systolic and diastolic congestive heart failure ([facility])   S/P ascending aortic aneurysm repair   Ascending aortic aneurysm ([facility])   Discharged Condition: good  HPI:   Patient is a 58 year old male with known history of bicuspid aortic valve with aortic stenosis, chronic systolic congestive heart failure with 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  Patient's cardiac history dates back to 2004 when he was hospitalized with symptoms of congestive heart failure. 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 He was noted to have a bicuspid aortic valve with what was felt to be mild to moderate aortic stenosis at that time. He was seen in follow-up in 2008at which time reportedly left ventricular systolic function had recovered with ejection fraction estimated 60 to 65%. There was moderate aortic stenosis with mean transvalvular gradient reported 23 mmHg at that time. Stress perfusion study performed at that time was felt to be low risk. He was lost to medical follow-up until recently.  Over the past 4 to 6 months the patient has developed progressive symptoms of exertional shortness of breath and fatigue. In early January he was evaluated in the emergency department with an episode of supraventricular tachycardia. He was treated and released and has subsequently been followed carefully by Dr. Percival Spanish. Transthoracic echocardiogram performed March 02, 2018 revealed severe left ventricular systolic dysfunction with ejection fraction estimated 25 to 30%. There was bicuspid aortic valve with severe low gradient, low ejection fraction aortic stenosis. Peak velocity across the aortic valve was measured 3.6 m/s corresponding to mean transvalvular gradient estimated 33 mmHg. The DVI was 0.2 with aortic valve area calculated 0.68cm. He was scheduled for elective diagnostic cardiac catheterization which was performed March 28, 2018. The patient was found to have mild nonobstructive coronary artery disease. The aortic valve was not crossed. There was  moderate to severe pulmonary hypertension with PA pressures measured 70/32 with pulmonary capillary wedge pressure of 30, mixed venous oxygen saturation 61%,  and cardiac index only 1.72 L/min/m. The patient was hospitalized for acute exacerbation of chronic systolic congestive heart failure. He improved from a symptomatic standpoint with medical therapy for heart failure. He was seen in consultation by Dr. Angelena Form from the multidisciplinary heart valve team. CT angiography was performed and the patient has been referred for elective surgical consultation.  Patient is married and lives locally in [place] with his partner and husband. He has 2 adult children, 1 of whom accompanies him for his office consultation today. 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 Although he does not exercise on a regular basis he has remained reasonably active physically and functionally independent until recently. He reports that his only functional limitation is that of progressive exertional shortness of breath and fatigue, all of which seem to begin last fall. Symptoms slowly progressed and have been associated with a persistent cough. He had 1 syncopal episode last fall for which he did not seek medical attention. He has never had any chest pain or chest tightness either with activity or at rest. He has had some lower extremity edema. He has not experienced PND or orthopnea. Symptoms have improved since his recent hospitalization. Since hospital discharge weight has been stable. He still gets short of breath with activity but not quite as bad as it had been previously. He states that he quit smoking approximately 2 weeks ago. He drinks alcohol on a daily basis.  He was originally seen in consultation on 04/02/2018 and since then he underwent dental extraction last week.     Hospital Course:   On 04/10/2018 Mr. Ganas underwent and aortic valve replacement and repair of a sending thoracic aortic aneurysm with Dr. Roxy Manns.  He was transferred to the surgical ICU in stable condition.  He was extubated timely manner.  Postop day 1 he was  extubated and having some incisional pain.  He remained hemodynamically stable and normal sinus rhythm.  He remained on low-dose milrinone.  He was weaned off of Neo-Synephrine.  He was on 4 L nasal cannula with excellent oxygenation.  His renal function remained stable.  He did have some expected fluid overload.  We initiated a diuretic regimen.  We kept his chest tubes in place due to output.  We discontinued his a line and Swan-Ganz catheter.  We encouraged ambulation around the unit.  Postop day 2 he did have one episode of emesis.  After Zofran he felt a little bit better.  We continued him on a clear liquid diet.  He was ambulating around the unit.  We discontinued his mediastinal chest tubes.  He remained on Lasix for expected fluid overload.  We continue to wean his milrinone. He was stable at this time to transfer to the cardiac telemetry unit for continued care. On the floor he continued to progress. He was tolerating room air, his incision was healing well, he was ambulating with limited assistance, and he is ready for discharge home.   Consults: None  Significant Diagnostic Studies:   CLINICAL DATA:  Status post mitral valve surgery  EXAM: PORTABLE CHEST 1 VIEW  COMPARISON:  04/10/2018  FINDINGS: Endotracheal tube and nasogastric catheter have been removed in the interval. Mediastinal drain, right thoracostomy catheter and Swan-Ganz catheter are again noted and stable. No pneumothorax is seen. Lungs are well aerated with mild bibasilar atelectasis.  No pneumothorax is seen.  IMPRESSION: Mild bibasilar atelectasis.   Electronically Signed   By: Inez Catalina M.D.   On: 04/11/2018 08:25  Treatments:   CARDIOTHORACIC SURGERY OPERATIVE NOTE  Date of Procedure:                04/10/2018  Preoperative Diagnosis:        Bicuspid Aortic Valve  Severe Aortic Stenosis   Ascending Aortic Aneurysm  Postoperative Diagnosis:    Same   Procedure:        Aortic Valve  Replacement             Edwards Inspiris Resilia stented bovine pericardial tissue valve (size 29 mm, ref # [id number], serial # [id number])               Repair Ascending Thoracic Aortic Aneurysm             Supra-coronary straight graft using Hemashield Platinum woven double velour vascular graft (size 26 mm, ref # [id number], serial # [id number])  Surgeon:        Valentina Gu. Roxy Manns, MD  Assistant:       Nicholes Rough, PA-C  Anesthesia:    Midge Minium, MD  Operative Findings: ? Bicuspid aortic valve [place] type I) ? Severe aortic stenosis ? Severe left ventricular systolic dysfunction ? Severe pulmonary hypertension ? Acute exacerbation of chronic combined systolic and diastolic CHF  Discharge Exam: Blood pressure 109/63, pulse 63, temperature 98.2 F (36.8 C), temperature source Oral, resp. rate 16, height 6\' 3"  (1.905 m), weight 101.6 kg, SpO2 97 %.   General appearance: alert, cooperative and no distress Heart: regular rate and rhythm, S1, S2 normal, no murmur, click, rub or gallop Lungs: clear to auscultation bilaterally Abdomen: soft, non-tender; bowel sounds normal; no masses,  no organomegaly Extremities: 1-2+ lower extremity edema Wound: clean and dry  Disposition: Discharge disposition: 01-Home or Self Care        Allergies as of 04/16/2018   No Known Allergies     Medication List    STOP taking these medications   aspirin 81 MG chewable tablet Replaced by:  aspirin 325 MG EC tablet   digoxin 0.125 MG tablet Commonly known as:  LANOXIN   HYDROcodone-acetaminophen 5-325 MG tablet Commonly known as:  Norco   metoprolol succinate 50 MG 24 hr tablet Commonly known as:  TOPROL-XL     TAKE these medications   acetaminophen 500 MG tablet Commonly known as:  TYLENOL Take 1,000 mg by mouth every 6 (six) hours as needed for mild pain or headache.   ALPRAZolam 0.5 MG tablet Commonly known as:  XANAX Take 1 tablet (0.5 mg total) by mouth at  bedtime as needed for anxiety. What changed:    how much to take  when to take this   amiodarone 200 MG tablet Commonly known as:  PACERONE Take 1 tablet (200 mg total) by mouth 2 (two) times daily after a meal.   aspirin 325 MG EC tablet Take 1 tablet (325 mg total) by mouth daily. Replaces:  aspirin 81 MG chewable tablet   busPIRone 10 MG tablet Commonly known as:  BUSPAR Take 1 tablet (10 mg total) by mouth 3 (three) times daily. What changed:  when to take this   fluticasone furoate-vilanterol 100-25 MCG/INH Aepb Commonly known as:  Breo Ellipta Inhale 1 puff into the lungs daily. Rinse mouth with water after each use   metoprolol tartrate 25 MG tablet Commonly known as:  LOPRESSOR Take 1 tablet (25 mg total) by mouth 2 (two) times daily.   oxyCODONE 5 MG immediate release tablet Commonly known as:  Oxy IR/ROXICODONE Take 1 tablet (5 mg total) by mouth every 6 (six) hours as needed for severe pain.   potassium chloride SA 20 MEQ tablet Commonly known as:  K-DUR,KLOR-CON Take two tabs (40mg ) twice a day for 5 days then take two tabs (40mg ) once a day. What changed:    how much to take  how to take this  when to take this  additional instructions   rosuvastatin 10 MG tablet Commonly known as:  CRESTOR Take 1 tablet (10 mg total) by mouth daily at 6 PM.   sacubitril-valsartan 24-26 MG Commonly known as:  ENTRESTO Take 1 tablet by mouth 2 (two) times daily.   sertraline 100 MG tablet Commonly known as:  ZOLOFT Take 1 tablet (100 mg total) by mouth daily. What changed:  when to take this   spironolactone 25 MG tablet Commonly known as:  ALDACTONE Take 0.5 tablets (12.5 mg total) by mouth daily.   torsemide 20 MG tablet Commonly known as:  DEMADEX Take two tabs (40mg )  twice a day for 5 days then take two tabs (40mg ) once a day. What changed:    how much to take  how to take this  when to take this  additional instructions   Vitamin D3 125 MCG  (5000 UT) Tabs Take 20,000 Units by mouth daily.      Follow-up Information    Unk Pinto, MD. Call in 1 day(s).   Specialty:  Internal Medicine Contact information: [address] [place] [address] [phone]        [facility] [place] Follow up.   Specialty:  Cardiothoracic Surgery Why:  Your routine follow-up appointment is on 05/07/2018 at 2:00pm Please arrive at 1:30pm for a chest xray located at [facility] which is on the first floor of our building.  Contact information: [place], [place]       Minus Breeding, MD Follow up.   Specialty:  Cardiology Why:  Please call the office if no one has contacted you in 2 days to set up an appointment.  Contact information: [address] [place] [address] [phone]          The patient has been discharged on:   1.Beta Blocker:  Yes [ yes  ]                              No   [   ]                              If No, reason:  2.Ace Inhibitor/ARB: Yes [   ]                                     No  [ no   ]                                     If No, reason: titration of BB  3.Statin:   Yes [ yes  ]  No  [   ]                  If No, reason:  4.Ecasa:  Yes  [ yes  ]                  No   [   ]                  If No, reason:    Signed: Elgie Collard 04/16/2018, 9:01 AM

## 2018-04-13 ENCOUNTER — Encounter: Payer: Self-pay | Admitting: Thoracic Surgery (Cardiothoracic Vascular Surgery)

## 2018-04-13 ENCOUNTER — Inpatient Hospital Stay (HOSPITAL_COMMUNITY): Payer: Managed Care, Other (non HMO)

## 2018-04-13 LAB — CBC
HEMATOCRIT: 29.8 % — AB (ref 39.0–52.0)
Hemoglobin: 9.7 g/dL — ABNORMAL LOW (ref 13.0–17.0)
MCH: 29 pg (ref 26.0–34.0)
MCHC: 32.6 g/dL (ref 30.0–36.0)
MCV: 89 fL (ref 80.0–100.0)
Platelets: 68 10*3/uL — ABNORMAL LOW (ref 150–400)
RBC: 3.35 MIL/uL — ABNORMAL LOW (ref 4.22–5.81)
RDW: 13.3 % (ref 11.5–15.5)
WBC: 8.2 10*3/uL (ref 4.0–10.5)
nRBC: 0 % (ref 0.0–0.2)

## 2018-04-13 LAB — POCT I-STAT 4, (NA,K, GLUC, HGB,HCT)
Glucose, Bld: 127 mg/dL — ABNORMAL HIGH (ref 70–99)
HCT: 27 % — ABNORMAL LOW (ref 39.0–52.0)
HEMOGLOBIN: 9.2 g/dL — AB (ref 13.0–17.0)
Potassium: 4 mmol/L (ref 3.5–5.1)
Sodium: 135 mmol/L (ref 135–145)

## 2018-04-13 LAB — COOXEMETRY PANEL
Carboxyhemoglobin: 1.8 % — ABNORMAL HIGH (ref 0.5–1.5)
Methemoglobin: 1.7 % — ABNORMAL HIGH (ref 0.0–1.5)
O2 Saturation: 56.8 %
Total hemoglobin: 10.1 g/dL — ABNORMAL LOW (ref 12.0–16.0)

## 2018-04-13 LAB — BASIC METABOLIC PANEL
Anion gap: 7 (ref 5–15)
BUN: 15 mg/dL (ref 6–20)
CHLORIDE: 103 mmol/L (ref 98–111)
CO2: 23 mmol/L (ref 22–32)
Calcium: 8.3 mg/dL — ABNORMAL LOW (ref 8.9–10.3)
Creatinine, Ser: 0.76 mg/dL (ref 0.61–1.24)
GFR calc non Af Amer: >60 mL/min (ref >60)
Glucose, Bld: 133 mg/dL — ABNORMAL HIGH (ref 70–99)
Potassium: 3.8 mmol/L (ref 3.5–5.1)
Sodium: 133 mmol/L — ABNORMAL LOW (ref 135–145)

## 2018-04-13 LAB — MAGNESIUM: Magnesium: 1.8 mg/dL (ref 1.7–2.4)

## 2018-04-13 MED ORDER — AMIODARONE LOAD VIA INFUSION
150.0000 mg | Freq: Once | INTRAVENOUS | Status: AC
  Administered 2018-04-13: 150 mg via INTRAVENOUS
  Filled 2018-04-13: qty 83.34

## 2018-04-13 MED ORDER — FUROSEMIDE 10 MG/ML IJ SOLN
40.0000 mg | Freq: Two times a day (BID) | INTRAMUSCULAR | Status: AC
  Administered 2018-04-13 (×2): 40 mg via INTRAVENOUS
  Filled 2018-04-13 (×2): qty 4

## 2018-04-13 MED ORDER — AMIODARONE HCL IN DEXTROSE 360-4.14 MG/200ML-% IV SOLN
60.0000 mg/h | INTRAVENOUS | Status: AC
  Administered 2018-04-13 (×3): 60 mg/h via INTRAVENOUS
  Filled 2018-04-13 (×3): qty 200

## 2018-04-13 MED ORDER — AMIODARONE HCL IN DEXTROSE 360-4.14 MG/200ML-% IV SOLN
30.0000 mg/h | INTRAVENOUS | Status: AC
  Administered 2018-04-13 – 2018-04-14 (×3): 30 mg/h via INTRAVENOUS
  Filled 2018-04-13 (×4): qty 200

## 2018-04-13 MED ORDER — METOPROLOL TARTRATE 12.5 MG HALF TABLET
12.5000 mg | ORAL_TABLET | Freq: Two times a day (BID) | ORAL | Status: DC
  Administered 2018-04-13: 12.5 mg via ORAL
  Filled 2018-04-13 (×3): qty 1

## 2018-04-13 MED ORDER — METOPROLOL TARTRATE 25 MG PO TABS
25.0000 mg | ORAL_TABLET | Freq: Two times a day (BID) | ORAL | Status: DC
  Administered 2018-04-13 – 2018-04-16 (×6): 25 mg via ORAL
  Filled 2018-04-13 (×6): qty 1

## 2018-04-13 MED ORDER — POTASSIUM CHLORIDE 10 MEQ/50ML IV SOLN
10.0000 meq | INTRAVENOUS | Status: AC
  Administered 2018-04-13 (×2): 10 meq via INTRAVENOUS
  Filled 2018-04-13 (×2): qty 50

## 2018-04-13 MED ORDER — MAGNESIUM SULFATE 2 GM/50ML IV SOLN
2.0000 g | Freq: Once | INTRAVENOUS | Status: AC
  Administered 2018-04-13: 2 g via INTRAVENOUS
  Filled 2018-04-13: qty 50

## 2018-04-13 MED ORDER — POTASSIUM CHLORIDE CRYS ER 20 MEQ PO TBCR
40.0000 meq | EXTENDED_RELEASE_TABLET | Freq: Two times a day (BID) | ORAL | Status: DC
  Administered 2018-04-13 – 2018-04-16 (×6): 40 meq via ORAL
  Filled 2018-04-13 (×6): qty 2

## 2018-04-13 NOTE — Progress Notes (Addendum)
[address]ores[address]       [place] [address]             [phone]        CARDIOTHORACIC SURGERY PROGRESS NOTE   R3 Days Post-Op Procedure(s) (LRB): RESECTION AND GRAFTING OF ASCENDING AORTIC ROOT ANUERYSM (SUPRACORONARY) (N/A) TRANSESOPHAGEAL ECHOCARDIOGRAM (TEE) (N/A) Aortic Valve Replacement (Avr), USING INSPIRIS 29MM (N/A)  Subjective: Looks good and feels well.  Denies chest discomfort or SOB.  Frequent PAC's and PVC's w/ bursts of WCT overnight associated w/ palpitations but no other symptoms.  Objective: Vital signs: BP Readings from Last 1 Encounters:  04/13/18 106/67   Pulse Readings from Last 1 Encounters:  04/13/18 76   Resp Readings from Last 1 Encounters:  04/13/18 (!) 21   Temp Readings from Last 1 Encounters:  04/13/18 98 F (36.7 C) (Oral)    Hemodynamics:   Mixed venous co-ox 57%   Physical Exam:  Rhythm:   sinus  Breath sounds: clear  Heart sounds:  RRR  Incisions:  Dressing dry, intact  Abdomen:  Soft, non-distended, non-tender  Extremities:  Warm, well-perfused    Intake/Output from previous day: 03/05 0701 - 03/06 0700 In: 744.7 [P.O.:480; I.V.:263.5; IV Piggyback:1.1] Out: 1290 [Urine:1220; Chest Tube:70] Intake/Output this shift: No intake/output data recorded.  Lab Results:  CBC: Recent Labs    04/12/18 0356 04/13/18 0400 04/13/18 0410  WBC 9.8 8.2  --   HGB 10.2* 9.7* 9.2*  HCT 32.2* 29.8* 27.0*  PLT 56* 68*  --     BMET:  Recent Labs    04/12/18 0356 04/13/18 0400 04/13/18 0410  NA 133* 133* 135  K 4.2 3.8 4.0  CL 103 103  --   CO2 23 23  --   GLUCOSE 121* 133* 127*  BUN 18 15  --   CREATININE 0.99 0.76  --   CALCIUM 8.7* 8.3*  --      PT/INR:   Recent Labs    04/10/18 1334  LABPROT 16.6*  INR 1.4*    CBG (last 3)  Recent Labs    04/11/18 2313 04/12/18 0353 04/12/18 0800  GLUCAP 101* 110* 117*    ABG    Component Value Date/Time   PHART 7.322 (L) 04/10/2018 2041   PCO2ART 40.6 04/10/2018 2041   PO2ART 79.0 (L) 04/10/2018 2041   HCO3 21.0 04/10/2018 2041   TCO2 22 04/10/2018 2041   ACIDBASEDEF 5.0 (H) 04/10/2018 2041   O2SAT 56.8 04/13/2018 0420    CXR: pending  Assessment/Plan: S/P Procedure(s) (LRB): RESECTION AND GRAFTING OF ASCENDING AORTIC ROOT ANUERYSM (SUPRACORONARY) (N/A) TRANSESOPHAGEAL ECHOCARDIOGRAM (TEE) (N/A) Aortic Valve Replacement (Avr), USING INSPIRIS 29MM (N/A)  Overall doing well POD3 Episodes WCT overnight, currently maintaining NSR w/ stable BP, co-ox 57% on milrinone 0.25 Likely post-op Afib +/- SVT with adherent conduction given baseline LBBB and long h/o recurrent palpitations and SVT Acute on chronic systolic CHF with expected post-op volume excess, weight down 2 kg but still 7 kg > preop and he was somewhat volume overloaded at the time of admission Non-ischemic cardiomyopathy w/ moderate-severe global LV dysfunction Expected post op acute blood loss anemia, stable Expected post op atelectasis, mild Post op thrombocytopenia, platelet count up slightly 68k   Check 12 lead EKG  Continue amiodarone  Restart metoprolol at reduced dose and titrate up  Decrease milrinone 0.125  Supplement potassium and magnesium  Mobilize  Diuresis  Watch platelet count   Rexene Alberts, MD 04/13/2018  7:52 AM

## 2018-04-13 NOTE — Progress Notes (Signed)
[facility] BRIEF SICU PROGRESS NOTE  3 Days Post-Op  S/P Procedure(s) (LRB): RESECTION AND GRAFTING OF ASCENDING AORTIC ROOT ANUERYSM (SUPRACORONARY) (N/A) TRANSESOPHAGEAL ECHOCARDIOGRAM (TEE) (N/A) Aortic Valve Replacement (Avr), USING INSPIRIS 29MM (N/A)   Stable day Still having frequent ectopy but no more runs WCT  Plan: Continue current plan.  Will increase beta blocker  Rexene Alberts, MD 04/13/2018 6:37 PM

## 2018-04-13 NOTE — Progress Notes (Signed)
Pt continues to be in SVT/VT HR 90-130s.  Roxy Manns MD paged.  Amio bolus ordered and given. Pt alert and oriented and asymptomatic.

## 2018-04-13 NOTE — Progress Notes (Signed)
Progress Note  Patient Name: Tyler Berger Date of Encounter: 04/13/2018  Primary Cardiologist:   No primary care provider on file.   Subjective   He feels well.  Denies pain or SOB.    Inpatient Medications    Scheduled Meds: . acetaminophen  1,000 mg Oral Q6H  . aspirin EC  325 mg Oral Daily  . bisacodyl  10 mg Oral Daily   Or  . bisacodyl  10 mg Rectal Daily  . busPIRone  10 mg Oral QHS  . Chlorhexidine Gluconate Cloth  6 each Topical Daily  . docusate sodium  200 mg Oral Daily  . furosemide  40 mg Intravenous BID  . mouth rinse  15 mL Mouth Rinse BID  . metoprolol tartrate  12.5 mg Oral BID  . mupirocin ointment  1 application Nasal BID  . pantoprazole  40 mg Oral Daily  . potassium chloride  40 mEq Oral BID WC  . rosuvastatin  10 mg Oral q1800  . sertraline  100 mg Oral QHS  . sodium chloride flush  10-40 mL Intracatheter Q12H  . sodium chloride flush  3 mL Intravenous Q12H  . spironolactone  12.5 mg Oral Daily  . torsemide  40 mg Oral Daily   Continuous Infusions: . sodium chloride    . amiodarone 60 mg/hr (04/13/18 1100)   Followed by  . amiodarone    . lactated ringers    . milrinone 0.125 mcg/kg/min (04/13/18 1100)   PRN Meds: ALPRAZolam, metoprolol tartrate, morphine injection, ondansetron (ZOFRAN) IV, oxyCODONE, sodium chloride flush, sodium chloride flush, traMADol   Vital Signs    Vitals:   04/13/18 0734 04/13/18 0800 04/13/18 0900 04/13/18 1000  BP:  124/70 127/72 125/82  Pulse:  70 69 78  Resp:  (!) 24 (!) 22 (!) 24  Temp: 98 F (36.7 C)     TempSrc: Oral     SpO2:  100% 100% 100%  Weight:      Height:        Intake/Output Summary (Last 24 hours) at 04/13/2018 1109 Last data filed at 04/13/2018 1100 Gross per 24 hour  Intake 1350.95 ml  Output 900 ml  Net 450.95 ml   Filed Weights   04/11/18 0500 04/12/18 0500 04/13/18 0600  Weight: 105 kg 107 kg 104.9 kg    Telemetry    NSR runs of non sustained but frequent wide complex  tachycardia- Personally Reviewed  ECG    NSR, LBBB, first degree AV block.  04/13/2018 - Personally Reviewed  Physical Exam   GEN: No  acute distress.   Neck: No  JVD Cardiac: RRR with ectopy, no murmurs, rubs, or gallops.  Respiratory: Clear   to auscultation bilaterally. GI: Soft, nontender, non-distended, normal bowel sounds  MS:  Mild diffuse edema; No deformity. Neuro:   Nonfocal  Psych: Oriented and appropriate   Labs    Chemistry Recent Labs  Lab 04/11/18 1952 04/12/18 0356 04/13/18 0400 04/13/18 0410  NA 133* 133* 133* 135  K 4.0 4.2 3.8 4.0  CL 104 103 103  --   CO2 22 23 23   --   GLUCOSE 124* 121* 133* 127*  BUN 19 18 15   --   CREATININE 1.08 0.99 0.76  --   CALCIUM 8.7* 8.7* 8.3*  --   GFRNONAA >60 >60 >60  --   GFRAA >60 >60 >60  --   ANIONGAP 7 7 7   --      Hematology Recent Labs  Lab 04/11/18 1952 04/12/18 0356 04/13/18 0400 04/13/18 0410  WBC 11.2* 9.8 8.2  --   RBC 3.58* 3.59* 3.35*  --   HGB 10.5* 10.2* 9.7* 9.2*  HCT 32.1* 32.2* 29.8* 27.0*  MCV 89.7 89.7 89.0  --   MCH 29.3 28.4 29.0  --   MCHC 32.7 31.7 32.6  --   RDW 13.5 13.4 13.3  --   PLT 60* 56* 68*  --     Cardiac EnzymesNo results for input(s): TROPONINI in the last 168 hours. No results for input(s): TROPIPOC in the last 168 hours.   BNPNo results for input(s): BNP, PROBNP in the last 168 hours.   DDimer No results for input(s): DDIMER in the last 168 hours.   Radiology    Dg Chest Port 1 View  Result Date: 04/13/2018 CLINICAL DATA:  Short of breath EXAM: PORTABLE CHEST 1 VIEW COMPARISON:  04/12/2018 FINDINGS: Right upper extremity PICC placed. Tip is at the cavoatrial junction. Right jugular central venous catheter removed. Chest tube removed. Borderline cardiomegaly. Low lung volumes. Mild vascular congestion. Bibasilar atelectasis. Small less than 5% right apical pneumothorax. IMPRESSION: Right chest tube removed. Less than 5% right apical pneumothorax has ensued. Right  PICC tip at the cavoatrial junction. Bibasilar atelectasis and vascular congestion. Electronically Signed   By: Marybelle Killings M.D.   On: 04/13/2018 09:46   Dg Chest Port 1 View  Result Date: 04/12/2018 CLINICAL DATA:  Atelectasis, chest tube EXAM: PORTABLE CHEST 1 VIEW COMPARISON:  Chest radiograph from one day prior. FINDINGS: Intact sternotomy wires. Aortic valve prosthesis is in place. Right chest tube and mediastinal drain are in place. Interval removal of Swan-Ganz catheter with right internal jugular central venous sheath in place. Stable cardiomediastinal silhouette with mild cardiomegaly. No pneumothorax. No pleural effusion. Stable mild-to-moderate bibasilar atelectasis. No pulmonary edema. IMPRESSION: 1. No pneumothorax. 2. Stable mild-to-moderate bibasilar atelectasis. 3. Stable cardiomegaly without pulmonary edema. Electronically Signed   By: Ilona Sorrel M.D.   On: 04/12/2018 09:37   [place] Ekg Site Rite  Result Date: 04/12/2018 If Site Rite image not attached, placement could not be confirmed due to current cardiac rhythm.   Cardiac Studies   TEE PRE-OP FINDINGS  Left Ventricle: The left ventricle has severely reduced systolic function, with an ejection fraction of 20-25% (Simpson's 22%). The cavity size was severely dilated. Left ventricular diffuse hypokinesis. Right Ventricle: The right ventricle has normal systolic function. The cavity was normal. There is no increase in right ventricular wall thickness. Left Atrium: Left atrial size was dilated. The left atrial appendage is well visualized and there is no evidence of thrombus present. Left atrial appendage velocity is reduced at less than 40 cm/s. Right Atrium: Right atrial size was normal in size. Interatrial Septum: No PFO or atrial level shunt detected by color flow Doppler. Pericardium: There is no evidence of pericardial effusion. Mitral Valve: The mitral valve is normal in structure. No thickening of the mitral valve leaflet.  No calcification of the mitral valve leaflet. There is no mitral stenosis. Mitral valve regurgitation is trivial by color flow Doppler. The MR jet is  centrally-directed. There is no evidence of mitral valve vegetation. Tricuspid Valve: The tricuspid valve was normal in structure. Tricuspid valve regurgitation was not visualized by color flow Doppler. No TV vegetation was visualized. Aortic Valve: The aortic valve is bicuspid. There is Severely thickening of the aortic valve, There is Severe calcifcation of the aortic valve and, with severely decreased cusp excursion. Aortic valve  regurgitation is mild to moderate by color flow  Doppler. The jet is posteriorly-directed. There is severe stenosis of the aortic valve, with peak gradient 79 mmHg, mean gradient 45 mmHg, and a calculated valve area of 1.8 cm (Plainimetry 0.9 cm2). There is fusion of the Left and right coronary cusps.  There is Moderate to severe aortic annular calcification noted. There is no evidence of a vegetation on the aortic valve. Pulmonic Valve: The pulmonic valve was normal in structure. Pulmonic valve regurgitation is trivial around the PA catheter, by color flow Doppler. Aorta: The descending aorta is normal in size and structure. There is dilatation of the aortic root (4.78 cm), of the ascending aorta (3.4 cm) and at the level of the sinuses of Valsalva. Pulmonary Artery: The pulmonary artery is mildly dilated. Venous: The inferior vena cava is normal in size with less than 50% respiratory variability.  Patient Profile     58 y.o. male with a history of non-ischemic cardiomyopathy, chronic systolic CHF, SVT, tobacco abuse, non-obstructive CAD, hypertension, anxiety and aortic stenosis.  Now status post AVR/aortic root graft.   Assessment & Plan    AVR/AORTIC ROOT GRAFT:  Post op care per Dr. Florene Glen al.    DILATED CARDIOMYOPATHY:     Weaning milrinone slowly given fall in CoOx off of this yesterday.    CoOx 56.8.  On IV  diuresis x 2 doses and PO ordered with net negative yesterday.  On spironolactone.  We will start afterload reduction as BP allows.  Likely would tolerate low dose ARB tomorrow.     WIDE COMPLEX TACH:   Unable to distinguish between V tach or SVT with aberrant conduction.  Need 12 lead if he has long runs of this.  Agree with amiodarone IV and low dose beta blocker.  Rhythm is regular with no flutter waves so not suspecting flutter or fib.     For questions or updates, please contact [facility] Please consult [email or web address] for contact info under Cardiology/STEMI.   Signed, Minus Breeding, MD  04/13/2018, 11:09 AM

## 2018-04-13 NOTE — Progress Notes (Signed)
Patient experiencing frequent runs of v tach, symptomatic with shortness of breath and c/o chest discomfort. TCTS contacted. New orders obtained.

## 2018-04-14 LAB — CBC
HCT: 29.9 % — ABNORMAL LOW (ref 39.0–52.0)
Hemoglobin: 9.8 g/dL — ABNORMAL LOW (ref 13.0–17.0)
MCH: 28.8 pg (ref 26.0–34.0)
MCHC: 32.8 g/dL (ref 30.0–36.0)
MCV: 87.9 fL (ref 80.0–100.0)
Platelets: 84 10*3/uL — ABNORMAL LOW (ref 150–400)
RBC: 3.4 MIL/uL — ABNORMAL LOW (ref 4.22–5.81)
RDW: 13.2 % (ref 11.5–15.5)
WBC: 6.4 10*3/uL (ref 4.0–10.5)
nRBC: 0 % (ref 0.0–0.2)

## 2018-04-14 LAB — COOXEMETRY PANEL
Carboxyhemoglobin: 1.2 % (ref 0.5–1.5)
Carboxyhemoglobin: 1.2 % (ref 0.5–1.5)
METHEMOGLOBIN: 1.5 % (ref 0.0–1.5)
Methemoglobin: 1 % (ref 0.0–1.5)
O2 Saturation: 51.4 %
O2 Saturation: 44.5 %
TOTAL HEMOGLOBIN: 11.1 g/dL — AB (ref 12.0–16.0)
Total hemoglobin: 11.9 g/dL — ABNORMAL LOW (ref 12.0–16.0)

## 2018-04-14 LAB — MAGNESIUM: Magnesium: 1.9 mg/dL (ref 1.7–2.4)

## 2018-04-14 LAB — BASIC METABOLIC PANEL
Anion gap: 7 (ref 5–15)
BUN: 13 mg/dL (ref 6–20)
CO2: 24 mmol/L (ref 22–32)
CREATININE: 0.77 mg/dL (ref 0.61–1.24)
Calcium: 8.5 mg/dL — ABNORMAL LOW (ref 8.9–10.3)
Chloride: 104 mmol/L (ref 98–111)
GFR calc Af Amer: >60 mL/min (ref >60)
GFR calc non Af Amer: >60 mL/min (ref >60)
Glucose, Bld: 122 mg/dL — ABNORMAL HIGH (ref 70–99)
Potassium: 3.5 mmol/L (ref 3.5–5.1)
Sodium: 135 mmol/L (ref 135–145)

## 2018-04-14 MED ORDER — TORSEMIDE 20 MG PO TABS
40.0000 mg | ORAL_TABLET | Freq: Two times a day (BID) | ORAL | Status: DC
  Administered 2018-04-14 – 2018-04-16 (×4): 40 mg via ORAL
  Filled 2018-04-14 (×5): qty 2

## 2018-04-14 MED ORDER — MAGNESIUM SULFATE 4 GM/100ML IV SOLN
4.0000 g | Freq: Once | INTRAVENOUS | Status: AC
  Administered 2018-04-14: 4 g via INTRAVENOUS
  Filled 2018-04-14: qty 100

## 2018-04-14 MED ORDER — POTASSIUM CHLORIDE 10 MEQ/50ML IV SOLN
10.0000 meq | INTRAVENOUS | Status: AC
  Administered 2018-04-14 (×5): 10 meq via INTRAVENOUS
  Filled 2018-04-14 (×5): qty 50

## 2018-04-14 MED ORDER — SACUBITRIL-VALSARTAN 24-26 MG PO TABS
1.0000 | ORAL_TABLET | Freq: Two times a day (BID) | ORAL | Status: DC
  Administered 2018-04-14 – 2018-04-16 (×5): 1 via ORAL
  Filled 2018-04-14 (×6): qty 1

## 2018-04-14 MED ORDER — FE FUMARATE-B12-VIT C-FA-IFC PO CAPS
1.0000 | ORAL_CAPSULE | Freq: Three times a day (TID) | ORAL | Status: DC
  Administered 2018-04-14 – 2018-04-16 (×7): 1 via ORAL
  Filled 2018-04-14 (×7): qty 1

## 2018-04-14 MED ORDER — POTASSIUM CHLORIDE CRYS ER 20 MEQ PO TBCR
20.0000 meq | EXTENDED_RELEASE_TABLET | ORAL | Status: DC
  Administered 2018-04-14: 20 meq via ORAL
  Filled 2018-04-14: qty 1

## 2018-04-14 NOTE — Progress Notes (Signed)
Patient coox for 1700 this evening was 44.5; dropped from 51.4 this AM.  Dr. Roxy Manns notified.  Per Dr. Roxy Manns leave Milrinone off and reassess in the AM after he has been able to receive 2 doses of Entresto.  Bobette Mo

## 2018-04-14 NOTE — Progress Notes (Signed)
      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  Mild soreness in chest.  Ambulating very well.  No SOB.  Appetite improving and bowels working  Objective: Vital signs: BP Readings from Last 1 Encounters:  04/14/18 113/64   Pulse Readings from Last 1 Encounters:  04/14/18 (!) 45   Resp Readings from Last 1 Encounters:  04/14/18 (!) 22   Temp Readings from Last 1 Encounters:  04/14/18 98.3 F (36.8 C) (Oral)    Hemodynamics:   Mixed venous co-ox 51%  Physical Exam:  Rhythm:   Sinus w/ occasional PVC's  Breath sounds: clear  Heart sounds:  RRR  Incisions:  Clean and dry  Abdomen:  Soft, non-distended, non-tender  Extremities:  Warm, well-perfused    Intake/Output from previous day: 03/06 0701 - 03/07 0700 In: 1583.3 [P.O.:600; I.V.:843.7; IV Piggyback:139.6] Out: 2250 [Urine:2250] Intake/Output this shift: Total I/O In: 281.4 [P.O.:240; I.V.:41.4] Out: 550 [Urine:550]  Lab Results:  CBC: Recent Labs    04/13/18 0400 04/13/18 0410 04/14/18 0431  WBC 8.2  --  6.4  HGB 9.7* 9.2* 9.8*  HCT 29.8* 27.0* 29.9*  PLT 68*  --  84*    BMET:  Recent Labs    04/13/18 0400 04/13/18 0410 04/14/18 0431  NA 133* 135 135  K 3.8 4.0 3.5  CL 103  --  104  CO2 23  --  24  GLUCOSE 133* 127* 122*  BUN 15  --  13  CREATININE 0.76  --  0.77  CALCIUM 8.3*  --  8.5*     PT/INR:  No results for input(s): LABPROT, INR in the last 72 hours.  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) (N/A) TRANSESOPHAGEAL ECHOCARDIOGRAM (TEE) (N/A) Aortic Valve Replacement (Avr), USING INSPIRIS 29MM (N/A)  Overall doing well POD4 No further episodes SVT, maintaining NSR w/ stable BP, co-ox 51% on milrinone 0.125 Acute on chronic systolic CHF with expected post-op volume excess, weight stable yesterday and still 7 kg > preop  Non-ischemic cardiomyopathy w/ moderate-severe global LV dysfunction Expected post op acute blood loss anemia, stable Expected post op atelectasis, mild Post op thrombocytopenia, platelet count rising   Continue IV amiodarone  Continue metoprolol  Wean milrinone off and restart Entresto  Supplement potassium and magnesium  Mobilize  Diuresis - increase Demadex to BID, continue Spironolactone  Iron, folate   Rexene Alberts, MD 04/14/2018 9:20 AM

## 2018-04-14 NOTE — Progress Notes (Signed)
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  Reports having a good day NSR w/ stable BP on Entresto Mixed venous co-ox down slightly 51%  Plan: Will continue to monitor off milrinone, recheck co-ox tomorrow  Rexene Alberts, MD 04/14/2018 6:04 PM

## 2018-04-15 LAB — COOXEMETRY PANEL
Carboxyhemoglobin: 1.3 % (ref 0.5–1.5)
Methemoglobin: 1.7 % — ABNORMAL HIGH (ref 0.0–1.5)
O2 Saturation: 61.7 %
TOTAL HEMOGLOBIN: 12.6 g/dL (ref 12.0–16.0)

## 2018-04-15 LAB — BASIC METABOLIC PANEL
Anion gap: 6 (ref 5–15)
BUN: 11 mg/dL (ref 6–20)
CO2: 26 mmol/L (ref 22–32)
Calcium: 8.8 mg/dL — ABNORMAL LOW (ref 8.9–10.3)
Chloride: 105 mmol/L (ref 98–111)
Creatinine, Ser: 0.79 mg/dL (ref 0.61–1.24)
GFR calc Af Amer: >60 mL/min (ref >60)
GFR calc non Af Amer: >60 mL/min (ref >60)
Glucose, Bld: 112 mg/dL — ABNORMAL HIGH (ref 70–99)
Potassium: 3.9 mmol/L (ref 3.5–5.1)
Sodium: 137 mmol/L (ref 135–145)

## 2018-04-15 MED ORDER — AMIODARONE HCL 200 MG PO TABS
400.0000 mg | ORAL_TABLET | Freq: Two times a day (BID) | ORAL | Status: DC
  Administered 2018-04-15 – 2018-04-16 (×3): 400 mg via ORAL
  Filled 2018-04-15 (×3): qty 2

## 2018-04-15 NOTE — Progress Notes (Signed)
Pt ambulated x 470 feet around unit pt toleratted well

## 2018-04-15 NOTE — Progress Notes (Signed)
EPW pulled per MD order. Pt tolerated activity well. VSS, resting in bed comfortably. Bed rest until 1741. Will continue to monitor.

## 2018-04-15 NOTE — Progress Notes (Signed)
      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  No complaints.  No SOB  Objective: Vital signs: BP Readings from Last 1 Encounters:  04/15/18 120/67   Pulse Readings from Last 1 Encounters:  04/15/18 78   Resp Readings from Last 1 Encounters:  04/15/18 20   Temp Readings from Last 1 Encounters:  04/15/18 98 F (36.7 C) (Oral)    Hemodynamics:   Mixed venous co-ox 61.7%   Physical Exam:  Rhythm:   Sinus w/ occasional PVC  Breath sounds: clear  Heart sounds:  RRR  Incisions:  Clean and dry  Abdomen:  Soft, non-distended, non-tender  Extremities:  Warm, well-perfused    Intake/Output from previous day: 03/07 0701 - 03/08 0700 In: 1770.9 [P.O.:1080; I.V.:390.9; IV Piggyback:300] Out: 3000 [Urine:3000] Intake/Output this shift: Total I/O In: 289.9 [P.O.:240; I.V.:49.9] Out: 450 [Urine:450]  Lab Results:  CBC: Recent Labs    04/13/18 0400 04/13/18 0410 04/14/18 0431  WBC 8.2  --  6.4  HGB 9.7* 9.2* 9.8*  HCT 29.8* 27.0* 29.9*  PLT 68*  --  84*    BMET:  Recent Labs    04/14/18 0431 04/15/18 0455  NA 135 137  K 3.5 3.9  CL 104 105  CO2 24 26  GLUCOSE 122* 112*  BUN 13 11  CREATININE 0.77 0.79  CALCIUM 8.5* 8.8*     PT/INR:  No results for input(s): LABPROT, INR in the last 72 hours.  CBG (last 3)  No results for input(s): GLUCAP in the last 72 hours.  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) (N/A) TRANSESOPHAGEAL ECHOCARDIOGRAM (TEE) (N/A) Aortic Valve Replacement (Avr), USING INSPIRIS 29MM (N/A)  Overall doing well POD5 No further episodes SVT, maintaining NSR w/ stable BP, co-ox 61.7% off milrinone Acute on chronic systolic CHF with expected post-op volume excess,weight down 1.4 kg yesterday but still 5 kg > preop and volume-overloaded at presentation Non-ischemic cardiomyopathy w/ moderate-severe global LV dysfunction Expected post op acute blood loss anemia,stable Expected post op atelectasis,mild Post op thrombocytopenia,platelet count rising   Convert amiodarone to oral  Continue metoprolol  Continue Entresto  Mobilize  Diuresis - continue Demadex BID for now, continue Spironolactone  Iron, folate  Transfer 4E   Rexene Alberts, MD 04/15/2018 10:30 AM

## 2018-04-16 LAB — BASIC METABOLIC PANEL
Anion gap: 10 (ref 5–15)
BUN: 14 mg/dL (ref 6–20)
CO2: 27 mmol/L (ref 22–32)
Calcium: 9.1 mg/dL (ref 8.9–10.3)
Chloride: 101 mmol/L (ref 98–111)
Creatinine, Ser: 0.85 mg/dL (ref 0.61–1.24)
GFR calc Af Amer: >60 mL/min (ref >60)
GFR calc non Af Amer: >60 mL/min (ref >60)
Glucose, Bld: 130 mg/dL — ABNORMAL HIGH (ref 70–99)
Potassium: 3.9 mmol/L (ref 3.5–5.1)
Sodium: 138 mmol/L (ref 135–145)

## 2018-04-16 LAB — CBC
HCT: 33.7 % — ABNORMAL LOW (ref 39.0–52.0)
Hemoglobin: 11 g/dL — ABNORMAL LOW (ref 13.0–17.0)
MCH: 28.4 pg (ref 26.0–34.0)
MCHC: 32.6 g/dL (ref 30.0–36.0)
MCV: 86.9 fL (ref 80.0–100.0)
NRBC: 0 % (ref 0.0–0.2)
Platelets: 142 10*3/uL — ABNORMAL LOW (ref 150–400)
RBC: 3.88 MIL/uL — ABNORMAL LOW (ref 4.22–5.81)
RDW: 13.1 % (ref 11.5–15.5)
WBC: 8.1 10*3/uL (ref 4.0–10.5)

## 2018-04-16 MED ORDER — OXYCODONE HCL 5 MG PO TABS: 5.0000 mg | ORAL_TABLET | Freq: Four times a day (QID) | ORAL | 0 refills | Status: DC | PRN

## 2018-04-16 MED ORDER — POTASSIUM CHLORIDE CRYS ER 20 MEQ PO TBCR: EXTENDED_RELEASE_TABLET | ORAL | 1 refills | Status: DC

## 2018-04-16 MED ORDER — METOPROLOL TARTRATE 25 MG PO TABS: 25.0000 mg | ORAL_TABLET | Freq: Two times a day (BID) | ORAL | 1 refills | Status: DC

## 2018-04-16 MED ORDER — AMIODARONE HCL 200 MG PO TABS: 200.0000 mg | ORAL_TABLET | Freq: Two times a day (BID) | ORAL | 1 refills | Status: DC

## 2018-04-16 MED ORDER — ASPIRIN 325 MG PO TBEC: 325.0000 mg | DELAYED_RELEASE_TABLET | Freq: Every day | ORAL | 0 refills | Status: DC

## 2018-04-16 MED ORDER — TORSEMIDE 20 MG PO TABS: ORAL_TABLET | ORAL | 1 refills | Status: DC

## 2018-04-16 NOTE — Progress Notes (Signed)
      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 He did share that he can't sleep while in the hospital.   Objective: Vital signs in last 24 hours: Temp:  [97.7 F (36.5 C)-98.4 F (36.9 C)] 98.2 F (36.8 C) (03/09 0440) Pulse Rate:  [63-78] 63 (03/09 0440) Cardiac Rhythm: Normal sinus rhythm;Heart block (03/09 0700) Resp:  [12-28] 16 (03/09 0440) BP: (92-126)/(61-98) 109/63 (03/09 0440) SpO2:  [88 %-100 %] 97 % (03/09 0440) Weight:  [101.6 kg] 101.6 kg (03/09 0440)     Intake/Output from previous day: 03/08 0701 - 03/09 0700 In: 588.7 [P.O.:480; I.V.:108.7] Out: 950 [Urine:950] Intake/Output this shift: No intake/output data recorded.  General appearance: alert, cooperative and no distress Heart: regular rate and rhythm, S1, S2 normal, no murmur, click, rub or gallop Lungs: clear to auscultation bilaterally Abdomen: soft, non-tender; bowel sounds normal; no masses,  no organomegaly Extremities: 1-2+ lower extremity edema Wound: clean and dry  Lab Results: Recent Labs    04/14/18 0431 04/16/18 0315  WBC 6.4 8.1  HGB 9.8* 11.0*  HCT 29.9* 33.7*  PLT 84* 142*   BMET:  Recent Labs    04/15/18 0455 04/16/18 0315  NA 137 138  K 3.9 3.9  CL 105 101  CO2 26 27  GLUCOSE 112* 130*  BUN 11 14  CREATININE 0.79 0.85  CALCIUM 8.8* 9.1    PT/INR: No results for input(s): LABPROT, INR in the last 72 hours. ABG    Component Value Date/Time   PHART 7.322 (L) 04/10/2018 2041   HCO3 21.0 04/10/2018 2041   TCO2 22 04/10/2018 2041   ACIDBASEDEF 5.0 (H) 04/10/2018 2041   O2SAT 61.7 04/15/2018 0450   CBG (last 3)  No results for input(s): GLUCAP in the last 72  hours.  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 CV-continued frequent PVCs. Continue oral Amio. Continue Entresto and Metoprolol. Off Milrinone, most recent coox is 61.7 2. Pulm-remains on room air with excellent saturation. No CXR this morning 3. Renal-creatinine 0.85, electrolytes okay. Continue Spironolactone and Demadex. Weight continues to trend down.  4. H and H 11.0/33.7, expected acute blood loss anemia 5. Endo-blood glucose draws discontinued.  Plan: EPW out already. The patient is ambulating with limited assistance at this time. He has not been able to sleep well in the hospital. Continue diuretics for fluid overload-remains 3kg over baseline. Home soon.     LOS: 6 days    Elgie Collard 04/16/2018

## 2018-04-16 NOTE — Progress Notes (Signed)
Pt ambulated around the unit x 470 feet will continue monitor

## 2018-04-16 NOTE — Progress Notes (Signed)
CARDIAC REHAB PHASE I   Offered to walk with pt, pt states he has been ambulating this morning without difficulty. D/c education completed. Pt instructed on importance of showers and monitoring incisions daily. Encouraged continued IS use, walks and sternal precautions. Pt given in-the-tube sheet and heart healthy diet. Reviewed restrictions and exercise guidelines. Will refer to CRP II [place].  [phone] Rufina Falco, RN BSN 04/16/2018 9:47 AM

## 2018-04-16 NOTE — Progress Notes (Signed)
PICC removed. Site unremarkable. After care instructions discussed with pt. Teach back complete.

## 2018-04-16 NOTE — Progress Notes (Signed)
SW consulted for transportation needs. Patient states he will be picked up by his husband, there were no transportation concerns. Patient inquired if there were any resources to assist him with his living expenses due to being hospitalized and being out of work. CSW advised of Emergency Assistance with the [employer] and [place]. Patient inquired about financial assistance from the hospital, CSW explained if he needs assistance with hospital bill he should contact [facility] billing department. CSW informed the patient CSW will return with contact information for the above states resources. Patient was discharged and he left before CSW returned with contact information.  Thurmond Butts, MSW, [place] Social Worker [phone]

## 2018-04-16 NOTE — Progress Notes (Signed)
Order received to discharge patient.  Telemetry monitor removed and CCMD notified.  PICC line removed per IV team.  Discharge instructions, follow up, medications and instructions for their use discussed with patient.

## 2018-04-20 ENCOUNTER — Telehealth (HOSPITAL_COMMUNITY): Payer: Self-pay

## 2018-04-20 NOTE — Telephone Encounter (Signed)
Pt insurance is active and benefits verified through [employer] Co-pay 0, DED $4,000/$4,000 met, out of pocket $7,150/$7,150 met, co-insurance 0. no pre-authorization required, REF# [id number]  Will contact patient to see if he is interested in the Cardiac Rehab Program. If interested, patient will need to complete follow up appt. Once completed, patient will be contacted for scheduling upon review by the RN Navigator. Tedra Sen[place]. Support Rep II

## 2018-04-20 NOTE — Telephone Encounter (Signed)
Called patient to see if he is interested in the Cardiac Rehab Program. Patient expressed interest. Explained scheduling process and went over insurance, patient verbalized understanding. Will contact patient for scheduling once f/u has been completed. °Gloria W. Support Rep II °

## 2018-04-25 ENCOUNTER — Other Ambulatory Visit: Payer: Self-pay | Admitting: Adult Health

## 2018-04-25 DIAGNOSIS — F419 Anxiety disorder, unspecified: Secondary | ICD-10-CM

## 2018-04-25 MED ORDER — BUSPIRONE HCL 10 MG PO TABS: 10.0000 mg | ORAL_TABLET | Freq: Three times a day (TID) | ORAL | 2 refills | Status: DC

## 2018-04-30 ENCOUNTER — Encounter (HOSPITAL_COMMUNITY): Payer: Self-pay

## 2018-04-30 ENCOUNTER — Inpatient Hospital Stay (HOSPITAL_COMMUNITY): Payer: Self-pay

## 2018-04-30 NOTE — Progress Notes (Signed)
Heart Failure TeleHealth Note  Due to national recommendations of social distancing due to Tyler Berger, Audio/video telehealth visit is felt to be most appropriate for this patient at this time. I discussed the limitations, risks, security and privacy concerns of performing an evaluation and management service by telephone and the availability of in person appointments. I also discussed with the patient that there may be a patient responsible charge related to this service. The patient expressed understanding and agreed to proceed.  Date:  05/01/2018   ID:  Tyler Berger, Stage 1960/10/17, MRN [id number]  Location: Home  Provider location: [address] [place] Type of Visit: Established patient  PCP:  Tyler Pinto, MD  Cardiologist:  No primary care provider on file. Primary HF:   Chief Complaint: Heart Failure    History of Present Illness: Tyler Berger is a 58 y.o. male who presents via audio/video conferencing for a telehealth visit today.    Tyler Berger has a history of chronic systolic HF, NICM, LBBB, Aortic Stenosis s/p AVR, former smoker (quit 7 weeks ago), and moderate CAD.    This is a post hospital follow up. On 04/10/18 he underwent AVR by Dr Tyler Berger. Post op TEE showed EF was a improved to 35-40%. He was placed on amiodarone due to increased PVCs. He was discharged off digoxin and toprol xl. Discharge weight was 224 pounds.   Overall he is feeling ok. Today, he denies symptoms of palpitations, chest pain, shortness of breath, orthopnea, PND, lower extremity edema, claudication, dizziness, presyncope, syncope, or bleeding.  Appetite improving. Weight at home 220--->214 pounds. Now he says he has baseline weight of 217 pounds.  The patient is tolerating medications without difficulties and is otherwise without complaint today. He has not been drinking alcohol.   He denies symptoms of cough, fevers, chills, or new SOB worrisome for COVID Berger.    Past Medical History:    Diagnosis Date   Anxiety    Aortic root aneurysm (HCC)    Aortic stenosis    Ascending aortic aneurysm (HCC)    CAD (coronary artery disease)    Chronic combined systolic and diastolic congestive heart failure (HCC)    Dilated aortic root (HCC)    Hypertension    Non-ischemic cardiomyopathy (HCC)    Nonischemic cardiomyopathy (HCC)    S/P aortic valve replacement with bioprosthetic valve 04/10/2018   29 mm Edwards Inspiris Resilia stented bovine pericardial tissue valve   S/P ascending aortic aneurysm repair 04/10/2018   26 mm Hemashield Platinum supracoronary straight graft   SVT (supraventricular tachycardia) (HCC)    Viral myocarditis 2004   Vitamin D deficiency    Past Surgical History:  Procedure Laterality Date   AORTIC VALVE REPLACEMENT N/A 04/10/2018   Procedure: Aortic Valve Replacement (Avr), USING INSPIRIS 29MM;  Surgeon: Tyler Alberts, MD;  Location: [facility];  Service: Open Heart Surgery;  Laterality: N/A;   ASCENDING AORTIC ROOT REPLACEMENT N/A 04/10/2018   Procedure: RESECTION AND GRAFTING OF ASCENDING AORTIC ROOT ANUERYSM (SUPRACORONARY);  Surgeon: Tyler Alberts, MD;  Location: [facility];  Service: Open Heart Surgery;  Laterality: N/A;   HERNIA REPAIR Left 2010   MULTIPLE EXTRACTIONS WITH ALVEOLOPLASTY N/A 04/05/2018   Procedure: Extraction of tooth #'s 3, 12, 14,Berger, 23, 31 and 32 with alveoloplasty and gross debridement of remaining teeth;  Surgeon: Tyler Berger, DDS;  Location: [facility];  Service: Oral Surgery;  Laterality: N/A;   ORCHIECTOMY Left 1982  RIGHT HEART CATH AND CORONARY ANGIOGRAPHY N/A 2/Berger/2020   Procedure: RIGHT HEART CATH AND CORONARY ANGIOGRAPHY;  Surgeon: Tyler Dresser, MD;  Location: [facility];  Service: Cardiovascular;  Laterality: N/A;   TEE WITHOUT CARDIOVERSION N/A 04/10/2018   Procedure: TRANSESOPHAGEAL ECHOCARDIOGRAM (TEE);  Surgeon: Tyler Alberts, MD;  Location: [facility];  Service: Open Heart Surgery;  Laterality:  N/A;   VARICOSE VEIN SURGERY Left 05/1998     Current Outpatient Medications  Medication Sig Dispense Refill   acetaminophen (TYLENOL) 500 MG tablet Take 1,000 mg by mouth every 6 (six) hours as needed for mild pain or headache.     ALPRAZolam (XANAX) 0.5 MG tablet Take 1 tablet (0.5 mg total) by mouth at bedtime as needed for anxiety. (Patient taking differently: Take 0.25 mg by mouth at bedtime. ) 30 tablet 1   amiodarone (PACERONE) 200 MG tablet Take 1 tablet (200 mg total) by mouth 2 (two) times daily after a meal. 60 tablet 1   aspirin EC 325 MG EC tablet Take 1 tablet (325 mg total) by mouth daily. 30 tablet 0   busPIRone (BUSPAR) 10 MG tablet Take 1 tablet (10 mg total) by mouth 3 (three) times daily. 90 tablet 2   Cholecalciferol (VITAMIN D3) 125 MCG (5000 UT) TABS Take 20,000 Units by mouth daily.      metoprolol succinate (TOPROL-XL) 50 MG 24 hr tablet Take 25 mg by mouth 2 (two) times daily. Take with or immediately following a meal.      potassium chloride SA (K-DUR,KLOR-CON) 20 MEQ tablet Take two tabs (40mg ) twice a day for 5 days then take two tabs (40mg ) once a day. (Patient taking differently: Take 40 mEq by mouth daily. ) 80 tablet 1   rosuvastatin (CRESTOR) 10 MG tablet Take 1 tablet (10 mg total) by mouth daily at 6 PM. 30 tablet 6   sacubitril-valsartan (ENTRESTO) 24-26 MG Take 1 tablet by mouth 2 (two) times daily. 60 tablet 6   sertraline (ZOLOFT) 100 MG tablet Take 1 tablet (100 mg total) by mouth daily. (Patient taking differently: Take 100 mg by mouth at bedtime. ) 60 tablet 0   spironolactone (ALDACTONE) 25 MG tablet Take 0.5 tablets (12.5 mg total) by mouth daily. 30 tablet 6   fluticasone furoate-vilanterol (BREO ELLIPTA) 100-25 MCG/INH AEPB Inhale 1 puff into the lungs daily. Rinse mouth with water after each use (Patient not taking: Reported on 05/01/2018) 1 each 0   torsemide (DEMADEX) 20 MG tablet Take two tabs (40mg )  twice a day for 5 days then  take two tabs (40mg ) once a day. (Patient taking differently: Take 40 mg by mouth daily. ) 80 tablet 1   No current facility-administered medications for this encounter.     Allergies:   Patient has no known allergies.   Social History:  The patient  reports that he quit smoking about 4 weeks ago. His smoking use included cigarettes. He quit after 30.00 years of use. He has never used smokeless tobacco. He reports previous alcohol use. He reports that he does not use drugs.   Family History:  The patient's family history includes Alzheimer's disease in his mother; Cancer in his father and mother; Diabetes in his mother; Hypertension in his father, mother, and sister.   ROS:  Please see the history of present illness.   All other systems are personally reviewed and negative.   Exam:  (Video/Tele Health Call; Exam is subjective and or/visual.) General:  Well appearing.  No resp difficulty. HEENT: Normal Neck: no reported issues Cor:  Reports regular pulse. Reported pulse 74 Lungs: Normal respiratory effort with conversation.  Abdomen: Non-distended. Pt denies tenderness with self palpation.  Extremities: Pt denies edema. Neuro: Alert & oriented x 3.   Recent Labs: 2/Berger/2020: B Natriuretic Peptide 1,155.1; TSH 1.643 04/04/2018: ALT 35 04/14/2018: Magnesium 1.9 04/16/2018: BUN 14; Creatinine, Ser 0.85; Hemoglobin 11.0; Platelets 142; Potassium 3.9; Sodium 138  Personally reviewed   Wt Readings from Last 3 Encounters:  05/01/18 98.4 kg (217 lb)  04/16/18 101.6 kg (224 lb)  04/04/18 102.1 kg (225 lb 1.6 oz)      Other studies personally reviewed: Additional studies/ records that were reviewed today include: D/C Summary from 04/16/2018  ASSESSMENT AND PLAN:  1.  Chronic Systolic Heart Failure  ECHO 04/10/18 EF 35-40%  NYHA II-III. Volume status stable based on weight and no reported edema.  -Continue torsemide and potassium at current dose.  - Continue Toprol XL 25 mg twice a day.  -  Continue entresto 24-26 mg twice a day + 12.5 mg spironolactone daily.  - Digoxin was stopped post AVR.   2. Aortic Stenosis, S/P AVR 04/10/18  Per Dr Tyler Berger. -Start cardiac rehab once restrictions are lifted in the community.  - I have asked him to walk every hour in his house.    3. ETOH abuse -Congratulated on cessation.   4. PVCs Placed on amiodarone after AVR. Today I will cut back amiodarone to 200 mg once a day. Plan to check EKG and TSH 4-6 weeks.  I discussed potential side effects associated with amiodarone.     COVID screen The patient does not have any symptoms that suggest any further testing/ screening at this time.  Social distancing reinforced today.  Recommended follow-up:  Follow up in 4-6 weeks. He will need an ECHO in 3 months and also cardiac rehab.   Relevant cardiac medications were reviewed at length with the patient today.  I have made changes to his home medications and updated his list.   The patient does not have concerns regarding their medications at this time.   The following changes were made today: I have reduced amiodarone to 200 mg daily.    Labs/ tests ordered today include: He will need BMET, TSH, and EKG in 4-6 weeks.   Patient Risk: After full review of this patients clinical status, I feel that he is at moderate risk for cardiac decompensation at this time.  Today, I have spent  25 minutes with the patient with telehealth technology discussing medication changes and importance of increasing activities.     Jeanmarie Hubert, NP  05/01/2018 9:36 AM  [facility] [address] [facility] [place] [address] [phone] (office) [phone] (fax)

## 2018-05-01 ENCOUNTER — Encounter (HOSPITAL_COMMUNITY): Payer: Self-pay

## 2018-05-01 ENCOUNTER — Other Ambulatory Visit: Payer: Self-pay

## 2018-05-01 ENCOUNTER — Other Ambulatory Visit: Payer: Self-pay | Admitting: Adult Health

## 2018-05-01 ENCOUNTER — Ambulatory Visit (HOSPITAL_COMMUNITY)
Admission: RE | Admit: 2018-05-01 | Discharge: 2018-05-01 | Disposition: A | Discharge status: Home / Self Care | Payer: Managed Care, Other (non HMO) | Source: Ambulatory Visit | Attending: Cardiology | Admitting: Cardiology

## 2018-05-01 VITALS — HR 74 | Wt 217.0 lb

## 2018-05-01 DIAGNOSIS — I5022 Chronic systolic (congestive) heart failure: Secondary | ICD-10-CM

## 2018-05-01 DIAGNOSIS — F1010 Alcohol abuse, uncomplicated: Secondary | ICD-10-CM

## 2018-05-01 DIAGNOSIS — I350 Nonrheumatic aortic (valve) stenosis: Secondary | ICD-10-CM | POA: Diagnosis not present

## 2018-05-01 DIAGNOSIS — F419 Anxiety disorder, unspecified: Secondary | ICD-10-CM

## 2018-05-01 DIAGNOSIS — I493 Ventricular premature depolarization: Secondary | ICD-10-CM | POA: Diagnosis not present

## 2018-05-01 MED ORDER — AMIODARONE HCL 200 MG PO TABS: 200.0000 mg | ORAL_TABLET | Freq: Every day | ORAL | 3 refills | Status: DC

## 2018-05-01 NOTE — Patient Instructions (Addendum)
Needs BMET, TSH, EKG in 4 weeks with office visit.  Take amiodarone 200mg  daily.

## 2018-05-01 NOTE — Addendum Note (Signed)
Encounter addended by: Harvie Junior, CMA on: 05/01/2018 11:24 AM  Actions taken: Clinical Note Signed

## 2018-05-01 NOTE — Progress Notes (Signed)
Spoke with patient and scheduled appointment.   AVS with appointment date and med changes mailed to patient.

## 2018-05-02 ENCOUNTER — Telehealth: Payer: Self-pay | Admitting: Physician Assistant

## 2018-05-02 NOTE — Telephone Encounter (Signed)
[address]       [place] [id number]             [phone]    Tyler Berger [id number]   S/P AVR and Repair of an ascending thoracic aortic aneurysm performed on 04/10/2018 .  Discharged home on 04/16/2018  Medications:  Current Outpatient Medications:  .  acetaminophen (TYLENOL) 500 MG tablet, Take 1,000 mg by mouth every 6 (six) hours as needed for mild pain or headache., Disp: , Rfl:  .  ALPRAZolam (XANAX) 0.5 MG tablet, Take 1 tablet (0.5 mg total) by mouth at bedtime as needed for anxiety. (Patient taking differently: Take 0.25 mg by mouth at bedtime. ), Disp: 30 tablet, Rfl: 1 .  amiodarone (PACERONE) 200 MG tablet, Take 1 tablet (200 mg total) by mouth daily., Disp: 30 tablet, Rfl: 3 .  aspirin EC 325 MG EC tablet, Take 1 tablet (325 mg total) by mouth daily., Disp: 30 tablet, Rfl: 0 .  busPIRone (BUSPAR) 10 MG tablet, Take 1 tablet (10 mg total) by mouth 3 (three) times daily., Disp: 90 tablet, Rfl: 2 .  Cholecalciferol (VITAMIN D3) 125 MCG (5000 UT) TABS, Take 20,000 Units by mouth daily. , Disp: , Rfl:  .  fluticasone furoate-vilanterol (BREO ELLIPTA) 100-25 MCG/INH AEPB, Inhale 1 puff into the lungs daily. Rinse mouth with water after each use (Patient not taking: Reported on 05/01/2018), Disp: 1 each, Rfl: 0 .  metoprolol succinate (TOPROL-XL) 50 MG 24 hr tablet, Take 25 mg by mouth 2 (two) times daily. Take with or immediately following a meal. , Disp: , Rfl:  .  potassium chloride SA (K-DUR,KLOR-CON) 20 MEQ tablet, Take two tabs (40mg ) twice a day for 5 days then take two tabs (40mg ) once a day. (Patient taking differently: Take 40 mEq by mouth daily. ), Disp: 80 tablet, Rfl: 1 .  rosuvastatin (CRESTOR) 10 MG tablet, Take 1 tablet (10 mg total) by mouth daily at 6 PM., Disp: 30 tablet, Rfl: 6 .  sacubitril-valsartan (ENTRESTO) 24-26 MG, Take 1 tablet by mouth 2 (two) times daily., Disp: 60 tablet, Rfl: 6 .  sertraline (ZOLOFT) 100 MG tablet, Take 1 tablet by  mouth once daily, Disp: 60 tablet, Rfl: 0 .  spironolactone (ALDACTONE) 25 MG tablet, Take 0.5 tablets (12.5 mg total) by mouth daily., Disp: 30 tablet, Rfl: 6 .  torsemide (DEMADEX) 20 MG tablet, Take two tabs (40mg )  twice a day for 5 days then take two tabs (40mg ) once a day. (Patient taking differently: Take 40 mg by mouth daily. ), Disp: 80 tablet, Rfl: 1  Problems/Concerns:  Patient states he is doing pretty well.  He was evaluated by the AHF team yesterday.  They adjusted his dose of Amiodarone to 200 mg daily.  He does have some issues with getting up and activity, where he feels like he will be sick.  It resolves once he rests.  His incisions are healing without evidence of infection.  He states his weight has been stable.    Assessment:  Patient is doing well.  He was given instructions on his activity level  Lifting restrictions remain in place for the next several weeks.  He was instructed to continue to walk multiple times per day.  He was instructed to maintain social distancing and avoid public places until current COVID 19 resolves.  He was instructed to contact our office should issues arise  provided.    Follow up Appointment:Office will  contact him in a few weeks to set up follow up

## 2018-05-03 NOTE — Telephone Encounter (Signed)
Called patient to see if he is interested in the Cardiac Rehab Program. Patient expressed interest. Explained scheduling process and went over insurance, patient verbalized understanding. Adv pt our department is closed right now due to COVID-19 and once we resume scheduling we will contact him.

## 2018-05-07 ENCOUNTER — Telehealth: Payer: Self-pay | Admitting: Cardiology

## 2018-05-07 ENCOUNTER — Ambulatory Visit: Payer: Self-pay

## 2018-05-07 MED ORDER — SACUBITRIL-VALSARTAN 24-26 MG PO TABS: 1.0000 | ORAL_TABLET | Freq: Two times a day (BID) | ORAL | 6 refills | Status: DC

## 2018-05-07 NOTE — Telephone Encounter (Signed)
Spoke with pt, Refill sent to the pharmacy electronically. He has private insurance and we will mail the $10 savings card for him to use.

## 2018-05-07 NOTE — Telephone Encounter (Signed)
New Message   Pt c/o medication issue:  1. Name of Medication: sacubitril-valsartan (ENTRESTO) 24-26 MG    2. How are you currently taking this medication (dosage and times per day)?   3. Are you having a reaction (difficulty breathing--STAT)?   4. What is your medication issue? Patient is concerned about the cost of the [facility]. He wants to know if this is something he has to stay on or is there something else he can take. He is not working now and wants to know is there any type of patient assistance. Please call to discuss.

## 2018-05-15 ENCOUNTER — Other Ambulatory Visit: Payer: Self-pay | Admitting: Adult Health

## 2018-05-15 DIAGNOSIS — F419 Anxiety disorder, unspecified: Secondary | ICD-10-CM

## 2018-05-25 ENCOUNTER — Telehealth (HOSPITAL_COMMUNITY): Payer: Self-pay | Admitting: *Deleted

## 2018-05-25 DIAGNOSIS — I5022 Chronic systolic (congestive) heart failure: Secondary | ICD-10-CM

## 2018-05-25 NOTE — Telephone Encounter (Signed)
Pt is sch for f/u appt 4/21 along w/EKG and labs.  Due to Covid-19 pandemic we are limiting pt's coming into office.  Discussed w/Amy Ninfa Meeker, NP who did last visit with pt.  She states labs can wait but will need EKG, have arranged this to be done by Vanita Ingles, RN w/[facility] at pt's home and will change his OV to a telehealth visit.

## 2018-05-29 ENCOUNTER — Other Ambulatory Visit: Payer: Self-pay

## 2018-05-29 ENCOUNTER — Encounter (HOSPITAL_COMMUNITY): Payer: Self-pay

## 2018-05-29 ENCOUNTER — Telehealth: Payer: Self-pay | Admitting: Cardiology

## 2018-05-29 ENCOUNTER — Telehealth: Payer: Self-pay

## 2018-05-29 ENCOUNTER — Telehealth (INDEPENDENT_AMBULATORY_CARE_PROVIDER_SITE_OTHER): Payer: Managed Care, Other (non HMO) | Admitting: Cardiology

## 2018-05-29 ENCOUNTER — Encounter: Payer: Self-pay | Admitting: Cardiology

## 2018-05-29 VITALS — BP 174/113 | HR 92 | Ht 75.0 in | Wt 225.0 lb

## 2018-05-29 DIAGNOSIS — Z7189 Other specified counseling: Secondary | ICD-10-CM | POA: Diagnosis not present

## 2018-05-29 DIAGNOSIS — Z952 Presence of prosthetic heart valve: Secondary | ICD-10-CM

## 2018-05-29 DIAGNOSIS — I493 Ventricular premature depolarization: Secondary | ICD-10-CM | POA: Insufficient documentation

## 2018-05-29 DIAGNOSIS — I5021 Acute systolic (congestive) heart failure: Secondary | ICD-10-CM

## 2018-05-29 DIAGNOSIS — M7989 Other specified soft tissue disorders: Secondary | ICD-10-CM | POA: Insufficient documentation

## 2018-05-29 NOTE — Telephone Encounter (Signed)
Spoke to Mr. Iafrate regarding a referral for home visit to obtain vitals and EKG reading.  Agreed to the time of 2:30-3pm on 05/30/18.

## 2018-05-29 NOTE — Patient Instructions (Signed)
Medication Instructions:  Continue current medications  If you need a refill on your cardiac medications before your next appointment, please call your pharmacy.  Labwork: None Ordered   Testing/Procedures: None Ordered   Follow-Up: . Your physician recommends that you schedule a follow-up appointment in: Thursday 23rd @ 8:30 am .   At [facility], you and your health needs are our priority.  As part of our continuing mission to provide you with exceptional heart care, we have created designated Provider Care Teams.  These Care Teams include your primary Cardiologist (physician) and Advanced Practice Providers (APPs -  Physician Assistants and Nurse Practitioners) who all work together to provide you with the care you need, when you need it.  Thank you for choosing [facility]!!

## 2018-05-29 NOTE — Telephone Encounter (Signed)
New Message:   Patient had open heart surgery in March and patient still hurting and complaining of some swelling in legs, toes are truing blue. Please call patient.

## 2018-05-29 NOTE — Progress Notes (Signed)
Virtual Visit via Telephone Note   This visit type was conducted due to national recommendations for restrictions regarding the COVID-19 Pandemic (e.g. social distancing) in an effort to limit this patient's exposure and mitigate transmission in our community.  Due to his co-morbid illnesses, this patient is at least at moderate risk for complications without adequate follow up.  This format is felt to be most appropriate for this patient at this time.  The patient did not have access to video technology/had technical difficulties with video requiring transitioning to audio format only (telephone).  All issues noted in this document were discussed and addressed.  No physical exam could be performed with this format.  Please refer to the patient's chart for his  consent to telehealth for [facility].   Evaluation Performed:  Follow-up visit  Date:  05/29/2018   ID:  Tyler Berger, Tyler Berger 11/08/1960, MRN [id number]  Patient Location: Home Provider Location: Home  PCP:  Unk Pinto, MD  Cardiologist:  Minus Breeding, MD  Electrophysiologist:  None   Chief Complaint:  Chest pain  History of Present Illness:    Tyler Berger is a 58 y.o. male with aortic stenosis status post AVR and non ischemic CM.  He had surgery on 3/30.    He has had several phone calls to our office and also virtual visit with the surgeons since his discharge.  I reviewed all these records.  I reviewed the hospital records.  Under virtual visit with surgery he complained of some nausea.  During his heart failure telehealth visit he had his amiodarone reduced.  Today he called describing right-sided chest pain.  This is new.  It is sharp.  He is not having fevers chills or cough.  It can be 7 out of 10 in intensity.  It hurts with certain movements.  He denies any new shortness of breath, PND or orthopnea.  However, he does not feel like he is recovering very well.  He is very fatigued.  He does have intermittent lower  extremity swelling.  He showed me today that his feet have been turning blue.  He is not having any pain in his feet.  He is not noticing palpitations, presyncope or syncope.  His weight is down about 5 pounds since his weight when he got home from the hospital.  The patient does not have symptoms concerning for COVID-19 infection (fever, chills, cough, or new shortness of breath).    Past Medical History:  Diagnosis Date   Anxiety    Aortic root aneurysm (HCC)    Aortic stenosis    Ascending aortic aneurysm (HCC)    CAD (coronary artery disease)    Chronic combined systolic and diastolic congestive heart failure (HCC)    Dilated aortic root (HCC)    Hypertension    Non-ischemic cardiomyopathy (HCC)    Nonischemic cardiomyopathy (HCC)    S/P aortic valve replacement with bioprosthetic valve 04/10/2018   29 mm Edwards Inspiris Resilia stented bovine pericardial tissue valve   S/P ascending aortic aneurysm repair 04/10/2018   26 mm Hemashield Platinum supracoronary straight graft   SVT (supraventricular tachycardia) (HCC)    Viral myocarditis 2004   Vitamin D deficiency    Past Surgical History:  Procedure Laterality Date   AORTIC VALVE REPLACEMENT N/A 04/10/2018   Procedure: Aortic Valve Replacement (Avr), USING INSPIRIS 29MM;  Surgeon: Rexene Alberts, MD;  Location: [place];  Service: Open Heart Surgery;  Laterality: N/A;   ASCENDING AORTIC ROOT  REPLACEMENT N/A 04/10/2018   Procedure: RESECTION AND GRAFTING OF ASCENDING AORTIC ROOT ANUERYSM (SUPRACORONARY);  Surgeon: Rexene Alberts, MD;  Location: [place];  Service: Open Heart Surgery;  Laterality: N/A;   HERNIA REPAIR Left 2010   MULTIPLE EXTRACTIONS WITH ALVEOLOPLASTY N/A 04/05/2018   Procedure: Extraction of tooth #'s 3, 12, 14,19, 23, 31 and 32 with alveoloplasty and gross debridement of remaining teeth;  Surgeon: Lenn Cal, DDS;  Location: [place];  Service: Oral Surgery;  Laterality: N/A;   ORCHIECTOMY Left  1982   RIGHT HEART CATH AND CORONARY ANGIOGRAPHY N/A 03/28/2018   Procedure: RIGHT HEART CATH AND CORONARY ANGIOGRAPHY;  Surgeon: Larey Dresser, MD;  Location: [place] CV LAB;  Service: Cardiovascular;  Laterality: N/A;   TEE WITHOUT CARDIOVERSION N/A 04/10/2018   Procedure: TRANSESOPHAGEAL ECHOCARDIOGRAM (TEE);  Surgeon: Rexene Alberts, MD;  Location: [place];  Service: Open Heart Surgery;  Laterality: N/A;   VARICOSE VEIN SURGERY Left 05/1998     Current Meds  Medication Sig   acetaminophen (TYLENOL) 500 MG tablet Take 1,000 mg by mouth every 6 (six) hours as needed for mild pain or headache.   ALPRAZolam (XANAX) 0.5 MG tablet TAKE 1 TABLET BY MOUTH AT BEDTIME AS NEEDED FOR ANXIETY   amiodarone (PACERONE) 200 MG tablet Take 1 tablet (200 mg total) by mouth daily.   aspirin EC 325 MG EC tablet Take 1 tablet (325 mg total) by mouth daily.   busPIRone (BUSPAR) 10 MG tablet Take 1 tablet (10 mg total) by mouth 3 (three) times daily.   Cholecalciferol (VITAMIN D3) 125 MCG (5000 UT) TABS Take 20,000 Units by mouth daily.    fluticasone furoate-vilanterol (BREO ELLIPTA) 100-25 MCG/INH AEPB Inhale 1 puff into the lungs daily. Rinse mouth with water after each use   metoprolol succinate (TOPROL-XL) 50 MG 24 hr tablet Take 25 mg by mouth 2 (two) times daily. Take with or immediately following a meal.    potassium chloride SA (K-DUR,KLOR-CON) 20 MEQ tablet Take two tabs (40mg ) twice a day for 5 days then take two tabs (40mg ) once a day. (Patient taking differently: Take 40 mEq by mouth daily. )   rosuvastatin (CRESTOR) 10 MG tablet Take 1 tablet (10 mg total) by mouth daily at 6 PM.   sacubitril-valsartan (ENTRESTO) 24-26 MG Take 1 tablet by mouth 2 (two) times daily.   sertraline (ZOLOFT) 100 MG tablet Take 1 tablet by mouth once daily   spironolactone (ALDACTONE) 25 MG tablet Take 0.5 tablets (12.5 mg total) by mouth daily.   torsemide (DEMADEX) 20 MG tablet Take two tabs (40mg )   twice a day for 5 days then take two tabs (40mg ) once a day. (Patient taking differently: Take 40 mg by mouth daily. )     Allergies:   Patient has no known allergies.   Social History   Tobacco Use   Smoking status: Former Smoker    Years: 30.00    Types: Cigarettes    Last attempt to quit: 03/28/2018    Years since quitting: 0.1   Smokeless tobacco: Never Used  Substance Use Topics   Alcohol use: Not Currently   Drug use: Never     Family Hx: The patient's family history includes Alzheimer's disease in his mother; Cancer in his father and mother; Diabetes in his mother; Hypertension in his father, mother, and sister.  ROS:   Please see the history of present illness.    As stated in the HPI and negative  for all other systems.   Prior CV studies:   The following studies were reviewed today:   Labs/Other Tests and Data Reviewed:    EKG:  No ECG reviewed.  Recent Labs: 03/28/2018: B Natriuretic Peptide 1,155.1; TSH 1.643 04/04/2018: ALT 35 04/14/2018: Magnesium 1.9 04/16/2018: BUN 14; Creatinine, Ser 0.85; Hemoglobin 11.0; Platelets 142; Potassium 3.9; Sodium 138   Recent Lipid Panel Lab Results  Component Value Date/Time   CHOL 140 03/28/2018 06:59 PM   TRIG 75 03/28/2018 06:59 PM   HDL 21 (L) 03/28/2018 06:59 PM   CHOLHDL 6.7 03/28/2018 06:59 PM   LDLCALC 104 (H) 03/28/2018 06:59 PM   LDLCALC 104 (H) 06/01/2017 03:48 PM    Wt Readings from Last 3 Encounters:  05/29/18 225 lb (102.1 kg)  05/01/18 217 lb (98.4 kg)  04/16/18 224 lb (101.6 kg)     Objective:    Vital Signs:  BP (!) 174/113    Pulse 92    Ht 6\' 3"  (1.905 m)    Wt 225 lb (102.1 kg)    BMI 28.12 kg/m    GENERAL:  Well appearing and in no distress CHEST:  Unremarkable,  EXT:  There does not appear to be edema but there is dependent rubor NEURO:  Grossly intact.   ASSESSMENT & PLAN:    CHEST PAIN:  The pain is likely musculoskeletal.  However, given his multiple phone calls and complaints  postop he needs to be seen in person in the office and I will evaluate him first thing Thursday morning.  I will get an EKG at that time.  For now no change in medications.  SWELLING: His swelling today is mild visually.  For now no change in medications.  He seems to have dependent rubor.  I will evaluate as above.  AVR:  Continue current therapy.  No evidence of fever or chills  NON ISCHEMIC CARDIOMYOPATHY: Continue current therapy.   COVID-19 Education: The signs and symptoms of COVID-19 were discussed with the patient and how to seek care for testing (follow up with PCP or arrange E-visit).  The importance of social distancing was discussed today.  Time:   Today, I have spent 20 minutes with the patient with telehealth technology discussing the above problems.     Medication Adjustments/Labs and Tests Ordered: Current medicines are reviewed at length with the patient today.  Concerns regarding medicines are outlined above.   Tests Ordered: No orders of the defined types were placed in this encounter.   Medication Changes: No orders of the defined types were placed in this encounter.   Disposition:  Follow up in the office Thursday morning.   Signed, Minus Breeding, MD  05/29/2018 1:15 PM    [facility]

## 2018-05-29 NOTE — Telephone Encounter (Signed)
Spoke with pt, he had CABG 04/10/18. He continues to have discomfort in the right side of the chest near his incision, it is there when he moves or coughs. He also will have occasional swelling of his feet and his toes will turn blue. He will get up and move around and the toes will be pink again. He denies any SOB and he is resting in a recliner with his feet elevated. He has not been able to start rehab and or see anyone. He has a lot of questions and concerns. He is scheduled for a tele-health visit today with dr hochrein.

## 2018-05-30 NOTE — Progress Notes (Signed)
Tyler Berger     DOB: 28-Sep-1960  Purpose of Visit: Vitals, EKG HF provider: Aundra Dubin  Medications: Is the patient taking all medications listed on MAR from Epic? Yes List any medications that are not being taken correctly: n/a  List any medication refills needed: n/a  Is the patient able to pick up medications? Yes  Vitals: BP:  118/70  HR: 78   Oxygen: 98% Weight:   225lbs        Physical Exam: Circle, add more if applicable.  Lung sounds:   Heart sounds: regular  Peripheral edema: yes, non-pitting, and states toes have been purple at times, but both are getting better  Wounds: yes Location: surgical incision to chest and abdomen, healing well.  Any patient concerns?  Intermittent RT sided chest pains, sharp, mainly w/exertion or movement of Rt arm.  Pain 0/10 during home visit.  States he hasn't been able to start cardiac rehab d/t pandemic/closure of Cardiac Rehab but has tried to walk in the yard and around the house to build endurance.  States he feels he should be farther along in his recovery than he is currently. States he had not had a post-op visit as of yet and b/c of the intermittent RT chest pain, he called and has an appointment to see Dr. Percival Spanish @ 8:30am tomorrow (05/31/18).    ReDS Vest/Clip Reading: N/A  Rhythm Strip: sent to Caryl Pina, NP and Dr. Aundra Dubin via [facility] secure email  Is Home Health recommended? No If yes, state reason:   Vanita Ingles, RN 05/30/18

## 2018-05-30 NOTE — Progress Notes (Signed)
Cardiology Office Note   Date:  05/31/2018   ID:  Loranzo, Desha 1960-12-01, MRN [id number]  PCP:  Unk Pinto, MD  Cardiologist:   Minus Breeding, MD Referring:  ED MD  Chief Complaint  Patient presents with  . Chest Pain  . Edema    ankles/ feet      History of Present Illness: Tyler Berger is a 58 y.o. male who is referredfor evaluation of chest pain.  He is status post AVR for bicuspid valve.  He has a dilated cardiomyopathy.  He has called our office several times since his procedure and had virtual visits.  Most recently he was complaining because his feet were blue and he was having a sharp chest pain.  He also just did not feel like he was recovering as quickly as he should.  He reports that he has had right upper chest pain that is somewhat sporadic.  It is worse with certain movements.  He has had a mild cough but this has been since he got out of the hospital.  Is not productive.  He has had no increased shortness of breath, PND or orthopnea.  He has not had any fevers or chills.  He has had a little bit of leg swelling.  He was worried because his toes were blue.  He did have a home health nurse who came out yesterday.  He said his systolic blood pressure was 117/70 and O2 sat was 98%.  An EKG was done.   He has not noted any palpitations, presyncope or syncope.     Past Medical History:  Diagnosis Date  . Anxiety   . Aortic root aneurysm ([place])   . Aortic stenosis   . Ascending aortic aneurysm ([place])   . CAD (coronary artery disease)   . Chronic combined systolic and diastolic congestive heart failure ([place])   . Dilated aortic root ([place])   . Hypertension   . Non-ischemic cardiomyopathy ([place])   . Nonischemic cardiomyopathy ([place])   . S/P aortic valve replacement with bioprosthetic valve 04/10/2018   29 mm Edwards Inspiris Resilia stented bovine pericardial tissue valve  . S/P ascending aortic aneurysm repair 04/10/2018   26 mm Hemashield Platinum supracoronary  straight graft  . SVT (supraventricular tachycardia) ([place])   . Viral myocarditis 2004  . Vitamin D deficiency     Past Surgical History:  Procedure Laterality Date  . AORTIC VALVE REPLACEMENT N/A 04/10/2018   Procedure: Aortic Valve Replacement (Avr), USING INSPIRIS 29MM;  Surgeon: Rexene Alberts, MD;  Location: [place];  Service: Open Heart Surgery;  Laterality: N/A;  . ASCENDING AORTIC ROOT REPLACEMENT N/A 04/10/2018   Procedure: RESECTION AND GRAFTING OF ASCENDING AORTIC ROOT ANUERYSM (SUPRACORONARY);  Surgeon: Rexene Alberts, MD;  Location: [place];  Service: Open Heart Surgery;  Laterality: N/A;  . HERNIA REPAIR Left 2010  . MULTIPLE EXTRACTIONS WITH ALVEOLOPLASTY N/A 04/05/2018   Procedure: Extraction of tooth #'s 3, 12, 14,19, 23, 31 and 32 with alveoloplasty and gross debridement of remaining teeth;  Surgeon: Lenn Cal, DDS;  Location: [place];  Service: Oral Surgery;  Laterality: N/A;  . ORCHIECTOMY Left 1982  . RIGHT HEART CATH AND CORONARY ANGIOGRAPHY N/A 03/28/2018   Procedure: RIGHT HEART CATH AND CORONARY ANGIOGRAPHY;  Surgeon: Larey Dresser, MD;  Location: [place] CV LAB;  Service: Cardiovascular;  Laterality: N/A;  . TEE WITHOUT CARDIOVERSION N/A 04/10/2018   Procedure: TRANSESOPHAGEAL ECHOCARDIOGRAM (TEE);  Surgeon: Darylene Price  H, MD;  Location: [place];  Service: Open Heart Surgery;  Laterality: N/A;  . VARICOSE VEIN SURGERY Left 05/1998     Current Outpatient Medications  Medication Sig Dispense Refill  . acetaminophen (TYLENOL) 500 MG tablet Take 1,000 mg by mouth every 6 (six) hours as needed for mild pain or headache.    . ALPRAZolam (XANAX) 0.5 MG tablet TAKE 1 TABLET BY MOUTH AT BEDTIME AS NEEDED FOR ANXIETY 30 tablet 0  . amiodarone (PACERONE) 200 MG tablet Take 1 tablet (200 mg total) by mouth daily. 30 tablet 3  . aspirin EC 325 MG EC tablet Take 1 tablet (325 mg total) by mouth daily. 30 tablet 0  . busPIRone (BUSPAR) 10 MG tablet Take 1 tablet (10 mg  total) by mouth 3 (three) times daily. 90 tablet 2  . Cholecalciferol (VITAMIN D3) 125 MCG (5000 UT) TABS Take 20,000 Units by mouth daily.     . metoprolol succinate (TOPROL-XL) 50 MG 24 hr tablet Take 25 mg by mouth 2 (two) times daily. Take with or immediately following a meal.     . potassium chloride SA (K-DUR,KLOR-CON) 20 MEQ tablet Take two tabs (40mg ) twice a day for 5 days then take two tabs (40mg ) once a day. (Patient taking differently: Take 40 mEq by mouth daily. ) 80 tablet 1  . rosuvastatin (CRESTOR) 10 MG tablet Take 1 tablet (10 mg total) by mouth daily at 6 PM. 30 tablet 6  . sacubitril-valsartan (ENTRESTO) 24-26 MG Take 1 tablet by mouth 2 (two) times daily. 60 tablet 6  . sertraline (ZOLOFT) 100 MG tablet Take 1 tablet by mouth once daily 60 tablet 0  . spironolactone (ALDACTONE) 25 MG tablet Take 0.5 tablets (12.5 mg total) by mouth daily. 30 tablet 6  . fluticasone furoate-vilanterol (BREO ELLIPTA) 100-25 MCG/INH AEPB Inhale 1 puff into the lungs daily. Rinse mouth with water after each use (Patient not taking: Reported on 05/31/2018) 1 each 0  . torsemide (DEMADEX) 20 MG tablet Take two tabs (40mg )  twice a day for 5 days then take two tabs (40mg ) once a day. (Patient not taking: Reported on 05/31/2018) 80 tablet 1   No current facility-administered medications for this visit.     Allergies:   Patient has no known allergies.     ROS:  Please see the history of present illness.   Otherwise, review of systems are positive for none.   All other systems are reviewed and negative.    PHYSICAL EXAM: VS:  BP 118/72   Wt 233 lb 12.8 oz (106.1 kg)   BMI 29.22 kg/m  , BMI Body mass index is 29.22 kg/m. GENERAL:  Well appearing NECK:  No jugular venous distention, waveform within normal limits, carotid upstroke brisk and symmetric, no bruits, no thyromegaly LUNGS:  Clear to auscultation bilaterally CHEST:  Well healed sternotomy scar.  Mild chest wall tenderness to palpation on  the right upper chest.  HEART:  PMI not displaced or sustained,S1 and S2 within normal limits, no S3, no S4, no clicks, no rubs, no murmurs ABD:  Flat, positive bowel sounds normal in frequency in pitch, no bruits, no rebound, no guarding, no midline pulsatile mass, no hepatomegaly, no splenomegaly EXT:  2 plus pulses throughout, no edema, no cyanosis no clubbing, dependent rubor   EKG:  EKG is ordered today. Sinus rhythm, rate 61, left axis deviation, nonspecific interventricular conduction delay.   Recent Labs: 03/28/2018: B Natriuretic Peptide 1,155.1; TSH 1.643 04/04/2018: ALT 35  04/14/2018: Magnesium 1.9 04/16/2018: BUN 14; Creatinine, Ser 0.85; Hemoglobin 11.0; Platelets 142; Potassium 3.9; Sodium 138    Lipid Panel    Component Value Date/Time   CHOL 140 03/28/2018 1859   TRIG 75 03/28/2018 1859   HDL 21 (L) 03/28/2018 1859   CHOLHDL 6.7 03/28/2018 1859   VLDL 15 03/28/2018 1859   LDLCALC 104 (H) 03/28/2018 1859   LDLCALC 104 (H) 06/01/2017 1548      Wt Readings from Last 3 Encounters:  05/31/18 233 lb 12.8 oz (106.1 kg)  05/30/18 225 lb (102.1 kg)  05/29/18 225 lb (102.1 kg)      Other studies Reviewed: Additional studies/ records that were reviewed today include: Hospital records and  Review of the above records demonstrates:  Please see elsewhere in the note.     ASSESSMENT AND PLAN:  CHEST PAIN:  This is very atypical.  No change in therapy is indicated.  This appears to be musculoskeletal.    EDEMA:  He has trace edema.  He has excellent pulses.  He does have dependent rubor.  However, no further testing or change in therapy is indicated.   AVR:  He has a normal exam.  EKG as above.  No change in therapy.    SVT:  He has had no recurrence of this.    HTN:  His BP is running slightly low.  No change in therapy.   ACUTE SYSTOLIC HF:    I think that he is euvolemic.  BP will not tolerate med titration.    TOBACCO ABUSE:    He needs to be off of cigarettes  completely.    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  He should qualify for short term disability.    Current medicines are reviewed at length with the patient today.  The patient does not have concerns regarding medicines.  The following changes have been made:  None  Labs/ tests ordered today include:   Orders Placed This Encounter  Procedures  . CBC  . Basic Metabolic Panel (BMET)     Disposition:   FU with me in 2 months.     Signed, Minus Breeding, MD  05/31/2018 9:14 AM    [facility]

## 2018-05-31 ENCOUNTER — Encounter: Payer: Self-pay | Admitting: Cardiology

## 2018-05-31 ENCOUNTER — Telehealth (HOSPITAL_COMMUNITY): Payer: Self-pay | Admitting: Cardiology

## 2018-05-31 ENCOUNTER — Other Ambulatory Visit: Payer: Self-pay

## 2018-05-31 ENCOUNTER — Ambulatory Visit (INDEPENDENT_AMBULATORY_CARE_PROVIDER_SITE_OTHER): Payer: Managed Care, Other (non HMO) | Admitting: Cardiology

## 2018-05-31 VITALS — BP 118/72 | Wt 233.8 lb

## 2018-05-31 DIAGNOSIS — R079 Chest pain, unspecified: Secondary | ICD-10-CM

## 2018-05-31 DIAGNOSIS — R0789 Other chest pain: Secondary | ICD-10-CM

## 2018-05-31 DIAGNOSIS — Z79899 Other long term (current) drug therapy: Secondary | ICD-10-CM

## 2018-05-31 DIAGNOSIS — I10 Essential (primary) hypertension: Secondary | ICD-10-CM

## 2018-05-31 DIAGNOSIS — I2510 Atherosclerotic heart disease of native coronary artery without angina pectoris: Secondary | ICD-10-CM | POA: Insufficient documentation

## 2018-05-31 DIAGNOSIS — E785 Hyperlipidemia, unspecified: Secondary | ICD-10-CM

## 2018-05-31 NOTE — Telephone Encounter (Signed)
Received call from Vanita Ingles, [facility] RN. She was asked to check rhythm strip and vitals on Mr Vest.  One lead rhythm strip showed NSR 74 bpm with wide QRS (no measurement available), one PVC during 30 second strip. BP 118/70, O2 98%, weight 225 lbs. He has an in office visit with Dr Percival Spanish in am. No medication changes at this time.   Georgiana Shore, NP

## 2018-05-31 NOTE — Patient Instructions (Signed)
Medication Instructions:  Continue current medications  If you need a refill on your cardiac medications before your next appointment, please call your pharmacy.  Labwork: CBC and BMP HERE IN OUR OFFICE AT [facility]  You will need to fast. DO NOT EAT OR DRINK PAST MIDNIGHT.     You will NOT need to fast   Take the provided lab slips with you to the lab for your blood draw.   When you have your labs (blood work) drawn today and your tests are completely normal, you will receive your results only by MyChart Message (if you have MyChart) -OR-  A paper copy in the mail.  If you have any lab test that is abnormal or we need to change your treatment, we will call you to review these results.  Testing/Procedures: None Ordered   Follow-Up: . You will need a follow up appointment in June 23rd @ 8:40 am  At [facility], you and your health needs are our priority.  As part of our continuing mission to provide you with exceptional heart care, we have created designated Provider Care Teams.  These Care Teams include your primary Cardiologist (physician) and Advanced Practice Providers (APPs -  Physician Assistants and Nurse Practitioners) who all work together to provide you with the care you need, when you need it.  Thank you for choosing [facility] at [facility]!!

## 2018-06-01 LAB — CBC
Hematocrit: 46.8 % (ref 37.5–51.0)
Hemoglobin: 15.7 g/dL (ref 13.0–17.7)
MCH: 28.9 pg (ref 26.6–33.0)
MCHC: 33.5 g/dL (ref 31.5–35.7)
MCV: 86 fL (ref 79–97)
Platelets: 147 10*3/uL — ABNORMAL LOW (ref 150–450)
RBC: 5.44 x10E6/uL (ref 4.14–5.80)
RDW: 13.8 % (ref 11.6–15.4)
WBC: 6.6 10*3/uL (ref 3.4–10.8)

## 2018-06-01 LAB — BASIC METABOLIC PANEL
BUN/Creatinine Ratio: 23 — ABNORMAL HIGH (ref 9–20)
BUN: 24 mg/dL (ref 6–24)
CO2: 25 mmol/L (ref 20–29)
Calcium: 9.9 mg/dL (ref 8.7–10.2)
Chloride: 96 mmol/L (ref 96–106)
Creatinine, Ser: 1.06 mg/dL (ref 0.76–1.27)
GFR calc Af Amer: 89 mL/min/{1.73_m2} (ref >59)
GFR calc non Af Amer: 77 mL/min/{1.73_m2} (ref >59)
Glucose: 122 mg/dL — ABNORMAL HIGH (ref 65–99)
Potassium: 4.9 mmol/L (ref 3.5–5.2)
Sodium: 137 mmol/L (ref 134–144)

## 2018-06-05 ENCOUNTER — Telehealth (HOSPITAL_COMMUNITY): Payer: Self-pay | Admitting: *Deleted

## 2018-06-05 ENCOUNTER — Other Ambulatory Visit: Payer: Self-pay

## 2018-06-05 ENCOUNTER — Encounter (HOSPITAL_COMMUNITY): Payer: Self-pay

## 2018-06-05 ENCOUNTER — Ambulatory Visit (HOSPITAL_COMMUNITY)
Admission: RE | Admit: 2018-06-05 | Discharge: 2018-06-05 | Disposition: A | Discharge status: Home / Self Care | Payer: Managed Care, Other (non HMO) | Source: Ambulatory Visit | Attending: Internal Medicine | Admitting: Internal Medicine

## 2018-06-05 DIAGNOSIS — I493 Ventricular premature depolarization: Secondary | ICD-10-CM

## 2018-06-05 DIAGNOSIS — I2510 Atherosclerotic heart disease of native coronary artery without angina pectoris: Secondary | ICD-10-CM

## 2018-06-05 DIAGNOSIS — Z952 Presence of prosthetic heart valve: Secondary | ICD-10-CM

## 2018-06-05 DIAGNOSIS — I5022 Chronic systolic (congestive) heart failure: Secondary | ICD-10-CM | POA: Diagnosis not present

## 2018-06-05 DIAGNOSIS — I10 Essential (primary) hypertension: Secondary | ICD-10-CM

## 2018-06-05 MED ORDER — SPIRONOLACTONE 25 MG PO TABS: 25.0000 mg | ORAL_TABLET | Freq: Every day | ORAL | 6 refills | Status: DC

## 2018-06-05 MED ORDER — AMIODARONE HCL 200 MG PO TABS: 200.0000 mg | ORAL_TABLET | Freq: Every day | ORAL | 5 refills | Status: DC

## 2018-06-05 NOTE — Addendum Note (Signed)
Encounter addended by: Valeda Malm, RN on: 06/05/2018 4:49 PM  Actions taken: Visit Navigator Flowsheet section accepted, Clinical Note Signed

## 2018-06-05 NOTE — Patient Instructions (Addendum)
1. Increase spiro to 25 mg daily.   2. STOP Potassium supplement.   3. Decrease amiodarone to 200 mg daily.   4. Labs Jun 12, 2018 8:30A We will only contact you if something comes back abnormal or we need to make some changes. Otherwise no news is good news!   5. Follow up in 10 weeks in early July. follow with Dr Percival Spanish

## 2018-06-05 NOTE — Addendum Note (Signed)
Encounter addended by: Valeda Malm, RN on: 06/05/2018 2:46 PM  Actions taken: Clinical Note Signed, Medication long-term status modified, Pharmacy for encounter modified, Order list changed, Diagnosis association updated

## 2018-06-05 NOTE — Progress Notes (Signed)
LM for patient to call office to discuss AVS.

## 2018-06-05 NOTE — Progress Notes (Signed)
Heart Failure TeleHealth Note  Due to national recommendations of social distancing due to [place] 19, telehealth visit is felt to be most appropriate for this patient at this time.  I discussed the limitations, risks, security and privacy concerns of performing an evaluation and management service by telephone and the availability of in person appointments. I also discussed with the patient that there may be a patient responsible charge related to this service. The patient expressed understanding and agreed to proceed.   ID:  Tyler Berger, DOB 21-Jan-1961, MRN [id number]  Location: Home  Provider location: [address] [place] Type of Visit: Established patient   PCP:  Unk Pinto, MD  Cardiologist:  Minus Breeding, MD Primary HF: Dr Aundra Dubin  Chief Complaint: HF follow up   History of Present Illness: Tyler Berger is a 57 y.o. male with a history of chronic systolic HF, NICM, LBBB, Aortic Stenosis s/p AVR, former smoker (quit 7 weeks ago), and moderate CAD.    On 04/10/18 he underwent AVR by Dr Roxy Manns. Post op TEE showed EF was a improved to 35-40%. He was placed on amiodarone due to increased PVCs. He was discharged off digoxin and toprol xl. Discharge weight was 224 pounds.   Patient presents via audio conferencing for a telehealth visit today. He had a telephone visit on 05/01/18. Amiodarone was decreased at that time. EKG from [place] showed NSR with 1 PVC in 30 second strip. He saw Dr Percival Spanish 4/23 for right-sided CP evaluation. Thought to be musculoskeletal. Overall doing okay. Energy level is poor. He is walking daily up and down the street at incline without SOB or CP. Has occasional ankle edema, but none today. No orthopnea or PND. No cough. Appetite is okay. Mild dizziness, not positional. No palpitations. Still taking amiodarone 200 mg BID, but taking all other medications correctly. Weights stable, 226 lbs today.  BP 118/72 at Dr Hochrein's office last week.  Pt  denies symptoms of cough, fevers, chills, or new SOB worrisome for COVID 19.    Past Medical History:  Diagnosis Date  . Anxiety   . 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 Aortic stenosis   . Ascending aortic aneurysm ([place])   . CAD (coronary artery disease)   . Chronic combined systolic and diastolic congestive heart failure ([place])   . Dilated aortic root ([place])   . Hypertension   . Non-ischemic cardiomyopathy ([place])   . Nonischemic cardiomyopathy ([place])   . S/P aortic valve replacement with bioprosthetic valve 04/10/2018   29 mm Edwards Inspiris Resilia stented bovine pericardial tissue valve  . S/P ascending aortic aneurysm repair 04/10/2018   26 mm Hemashield Platinum supracoronary straight graft  . SVT (supraventricular tachycardia) ([place])   . Viral myocarditis 2004  . Vitamin D deficiency    Past Surgical History:  Procedure Laterality Date  . AORTIC VALVE REPLACEMENT N/A 04/10/2018   Procedure: Aortic Valve Replacement (Avr), USING INSPIRIS 29MM;  Surgeon: Rexene Alberts, MD;  Location: [facility];  Service: Open Heart Surgery;  Laterality: N/A;  . ASCENDING AORTIC ROOT REPLACEMENT N/A 04/10/2018   Procedure: RESECTION AND GRAFTING OF ASCENDING AORTIC ROOT ANUERYSM (SUPRACORONARY);  Surgeon: Rexene Alberts, MD;  Location: [facility];  Service: Open Heart Surgery;  Laterality: N/A;  . HERNIA REPAIR Left 2010  . MULTIPLE EXTRACTIONS WITH ALVEOLOPLASTY N/A 04/05/2018   Procedure: Extraction of tooth #'s 3, 12, 14,19, 23, 31 and 32 with alveoloplasty and gross debridement of remaining  teeth;  Surgeon: Lenn Cal, DDS;  Location: [facility];  Service: Oral Surgery;  Laterality: N/A;  . ORCHIECTOMY Left 1982  . RIGHT HEART CATH AND CORONARY ANGIOGRAPHY N/A 03/28/2018   Procedure: RIGHT HEART CATH AND CORONARY ANGIOGRAPHY;  Surgeon: Larey Dresser, MD;  Location: [facility];  Service: Cardiovascular;  Laterality: N/A;  . TEE WITHOUT CARDIOVERSION N/A 04/10/2018   Procedure: TRANSESOPHAGEAL  ECHOCARDIOGRAM (TEE);  Surgeon: Rexene Alberts, MD;  Location: [facility];  Service: Open Heart Surgery;  Laterality: N/A;  . VARICOSE VEIN SURGERY Left 05/1998     Current Outpatient Medications  Medication Sig Dispense Refill  . amiodarone (PACERONE) 200 MG tablet Take 1 tablet (200 mg total) by mouth daily. (Patient taking differently: Take 200 mg by mouth 2 (two) times daily. ) 30 tablet 3  . aspirin EC 325 MG EC tablet Take 1 tablet (325 mg total) by mouth daily. 30 tablet 0  . metoprolol succinate (TOPROL-XL) 50 MG 24 hr tablet Take 25 mg by mouth 2 (two) times daily. Take with or immediately following a meal.     . potassium chloride SA (K-DUR,KLOR-CON) 20 MEQ tablet Take two tabs (40mg ) twice a day for 5 days then take two tabs (40mg ) once a day. (Patient taking differently: Take 40 mEq by mouth daily. ) 80 tablet 1  . rosuvastatin (CRESTOR) 10 MG tablet Take 1 tablet (10 mg total) by mouth daily at 6 PM. 30 tablet 6  . sacubitril-valsartan (ENTRESTO) 24-26 MG Take 1 tablet by mouth 2 (two) times daily. 60 tablet 6  . spironolactone (ALDACTONE) 25 MG tablet Take 0.5 tablets (12.5 mg total) by mouth daily. 30 tablet 6  . torsemide (DEMADEX) 20 MG tablet Take two tabs (40mg )  twice a day for 5 days then take two tabs (40mg ) once a day. (Patient taking differently: 40 mg daily. Take two tabs (40mg )  twice a day for 5 days then take two tabs (40mg ) once a day.) 80 tablet 1  . acetaminophen (TYLENOL) 500 MG tablet Take 1,000 mg by mouth every 6 (six) hours as needed for mild pain or headache.    . ALPRAZolam (XANAX) 0.5 MG tablet TAKE 1 TABLET BY MOUTH AT BEDTIME AS NEEDED FOR ANXIETY 30 tablet 0  . busPIRone (BUSPAR) 10 MG tablet Take 1 tablet (10 mg total) by mouth 3 (three) times daily. 90 tablet 2  . Cholecalciferol (VITAMIN D3) 125 MCG (5000 UT) TABS Take 20,000 Units by mouth daily.     . fluticasone furoate-vilanterol (BREO ELLIPTA) 100-25 MCG/INH AEPB Inhale 1 puff into the lungs daily.  Rinse mouth with water after each use (Patient not taking: Reported on 05/31/2018) 1 each 0  . sertraline (ZOLOFT) 100 MG tablet Take 1 tablet by mouth once daily 60 tablet 0   No current facility-administered medications for this encounter.     Allergies:   Patient has no known allergies.   Social History:  The patient  reports that he quit smoking about 2 months ago. His smoking use included cigarettes. He quit after 30.00 years of use. He has never used smokeless tobacco. He reports previous alcohol use. He reports that he does not use drugs.   Family History:  The patient's family history includes Alzheimer's disease in his mother; Cancer in his father and mother; Diabetes in his mother; Hypertension in his father, mother, and sister.   ROS:  Please see the history of present illness.   All other systems are  personally reviewed and negative.    Exam:  (Video/Tele Health Call; Exam is subjective and or/visual.) General:  Speaks in full sentences. No resp difficulty. Lungs: Normal respiratory effort with conversation.  Abdomen: No distension per patient report Extremities: Pt denies edema. Neuro: Alert & oriented x 3.   Recent Labs: 03/28/2018: B Natriuretic Peptide 1,155.1; TSH 1.643 04/04/2018: ALT 35 04/14/2018: Magnesium 1.9 05/31/2018: BUN 24; Creatinine, Ser 1.06; Hemoglobin 15.7; Platelets 147; Potassium 4.9; Sodium 137  Personally reviewed   Wt Readings from Last 3 Encounters:  05/31/18 106.1 kg (233 lb 12.8 oz)  05/30/18 102.1 kg (225 lb)  05/29/18 102.1 kg (225 lb)      ASSESSMENT AND PLAN:  1.  Chronic Systolic Heart Failure  ECHO 04/10/18 EF 35-40%  NYHA II. Volume status sounds stable - Continue torsemide 40 mg daily. Stop K with increasing spiro - Continue Toprol XL 25 mg twice a day.  - Continue entresto 24-26 mg twice a day  - Increase spironolactone to 25 mg daily. Stop K supp. K 4.9 (on K 40 daily) and creatinine 1.06 on labs 4/23 - Digoxin was stopped post  AVR.  - He has asked me to forward this note to Dr Percival Spanish so that he is aware of changes.   2. Aortic Stenosis, S/P AVR 04/10/18  Per Dr Roxy Manns. - Start cardiac rehab once restrictions are lifted in the community.  - Continue daily walks. He is trying to increase incrementally.   3. ETOH abuse - Congratulated on cessation. No change.   4. PVCs Placed on amiodarone after AVR.  - He is still taking amio BID. Decrease amiodarone to 200 mg daily. Check TSH and LFTs with labs in 1 week. - EKG with PREP visit showed 1 PVC in 30 second rhythm strip      COVID screen The patient does not have any symptoms that suggest any further testing/ screening at this time.  Social distancing reinforced today.  Patient Risk: After full review of this patients clinical status, I feel that they are at moderate risk for cardiac decompensation at this time.  Orders/Follow up: Increase spiro to 25 mg daily. Stop potassium supplement. Decrease amiodarone to 200 mg daily. Pharmacy is [facility] nurse on [address]. Check CMET and TSH in 1 week in HF clinic (he has not been here before - okay if he wants to go to [facility] for labs instead). Follow up in 8 weeks. Please make appointment after 6/28 appointment with Dr Percival Spanish. He wants to discuss with Dr Percival Spanish whether he needs to still be followed by HF clinic.   Today, I have spent 14 minutes with the patient with telehealth technology discussing the above issues.    Signed, Georgiana Shore, NP  06/05/2018 1:49 PM   [facility] [address] [place] [address] [phone] (office) [phone] (fax)

## 2018-06-05 NOTE — Progress Notes (Signed)
Spoke with patient, reviewed call summary. Verbalized understanding. Instructions sent via mychart as requested.

## 2018-06-06 ENCOUNTER — Encounter: Payer: Self-pay | Admitting: Physician Assistant

## 2018-06-07 ENCOUNTER — Telehealth (HOSPITAL_COMMUNITY): Payer: Self-pay

## 2018-06-07 NOTE — Progress Notes (Signed)
I am fine with this.  I don't think that he needs both clinics.  Please cancel his return with me and I will turn it over the the HF clinic. Please let the patient know that you will be canceling follow up with me in [place]  Thanks for your help. Dr. Percival Spanish

## 2018-06-07 NOTE — Telephone Encounter (Signed)
Spoke with patient as requested by NP Caryl Pina to inform patient that he will continue to be followed by Heart Failure team.  Per Dr. Percival Spanish patient should discontinue visit with his office and be solely followed by HF team.  Pt aware of same and amenable to plan. (see office note on 06/05/18

## 2018-06-07 NOTE — Progress Notes (Signed)
Hey, I cc'd Dr Percival Spanish on my note for [facility] yesterday. He responded and said pt does not need to follow with him anymore and he will turn him over to HF clinic. He asked that we call pt to let him know and cancel the appointment with Hochrein. Can you please call him to let him know? Thanks!

## 2018-06-12 ENCOUNTER — Ambulatory Visit (HOSPITAL_COMMUNITY)
Admission: RE | Admit: 2018-06-12 | Discharge: 2018-06-12 | Disposition: A | Discharge status: Home / Self Care | Payer: Managed Care, Other (non HMO) | Source: Ambulatory Visit | Attending: Internal Medicine | Admitting: Internal Medicine

## 2018-06-12 ENCOUNTER — Other Ambulatory Visit: Payer: Self-pay

## 2018-06-12 DIAGNOSIS — I5022 Chronic systolic (congestive) heart failure: Secondary | ICD-10-CM | POA: Diagnosis not present

## 2018-06-12 LAB — COMPREHENSIVE METABOLIC PANEL
ALT: 28 U/L (ref 0–44)
AST: 26 U/L (ref 15–41)
Albumin: 4.2 g/dL (ref 3.5–5.0)
Alkaline Phosphatase: 71 U/L (ref 38–126)
Anion gap: 10 (ref 5–15)
BUN: 15 mg/dL (ref 6–20)
CO2: 27 mmol/L (ref 22–32)
Calcium: 9.1 mg/dL (ref 8.9–10.3)
Chloride: 101 mmol/L (ref 98–111)
Creatinine, Ser: 1.01 mg/dL (ref 0.61–1.24)
GFR calc Af Amer: >60 mL/min (ref >60)
GFR calc non Af Amer: >60 mL/min (ref >60)
Glucose, Bld: 130 mg/dL — ABNORMAL HIGH (ref 70–99)
Potassium: 4.6 mmol/L (ref 3.5–5.1)
Sodium: 138 mmol/L (ref 135–145)
Total Bilirubin: 0.5 mg/dL (ref 0.3–1.2)
Total Protein: 7.5 g/dL (ref 6.5–8.1)

## 2018-06-12 LAB — TSH: TSH: 1.506 u[IU]/mL (ref 0.350–4.500)

## 2018-06-22 ENCOUNTER — Other Ambulatory Visit: Payer: Self-pay | Admitting: Physician Assistant

## 2018-06-22 DIAGNOSIS — F419 Anxiety disorder, unspecified: Secondary | ICD-10-CM

## 2018-06-25 ENCOUNTER — Telehealth (HOSPITAL_COMMUNITY): Payer: Self-pay | Admitting: *Deleted

## 2018-06-25 NOTE — Telephone Encounter (Signed)
Follow-up call made to patient regarding Cardiac Rehab referral, and pt advised that department is closed temporarily due to COVID-19. Pt received packet from Cardiac Rehab containing nutrition and exercise handouts. Pt is walking 15 minutes most day of the week. Encouraged patient to add 1-2 minutes each walking session with the goal of achieving 30 minutes of walking, and pt is amenable to this. Encouraged patient to do warm-up/cool-down exercises before and after walking to help with muscle soreness. Will continue to follow.  Sol Passer, [place], ACSM CEP [phone]

## 2018-06-28 ENCOUNTER — Other Ambulatory Visit: Payer: Self-pay | Admitting: Physician Assistant

## 2018-06-28 DIAGNOSIS — F419 Anxiety disorder, unspecified: Secondary | ICD-10-CM

## 2018-07-16 ENCOUNTER — Other Ambulatory Visit: Payer: Self-pay | Admitting: Physician Assistant

## 2018-07-24 ENCOUNTER — Telehealth (HOSPITAL_COMMUNITY): Payer: Self-pay

## 2018-07-24 NOTE — Telephone Encounter (Signed)

## 2018-07-25 ENCOUNTER — Encounter (HOSPITAL_COMMUNITY)
Admission: RE | Admit: 2018-07-25 | Discharge: 2018-07-25 | Disposition: A | Discharge status: Home / Self Care | Payer: Self-pay | Source: Ambulatory Visit | Attending: Cardiology | Admitting: Cardiology

## 2018-07-25 ENCOUNTER — Telehealth (HOSPITAL_COMMUNITY): Payer: Self-pay

## 2018-07-25 NOTE — Telephone Encounter (Signed)
Phone call made to Pt to set up virtual CR application. Pt was successful in doing so and was able to connect on the app.

## 2018-07-25 NOTE — Telephone Encounter (Signed)
Called and spoke to pt regarding Virtual Cardiac Rehab.  Pt  was able to download the [facility] app on their smart device with no issues. Pt set up their account and received the following welcome message -"Welcome to the [facility] program. We hope that you will find the exercise program beneficial in your recovery process. Our staff is available to assist with any questions/concerns about your exercise routine. Best wishes". Brief orientation provided to with the advisement to watch the "Intro to Rehab" series located under the Resource tab. Pt verbalized understanding. Will continue to follow and monitor pt progress with feedback as needed.

## 2018-08-06 ENCOUNTER — Ambulatory Visit: Payer: Self-pay | Admitting: Cardiology

## 2018-08-07 ENCOUNTER — Encounter (HOSPITAL_COMMUNITY): Payer: Self-pay | Admitting: *Deleted

## 2018-08-07 NOTE — Progress Notes (Signed)
Pt inactive in [facility] App.  Letter mailed requesting patient to return call regarding this by 08/21/2018. If no response, will discharge from cardiac rehab program.  

## 2018-08-09 ENCOUNTER — Telehealth (HOSPITAL_COMMUNITY): Payer: Self-pay | Admitting: Adult Health

## 2018-08-09 NOTE — Telephone Encounter (Signed)
lvm for PT re:appt & COVID screening. ° °-GSM °

## 2018-08-14 ENCOUNTER — Encounter (HOSPITAL_COMMUNITY): Payer: Self-pay

## 2018-08-14 ENCOUNTER — Ambulatory Visit (HOSPITAL_COMMUNITY)
Admission: RE | Admit: 2018-08-14 | Discharge: 2018-08-14 | Disposition: A | Discharge status: Home / Self Care | Payer: Self-pay | Source: Ambulatory Visit | Attending: Cardiology | Admitting: Cardiology

## 2018-08-14 ENCOUNTER — Other Ambulatory Visit: Payer: Self-pay

## 2018-08-14 ENCOUNTER — Telehealth (HOSPITAL_COMMUNITY): Payer: Self-pay | Admitting: Licensed Clinical Social Worker

## 2018-08-14 VITALS — BP 128/98 | HR 84 | Wt 242.0 lb

## 2018-08-14 DIAGNOSIS — E559 Vitamin D deficiency, unspecified: Secondary | ICD-10-CM | POA: Insufficient documentation

## 2018-08-14 DIAGNOSIS — Z953 Presence of xenogenic heart valve: Secondary | ICD-10-CM | POA: Insufficient documentation

## 2018-08-14 DIAGNOSIS — I110 Hypertensive heart disease with heart failure: Secondary | ICD-10-CM | POA: Insufficient documentation

## 2018-08-14 DIAGNOSIS — Z8249 Family history of ischemic heart disease and other diseases of the circulatory system: Secondary | ICD-10-CM | POA: Insufficient documentation

## 2018-08-14 DIAGNOSIS — I2510 Atherosclerotic heart disease of native coronary artery without angina pectoris: Secondary | ICD-10-CM | POA: Insufficient documentation

## 2018-08-14 DIAGNOSIS — Z79899 Other long term (current) drug therapy: Secondary | ICD-10-CM | POA: Insufficient documentation

## 2018-08-14 DIAGNOSIS — I5042 Chronic combined systolic (congestive) and diastolic (congestive) heart failure: Secondary | ICD-10-CM

## 2018-08-14 DIAGNOSIS — I428 Other cardiomyopathies: Secondary | ICD-10-CM

## 2018-08-14 DIAGNOSIS — F419 Anxiety disorder, unspecified: Secondary | ICD-10-CM | POA: Insufficient documentation

## 2018-08-14 DIAGNOSIS — I493 Ventricular premature depolarization: Secondary | ICD-10-CM

## 2018-08-14 DIAGNOSIS — Z833 Family history of diabetes mellitus: Secondary | ICD-10-CM | POA: Insufficient documentation

## 2018-08-14 DIAGNOSIS — Z87891 Personal history of nicotine dependence: Secondary | ICD-10-CM | POA: Insufficient documentation

## 2018-08-14 DIAGNOSIS — I5022 Chronic systolic (congestive) heart failure: Secondary | ICD-10-CM | POA: Insufficient documentation

## 2018-08-14 DIAGNOSIS — Z820 Family history of epilepsy and other diseases of the nervous system: Secondary | ICD-10-CM | POA: Insufficient documentation

## 2018-08-14 DIAGNOSIS — Z7982 Long term (current) use of aspirin: Secondary | ICD-10-CM | POA: Insufficient documentation

## 2018-08-14 DIAGNOSIS — F1010 Alcohol abuse, uncomplicated: Secondary | ICD-10-CM

## 2018-08-14 DIAGNOSIS — I447 Left bundle-branch block, unspecified: Secondary | ICD-10-CM

## 2018-08-14 LAB — BASIC METABOLIC PANEL
Anion gap: 9 (ref 5–15)
BUN: 24 mg/dL — ABNORMAL HIGH (ref 6–20)
CO2: 26 mmol/L (ref 22–32)
Calcium: 9.4 mg/dL (ref 8.9–10.3)
Chloride: 103 mmol/L (ref 98–111)
Creatinine, Ser: 1.2 mg/dL (ref 0.61–1.24)
GFR calc Af Amer: >60 mL/min (ref >60)
GFR calc non Af Amer: >60 mL/min (ref >60)
Glucose, Bld: 111 mg/dL — ABNORMAL HIGH (ref 70–99)
Potassium: 4.4 mmol/L (ref 3.5–5.1)
Sodium: 138 mmol/L (ref 135–145)

## 2018-08-14 MED ORDER — SACUBITRIL-VALSARTAN 24-26 MG PO TABS: 1.0000 | ORAL_TABLET | Freq: Two times a day (BID) | ORAL | 0 refills | Status: DC

## 2018-08-14 MED ORDER — TORSEMIDE 20 MG PO TABS: ORAL_TABLET | ORAL | 1 refills | Status: DC

## 2018-08-14 MED ORDER — SERTRALINE HCL 100 MG PO TABS: 100.0000 mg | ORAL_TABLET | Freq: Every day | ORAL | 0 refills | Status: DC

## 2018-08-14 NOTE — Progress Notes (Signed)
PCP: Dr Melford Aase  Primary Cardiologist: Dr Aundra Dubin   HPI: Tyler Berger is a 58 y.o. male with a history of chronic systolic HF, NICM, LBBB, Aortic Stenosis s/p AVR,former smoker (quit 7 weeks ago),and moderate CAD.   On 04/10/18 he underwent AVR by Dr Roxy Manns. Post op TEE showed EF was a improved to 35-40%.He was placed on amiodarone due to increased PVCs.He was discharged off digoxin and toprol xl. Discharge weight was 224 pounds.  Today he returns for HF follow up.Overall feeling fine. Denies SOB/PND/Orthopnea. Appetite ok. No fever or chills. Weight at home trending up from 226--->240 pounds. Walking 1/2 mile twice a day. Taking all medications except he ran out of torsemide. He has stopped smoking.  Rarely drinking alcohol.   ROS: All systems negative except as listed in HPI, PMH and Problem List.  SH:  Social History   Socioeconomic History  . Marital status: Married    Spouse name: Not on file  . Number of children: 2  . Years of education: Not on file  . Highest education level: Not on file  Occupational History  . 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 Financial resource strain: Not on file  . Food insecurity    Worry: Not on file    Inability: Not on file  . Transportation needs    Medical: Not on file    Non-medical: Not on file  Tobacco Use  . Smoking status: Former Smoker    Years: 30.00    Types: Cigarettes    Quit date: 03/28/2018    Years since quitting: 0.3  . Smokeless tobacco: Never Used  Substance and Sexual Activity  . Alcohol use: Not Currently  . Drug use: Never  . Sexual activity: Not on file  Lifestyle  . Physical activity    Days per week: Not on file    Minutes per session: Not on file  . Stress: Not on file  Relationships  . Social Herbalist on phone: Not on file    Gets together: Not on file    Attends religious service: Not on file    Active member of club or organization: Not on file    Attends  meetings of clubs or organizations: Not on file    Relationship status: Not on file  . Intimate partner violence    Fear of current or ex partner: Not on file    Emotionally abused: Not on file    Physically abused: Not on file    Forced sexual activity: Not on file  Other Topics Concern  . Not on file  Social History Narrative   Married, med Designer, multimedia.  Two children.      FH:  Family History  Problem Relation Age of Onset  . Diabetes Mother   . Hypertension Mother   . Cancer Mother        breast  . Alzheimer's disease Mother   . Hypertension Father   . Cancer Father        head and neck  . Hypertension Sister     Past Medical History:  Diagnosis Date  . Anxiety   . 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 Aortic stenosis   . Ascending aortic aneurysm ([place])   . CAD (coronary artery disease)   . Chronic combined systolic and diastolic congestive heart failure ([place])   . Dilated aortic root ([place])   . Hypertension   . Non-ischemic cardiomyopathy ([place])   .  Nonischemic cardiomyopathy ([place])   . S/P aortic valve replacement with bioprosthetic valve 04/10/2018   29 mm Edwards Inspiris Resilia stented bovine pericardial tissue valve  . S/P ascending aortic aneurysm repair 04/10/2018   26 mm Hemashield Platinum supracoronary straight graft  . SVT (supraventricular tachycardia) ([place])   . Viral myocarditis 2004  . Vitamin D deficiency     Current Outpatient Medications  Medication Sig Dispense Refill  . acetaminophen (TYLENOL) 500 MG tablet Take 1,000 mg by mouth every 6 (six) hours as needed for mild pain or headache.    . ALPRAZolam (XANAX) 0.5 MG tablet Take 1 /2 to 1 tablet at Bedtime ONLY if needed for Sleep & Limit to 5 days/week 30 tablet 0  . amiodarone (PACERONE) 200 MG tablet Take 1 tablet (200 mg total) by mouth daily. 30 tablet 5  . aspirin EC 325 MG EC tablet Take 1 tablet (325 mg total) by mouth daily. 30 tablet 0  . busPIRone (BUSPAR) 10 MG tablet Take 1 tablet (10 mg total) by  mouth 3 (three) times daily. 90 tablet 2  . Cholecalciferol (VITAMIN D3) 125 MCG (5000 UT) TABS Take 20,000 Units by mouth daily.     . metoprolol succinate (TOPROL-XL) 50 MG 24 hr tablet Take 25 mg by mouth 2 (two) times daily. Take with or immediately following a meal.     . rosuvastatin (CRESTOR) 10 MG tablet Take 1 tablet (10 mg total) by mouth daily at 6 PM. 30 tablet 6  . sacubitril-valsartan (ENTRESTO) 24-26 MG Take 1 tablet by mouth 2 (two) times daily. 60 tablet 6  . spironolactone (ALDACTONE) 25 MG tablet Take 1 tablet (25 mg total) by mouth daily. Please cancel all previous orders for current medication. Change in dosage or pill size. 30 tablet 6   No current facility-administered medications for this encounter.     Vitals:   08/14/18 1040  BP: (!) 128/98  Pulse: 84  SpO2: 97%  Weight: 109.8 kg (242 lb)   Wt Readings from Last 3 Encounters:  08/14/18 109.8 kg (242 lb)  05/31/18 106.1 kg (233 lb 12.8 oz)  05/30/18 102.1 kg (225 lb)     PHYSICAL EXAM: General:  Well appearing. No resp difficulty HEENT: normal Neck: supple. JVP ~10 . Carotids 2+ bilaterally; no bruits. No lymphadenopathy or thryomegaly appreciated. Cor: PMI normal. Regular rate & rhythm. No rubs, gallops or murmurs. Lungs: clear Abdomen: soft, nontender, nondistended. No hepatosplenomegaly. No bruits or masses. Good bowel sounds. Extremities: no cyanosis, clubbing, rash, R and LLE trace-1+ edema Neuro: alert & orientedx3, cranial nerves grossly intact. Moves all 4 extremities w/o difficulty. Affect pleasant.   ECG: NSR 75 bpm personally reviewed. QRS 192 ms    ASSESSMENT & PLAN: .Chronic Systolic Heart Failure  QBHA1/9/37 EF 35-40%. Repeat ECHO  Next visit.  NYHA II. Volume status trending up. Restart torsemide 20 mg every other day.   - Continue Toprol XL 25 mg twice a day.  - Continue entresto 24-26 mg twice a day . Give samples of entresto. Referred to HFSW for assistance for entresto.   -Continue spironolactone to 25 mg daily.  - Digoxin was stopped post AVR. - Check BMET today.   2. Aortic Stenosis, S/P AVR3/3/20  Per Dr Roxy Manns. - He is doing virtual cardiac rehab.    3. ETOH abuse - Rarely drinks alcohol.   4. PVCs Placed on amiodarone after AVR. No PVCs today on EKG. Suspect we can stop amio soon.   5. LBBB  Follow up in 6 weeks with Dr Aundra Dubin and an ECHO.     NP-C  1:46 PM

## 2018-08-14 NOTE — Telephone Encounter (Signed)
CSW consulted to talk with pt regarding Entresto assistance.  CSW spoke with pt who reports he had to stop working when COVID-19 restrictions started due to health concerns but has been paying insurance dues to keep insurance while not working.  Pt states he has now received a notice implying that his coverage has stopped- pt to check in on this but also received notice from [employer] that his entresto will now be full price which endorses that his insurance isn't working.  CSW discussed [employer] patient assistance program for pt assuming he no longer has insurance and placed on in the mail for him to complete.  Pt to complete and bring back to appt next week.  CSW will continue to follow and assist as needed.  [name], LCSW Clinical Social Worker [facility] Desk#: [phone] Cell#: [phone]

## 2018-08-14 NOTE — Progress Notes (Signed)
1 bottle of entresto 24/26mg  given LOT: [id number] AST:05/1961

## 2018-08-14 NOTE — Patient Instructions (Signed)
Labs done today. We will call you only if labs are abnormal. You will need to have repeat labs done in 7 days.  START Torsemide 20mg  (1 tablet) by mouth every other day.   START Zoloft 100mg  (1 tablet) by mouth every day. You will need to follow up with your primary care physician for refills.   You have been given a sample of Entresto 24/26mg    Your physician recommends that you schedule a follow-up appointment and Echo in: 6 weeks with Dr. Aundra Dubin.  Your physician has requested that you have an echocardiogram. Echocardiography is a painless test that uses sound waves to create images of your heart. It provides your doctor with information about the size and shape of your heart and how well your heart's chambers and valves are working. This procedure takes approximately one hour. There are no restrictions for this procedure.  At the [facility], you and your health needs are our priority. As part of our continuing mission to provide you with exceptional heart care, we have created designated Provider Care Teams. These Care Teams include your primary Cardiologist (physician) and Advanced Practice Providers (APPs- Physician Assistants and Nurse Practitioners) who all work together to provide you with the care you need, when you need it.   You may see any of the following providers on your designated Care Team at your next follow up: Marland Kitchen Dr Glori Bickers . Dr Loralie Champagne . Darrick Grinder, NP

## 2018-08-21 ENCOUNTER — Ambulatory Visit (HOSPITAL_COMMUNITY)
Admission: RE | Admit: 2018-08-21 | Discharge: 2018-08-21 | Disposition: A | Discharge status: Home / Self Care | Payer: 59 | Source: Ambulatory Visit | Attending: Cardiology | Admitting: Cardiology

## 2018-08-21 ENCOUNTER — Other Ambulatory Visit: Payer: Self-pay

## 2018-08-21 ENCOUNTER — Other Ambulatory Visit (HOSPITAL_COMMUNITY): Payer: Self-pay | Admitting: Cardiology

## 2018-08-21 ENCOUNTER — Telehealth (HOSPITAL_COMMUNITY): Payer: Self-pay | Admitting: Licensed Clinical Social Worker

## 2018-08-21 DIAGNOSIS — I5042 Chronic combined systolic (congestive) and diastolic (congestive) heart failure: Secondary | ICD-10-CM | POA: Insufficient documentation

## 2018-08-21 LAB — BASIC METABOLIC PANEL
Anion gap: 10 (ref 5–15)
BUN: 19 mg/dL (ref 6–20)
CO2: 26 mmol/L (ref 22–32)
Calcium: 9.2 mg/dL (ref 8.9–10.3)
Chloride: 102 mmol/L (ref 98–111)
Creatinine, Ser: 1.22 mg/dL (ref 0.61–1.24)
GFR calc Af Amer: >60 mL/min (ref >60)
GFR calc non Af Amer: >60 mL/min (ref >60)
Glucose, Bld: 198 mg/dL — ABNORMAL HIGH (ref 70–99)
Potassium: 3.8 mmol/L (ref 3.5–5.1)
Sodium: 138 mmol/L (ref 135–145)

## 2018-08-21 MED ORDER — SACUBITRIL-VALSARTAN 24-26 MG PO TABS: 1.0000 | ORAL_TABLET | Freq: Two times a day (BID) | ORAL | 3 refills | Status: DC

## 2018-08-21 NOTE — Telephone Encounter (Signed)
Pt returned patient portion of [employer] application.  Completed application faxed to [employer] for review- fax confirmation received.  CSW will continue to follow and assist as needed  [name], [facility] Desk#: [phone] Cell#: [phone]

## 2018-08-21 NOTE — Telephone Encounter (Signed)
rx printed to accompany patient assistance application  

## 2018-08-22 ENCOUNTER — Other Ambulatory Visit: Payer: Self-pay | Admitting: Internal Medicine

## 2018-08-22 DIAGNOSIS — F419 Anxiety disorder, unspecified: Secondary | ICD-10-CM

## 2018-08-23 ENCOUNTER — Telehealth (HOSPITAL_COMMUNITY): Payer: Self-pay

## 2018-08-23 ENCOUNTER — Telehealth (HOSPITAL_COMMUNITY): Payer: Self-pay | Admitting: Licensed Clinical Social Worker

## 2018-08-23 NOTE — Telephone Encounter (Signed)
Clinic received notification that pt has been approved to receive Entresto assistance through [employer] program.  Pt ID: [id number] Expires: 08/23/2019  CSW called pt and left message informing him of approval and providing him with number for [employer] to call and set up initial shipment.  CSW will continue to follow and assist as needed  [name], [facility] Desk#: [phone] Cell#: [phone]

## 2018-09-11 ENCOUNTER — Other Ambulatory Visit (HOSPITAL_COMMUNITY): Payer: Self-pay | Admitting: *Deleted

## 2018-09-11 MED ORDER — ROSUVASTATIN CALCIUM 10 MG PO TABS: 10.0000 mg | ORAL_TABLET | Freq: Every day | ORAL | 3 refills | Status: DC

## 2018-09-13 MED FILL — ROSUVASTATIN CALCIUM 10 MG: 10 | 30 days supply | Qty: 30 | Fill #0

## 2018-09-18 ENCOUNTER — Encounter (HOSPITAL_COMMUNITY): Payer: Self-pay

## 2018-09-18 NOTE — Progress Notes (Signed)
Letter sent to Pt due to inactivity with virtual CR app. Pt has not been logging exercise. If Pt does not respond by 10/02/18 will be dropped from the virtual CR app.   Deitra Mayo BS, ACSM CEP 09/18/2018 9:12 AM

## 2018-09-24 NOTE — Progress Notes (Signed)
Patient ID: Tyler Berger, male   DOB: 1960/10/13, 58 y.o.   MRN: [id number] COMPLETE PHYSICAL  Assessment and Plan:  Encounter for general adult medical examination with abnormal findings Patient does not have insurance- will do minimal labs Is due for colonoscopy as well, no symptoms at this time.   Essential hypertension Not at goal with CHF Taking BP at home 130-150's at home Will message heart failure clinic to see if they are okay with increasing toprol to 50mg  BID Declines kidney labs today, states will get at heart failure clinic  Hyperlipidemia, unspecified hyperlipidemia type -     Lipid panel check lipids decrease fatty foods increase activity.   Chronic combined systolic and diastolic congestive heart failure [facility]) May try to maximize medications/blood pressure Will get repeat echo 09/01  LONG discussion about sodium intact/sugar intact and maximizing medications to help his heart not work as hard.  Goal weight is likely closer to 235-240 Weight daily and bring log of weight and BP to heart failure clinic  Dilated aortic root [facility]) S/p repair  Non-ischemic cardiomyopathy [facility]) May try to maximize medications/blood pressure Will get repeat echo 09/01  LONG discussion about sodium intact/sugar intact and maximizing medications to help his heart not work as hard.  Goal weight is likely closer to 235-240 Weight daily and bring log of weight and BP to heart failure clinic  SVT (supraventricular tachycardia) (HCC) NSR at this time, hopeful that he is able to get off the amiodarone pending his next evaluation with heart failure clinic  Ascending aortic aneurysm [facility]) S/p repair  Tobacco abuse Quit smoking, congratulated the patient and encouraged him to continue.   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 Further disposition pending results of labs. Future Appointments  Date Time Provider [place]  10/09/2018  8:00 AM [facility] ECHO OP 1 [facility]  10/09/2018  9:00 AM Larey Dresser, MD [facility] None  10/01/2019  3:00 PM Vicie Mutters, PA-C [facility] None    HPI 58 y.o. male  presents for CPE and 3 month follow up with hypertension, hyperlipidemia,  and vitamin D.  Patient is s/p aortic valve replacement with biosynthetic valve and thoracic aortic aneurysm repair on 27/78/2423, has systolic heart failure with post op TEE showing EF 35-40%.  He was placed on amiodarone due to PVCs, following with Dr. Aundra Dubin at the heart failure clinic. He has a follow up to repeat echo next visit 10/2018.   He is on entresto getting free from [place] toprol 25mg  BID aldactone 25mg  daily  demadex 20 mg QOD. HAS BEEN TAKING LASIX 40MG  THE OTHER DAYS due to swelling. Lowest weight in the hospital was 224, but patient states this was after he had his teeth pulled and on a liquid diet for 1 week prior.   He is walking his neighborhood without any issues, walks 30-35 mins daily. He has quit smoking AND STATES HIS WEIGHT IS UP DUE TO THIS.  No fatigue in the AM, no snoring, no symptoms of sleep  apnea.  BMI is Body mass index is 31.75 kg/m., he is working on diet and exercise. Wt Readings from Last 3 Encounters:  09/25/18 254 lb (115.2 kg)  08/14/18 242 lb (109.8 kg)  05/31/18 233 lb 12.8 oz (106.1 kg)    He has had some depression, will take 1/2 of xanax before bed.  Lab Results  Component Value Date   GFRNONAA >60 08/21/2018   Lab Results  Component Value Date   CREATININE  1.22 08/21/2018   BUN 19 08/21/2018   NA 138 08/21/2018   K 3.8 08/21/2018   CL 102 08/21/2018   CO2 26 08/21/2018   His blood pressure has been controlled at home,, today their BP is BP: 140/82 He is on cholesterol medication, crestor 10 and denies myalgias. His cholesterol is not at goal less than 70. The cholesterol last visit was:   Lab Results  Component Value Date   CHOL 140 03/28/2018   HDL 21 (L) 03/28/2018   LDLCALC 104 (H) 03/28/2018   TRIG 75 03/28/2018   CHOLHDL 6.7 03/28/2018   Last A1C in the office was,:  Lab Results  Component Value Date   HGBA1C 5.7 (H) 04/04/2018   Patient is on Vitamin D supplement.   Lab Results  Component Value Date   VD25OH 40 04/22/2013     Current Medications:    Current Outpatient Medications (Cardiovascular):  .  amiodarone (PACERONE) 200 MG tablet, Take 1 tablet (200 mg total) by mouth daily. .  metoprolol succinate (TOPROL-XL) 50 MG 24 hr tablet, Take 25 mg by mouth 2 (two) times daily. Take with or immediately following a meal.  .  rosuvastatin (CRESTOR) 10 MG tablet, Take 1 tablet (10 mg total) by mouth daily at 6 PM. .  sacubitril-valsartan (ENTRESTO) 24-26 MG, Take 1 tablet by mouth 2 (two) times daily. Marland Kitchen  spironolactone (ALDACTONE) 25 MG tablet, Take 1 tablet (25 mg total) by mouth daily. Please cancel all previous orders for current medication. Change in dosage or pill size. .  torsemide (DEMADEX) 20 MG tablet, Take 1 tablet by mouth every other day   Current Outpatient Medications (Analgesics):  .  acetaminophen (TYLENOL) 500 MG tablet, Take 1,000 mg by mouth every 6 (six) hours as needed for mild pain or headache. Marland Kitchen  aspirin EC 325 MG EC tablet, Take 1 tablet (325 mg total) by mouth daily.   Current Outpatient Medications (Other):  Marland Kitchen  ALPRAZolam (XANAX) 0.5 MG tablet, TAKE 1/2 TO 1 (ONE-HALF TO ONE) TABLET BY MOUTH AT BEDTIME ONLY  IF  NEEDED  FOR  SLEEP  AND  LIMIT  TO  5  DAYS/WEEK .  busPIRone (BUSPAR) 10 MG tablet,  Take 1 tablet (10 mg total) by mouth 3 (three) times daily. .  Cholecalciferol (VITAMIN D3) 125 MCG (5000 UT) TABS, Take 20,000 Units by mouth daily.  .  sertraline (ZOLOFT) 100 MG tablet, Take 1 tablet (100 mg total) by mouth daily.  Medical History:  Past Medical History:  Diagnosis Date  . Anxiety   . 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 Aortic stenosis   . Ascending aortic aneurysm ([place])   . CAD (coronary artery disease)   . Chronic combined systolic and diastolic congestive heart failure ([place])   . Dilated aortic root ([place])   . Hypertension   . Non-ischemic cardiomyopathy ([place])   . Nonischemic cardiomyopathy ([place])   . S/P aortic valve replacement with bioprosthetic valve 04/10/2018   29 mm Edwards Inspiris Resilia stented  bovine pericardial tissue valve  . S/P ascending aortic aneurysm repair 04/10/2018   26 mm Hemashield Platinum supracoronary straight graft  . SVT (supraventricular tachycardia) ([place])   . Viral myocarditis 2004  . Vitamin D deficiency    Allergies No Known Allergies  Immunization History  Administered Date(s) Administered  . Influenza-Unspecified 12/05/2016  . Pneumococcal-Unspecified 02/08/1992  . Td 02/07/1997  . Tdap 06/01/2017   Colonoscopy: will consider it CXR 2008 Echo 2008 Stress test 2008  SURGICAL HISTORY He  has a past surgical history that includes Hernia repair (Left, 2010); Orchiectomy (Left, 1982); Varicose vein surgery (Left, 05/1998); RIGHT HEART CATH AND CORONARY ANGIOGRAPHY (N/A, 03/28/2018); Multiple extractions with alveoloplasty (N/A, 04/05/2018); Ascending aortic root replacement (N/A, 04/10/2018); TEE without cardioversion (N/A, 04/10/2018); and Aortic valve replacement (N/A, 04/10/2018). FAMILY HISTORY His family history includes Alzheimer's disease in his mother; Cancer in his father and mother; Diabetes in his mother; Hypertension in his father, mother, and sister. SOCIAL HISTORY He  reports that he quit smoking about 5 months ago. His  smoking use included cigarettes. He quit after 30.00 years of use. He has never used smokeless tobacco. He reports previous alcohol use. He reports that he does not use drugs.   Review of Systems  Constitutional: Negative.   HENT: Negative.   Eyes: Negative.   Respiratory: Negative.   Cardiovascular: Negative.   Gastrointestinal: Negative.   Genitourinary: Negative.   Musculoskeletal: Negative.   Skin: Negative.   Neurological: Negative.   Endo/Heme/Allergies: Negative.   Psychiatric/Behavioral: Negative.    Physical Exam: BP 140/82   Pulse 85   Temp 97.9 F (36.6 C)   Ht 6\' 3"  (1.905 m)   Wt 254 lb (115.2 kg)   SpO2 97%   BMI 31.75 kg/m  Wt Readings from Last 3 Encounters:  09/25/18 254 lb (115.2 kg)  08/14/18 242 lb (109.8 kg)  05/31/18 233 lb 12.8 oz (106.1 kg)  Body mass index is 31.75 kg/m.  General Appearance: Well nourished, in no apparent distress. Eyes: PERRLA, EOMs, conjunctiva no swelling or erythema Sinuses: No Frontal/maxillary tenderness ENT/Mouth: Ext aud canals clear, TMs without erythema, bulging. No erythema, swelling, or exudate on post pharynx.  Tonsils not swollen or erythematous. Hearing normal.  Neck: Supple, thyroid normal.  Respiratory: Respiratory effort normal, BS equal bilaterally without rales, rhonchi, wheezing or stridor.  Cardio: Regular rate and rhythm, no murmurs, rubs or gallops.  Brisk peripheral pulses with mild bilateral edema, left leg worse than right and large torturous varicose veins bilateral legs with venous stasis Abdomen: Soft, + BS.  Non tender, no guarding, rebound, hernias, masses. Lymphatics: Non tender without lymphadenopathy.  Musculoskeletal: Full ROM, 5/5 strength, normal gait.  Skin: Warm, dry without rashes, lesions, ecchymosis.  Neuro: Cranial nerves intact. Normal muscle tone, no cerebellar symptoms.  Psych: Awake and oriented X 3, normal affect, Insight and Judgment appropriate.    Vicie Mutters 3:09  PM

## 2018-09-25 ENCOUNTER — Ambulatory Visit (INDEPENDENT_AMBULATORY_CARE_PROVIDER_SITE_OTHER): Payer: Self-pay | Admitting: Physician Assistant

## 2018-09-25 ENCOUNTER — Other Ambulatory Visit: Payer: Self-pay

## 2018-09-25 ENCOUNTER — Encounter: Payer: Self-pay | Admitting: Physician Assistant

## 2018-09-25 VITALS — BP 140/82 | HR 85 | Temp 97.9°F | Ht 75.0 in | Wt 254.0 lb

## 2018-09-25 DIAGNOSIS — Z8679 Personal history of other diseases of the circulatory system: Secondary | ICD-10-CM

## 2018-09-25 DIAGNOSIS — R7309 Other abnormal glucose: Secondary | ICD-10-CM

## 2018-09-25 DIAGNOSIS — I428 Other cardiomyopathies: Secondary | ICD-10-CM

## 2018-09-25 DIAGNOSIS — I493 Ventricular premature depolarization: Secondary | ICD-10-CM

## 2018-09-25 DIAGNOSIS — I2510 Atherosclerotic heart disease of native coronary artery without angina pectoris: Secondary | ICD-10-CM

## 2018-09-25 DIAGNOSIS — I447 Left bundle-branch block, unspecified: Secondary | ICD-10-CM

## 2018-09-25 DIAGNOSIS — Z0001 Encounter for general adult medical examination with abnormal findings: Secondary | ICD-10-CM

## 2018-09-25 DIAGNOSIS — E559 Vitamin D deficiency, unspecified: Secondary | ICD-10-CM

## 2018-09-25 DIAGNOSIS — I471 Supraventricular tachycardia: Secondary | ICD-10-CM

## 2018-09-25 DIAGNOSIS — I10 Essential (primary) hypertension: Secondary | ICD-10-CM

## 2018-09-25 DIAGNOSIS — Z79899 Other long term (current) drug therapy: Secondary | ICD-10-CM

## 2018-09-25 DIAGNOSIS — I712 Thoracic aortic aneurysm, without rupture: Secondary | ICD-10-CM

## 2018-09-25 DIAGNOSIS — I5042 Chronic combined systolic (congestive) and diastolic (congestive) heart failure: Secondary | ICD-10-CM

## 2018-09-25 DIAGNOSIS — Z720 Tobacco use: Secondary | ICD-10-CM

## 2018-09-25 DIAGNOSIS — F419 Anxiety disorder, unspecified: Secondary | ICD-10-CM

## 2018-09-25 DIAGNOSIS — Z953 Presence of xenogenic heart valve: Secondary | ICD-10-CM

## 2018-09-25 DIAGNOSIS — E785 Hyperlipidemia, unspecified: Secondary | ICD-10-CM

## 2018-09-25 DIAGNOSIS — I77810 Thoracic aortic ectasia: Secondary | ICD-10-CM

## 2018-09-25 DIAGNOSIS — Z9889 Other specified postprocedural states: Secondary | ICD-10-CM

## 2018-09-25 DIAGNOSIS — I7121 Aneurysm of the ascending aorta, without rupture: Secondary | ICD-10-CM

## 2018-09-25 NOTE — Patient Instructions (Addendum)
Do the following things EVERYDAY: 1) Weigh yourself in the morning before breakfast or at the same time every day. Write it down and keep it in a log. 2) Take your medicines as prescribed 3) Eat low salt foods--Limit salt (sodium) to 2000 mg per day. Best thing to do is avoid processed foods.   4) Stay as active as you can everyday 5) Limit all fluids for the day to less than 1.5 liters  Call your doctor if:  Anytime you have any of the following symptoms:  1) 2 pound weight gain in 24 hours or 5 pounds in 1 week  2) shortness of breath, with or without a dry hacking cough  3) swelling in the hands, LEGs, feet or stomach  4) if you have to sleep on extra pillows at night in order to breathe. 5) after laying down at night for 20-30 mins, you wake up short of breath.   These can all be signs of fluid overload.    Heart Failure Heart failure means your heart has trouble pumping blood. This makes it hard for your body to work well. Heart failure is usually a long-term (chronic) condition. You must take good care of yourself and follow your doctor's treatment plan. Follow these instructions at home:  Take your heart medicine as told by your doctor. ? Do not stop taking medicine unless your doctor tells you to. ? Do not skip any dose of medicine. ? Refill your medicines before they run out. ? Take other medicines only as told by your doctor or pharmacist.  Stay active if told by your doctor. The elderly and people with severe heart failure should talk with a doctor about physical activity.  Eat heart-healthy foods. Choose foods that are without trans fat and are low in saturated fat, cholesterol, and salt (sodium). This includes fresh or frozen fruits and vegetables, fish, lean meats, fat-free or low-fat dairy foods, whole grains, and high-fiber foods. Lentils and dried peas and beans (legumes) are also good choices.  Limit salt if told by your doctor.  Cook in a healthy way. Roast,  grill, broil, bake, poach, steam, or stir-fry foods.  Limit fluids as told by your doctor.  Weigh yourself every morning. Do this after you pee (urinate) and before you eat breakfast. Write down your weight to give to your doctor.  Take your blood pressure and write it down if your doctor tells you to.  Ask your doctor how to check your pulse. Check your pulse as told.  Lose weight if told by your doctor.  Stop smoking or chewing tobacco. Do not use gum or patches that help you quit without your doctor's approval.  Schedule and go to doctor visits as told.  Nonpregnant women should have no more than 1 drink a day. Men should have no more than 2 drinks a day. Talk to your doctor about drinking alcohol.  Stop illegal drug use.  Stay current with shots (immunizations).  Manage your health conditions as told by your doctor.  Learn to manage your stress.  Rest when you are tired.  If it is really hot outside: ? Avoid intense activities. ? Use air conditioning or fans, or get in a cooler place. ? Avoid caffeine and alcohol. ? Wear loose-fitting, lightweight, and light-colored clothing.  If it is really cold outside: ? Avoid intense activities. ? Layer your clothing. ? Wear mittens or gloves, a hat, and a scarf when going outside. ? Avoid alcohol.  Learn about  heart failure and get support as needed.  Get help to maintain or improve your quality of life and your ability to care for yourself as needed. Contact a doctor if:  You gain weight quickly.  You are more short of breath than usual.  You cannot do your normal activities.  You tire easily.  You cough more than normal, especially with activity.  You have any or more puffiness (swelling) in areas such as your hands, feet, ankles, or belly (abdomen).  You cannot sleep because it is hard to breathe.  You feel like your heart is beating fast (palpitations).  You get dizzy or light-headed when you stand up. Get  help right away if:  You have trouble breathing.  There is a change in mental status, such as becoming less alert or not being able to focus.  You have chest pain or discomfort.  You faint. This information is not intended to replace advice given to you by your health care provider. Make sure you discuss any questions you have with your health care provider. Document Released: 11/03/2007 Document Revised: 07/02/2015 Document Reviewed: 03/12/2012 Elsevier Interactive Patient Education  [employer].     When it comes to diets, agreement about the perfect plan isnt easy to find, even among the experts. Experts at the [place] [employer] developed an idea known as the Healthy Eating Plate. Just imagine a plate divided into logical, healthy portions.  The emphasis is on diet quality:  Load up on vegetables and fruits - one-half of your plate: Aim for color and variety, and remember that potatoes dont count.  Go for whole grains - one-quarter of your plate: Whole wheat, barley, wheat berries, quinoa, oats, brown rice, and foods made with them. If you want pasta, go with whole wheat pasta.  Protein power - one-quarter of your plate: Fish, chicken, beans, and nuts are all healthy, versatile protein sources. Limit red meat.  The diet, however, does go beyond the plate, offering a few other suggestions.  Use healthy plant oils, such as olive, canola, soy, corn, sunflower and peanut. Check the labels, and avoid partially hydrogenated oil, which have unhealthy trans fats.  If youre thirsty, drink water. Coffee and tea are good in moderation, but skip sugary drinks and limit milk and dairy products to one or two daily servings.  The type of carbohydrate in the diet is more important than the amount. Some sources of carbohydrates, such as vegetables, fruits, whole grains, and beans--are healthier than others.  Finally, stay active.

## 2018-09-26 LAB — LIPID PANEL
Cholesterol: 141 mg/dL (ref <200)
HDL: 49 mg/dL (ref >=40)
LDL Cholesterol (Calc): 69 mg/dL (calc)
Non-HDL Cholesterol (Calc): 92 mg/dL (calc) (ref <130)
Total CHOL/HDL Ratio: 2.9 (calc) (ref <5.0)
Triglycerides: 146 mg/dL (ref <150)

## 2018-09-26 LAB — HEMOGLOBIN A1C
Hgb A1c MFr Bld: 6 % of total Hgb — ABNORMAL HIGH (ref <5.7)
Mean Plasma Glucose: 126 (calc)
eAG (mmol/L): 7 (calc)

## 2018-09-26 NOTE — Progress Notes (Signed)
Would be reasonable to increase Toprol XL to 50 mg bid (surprised that this is cheaper than carvedilol) and think Farxiga 10 mg daily would be very reasonable for him.

## 2018-09-27 ENCOUNTER — Telehealth: Payer: Self-pay | Admitting: Physician Assistant

## 2018-09-27 MED ORDER — METOPROLOL SUCCINATE ER 50 MG PO TB24: 50.0000 mg | ORAL_TABLET | Freq: Two times a day (BID) | ORAL | 0 refills | Status: DC

## 2018-09-27 NOTE — Telephone Encounter (Signed)
-----   Message from Larey Dresser, MD sent at 09/26/2018  9:46 PM EDT -----   ----- Message ----- From: Vicie Mutters, PA-C Sent: 09/26/2018   2:08 PM EDT To: Larey Dresser, MD  Hi Dr. Aundra Dubin, We have a mutual patient, Tyler Berger, he is s/p aortic valve replacement with biosynthetic valve and thoracic aortic aneurysm repair on 92/33/0076, and has systolic heart failure last EF 35-40% and he is due to have another echocardiogram with you 09/01 at his appointment. His blood pressure was elevated in the office and has been elevated at home. He is at goal therapy other than his toprol.  He is on 25mg  BID (not on Coreg due to cost), would you be okay with me increasing his Toprol XL to 50mg  BID?  In addition his weight is up, I  discussed diet and fluid restriction with him extensively and told him to bring a list of weights and blood pressure to his appointment with you rather than adjusting his fluid pill.  He is also prediabetic with an A1C of 6.0 so I may be able to provide him with farxiga samples if you would prefer that as a possible treatment for his heart failure.  Thank you for your help and expert advise, Estill Bamberg

## 2018-09-27 NOTE — Telephone Encounter (Signed)
Patient just refilled toprol XL, will increase to 50 mg BID. He states he was getting a discount rate however it was still 40 dollars. Patient informed to check out [place] and after he runs out of the toprol will switch to Carvidolol 25mg  BID.   Want to wait to add the [place], will call in 2-4 weeks and get patient to pick up samples.   He is starting to walk again daily.   He is monitoring his BP and weight to bring to his heart clinic visit.

## 2018-09-30 ENCOUNTER — Other Ambulatory Visit: Payer: Self-pay | Admitting: Adult Health

## 2018-09-30 ENCOUNTER — Other Ambulatory Visit (HOSPITAL_COMMUNITY): Payer: Self-pay | Admitting: Adult Health

## 2018-09-30 DIAGNOSIS — F419 Anxiety disorder, unspecified: Secondary | ICD-10-CM

## 2018-10-05 ENCOUNTER — Telehealth (HOSPITAL_COMMUNITY): Payer: Self-pay | Admitting: Licensed Clinical Social Worker

## 2018-10-05 NOTE — Telephone Encounter (Signed)
CSW consulted to speak with pt regarding insurance concerns.  Pt is currently awaiting decision from disability but has not applied for Medicaid.  CSW explained that he should call DSS to speak with case worker and see if it is worth applying for disability even though pt reports he has money in a retirement account that would likely put him over assets.  CSW then discussed [facility] application.  Explained that if approved it could help cover some or all of his [facility] bills for 240 days prior to approval and 6 months after approval.  Explained that only other option at this time would be to wait till open enrollment which starts in October.  CSW sent pt copy of application and pt will plan to fill out and return ASAP.  CSW will continue to follow and assist as needed  [name], [facility] Desk#: [phone] Cell#: [phone]

## 2018-10-09 ENCOUNTER — Ambulatory Visit (HOSPITAL_COMMUNITY)
Admission: RE | Admit: 2018-10-09 | Discharge: 2018-10-09 | Disposition: A | Discharge status: Home / Self Care | Payer: Self-pay | Source: Ambulatory Visit | Attending: Adult Health | Admitting: Adult Health

## 2018-10-09 ENCOUNTER — Other Ambulatory Visit: Payer: Self-pay

## 2018-10-09 ENCOUNTER — Encounter (HOSPITAL_COMMUNITY): Payer: Self-pay | Admitting: Cardiology

## 2018-10-09 ENCOUNTER — Ambulatory Visit (HOSPITAL_BASED_OUTPATIENT_CLINIC_OR_DEPARTMENT_OTHER)
Admission: RE | Admit: 2018-10-09 | Discharge: 2018-10-09 | Disposition: A | Discharge status: Home / Self Care | Payer: Self-pay | Source: Ambulatory Visit | Attending: Cardiology | Admitting: Cardiology

## 2018-10-09 VITALS — BP 122/90 | HR 65 | Wt 244.4 lb

## 2018-10-09 DIAGNOSIS — I5042 Chronic combined systolic (congestive) and diastolic (congestive) heart failure: Secondary | ICD-10-CM

## 2018-10-09 DIAGNOSIS — Z808 Family history of malignant neoplasm of other organs or systems: Secondary | ICD-10-CM | POA: Insufficient documentation

## 2018-10-09 DIAGNOSIS — F419 Anxiety disorder, unspecified: Secondary | ICD-10-CM | POA: Insufficient documentation

## 2018-10-09 DIAGNOSIS — Q231 Congenital insufficiency of aortic valve: Secondary | ICD-10-CM | POA: Insufficient documentation

## 2018-10-09 DIAGNOSIS — Z7982 Long term (current) use of aspirin: Secondary | ICD-10-CM | POA: Insufficient documentation

## 2018-10-09 DIAGNOSIS — Z87891 Personal history of nicotine dependence: Secondary | ICD-10-CM | POA: Insufficient documentation

## 2018-10-09 DIAGNOSIS — E785 Hyperlipidemia, unspecified: Secondary | ICD-10-CM | POA: Insufficient documentation

## 2018-10-09 DIAGNOSIS — Z803 Family history of malignant neoplasm of breast: Secondary | ICD-10-CM | POA: Insufficient documentation

## 2018-10-09 DIAGNOSIS — I110 Hypertensive heart disease with heart failure: Secondary | ICD-10-CM | POA: Insufficient documentation

## 2018-10-09 DIAGNOSIS — Z953 Presence of xenogenic heart valve: Secondary | ICD-10-CM | POA: Insufficient documentation

## 2018-10-09 DIAGNOSIS — F1010 Alcohol abuse, uncomplicated: Secondary | ICD-10-CM | POA: Insufficient documentation

## 2018-10-09 DIAGNOSIS — I2721 Secondary pulmonary arterial hypertension: Secondary | ICD-10-CM | POA: Insufficient documentation

## 2018-10-09 DIAGNOSIS — I5022 Chronic systolic (congestive) heart failure: Secondary | ICD-10-CM | POA: Insufficient documentation

## 2018-10-09 DIAGNOSIS — I447 Left bundle-branch block, unspecified: Secondary | ICD-10-CM | POA: Insufficient documentation

## 2018-10-09 DIAGNOSIS — I471 Supraventricular tachycardia: Secondary | ICD-10-CM | POA: Insufficient documentation

## 2018-10-09 DIAGNOSIS — Z8249 Family history of ischemic heart disease and other diseases of the circulatory system: Secondary | ICD-10-CM | POA: Insufficient documentation

## 2018-10-09 DIAGNOSIS — I428 Other cardiomyopathies: Secondary | ICD-10-CM | POA: Insufficient documentation

## 2018-10-09 DIAGNOSIS — Z833 Family history of diabetes mellitus: Secondary | ICD-10-CM | POA: Insufficient documentation

## 2018-10-09 DIAGNOSIS — I2510 Atherosclerotic heart disease of native coronary artery without angina pectoris: Secondary | ICD-10-CM | POA: Insufficient documentation

## 2018-10-09 DIAGNOSIS — Z79899 Other long term (current) drug therapy: Secondary | ICD-10-CM | POA: Insufficient documentation

## 2018-10-09 LAB — BASIC METABOLIC PANEL
Anion gap: 10 (ref 5–15)
BUN: 16 mg/dL (ref 6–20)
CO2: 24 mmol/L (ref 22–32)
Calcium: 9.2 mg/dL (ref 8.9–10.3)
Chloride: 103 mmol/L (ref 98–111)
Creatinine, Ser: 1.05 mg/dL (ref 0.61–1.24)
GFR calc Af Amer: >60 mL/min (ref >60)
GFR calc non Af Amer: >60 mL/min (ref >60)
Glucose, Bld: 126 mg/dL — ABNORMAL HIGH (ref 70–99)
Potassium: 4.2 mmol/L (ref 3.5–5.1)
Sodium: 137 mmol/L (ref 135–145)

## 2018-10-09 MED ORDER — ENTRESTO 49-51 MG PO TABS: 1.0000 | ORAL_TABLET | Freq: Two times a day (BID) | ORAL | 11 refills | Status: DC

## 2018-10-09 MED ORDER — METOPROLOL SUCCINATE ER 50 MG PO TB24: 50.0000 mg | ORAL_TABLET | Freq: Two times a day (BID) | ORAL | 6 refills | Status: DC

## 2018-10-09 NOTE — Patient Instructions (Signed)
Stop Torsemide  Stop Amiodarone  Increase Entresto to 49/51 mg Twice daily   Increase Metoprolol to 50 mg Twice daily   Labs done today  Labs in 10 days  Your physician recommends that you schedule a follow-up appointment in: 3 months  At the [facility], you and your health needs are our priority. As part of our continuing mission to provide you with exceptional heart care, we have created designated Provider Care Teams. These Care Teams include your primary Cardiologist (physician) and Advanced Practice Providers (APPs- Physician Assistants and Nurse Practitioners) who all work together to provide you with the care you need, when you need it.   You may see any of the following providers on your designated Care Team at your next follow up: Marland Kitchen Dr Glori Bickers . Dr Loralie Champagne . Darrick Grinder, NP   Please be sure to bring in all your medications bottles to every appointment.

## 2018-10-09 NOTE — Progress Notes (Signed)
  Echocardiogram 2D Echocardiogram has been performed.  Tyler Berger 10/09/2018, 9:00 AM

## 2018-10-09 NOTE — Progress Notes (Signed)
PCP: Unk Pinto, MD HF Cardiology: Dr Aundra Dubin   HPI: 58 y.o. with history of cardiomyopathy and aortic stenosis returns for followup in CHF.  In 2004, he was diagnosed with nonischemic cardiomyopathy, EF 20% at that time.  His EF recovered back up to 60-65%, but he was noted to have moderate aortic stenosis with a bicuspid valve. He was not seen by cardiology for a number of years.   He was in the ER in 1/20 with SVT that converted spontaneously.  He was set up for an echo and sent back to cardiology.  Echo showed EF 25-30%, diffuse hypokinesis, probably severe aortic stenosis with bicuspid aortic valve.  He was referred for RHC/LHC.    RHC/LHC was done 2/20.  He had moderate coronary disease but no critical stenoses and no disease to explain his cardiomyopathy.  RHC showed markedly elevated PCWP, mixed pulmonary venous/pulmonary arterial hypertension, and low CI at 1.7.  I decided to admit him for diuresis, optimization of meds, and further workup of aortic stenosis.  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  He was evaluated by the structural heart team but valve area thought to be too big for the existing TAVR valves.  Therefore, surgical AVR was recommended.   On 04/10/18 he underwent bioprosthetic AVR with supracoronary ascending aorta replacement  by Dr Roxy Manns. He was placed on amiodarone due to increased PVCs post-op. Discharge weight was 224 pounds.  He had an echo done today which I reviewed, EF 40-45% with mild LVH, septal-lateral dyssynchrony, normal bioprosthetic aortic valve.    He has been doing well.  Walking 2 miles/day.  Weight is stable.  No dyspnea walking on flat ground, he does get short of breath walking up hills.  No orthopnea/PND.  No chest pain.  He is not smoking.  He rarely drinks ETOH.   ECG (7/20, personally reviewed): NSR, LBBB 150 msec  Labs (8/20): LDL 69, HDL 49, K 3.8, creatinine 1.22   PMH: 1. SVT 2. HTN 3. Prior ETOH abuse 4. Prior  smoking 5. Chronic systolic CHF: Nonischemic cardiomyopathy.  - Echo (2004) with EF 20%.  - Echo (1/20): EF 25-30%, mild LVH, moderate LV dilation, bicuspid aortic valve with severe AS.  - RHC (2/20): mean RA 12, PA 70/32 mean 47, mean PCWP 30, CI 1.72, PVR 4.2.  - Echo (8/20): EF 40-45%, mild LVH, septal-lateral dyssynchrony, normal RV size and systolic function, bioprosthetic AVR functions normally.  6. LBBB 7. CAD: LHC (2/20) witih 80% stenosis small D1, 50% stenosis moderate D2, 50% mid LCx, 50% distal RCA.  8. Biscuspid aortic valve disorder: Severe AS and dilated ascending aorta.   - 3/20 bioprosthetic AVR + supracoronary ascending aorta replacement.   ROS: All systems negative except as listed in HPI, PMH and Problem List.  SH:  Social History   Socioeconomic History  . Marital status: Married    Spouse name: Not on file  . Number of children: 2  . Years of education: Not on file  . Highest education level: Not on file  Occupational History  . 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 Financial resource strain: Not on file  . Food insecurity    Worry: Not on file    Inability: Not on file  . Transportation needs    Medical: Not on file    Non-medical: Not on file  Tobacco Use  . Smoking status: Former Smoker    Years: 30.00    Types: Cigarettes  Quit date: 03/28/2018    Years since quitting: 0.5  . Smokeless tobacco: Never Used  Substance and Sexual Activity  . Alcohol use: Not Currently  . Drug use: Never  . Sexual activity: Not on file  Lifestyle  . Physical activity    Days per week: Not on file    Minutes per session: Not on file  . Stress: Not on file  Relationships  . Social Herbalist on phone: Not on file    Gets together: Not on file    Attends religious service: Not on file    Active member of club or organization: Not on file    Attends meetings of clubs or organizations: Not on file    Relationship status: Not  on file  . Intimate partner violence    Fear of current or ex partner: Not on file    Emotionally abused: Not on file    Physically abused: Not on file    Forced sexual activity: Not on file  Other Topics Concern  . Not on file  Social History Narrative   Married, med Designer, multimedia.  Two children.      FH:  Family History  Problem Relation Age of Onset  . Diabetes Mother   . Hypertension Mother   . Cancer Mother        breast  . Alzheimer's disease Mother   . Hypertension Father   . Cancer Father        head and neck  . Hypertension Sister      Current Outpatient Medications  Medication Sig Dispense Refill  . acetaminophen (TYLENOL) 500 MG tablet Take 1,000 mg by mouth every 6 (six) hours as needed for mild pain or headache.    . ALPRAZolam (XANAX) 0.5 MG tablet TAKE 1/2 TO 1 (ONE-HALF TO ONE) TABLET BY MOUTH AT BEDTIME ONLY  IF  NEEDED  FOR  SLEEP  AND  LIMIT  TO  5  DAYS/WEEK 30 tablet 0  . aspirin EC 325 MG EC tablet Take 1 tablet (325 mg total) by mouth daily. 30 tablet 0  . busPIRone (BUSPAR) 10 MG tablet Take 1 tablet 3 x /day for Anxiety & Mood 270 tablet 1  . Cholecalciferol (VITAMIN D3) 125 MCG (5000 UT) TABS Take 20,000 Units by mouth daily.     . metoprolol succinate (TOPROL-XL) 50 MG 24 hr tablet Take 1 tablet (50 mg total) by mouth 2 (two) times daily. Take with or immediately following a meal. 60 tablet 6  . rosuvastatin (CRESTOR) 10 MG tablet Take 1 tablet (10 mg total) by mouth daily at 6 PM. 30 tablet 3  . sertraline (ZOLOFT) 100 MG tablet Take 1 tablet (100 mg total) by mouth daily. 30 tablet 0  . spironolactone (ALDACTONE) 25 MG tablet Take 1 tablet (25 mg total) by mouth daily. Please cancel all previous orders for current medication. Change in dosage or pill size. 30 tablet 6  . sacubitril-valsartan (ENTRESTO) 49-51 MG Take 1 tablet by mouth 2 (two) times daily. 60 tablet 11   No current facility-administered medications for this encounter.     Vitals:    10/09/18 0905  BP: 122/90  Pulse: 65  SpO2: 94%  Weight: 110.9 kg (244 lb 6.4 oz)   Wt Readings from Last 3 Encounters:  10/09/18 110.9 kg (244 lb 6.4 oz)  09/25/18 115.2 kg (254 lb)  08/14/18 109.8 kg (242 lb)     PHYSICAL EXAM: General: NAD Neck: No JVD,  no thyromegaly or thyroid nodule.  Lungs: Clear to auscultation bilaterally with normal respiratory effort. CV: Nondisplaced PMI.  Heart regular S1/S2, no S3/S4, 2/6 early SEM RUSB.  No peripheral edema.  No carotid bruit.  Normal pedal pulses.  Abdomen: Soft, nontender, no hepatosplenomegaly, no distention.  Skin: Intact without lesions or rashes.  Neurologic: Alert and oriented x 3.  Psych: Normal affect. Extremities: No clubbing or cyanosis.  HEENT: Normal.   ASSESSMENT & PLAN: 1. Chronic systolic CHF: Nonischemic cardiomyopathy. This pre-existed severe AS.  I reviewed today's echo, EF up to 40-45%.  He is not volume overloaded on exam.  - Increase Entresto to 49/51 bid.  BMET today and again in 10 days.  - Increase Toprol XL to 50 mg bid.  - Continue spironolactone 25 mg daily.  - He can use torsemide prn (rather than scheduling qod).  - EF is out of ICD range.  2. Bicuspid aortic valve disorder: Severe AS, now s/p surgical AVR (annulus too large for TAVR) and ascending aorta replacement.  Echo today showed a normally functioning bioprosthetic aortic valve.  - He will need endocarditis prophylaxis with dental work.  3. ETOH abuse: Rarely drinks alcohol now.  4. PVCs: Placed on amiodarone for PVCs after AVR.  - I think that he can stop amiodarone at this point.  5. CAD: Moderate nonobstructive CAD on 2/20 cath.  No chest pain.  I do not think that this contributed to his cardiomyopathy appreciably.  - Good lipids on Crestor.  - Continue ASA.   6. LBBB: Chronic.   Follow up in 3 months   Loralie Champagne 10/09/2018

## 2018-10-16 MED FILL — ROSUVASTATIN CALCIUM 10 MG: 10 | 30 days supply | Qty: 30 | Fill #1

## 2018-10-19 ENCOUNTER — Other Ambulatory Visit: Payer: Self-pay

## 2018-10-19 ENCOUNTER — Ambulatory Visit (HOSPITAL_COMMUNITY)
Admission: RE | Admit: 2018-10-19 | Discharge: 2018-10-19 | Disposition: A | Discharge status: Home / Self Care | Payer: Self-pay | Source: Ambulatory Visit | Attending: Cardiology | Admitting: Cardiology

## 2018-10-19 DIAGNOSIS — I5042 Chronic combined systolic (congestive) and diastolic (congestive) heart failure: Secondary | ICD-10-CM | POA: Insufficient documentation

## 2018-10-19 LAB — BASIC METABOLIC PANEL
Anion gap: 10 (ref 5–15)
BUN: 15 mg/dL (ref 6–20)
CO2: 24 mmol/L (ref 22–32)
Calcium: 9.5 mg/dL (ref 8.9–10.3)
Chloride: 105 mmol/L (ref 98–111)
Creatinine, Ser: 1.08 mg/dL (ref 0.61–1.24)
GFR calc Af Amer: >60 mL/min (ref >60)
GFR calc non Af Amer: >60 mL/min (ref >60)
Glucose, Bld: 113 mg/dL — ABNORMAL HIGH (ref 70–99)
Potassium: 4.2 mmol/L (ref 3.5–5.1)
Sodium: 139 mmol/L (ref 135–145)

## 2018-10-23 ENCOUNTER — Other Ambulatory Visit: Payer: Self-pay | Admitting: Physician Assistant

## 2018-10-23 DIAGNOSIS — F419 Anxiety disorder, unspecified: Secondary | ICD-10-CM

## 2018-11-16 MED FILL — ROSUVASTATIN CALCIUM 10 MG: 10 | 30 days supply | Qty: 30 | Fill #2

## 2018-12-03 NOTE — Progress Notes (Deleted)
Patient ID: GELACIO TANNOUS, male   DOB: [birth year], 59 y.o.   MRN: [id number] COMPLETE PHYSICAL  Assessment and Plan:  Encounter for general adult medical examination with abnormal findings Patient does not have insurance- will do minimal labs Is due for colonoscopy as well, no symptoms at this time.   Essential hypertension Not at goal with CHF Taking BP at home 130-150's at home Will message heart failure clinic to see if they are okay with increasing toprol to 50mg  BID Declines kidney labs today, states will get at heart failure clinic  Hyperlipidemia, unspecified hyperlipidemia type -     Lipid panel check lipids decrease fatty foods increase activity.   Chronic combined systolic and diastolic congestive heart failure [facility]) May try to maximize medications/blood pressure Will get repeat echo 09/01  LONG discussion about sodium intact/sugar intact and maximizing medications to help his heart not work as hard.  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  Goal weight is likely closer to 235-240 Weight daily and bring log of weight and BP to heart failure clinic  SVT (supraventricular tachycardia) (HCC) NSR at this time, hopeful that he is able to get off the amiodarone pending his next evaluation with heart failure clinic  Ascending aortic aneurysm [facility]) S/p repair  Tobacco abuse Quit smoking, congratulated the patient and encouraged him to continue.   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 Further disposition pending results of labs. Future Appointments  Date Time Provider [place]  12/04/2018  3:45 PM Vicie Mutters, PA-C [facility] None  01/10/2019  9:40 AM Larey Dresser, MD [facility] None  04/01/2019  8:45 AM Vicie Mutters, PA-C [facility] None  10/01/2019  3:00 PM Vicie Mutters, PA-C [facility] None    HPI 58 y.o. male  presents for CPE and 3 month follow up with hypertension, hyperlipidemia,  and vitamin D.  Patient is s/p aortic valve replacement with biosynthetic valve and thoracic aortic aneurysm repair on [id number], has systolic heart failure with post op TEE showing EF 35-40%, however at follow up Echo in Sept his EF was up to 40-45% and his amiodarone was stopped at his last heart failure visit with Dr. Aundra Dubin.    He is on entresto getting free from [place] toprol 25mg  BID switched to Coreg 25 mg BID.  aldactone 25mg  daily  demadex 20 mg AS needed.  He is walking his neighborhood without any issues, walks 30-35 mins daily. He has quit smoking. No fatigue in the AM, no snoring, no symptoms of sleep apnea.  BMI is There is no height or weight on file to calculate BMI., he is working on diet and exercise. Wt Readings from Last 3 Encounters:  10/09/18 244 lb 6.4  oz (110.9 kg)  09/25/18 254 lb (115.2 kg)  08/14/18 242 lb (109.8 kg)    He has had some depression, will take 1/2 of xanax before bed.  Lab Results  Component Value Date   GFRNONAA >60 10/19/2018   Lab Results  Component Value Date   CREATININE 1.08 10/19/2018   BUN 15 10/19/2018   NA 139 10/19/2018   K 4.2 10/19/2018   CL 105 10/19/2018   CO2 24 10/19/2018    His blood pressure has been controlled at home,, today their BP is   He is on cholesterol medication, crestor 10 and denies myalgias. His cholesterol is not at goal less than 70. The cholesterol last visit was:   Lab Results  Component Value Date   CHOL 141 09/25/2018   HDL 49 09/25/2018   LDLCALC 69 09/25/2018   TRIG 146 09/25/2018   CHOLHDL 2.9 09/25/2018   Last A1C in the office was,:  Lab Results  Component Value Date   HGBA1C 6.0 (H) 09/25/2018   Patient is on Vitamin D supplement.   Lab Results  Component Value Date   VD25OH 40 04/22/2013     Current Medications:    Current Outpatient Medications (Cardiovascular):  .  metoprolol succinate (TOPROL-XL) 50 MG 24 hr tablet, Take 1 tablet (50 mg total) by mouth 2 (two) times daily. Take with or immediately following a meal. .  rosuvastatin (CRESTOR) 10 MG tablet, Take 1 tablet (10 mg total) by mouth daily at 6 PM. .  sacubitril-valsartan (ENTRESTO) 49-51 MG, Take 1 tablet by mouth 2 (two) times daily. Marland Kitchen  spironolactone (ALDACTONE) 25 MG tablet, Take 1 tablet (25 mg total) by mouth daily. Please cancel all previous orders for current medication. Change in dosage or pill size.   Current Outpatient Medications (Analgesics):  .  acetaminophen (TYLENOL) 500 MG tablet, Take 1,000 mg by mouth every 6 (six) hours as needed for mild pain or headache. Marland Kitchen  aspirin EC 325 MG EC tablet, Take 1 tablet (325 mg total) by mouth daily.   Current Outpatient Medications (Other):  Marland Kitchen  ALPRAZolam (XANAX) 0.5 MG tablet, Take 1/2 to 1 tablet at Bedtime (ONLY if needed for Sleep & limit to 5 days /week to avoid Addiction) .  busPIRone (BUSPAR) 10 MG tablet, Take 1 tablet 3 x /day for Anxiety & Mood .  Cholecalciferol (VITAMIN D3) 125 MCG (5000 UT) TABS, Take 20,000 Units by mouth daily.  .  sertraline (ZOLOFT) 100 MG tablet, Take 1 tablet (100 mg total) by mouth daily.  Medical History:  Past Medical History:  Diagnosis Date  . Anxiety   .  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 Aortic stenosis   . Ascending aortic aneurysm ([place])   . CAD (coronary artery disease)   . Chronic combined systolic and diastolic congestive heart failure ([place])   . Dilated aortic root ([place])   . Hypertension   . Non-ischemic cardiomyopathy ([place])   . Nonischemic cardiomyopathy ([place])   . S/P aortic valve replacement with bioprosthetic valve 04/10/2018   29 mm Edwards Inspiris Resilia stented bovine pericardial tissue valve  . S/P ascending aortic aneurysm repair 04/10/2018   26 mm Hemashield Platinum supracoronary straight graft  . SVT (supraventricular tachycardia) ([place])   . Viral myocarditis 2004  . Vitamin D deficiency    Allergies No Known Allergies  Immunization History  Administered Date(s) Administered  . Influenza-Unspecified 12/05/2016  . Pneumococcal-Unspecified 02/08/1992  . Td 02/07/1997  . Tdap 06/01/2017   Colonoscopy:  will consider it CXR 2008 Echo 2008 Stress test 2008  SURGICAL HISTORY He  has a past surgical history that includes Hernia repair (Left, 2010); Orchiectomy (Left, 1982); Varicose vein surgery (Left, 05/1998); RIGHT HEART CATH AND CORONARY ANGIOGRAPHY (N/A, 03/28/2018); Multiple extractions with alveoloplasty (N/A, 04/05/2018); Ascending aortic root replacement (N/A, 04/10/2018); TEE without cardioversion (N/A, 04/10/2018); and Aortic valve replacement (N/A, 04/10/2018). FAMILY HISTORY His family history includes Alzheimer's disease in his mother; Cancer in his father and mother; Diabetes in his mother; Hypertension in his father, mother, and sister. SOCIAL HISTORY He  reports that he quit smoking about 8 months ago. His smoking use included cigarettes. He quit after 30.00 years of use. He has never used smokeless tobacco. He reports previous alcohol use. He reports that he does not use drugs.   Review of Systems  Constitutional: Negative.   HENT: Negative.   Eyes: Negative.   Respiratory: Negative.   Cardiovascular: Negative.    Gastrointestinal: Negative.   Genitourinary: Negative.   Musculoskeletal: Negative.   Skin: Negative.   Neurological: Negative.   Endo/Heme/Allergies: Negative.   Psychiatric/Behavioral: Negative.    Physical Exam: There were no vitals taken for this visit. Wt Readings from Last 3 Encounters:  10/09/18 244 lb 6.4 oz (110.9 kg)  09/25/18 254 lb (115.2 kg)  08/14/18 242 lb (109.8 kg)  There is no height or weight on file to calculate BMI.  General Appearance: Well nourished, in no apparent distress. Eyes: PERRLA, EOMs, conjunctiva no swelling or erythema Sinuses: No Frontal/maxillary tenderness ENT/Mouth: Ext aud canals clear, TMs without erythema, bulging. No erythema, swelling, or exudate on post pharynx.  Tonsils not swollen or erythematous. Hearing normal.  Neck: Supple, thyroid normal.  Respiratory: Respiratory effort normal, BS equal bilaterally without rales, rhonchi, wheezing or stridor.  Cardio: Regular rate and rhythm, no murmurs, rubs or gallops.  Brisk peripheral pulses with mild bilateral edema, left leg worse than right and large torturous varicose veins bilateral legs with venous stasis Abdomen: Soft, + BS.  Non tender, no guarding, rebound, hernias, masses. Lymphatics: Non tender without lymphadenopathy.  Musculoskeletal: Full ROM, 5/5 strength, normal gait.  Skin: Warm, dry without rashes, lesions, ecchymosis.  Neuro: Cranial nerves intact. Normal muscle tone, no cerebellar symptoms.  Psych: Awake and oriented X 3, normal affect, Insight and Judgment appropriate.    Vicie Mutters 10:09 AM

## 2018-12-04 ENCOUNTER — Ambulatory Visit: Payer: Self-pay | Admitting: Physician Assistant

## 2018-12-04 ENCOUNTER — Ambulatory Visit: Payer: Self-pay | Admitting: Adult Health

## 2018-12-05 NOTE — Progress Notes (Deleted)
Patient ID: Tyler Berger, male   DOB: 1960-07-02, 58 y.o.   MRN: [id number] COMPLETE PHYSICAL  Assessment and Plan:  Encounter for general adult medical examination with abnormal findings Patient does not have insurance- will do minimal labs Is due for colonoscopy as well, no symptoms at this time.   Essential hypertension Not at goal with CHF Taking BP at home 130-150's at home Will message heart failure clinic to see if they are okay with increasing toprol to 50mg  BID Declines kidney labs today, states will get at heart failure clinic  Hyperlipidemia, unspecified hyperlipidemia type -     Lipid panel check lipids decrease fatty foods increase activity.   Chronic combined systolic and diastolic congestive heart failure [facility]) May try to maximize medications/blood pressure Will get repeat echo 09/01  LONG discussion about sodium intact/sugar intact and maximizing medications to help his heart not work as hard.  Goal weight is likely closer to 235-240 Weight daily and bring log of weight and BP to heart failure clinic  Dilated aortic root [facility]) S/p repair  Non-ischemic cardiomyopathy [facility]) May try to maximize medications/blood pressure Will get repeat echo 09/01  LONG discussion about sodium intact/sugar intact and maximizing medications to help his heart not work as hard.  Goal weight is likely closer to 235-240 Weight daily and bring log of weight and BP to heart failure clinic  SVT (supraventricular tachycardia) (HCC) NSR at this time, hopeful that he is able to get off the amiodarone pending his next evaluation with heart failure clinic  Ascending aortic aneurysm [facility]) S/p repair  Tobacco abuse Quit smoking, congratulated the patient and encouraged him to continue.   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 Further disposition pending results of labs. Future Appointments  Date Time Provider [place]  12/06/2018  4:00 PM Vicie Mutters, [place] [facility] None  01/10/2019  9:40 AM Larey Dresser, MD [facility] None  04/01/2019  8:45 AM Vicie Mutters, PA-C [facility] None  10/01/2019  3:00 PM Vicie Mutters, PA-C [facility] None    HPI 58 y.o. male  presents for CPE and 3 month follow up with hypertension, hyperlipidemia,  and vitamin D.  Patient is s/p aortic valve replacement with biosynthetic valve and thoracic aortic aneurysm repair on [id number], has systolic heart failure with post op TEE showing EF 35-40%, however at follow up Echo in Sept his EF was up to 40-45% and his amiodarone was stopped at his last heart failure visit with Dr. Aundra Dubin.    He is on entresto getting free from  toprol 25mg  BID switched to Coreg 25 mg BID.  aldactone 25mg  daily  demadex 20 mg AS needed.  He is walking his neighborhood without any issues, walks 30-35 mins daily. He has quit smoking. No fatigue in the AM, no snoring, no symptoms of sleep apnea.  BMI is There is no height or weight on file to calculate BMI., he is working on diet and exercise. Wt Readings from Last 3 Encounters:  10/09/18 244 lb 6.4  oz (110.9 kg)  09/25/18 254 lb (115.2 kg)  08/14/18 242 lb (109.8 kg)    He has had some depression, will take 1/2 of xanax before bed.  Lab Results  Component Value Date   GFRNONAA >60 10/19/2018   Lab Results  Component Value Date   CREATININE 1.08 10/19/2018   BUN 15 10/19/2018   NA 139 10/19/2018   K 4.2 10/19/2018   CL 105 10/19/2018   CO2 24 10/19/2018    His blood pressure has been controlled at home,, today their BP is   He is on cholesterol medication, crestor 10 and denies myalgias. His cholesterol is not at goal less than 70. The cholesterol last visit was:   Lab Results  Component Value Date   CHOL 141 09/25/2018   HDL 49 09/25/2018   LDLCALC 69 09/25/2018   TRIG 146 09/25/2018   CHOLHDL 2.9 09/25/2018   Last A1C in the office was,:  Lab Results  Component Value Date   HGBA1C 6.0 (H) 09/25/2018   Patient is on Vitamin D supplement.   Lab Results  Component Value Date   VD25OH 40 04/22/2013     Current Medications:    Current Outpatient Medications (Cardiovascular):  .  metoprolol succinate (TOPROL-XL) 50 MG 24 hr tablet, Take 1 tablet (50 mg total) by mouth 2 (two) times daily. Take with or immediately following a meal. .  rosuvastatin (CRESTOR) 10 MG tablet, Take 1 tablet (10 mg total) by mouth daily at 6 PM. .  sacubitril-valsartan (ENTRESTO) 49-51 MG, Take 1 tablet by mouth 2 (two) times daily. Marland Kitchen  spironolactone (ALDACTONE) 25 MG tablet, Take 1 tablet (25 mg total) by mouth daily. Please cancel all previous orders for current medication. Change in dosage or pill size.   Current Outpatient Medications (Analgesics):  .  acetaminophen (TYLENOL) 500 MG tablet, Take 1,000 mg by mouth every 6 (six) hours as needed for mild pain or headache. Marland Kitchen  aspirin EC 325 MG EC tablet, Take 1 tablet (325 mg total) by mouth daily.   Current Outpatient Medications (Other):  Marland Kitchen  ALPRAZolam (XANAX) 0.5 MG tablet, Take 1/2 to 1 tablet at Bedtime (ONLY if needed for Sleep & limit to 5 days /week to avoid Addiction) .  busPIRone (BUSPAR) 10 MG tablet, Take 1 tablet 3 x /day for Anxiety & Mood .  Cholecalciferol (VITAMIN D3) 125 MCG (5000 UT) TABS, Take 20,000 Units by mouth daily.  .  sertraline (ZOLOFT) 100 MG tablet, Take 1 tablet (100 mg total) by mouth daily.  Medical History:  Past Medical History:  Diagnosis Date  . Anxiety   .  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 Aortic stenosis   . Ascending aortic aneurysm ([place])   . CAD (coronary artery disease)   . Chronic combined systolic and diastolic congestive heart failure ([place])   . Dilated aortic root ([place])   . Hypertension   . Non-ischemic cardiomyopathy ([place])   . Nonischemic cardiomyopathy ([place])   . S/P aortic valve replacement with bioprosthetic valve 04/10/2018   29 mm Edwards Inspiris Resilia stented bovine pericardial tissue valve  . S/P ascending aortic aneurysm repair 04/10/2018   26 mm Hemashield Platinum supracoronary straight graft  . SVT (supraventricular tachycardia) ([place])   . Viral myocarditis 2004  . Vitamin D deficiency    Allergies No Known Allergies  Immunization History  Administered Date(s) Administered  . Influenza Inj Mdck Quad Pf 12/05/2016  . Influenza,inj,Quad PF,6+ Mos 11/10/2017  . Influenza-Unspecified 12/05/2016, 11/28/2018  .  Pneumococcal-Unspecified 02/08/1992  . Td 02/07/1997  . Tdap 06/01/2017   Colonoscopy: will consider it CXR 2008 Echo 2008 Stress test 2008  SURGICAL HISTORY He  has a past surgical history that includes Hernia repair (Left, 2010); Orchiectomy (Left, 1982); Varicose vein surgery (Left, 05/1998); RIGHT HEART CATH AND CORONARY ANGIOGRAPHY (N/A, 03/28/2018); Multiple extractions with alveoloplasty (N/A, 04/05/2018); Ascending aortic root replacement (N/A, 04/10/2018); TEE without cardioversion (N/A, 04/10/2018); and Aortic valve replacement (N/A, 04/10/2018). FAMILY HISTORY His family history includes Alzheimer's disease in his mother; Cancer in his father and mother; Diabetes in his mother; Hypertension in his father, mother, and sister. SOCIAL HISTORY He  reports that he quit smoking about 8 months ago. His smoking use included cigarettes. He quit after 30.00 years of use. He has never used smokeless tobacco. He reports previous alcohol use. He reports that he does not use drugs.   Review of Systems  Constitutional:  Negative.   HENT: Negative.   Eyes: Negative.   Respiratory: Negative.   Cardiovascular: Negative.   Gastrointestinal: Negative.   Genitourinary: Negative.   Musculoskeletal: Negative.   Skin: Negative.   Neurological: Negative.   Endo/Heme/Allergies: Negative.   Psychiatric/Behavioral: Negative.    Physical Exam: There were no vitals taken for this visit. Wt Readings from Last 3 Encounters:  10/09/18 244 lb 6.4 oz (110.9 kg)  09/25/18 254 lb (115.2 kg)  08/14/18 242 lb (109.8 kg)  There is no height or weight on file to calculate BMI.  General Appearance: Well nourished, in no apparent distress. Eyes: PERRLA, EOMs, conjunctiva no swelling or erythema Sinuses: No Frontal/maxillary tenderness ENT/Mouth: Ext aud canals clear, TMs without erythema, bulging. No erythema, swelling, or exudate on post pharynx.  Tonsils not swollen or erythematous. Hearing normal.  Neck: Supple, thyroid normal.  Respiratory: Respiratory effort normal, BS equal bilaterally without rales, rhonchi, wheezing or stridor.  Cardio: Regular rate and rhythm, no murmurs, rubs or gallops.  Brisk peripheral pulses with mild bilateral edema, left leg worse than right and large torturous varicose veins bilateral legs with venous stasis Abdomen: Soft, + BS.  Non tender, no guarding, rebound, hernias, masses. Lymphatics: Non tender without lymphadenopathy.  Musculoskeletal: Full ROM, 5/5 strength, normal gait.  Skin: Warm, dry without rashes, lesions, ecchymosis.  Neuro: Cranial nerves intact. Normal muscle tone, no cerebellar symptoms.  Psych: Awake and oriented X 3, normal affect, Insight and Judgment appropriate.    Vicie Mutters 8:41 AM

## 2018-12-06 ENCOUNTER — Ambulatory Visit: Payer: Self-pay | Admitting: Physician Assistant

## 2018-12-20 MED FILL — ROSUVASTATIN CALCIUM 10 MG: 10 | 30 days supply | Qty: 30 | Fill #3

## 2019-01-08 ENCOUNTER — Other Ambulatory Visit: Payer: Self-pay | Admitting: Internal Medicine

## 2019-01-08 DIAGNOSIS — F419 Anxiety disorder, unspecified: Secondary | ICD-10-CM

## 2019-01-10 ENCOUNTER — Ambulatory Visit (HOSPITAL_COMMUNITY)
Admission: RE | Admit: 2019-01-10 | Discharge: 2019-01-10 | Disposition: A | Discharge status: Home / Self Care | Payer: Self-pay | Source: Ambulatory Visit | Attending: Cardiology | Admitting: Cardiology

## 2019-01-10 ENCOUNTER — Other Ambulatory Visit: Payer: Self-pay

## 2019-01-10 ENCOUNTER — Telehealth (HOSPITAL_COMMUNITY): Payer: Self-pay | Admitting: *Deleted

## 2019-01-10 VITALS — BP 90/60 | HR 74 | Wt 253.0 lb

## 2019-01-10 DIAGNOSIS — I493 Ventricular premature depolarization: Secondary | ICD-10-CM

## 2019-01-10 DIAGNOSIS — Z7982 Long term (current) use of aspirin: Secondary | ICD-10-CM | POA: Insufficient documentation

## 2019-01-10 DIAGNOSIS — Z87891 Personal history of nicotine dependence: Secondary | ICD-10-CM | POA: Insufficient documentation

## 2019-01-10 DIAGNOSIS — I2721 Secondary pulmonary arterial hypertension: Secondary | ICD-10-CM | POA: Insufficient documentation

## 2019-01-10 DIAGNOSIS — I447 Left bundle-branch block, unspecified: Secondary | ICD-10-CM | POA: Insufficient documentation

## 2019-01-10 DIAGNOSIS — I5042 Chronic combined systolic (congestive) and diastolic (congestive) heart failure: Secondary | ICD-10-CM

## 2019-01-10 DIAGNOSIS — Z953 Presence of xenogenic heart valve: Secondary | ICD-10-CM | POA: Insufficient documentation

## 2019-01-10 DIAGNOSIS — I5022 Chronic systolic (congestive) heart failure: Secondary | ICD-10-CM | POA: Insufficient documentation

## 2019-01-10 DIAGNOSIS — I428 Other cardiomyopathies: Secondary | ICD-10-CM | POA: Insufficient documentation

## 2019-01-10 DIAGNOSIS — Z803 Family history of malignant neoplasm of breast: Secondary | ICD-10-CM | POA: Insufficient documentation

## 2019-01-10 DIAGNOSIS — I110 Hypertensive heart disease with heart failure: Secondary | ICD-10-CM | POA: Insufficient documentation

## 2019-01-10 DIAGNOSIS — Q231 Congenital insufficiency of aortic valve: Secondary | ICD-10-CM | POA: Insufficient documentation

## 2019-01-10 DIAGNOSIS — Z952 Presence of prosthetic heart valve: Secondary | ICD-10-CM

## 2019-01-10 DIAGNOSIS — I2510 Atherosclerotic heart disease of native coronary artery without angina pectoris: Secondary | ICD-10-CM | POA: Insufficient documentation

## 2019-01-10 DIAGNOSIS — F1010 Alcohol abuse, uncomplicated: Secondary | ICD-10-CM | POA: Insufficient documentation

## 2019-01-10 DIAGNOSIS — Z79899 Other long term (current) drug therapy: Secondary | ICD-10-CM | POA: Insufficient documentation

## 2019-01-10 DIAGNOSIS — N644 Mastodynia: Secondary | ICD-10-CM | POA: Insufficient documentation

## 2019-01-10 DIAGNOSIS — Z8249 Family history of ischemic heart disease and other diseases of the circulatory system: Secondary | ICD-10-CM | POA: Insufficient documentation

## 2019-01-10 DIAGNOSIS — Z833 Family history of diabetes mellitus: Secondary | ICD-10-CM | POA: Insufficient documentation

## 2019-01-10 LAB — BASIC METABOLIC PANEL
Anion gap: 9 (ref 5–15)
BUN: 24 mg/dL — ABNORMAL HIGH (ref 6–20)
CO2: 23 mmol/L (ref 22–32)
Calcium: 9.3 mg/dL (ref 8.9–10.3)
Chloride: 106 mmol/L (ref 98–111)
Creatinine, Ser: 1.22 mg/dL (ref 0.61–1.24)
GFR calc Af Amer: >60 mL/min (ref >60)
GFR calc non Af Amer: >60 mL/min (ref >60)
Glucose, Bld: 142 mg/dL — ABNORMAL HIGH (ref 70–99)
Potassium: 4.7 mmol/L (ref 3.5–5.1)
Sodium: 138 mmol/L (ref 135–145)

## 2019-01-10 MED ORDER — EPLERENONE 25 MG PO TABS: 25.0000 mg | ORAL_TABLET | Freq: Every day | ORAL | 5 refills | Status: DC

## 2019-01-10 NOTE — Progress Notes (Signed)
ReDS Vest / Clip - 01/10/19 1000      ReDS Vest / Clip   Station Marker  C    Ruler Value  31    ReDS Value Range  (!) High volume overload    ReDS Actual Value  43    Anatomical Comments  sitting

## 2019-01-10 NOTE — Progress Notes (Signed)
PCP: Unk Pinto, MD HF Cardiology: Dr Aundra Dubin   HPI: 58 y.o. with history of cardiomyopathy and aortic stenosis returns for followup in CHF.  In 2004, he was diagnosed with nonischemic cardiomyopathy, EF 20% at that time.  His EF recovered back up to 60-65%, but he was noted to have moderate aortic stenosis with a bicuspid valve. He was not seen by cardiology for a number of years.   He was in the ER in 1/20 with SVT that converted spontaneously.  He was set up for an echo and sent back to cardiology.  Echo showed EF 25-30%, diffuse hypokinesis, probably severe aortic stenosis with bicuspid aortic valve.  He was referred for RHC/LHC.    RHC/LHC was done 2/20.  He had moderate coronary disease but no critical stenoses and no disease to explain his cardiomyopathy.  RHC showed markedly elevated PCWP, mixed pulmonary venous/pulmonary arterial hypertension, and low CI at 1.7.  I decided to admit him for diuresis, optimization of meds, and further workup of aortic stenosis.  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  He was evaluated by the structural heart team but valve area thought to be too big for the existing TAVR valves.  Therefore, surgical AVR was recommended.   On 04/10/18 he underwent bioprosthetic AVR with supracoronary ascending aorta replacement  by Dr Roxy Manns. He was placed on amiodarone due to increased PVCs post-op. Discharge weight was 224 pounds.  Echo in 8/20 showed EF 40-45% with mild LVH, septal-lateral dyssynchrony, normal bioprosthetic aortic valve.    He reports poor energy and fatigue.  He also has "dizzy spells" on occasion.  The "dizzy spells" seem to be associated with palpitations.  No syncope/presyncope.  He does not really get short of breath, able to walk through his neighborhood without problems.  He is not smoking.  He rarely drinks ETOH.  He has noted bilateral breast tenderness since starting spironolactone.  Weight up 9 lbs. BP 90/60 today but normally  has SBP in 110s range when he checks at home.   REDS clip: 43%  Labs (8/20): LDL 69, HDL 49, K 3.8, creatinine 1.22  Labs (9/20): K 4.2, creatinine 1.08  PMH: 1. SVT 2. HTN 3. Prior ETOH abuse 4. Prior smoking 5. Chronic systolic CHF: Nonischemic cardiomyopathy.  - Echo (2004) with EF 20%.  - Echo (1/20): EF 25-30%, mild LVH, moderate LV dilation, bicuspid aortic valve with severe AS.  - RHC (2/20): mean RA 12, PA 70/32 mean 47, mean PCWP 30, CI 1.72, PVR 4.2.  - Echo (8/20): EF 40-45%, mild LVH, septal-lateral dyssynchrony, normal RV size and systolic function, bioprosthetic AVR functions normally.  6. LBBB 7. CAD: LHC (2/20) witih 80% stenosis small D1, 50% stenosis moderate D2, 50% mid LCx, 50% distal RCA.  8. Biscuspid aortic valve disorder: Severe AS and dilated ascending aorta.   - 3/20 bioprosthetic AVR + supracoronary ascending aorta replacement.   ROS: All systems negative except as listed in HPI, PMH and Problem List.  SH:  Social History   Socioeconomic History  . Marital status: Married    Spouse name: Not on file  . Number of children: 2  . Years of education: Not on file  . Highest education level: Not on file  Occupational History  . 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 Financial resource strain: Not on file  . Food insecurity    Worry: Not on file    Inability: Not on file  . Transportation needs  Medical: Not on file    Non-medical: Not on file  Tobacco Use  . Smoking status: Former Smoker    Years: 30.00    Types: Cigarettes    Quit date: 03/28/2018    Years since quitting: 0.7  . Smokeless tobacco: Never Used  Substance and Sexual Activity  . Alcohol use: Not Currently  . Drug use: Never  . Sexual activity: Not on file  Lifestyle  . Physical activity    Days per week: Not on file    Minutes per session: Not on file  . Stress: Not on file  Relationships  . Social Herbalist on phone: Not on file     Gets together: Not on file    Attends religious service: Not on file    Active member of club or organization: Not on file    Attends meetings of clubs or organizations: Not on file    Relationship status: Not on file  . Intimate partner violence    Fear of current or ex partner: Not on file    Emotionally abused: Not on file    Physically abused: Not on file    Forced sexual activity: Not on file  Other Topics Concern  . Not on file  Social History Narrative   Married, med Designer, multimedia.  Two children.      FH:  Family History  Problem Relation Age of Onset  . Diabetes Mother   . Hypertension Mother   . Cancer Mother        breast  . Alzheimer's disease Mother   . Hypertension Father   . Cancer Father        head and neck  . Hypertension Sister      Current Outpatient Medications  Medication Sig Dispense Refill  . acetaminophen (TYLENOL) 500 MG tablet Take 1,000 mg by mouth every 6 (six) hours as needed for mild pain or headache.    . ALPRAZolam (XANAX) 0.5 MG tablet Take 1/2-1 tablet at Bedtime  ONLY if needed for Sleep &  limit to 5 days /week to avoid addiction 30 tablet 0  . aspirin EC 325 MG EC tablet Take 1 tablet (325 mg total) by mouth daily. 30 tablet 0  . busPIRone (BUSPAR) 10 MG tablet Take 1 tablet 3 x /day for Anxiety & Mood 270 tablet 1  . Cholecalciferol (VITAMIN D3) 125 MCG (5000 UT) TABS Take 20,000 Units by mouth daily.     . metoprolol succinate (TOPROL-XL) 50 MG 24 hr tablet Take 1 tablet (50 mg total) by mouth 2 (two) times daily. Take with or immediately following a meal. 60 tablet 6  . sacubitril-valsartan (ENTRESTO) 49-51 MG Take 1 tablet by mouth 2 (two) times daily. 60 tablet 11  . sertraline (ZOLOFT) 100 MG tablet Take 1 tablet (100 mg total) by mouth daily. 30 tablet 0  . eplerenone (INSPRA) 25 MG tablet Take 1 tablet (25 mg total) by mouth daily. 30 tablet 5   No current facility-administered medications for this encounter.     Vitals:   01/10/19  0946  BP: 90/60  Pulse: 74  SpO2: 98%  Weight: 114.8 kg (253 lb)   Wt Readings from Last 3 Encounters:  01/10/19 114.8 kg (253 lb)  10/09/18 110.9 kg (244 lb 6.4 oz)  09/25/18 115.2 kg (254 lb)     PHYSICAL EXAM: General: NAD Neck: JVP 8 cm with HJR, no thyromegaly or thyroid nodule.  Lungs: Clear to auscultation bilaterally with  normal respiratory effort. CV: Nondisplaced PMI.  Heart regular S1/S2, no S3/S4, 2/6 early SEM RUSB.  No peripheral edema.  No carotid bruit.  Normal pedal pulses.  Abdomen: Soft, nontender, no hepatosplenomegaly, no distention.  Skin: Intact without lesions or rashes.  Neurologic: Alert and oriented x 3.  Psych: Normal affect. Extremities: No clubbing or cyanosis.  HEENT: Normal.   ASSESSMENT & PLAN: 1. Chronic systolic CHF: Nonischemic cardiomyopathy. This pre-existed severe AS.  Echo in 9/20 showed EF up to 40-45%.  NYHA class II-III symptoms with fatigue more prominent than dyspnea.  He has mild volume overload by exam but REDS clip quite elevated at 43%. Weight is up 9 lbs.  - Continue Entresto 49/51 bid.   - Continue Toprol XL 50 mg bid.  - Breast pain is likely related to spironolactone.  I will stop spironolactone and start him on eplerenone 25 mg daily.  BMET today and again in 10 days.  - Start Lasix 40 mg daily x 3 days then decrease to 20 mg daily.  Start KCl 10 daily.  - EF is out of ICD range.  - I will refer for cardiac rehab (was unable to do rehab due to coronavirus).  2. Bicuspid aortic valve disorder: Severe AS, now s/p surgical AVR (annulus too large for TAVR) and ascending aorta replacement.  Echo in 9/20 showed a normally functioning bioprosthetic aortic valve.  - He will need endocarditis prophylaxis with dental work.  3. ETOH abuse: Rarely drinks alcohol now.  4. PVCs: Placed on amiodarone for PVCs after AVR, however this was stopped.  Recently, he has been having symptomatic palpitations associated with "dizziness," no syncope.  ?Frequent PVCs.  - I will arrange for 2 week Zio patch.  5. CAD: Moderate nonobstructive CAD on 2/20 cath.  No chest pain.  I do not think that this contributed to his cardiomyopathy appreciably.  - Good lipids on Crestor.  - Continue ASA.   6. LBBB: Chronic.   Follow up in 6 weeks   Loralie Champagne 01/10/2019

## 2019-01-10 NOTE — Telephone Encounter (Signed)
Left vm for pt to call back his clip reading was 43%. Per Dr.McLean pt needs to take lasix 40mg  for 3 days after that take lasix 20mg  daily. Also needs to start potassium 23meq daily.  Bmet in 10 days.

## 2019-01-10 NOTE — Progress Notes (Signed)
Zio patch placed onto patient.  All instructions and information reviewed with patient, they verbalize understanding with no questions. 

## 2019-01-10 NOTE — Patient Instructions (Signed)
Labs done today. We will contact you only if your labs are abnormal.  STOP Spironolactone  START Eplerenone 25mg (1 tab) once daily  You have been referred to Cardiac Rehab. They will contact you to schedule an appointment.  Your provider has recommended that  you wear a Zio Patch for 14 days.  This monitor will record your heart rhythm for our review.  IF you have any symptoms while wearing the monitor please press the button.  If you have any issues with the patch or you notice a red or orange light on it please call the company at [phone].  Once you remove the patch please mail it back to the company as soon as possible so we can get the results.  Your physician recommends that you schedule a follow-up appointment in: 6 weeks  At the [facility], you and your health needs are our priority. As part of our continuing mission to provide you with exceptional heart care, we have created designated Provider Care Teams. These Care Teams include your primary Cardiologist (physician) and Advanced Practice Providers (APPs- Physician Assistants and Nurse Practitioners) who all work together to provide you with the care you need, when you need it.   You may see any of the following providers on your designated Care Team at your next follow up: Marland Kitchen Dr Glori Bickers . Dr Loralie Champagne . Darrick Grinder, NP . Lyda Jester, PA . Audry Riles, PharmD   Please be sure to bring in all your medications bottles to every appointment.

## 2019-01-14 MED ORDER — FUROSEMIDE 20 MG PO TABS: ORAL_TABLET | ORAL | 3 refills | Status: DC

## 2019-01-14 MED ORDER — POTASSIUM CHLORIDE ER 10 MEQ PO TBCR: 10.0000 meq | EXTENDED_RELEASE_TABLET | Freq: Every day | ORAL | 3 refills | Status: DC

## 2019-01-14 NOTE — Telephone Encounter (Signed)
Pt returned call to office, discussed with him recommendations from last visit. Pt will start lasix today and potassium as directed.  Will rtc next week to repeat bmet. Verbalized understanding.

## 2019-01-14 NOTE — Addendum Note (Signed)
Addended by: Valeda Malm on: 01/14/2019 12:52 PM   Modules accepted: Orders

## 2019-01-18 ENCOUNTER — Other Ambulatory Visit (HOSPITAL_COMMUNITY): Payer: Self-pay | Admitting: Cardiology

## 2019-01-21 ENCOUNTER — Telehealth (HOSPITAL_COMMUNITY): Payer: Self-pay

## 2019-01-21 NOTE — Telephone Encounter (Signed)
Called and spoke with pt in regards to CR, pt stated he is waiting for his approval on Medicaid. Adv pt of our CR maintenance program and our VCR program, pt declined both.  Closed referral

## 2019-01-22 ENCOUNTER — Other Ambulatory Visit (HOSPITAL_COMMUNITY): Payer: Self-pay | Admitting: Cardiology

## 2019-01-23 ENCOUNTER — Ambulatory Visit (HOSPITAL_COMMUNITY)
Admission: RE | Admit: 2019-01-23 | Discharge: 2019-01-23 | Disposition: A | Discharge status: Home / Self Care | Payer: Self-pay | Source: Ambulatory Visit | Attending: Internal Medicine | Admitting: Internal Medicine

## 2019-01-23 ENCOUNTER — Other Ambulatory Visit (HOSPITAL_COMMUNITY): Payer: Self-pay

## 2019-01-23 ENCOUNTER — Other Ambulatory Visit: Payer: Self-pay

## 2019-01-23 DIAGNOSIS — I5042 Chronic combined systolic (congestive) and diastolic (congestive) heart failure: Secondary | ICD-10-CM | POA: Insufficient documentation

## 2019-01-23 LAB — BASIC METABOLIC PANEL
Anion gap: 12 (ref 5–15)
BUN: 22 mg/dL — ABNORMAL HIGH (ref 6–20)
CO2: 30 mmol/L (ref 22–32)
Calcium: 9.4 mg/dL (ref 8.9–10.3)
Chloride: 100 mmol/L (ref 98–111)
Creatinine, Ser: 1.22 mg/dL (ref 0.61–1.24)
GFR calc Af Amer: >60 mL/min (ref >60)
GFR calc non Af Amer: >60 mL/min (ref >60)
Glucose, Bld: 103 mg/dL — ABNORMAL HIGH (ref 70–99)
Potassium: 3.4 mmol/L — ABNORMAL LOW (ref 3.5–5.1)
Sodium: 142 mmol/L (ref 135–145)

## 2019-01-23 MED ORDER — ROSUVASTATIN CALCIUM 10 MG PO TABS: 10.0000 mg | ORAL_TABLET | Freq: Every day | ORAL | 5 refills | Status: DC

## 2019-01-23 MED FILL — ROSUVASTATIN CALCIUM 10 MG: 10 | 30 days supply | Qty: 30 | Fill #0

## 2019-01-23 NOTE — Telephone Encounter (Signed)
Pt in office this morning stating that he needs a refills for crestor however not on med list.  Called [facility] pharmacy, pt confirmed on medication.  MD note states patient is on med. Refill granted.

## 2019-02-04 ENCOUNTER — Telehealth (HOSPITAL_COMMUNITY): Payer: Self-pay

## 2019-02-04 DIAGNOSIS — I493 Ventricular premature depolarization: Secondary | ICD-10-CM

## 2019-02-04 MED ORDER — AMIODARONE HCL 200 MG PO TABS: 200.0000 mg | ORAL_TABLET | Freq: Two times a day (BID) | ORAL | 1 refills | Status: DC

## 2019-02-04 NOTE — Telephone Encounter (Signed)
-----   Message from Larey Dresser, MD sent at 02/03/2019  9:51 PM EST ----- Very frequent PVCs.  Would start amiodarone 200 mg bid and refer to EP for evaluation.  ?if ablation would be a consideration.

## 2019-02-04 NOTE — Telephone Encounter (Signed)
Pt aware of results and treatment plan.  Pt has medicine at home. Will call pharm when he needs refills. EP referral placed. Pt verbalized understanding.

## 2019-02-19 ENCOUNTER — Other Ambulatory Visit: Payer: Self-pay | Admitting: Internal Medicine

## 2019-02-19 DIAGNOSIS — F419 Anxiety disorder, unspecified: Secondary | ICD-10-CM

## 2019-02-25 MED FILL — ROSUVASTATIN CALCIUM 10 MG: 10 | 30 days supply | Qty: 30 | Fill #1

## 2019-02-28 ENCOUNTER — Encounter (HOSPITAL_COMMUNITY): Payer: Self-pay | Admitting: Cardiology

## 2019-03-01 ENCOUNTER — Encounter (HOSPITAL_COMMUNITY): Payer: Self-pay

## 2019-03-01 ENCOUNTER — Encounter (HOSPITAL_COMMUNITY): Payer: Self-pay | Admitting: Cardiology

## 2019-03-01 ENCOUNTER — Ambulatory Visit (HOSPITAL_COMMUNITY)
Admission: RE | Admit: 2019-03-01 | Discharge: 2019-03-01 | Disposition: A | Discharge status: Home / Self Care | Payer: Self-pay | Source: Ambulatory Visit | Attending: Cardiology | Admitting: Cardiology

## 2019-03-01 ENCOUNTER — Other Ambulatory Visit: Payer: Self-pay

## 2019-03-01 VITALS — BP 117/89 | HR 69 | Wt 254.8 lb

## 2019-03-01 DIAGNOSIS — N62 Hypertrophy of breast: Secondary | ICD-10-CM | POA: Insufficient documentation

## 2019-03-01 DIAGNOSIS — Z560 Unemployment, unspecified: Secondary | ICD-10-CM | POA: Insufficient documentation

## 2019-03-01 DIAGNOSIS — Z953 Presence of xenogenic heart valve: Secondary | ICD-10-CM | POA: Insufficient documentation

## 2019-03-01 DIAGNOSIS — I2510 Atherosclerotic heart disease of native coronary artery without angina pectoris: Secondary | ICD-10-CM | POA: Insufficient documentation

## 2019-03-01 DIAGNOSIS — I428 Other cardiomyopathies: Secondary | ICD-10-CM | POA: Insufficient documentation

## 2019-03-01 DIAGNOSIS — I493 Ventricular premature depolarization: Secondary | ICD-10-CM

## 2019-03-01 DIAGNOSIS — Z79899 Other long term (current) drug therapy: Secondary | ICD-10-CM | POA: Insufficient documentation

## 2019-03-01 DIAGNOSIS — I110 Hypertensive heart disease with heart failure: Secondary | ICD-10-CM | POA: Insufficient documentation

## 2019-03-01 DIAGNOSIS — Z8249 Family history of ischemic heart disease and other diseases of the circulatory system: Secondary | ICD-10-CM | POA: Insufficient documentation

## 2019-03-01 DIAGNOSIS — Z7982 Long term (current) use of aspirin: Secondary | ICD-10-CM | POA: Insufficient documentation

## 2019-03-01 DIAGNOSIS — Z87891 Personal history of nicotine dependence: Secondary | ICD-10-CM | POA: Insufficient documentation

## 2019-03-01 DIAGNOSIS — R0602 Shortness of breath: Secondary | ICD-10-CM | POA: Insufficient documentation

## 2019-03-01 DIAGNOSIS — I447 Left bundle-branch block, unspecified: Secondary | ICD-10-CM | POA: Insufficient documentation

## 2019-03-01 DIAGNOSIS — I2721 Secondary pulmonary arterial hypertension: Secondary | ICD-10-CM | POA: Insufficient documentation

## 2019-03-01 DIAGNOSIS — I5042 Chronic combined systolic (congestive) and diastolic (congestive) heart failure: Secondary | ICD-10-CM

## 2019-03-01 DIAGNOSIS — Q231 Congenital insufficiency of aortic valve: Secondary | ICD-10-CM | POA: Insufficient documentation

## 2019-03-01 DIAGNOSIS — I5022 Chronic systolic (congestive) heart failure: Secondary | ICD-10-CM | POA: Insufficient documentation

## 2019-03-01 LAB — COMPREHENSIVE METABOLIC PANEL
ALT: 28 U/L (ref 0–44)
AST: 25 U/L (ref 15–41)
Albumin: 4.2 g/dL (ref 3.5–5.0)
Alkaline Phosphatase: 47 U/L (ref 38–126)
Anion gap: 12 (ref 5–15)
BUN: 16 mg/dL (ref 6–20)
CO2: 24 mmol/L (ref 22–32)
Calcium: 9.6 mg/dL (ref 8.9–10.3)
Chloride: 104 mmol/L (ref 98–111)
Creatinine, Ser: 1.27 mg/dL — ABNORMAL HIGH (ref 0.61–1.24)
GFR calc Af Amer: >60 mL/min (ref >60)
GFR calc non Af Amer: >60 mL/min (ref >60)
Glucose, Bld: 151 mg/dL — ABNORMAL HIGH (ref 70–99)
Potassium: 3.9 mmol/L (ref 3.5–5.1)
Sodium: 140 mmol/L (ref 135–145)
Total Bilirubin: 0.8 mg/dL (ref 0.3–1.2)
Total Protein: 7.1 g/dL (ref 6.5–8.1)

## 2019-03-01 LAB — TSH: TSH: 1.498 u[IU]/mL (ref 0.350–4.500)

## 2019-03-01 MED ORDER — AMIODARONE HCL 200 MG PO TABS: 200.0000 mg | ORAL_TABLET | Freq: Every day | ORAL | 5 refills | Status: DC

## 2019-03-01 MED ORDER — EPLERENONE 50 MG PO TABS: 50.0000 mg | ORAL_TABLET | Freq: Every day | ORAL | 5 refills | Status: DC

## 2019-03-01 NOTE — Progress Notes (Signed)
Late entry, pt referred to EP. Message to sent to patient via mychart.

## 2019-03-01 NOTE — Progress Notes (Signed)
CSW asked to speak with pt regarding current lack of insurance.  CSW had spoken to patient last summer and he had been in the process of applying for Disability at that time and CSW had also directed him to call DSS about applying for Medicaid.  Pt reports he still has not heard a determination from Disability- they have had him see their physicians and have sent him more paperwork to fill out recently so his case is still in process. Pt provided permission for CSW to call DDS on his behalf to see if they need anything additional from [place]- pt agreeable.  CSW spoke with DDS representative who states they need no further documentation from our clinic at this time.  Pt is very stressed about current insurance status and feeling helpless that he can't pay for his medical bills.  CSW provided with CAFA for him to fill out and return ASAP- if approved this would help pay [facility] bills anywhere from 50-100% of the cost- would backpay 240day and pay moving forward for 6 months.    CSW will continue to follow and assist as needed  [name], [facility] Desk#: [phone] Cell#: [phone]

## 2019-03-01 NOTE — Patient Instructions (Addendum)
INCREASE Eplerenone to 50mg  (1 tab) daily   DECREASE Amiodarone to 200mg  (1 tab) daily   Labs today and repeat 2 weeks We will only contact you if something comes back abnormal or we need to make some changes. Otherwise no news is good news!   Your provider has recommended that  you wear a Zio Patch for 3 days.  This monitor will record your heart rhythm for our review.  IF you have any symptoms while wearing the monitor please press the button.  If you have any issues with the patch or you notice a red or orange light on it please call the company at [phone].  Once you remove the patch please mail it back to the company as soon as possible so we can get the results.    Your physician recommends that you schedule a follow-up appointment in: 3 months with Dr Aundra Dubin   Please call office at [phone] option 2 if you have any questions or concerns.    At the [facility], you and your health needs are our priority. As part of our continuing mission to provide you with exceptional heart care, we have created designated Provider Care Teams. These Care Teams include your primary Cardiologist (physician) and Advanced Practice Providers (APPs- Physician Assistants and Nurse Practitioners) who all work together to provide you with the care you need, when you need it.   You may see any of the following providers on your designated Care Team at your next follow up: Marland Kitchen Dr Glori Bickers . Dr Loralie Champagne . Darrick Grinder, NP . Lyda Jester, PA . Audry Riles, PharmD   Please be sure to bring in all your medications bottles to every appointment.

## 2019-03-02 NOTE — Progress Notes (Signed)
PCP: Unk Pinto, MD HF Cardiology: Dr Aundra Dubin   HPI: 59 y.o. with history of cardiomyopathy and aortic stenosis returns for followup in CHF.  In 2004, he was diagnosed with nonischemic cardiomyopathy, EF 20% at that time.  His EF recovered back up to 60-65%, but he was noted to have moderate aortic stenosis with a bicuspid valve. He was not seen by cardiology for a number of years.   He was in the ER in 1/20 with SVT that converted spontaneously.  He was set up for an echo and sent back to cardiology.  Echo showed EF 25-30%, diffuse hypokinesis, probably severe aortic stenosis with bicuspid aortic valve.  He was referred for RHC/LHC.    RHC/LHC was done 2/20.  He had moderate coronary disease but no critical stenoses and no disease to explain his cardiomyopathy.  RHC showed markedly elevated PCWP, mixed pulmonary venous/pulmonary arterial hypertension, and low CI at 1.7.  I decided to admit him for diuresis, optimization of meds, and further workup of aortic stenosis.  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  He was evaluated by the structural heart team but valve area thought to be too big for the existing TAVR valves.  Therefore, surgical AVR was recommended.   On 04/10/18 he underwent bioprosthetic AVR with supracoronary ascending aorta replacement  by Dr Roxy Manns. He was placed on amiodarone due to increased PVCs post-op. Discharge weight was 224 pounds.  Echo in 8/20 showed EF 40-45% with mild LVH, septal-lateral dyssynchrony, normal bioprosthetic aortic valve.  Zio patch in 12/20 showed 15.9% PVCs and NSVT runs.  Amiodarone was started.   The dizzy spells that he reported last admission are improved since starting amiodarone.  Breast pain has resolved since stopping spironolactone and switching to eplerenone.  He is under a lot of stress, has lost his job and insurance.  He is tired and short of breath after walking about 1/4 mile.  No orthopnea/PND.  No chest pain.  He has a  chronic tremor, this was present before starting amiodarone.  Weight is stable.    ECG (personally reviewed): NSR, LBBB 148 msec, PVC  Labs (8/20): LDL 69, HDL 49, K 3.8, creatinine 1.22  Labs (9/20): K 4.2, creatinine 1.08 Labs (12/20): K 3.4, creatinine 1.22  PMH: 1. SVT 2. HTN 3. Prior ETOH abuse 4. Prior smoking 5. Chronic systolic CHF: Nonischemic cardiomyopathy.  - Echo (2004) with EF 20%.  - Echo (1/20): EF 25-30%, mild LVH, moderate LV dilation, bicuspid aortic valve with severe AS.  - RHC (2/20): mean RA 12, PA 70/32 mean 47, mean PCWP 30, CI 1.72, PVR 4.2.  - Echo (8/20): EF 40-45%, mild LVH, septal-lateral dyssynchrony, normal RV size and systolic function, bioprosthetic AVR functions normally.  6. LBBB 7. CAD: LHC (2/20) witih 80% stenosis small D1, 50% stenosis moderate D2, 50% mid LCx, 50% distal RCA.  8. Biscuspid aortic valve disorder: Severe AS and dilated ascending aorta.   - 3/20 bioprosthetic AVR + supracoronary ascending aorta replacement.  9. PVCs: Zio patch 12/20 showed 15.9% PVCs, NSVT runs.   ROS: All systems negative except as listed in HPI, PMH and Problem List.  SH:  Social History   Socioeconomic History  . Marital status: Married    Spouse name: Not on file  . Number of children: 2  . Years of education: Not on file  . Highest education level: Not on file  Occupational History  . 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 Smoking  status: Former Smoker    Years: 30.00    Types: Cigarettes    Quit date: 03/28/2018    Years since quitting: 0.9  . Smokeless tobacco: Never Used  Substance and Sexual Activity  . Alcohol use: Not Currently  . Drug use: Never  . Sexual activity: Not on file  Other Topics Concern  . Not on file  Social History Narrative   Married, med Designer, multimedia.  Two children.     Social Determinants of Health   Financial Resource Strain:   . Difficulty of Paying Living Expenses: Not on file  Food  Insecurity:   . Worried About Charity fundraiser in the Last Year: Not on file  . Ran Out of Food in the Last Year: Not on file  Transportation Needs:   . Lack of Transportation (Medical): Not on file  . Lack of Transportation (Non-Medical): Not on file  Physical Activity:   . Days of Exercise per Week: Not on file  . Minutes of Exercise per Session: Not on file  Stress:   . Feeling of Stress : Not on file  Social Connections:   . Frequency of Communication with Friends and Family: Not on file  . Frequency of Social Gatherings with Friends and Family: Not on file  . Attends Religious Services: Not on file  . Active Member of Clubs or Organizations: Not on file  . Attends Archivist Meetings: Not on file  . Marital Status: Not on file  Intimate Partner Violence:   . Fear of Current or Ex-Partner: Not on file  . Emotionally Abused: Not on file  . Physically Abused: Not on file  . Sexually Abused: Not on file    FH:  Family History  Problem Relation Age of Onset  . Diabetes Mother   . Hypertension Mother   . Cancer Mother        breast  . Alzheimer's disease Mother   . Hypertension Father   . Cancer Father        head and neck  . Hypertension Sister      Current Outpatient Medications  Medication Sig Dispense Refill  . acetaminophen (TYLENOL) 500 MG tablet Take 1,000 mg by mouth every 6 (six) hours as needed for mild pain or headache.    . ALPRAZolam (XANAX) 0.5 MG tablet TAKE 1/2 TO 1 (ONE-HALF TO ONE) TABLET BY MOUTH AT BEDTIME AS NEEDED FOR SLEEP +  LIMIT  TO  5  DAYS  PER  WEEK  TO  AVOID  ADDICTION 30 tablet 0  . amiodarone (PACERONE) 200 MG tablet Take 1 tablet (200 mg total) by mouth daily. 30 tablet 5  . aspirin EC 325 MG EC tablet Take 1 tablet (325 mg total) by mouth daily. 30 tablet 0  . busPIRone (BUSPAR) 10 MG tablet Take 1 tablet 3 x /day for Anxiety & Mood 270 tablet 1  . Cholecalciferol (VITAMIN D3) 125 MCG (5000 UT) TABS Take 20,000 Units by  mouth daily.     Marland Kitchen eplerenone (INSPRA) 50 MG tablet Take 1 tablet (50 mg total) by mouth daily. 30 tablet 5  . furosemide (LASIX) 20 MG tablet Take 40mg  (2 tabs) for 3 days, then 20mg  (1 tab) daily after that 93 tablet 3  . metoprolol succinate (TOPROL-XL) 50 MG 24 hr tablet Take 1 tablet (50 mg total) by mouth 2 (two) times daily. Take with or immediately following a meal. 60 tablet 6  . potassium chloride (KLOR-CON) 10 MEQ  tablet Take 1 tablet (10 mEq total) by mouth daily. 90 tablet 3  . rosuvastatin (CRESTOR) 10 MG tablet Take 1 tablet (10 mg total) by mouth daily. 30 tablet 5  . sacubitril-valsartan (ENTRESTO) 49-51 MG Take 1 tablet by mouth 2 (two) times daily. 60 tablet 11  . sertraline (ZOLOFT) 100 MG tablet Take 1 tablet (100 mg total) by mouth daily. 30 tablet 0   No current facility-administered medications for this encounter.    Vitals:   03/01/19 0906  BP: 117/89  Pulse: 69  SpO2: 98%  Weight: 115.6 kg (254 lb 12.8 oz)   Wt Readings from Last 3 Encounters:  03/01/19 115.6 kg (254 lb 12.8 oz)  01/10/19 114.8 kg (253 lb)  10/09/18 110.9 kg (244 lb 6.4 oz)     PHYSICAL EXAM: General: NAD Neck: No JVD, no thyromegaly or thyroid nodule.  Lungs: Clear to auscultation bilaterally with normal respiratory effort. CV: Nondisplaced PMI.  Heart regular S1/S2, no S3/S4, 1/6 SEM RUSB.  Trace edema.  No carotid bruit.  Normal pedal pulses.  Abdomen: Soft, nontender, no hepatosplenomegaly, no distention.  Skin: Intact without lesions or rashes.  Neurologic: Alert and oriented x 3.  Psych: Normal affect. Extremities: No clubbing or cyanosis.  HEENT: Normal.   ASSESSMENT & PLAN: 1. Chronic systolic CHF: Nonischemic cardiomyopathy. This pre-existed severe AS.  Echo in 9/20 showed EF up to 40-45%.  He is not volume overloaded on exam, NYHA class II.  - Continue Entresto 49/51 bid.   - Continue Toprol XL 50 mg bid.  - Increase eplerenone to 50 mg daily (painful gynecomastia with  spironolactone, now resolved).   - Continue Lasix 20 mg daily.  Continue KCl 10 daily.  - EF is out of ICD range.  2. Bicuspid aortic valve disorder: Severe AS, now s/p surgical AVR (annulus too large for TAVR) and ascending aorta replacement.  Echo in 9/20 showed a normally functioning bioprosthetic aortic valve.  - He will need endocarditis prophylaxis with dental work.  3. ETOH abuse: Rarely drinks alcohol now.  4. PVCs: 15.9% PVCs on 12/20 Zio patch (symptomatic).  Amiodarone started, less symptomatic now.  - Decrease amiodarone to 200 mg daily.  Check LFTs and TSH, he will need regular eye exam.   Chronic tremor pre-existed amiodarone and has not worsened.  - Repeat 3 day Zio patch to see if PVCs have decreased.  5. CAD: Moderate nonobstructive CAD on 2/20 cath.  No chest pain.  I do not think that this contributed to his cardiomyopathy appreciably.  - Good lipids on Crestor.  - Continue ASA.   6. LBBB: Chronic.   Follow up in 3 months  Loralie Champagne 03/02/2019

## 2019-03-11 ENCOUNTER — Telehealth (HOSPITAL_COMMUNITY): Payer: Self-pay | Admitting: Licensed Clinical Social Worker

## 2019-03-11 NOTE — Telephone Encounter (Signed)
CSW called pt to check in regarding pt disability and medicaid applications- patient reports he still has not heard anything back- hopeful to hear something soon.  Pt does report he has almost completed his CAFA and plans to send it in for review this week- hopeful this will help with some of his [facility] Bills.  CSW will continue to follow and assist as needed  [name], [facility] Desk#: [phone] Cell#: [phone]

## 2019-03-15 ENCOUNTER — Other Ambulatory Visit: Payer: Self-pay

## 2019-03-15 ENCOUNTER — Ambulatory Visit (HOSPITAL_COMMUNITY)
Admission: RE | Admit: 2019-03-15 | Discharge: 2019-03-15 | Disposition: A | Discharge status: Home / Self Care | Payer: Self-pay | Source: Ambulatory Visit | Attending: Cardiology | Admitting: Cardiology

## 2019-03-15 DIAGNOSIS — I5042 Chronic combined systolic (congestive) and diastolic (congestive) heart failure: Secondary | ICD-10-CM | POA: Insufficient documentation

## 2019-03-15 LAB — BASIC METABOLIC PANEL
Anion gap: 10 (ref 5–15)
BUN: 21 mg/dL — ABNORMAL HIGH (ref 6–20)
CO2: 27 mmol/L (ref 22–32)
Calcium: 8.8 mg/dL — ABNORMAL LOW (ref 8.9–10.3)
Chloride: 102 mmol/L (ref 98–111)
Creatinine, Ser: 1.12 mg/dL (ref 0.61–1.24)
GFR calc Af Amer: >60 mL/min (ref >60)
GFR calc non Af Amer: >60 mL/min (ref >60)
Glucose, Bld: 143 mg/dL — ABNORMAL HIGH (ref 70–99)
Potassium: 4.2 mmol/L (ref 3.5–5.1)
Sodium: 139 mmol/L (ref 135–145)

## 2019-03-18 ENCOUNTER — Telehealth (HOSPITAL_COMMUNITY): Payer: Self-pay

## 2019-03-18 MED ORDER — METOPROLOL SUCCINATE ER 25 MG PO TB24: 75.0000 mg | ORAL_TABLET | Freq: Two times a day (BID) | ORAL | 5 refills | Status: DC

## 2019-03-18 NOTE — Telephone Encounter (Signed)
-----   Message from Larey Dresser, MD sent at 03/17/2019  9:56 PM EST ----- Frequent PVCs but not enough to be particularly worrisome.  Increase Toprol XL to 75 mg bid.

## 2019-03-19 ENCOUNTER — Encounter: Payer: Self-pay | Admitting: Cardiology

## 2019-03-19 ENCOUNTER — Telehealth (HOSPITAL_COMMUNITY): Payer: Self-pay | Admitting: Licensed Clinical Social Worker

## 2019-03-19 ENCOUNTER — Other Ambulatory Visit: Payer: Self-pay

## 2019-03-19 ENCOUNTER — Other Ambulatory Visit (HOSPITAL_COMMUNITY): Payer: Self-pay | Admitting: *Deleted

## 2019-03-19 ENCOUNTER — Ambulatory Visit (INDEPENDENT_AMBULATORY_CARE_PROVIDER_SITE_OTHER): Payer: Self-pay | Admitting: Cardiology

## 2019-03-19 VITALS — BP 122/76 | HR 69 | Ht 75.0 in | Wt 264.2 lb

## 2019-03-19 DIAGNOSIS — I493 Ventricular premature depolarization: Secondary | ICD-10-CM

## 2019-03-19 MED ORDER — METOPROLOL SUCCINATE ER 25 MG PO TB24: 75.0000 mg | ORAL_TABLET | Freq: Two times a day (BID) | ORAL | 3 refills | Status: DC

## 2019-03-19 MED ORDER — MEXILETINE HCL 250 MG PO CAPS: 250.0000 mg | ORAL_CAPSULE | Freq: Two times a day (BID) | ORAL | 3 refills | Status: DC

## 2019-03-19 MED FILL — METOPROLOL SUCCINATE ER 25: 25 | 30 days supply | Qty: 180 | Fill #0

## 2019-03-19 NOTE — Telephone Encounter (Signed)
CSW received call from pt with medication cost concerns for metoprolol- pt has been on medication but it just changed doses and the new dose is over $50/month to get which is hard to patient to afford.  CSW discussed with triage CMA and no cheaper alternative for patient available- metoprolol is available through Heart Failure fund so triage CMA sent to [facility] under [place].  CSW called pt and informed of this option  [name], [place] Worker [facility] Desk#: [phone] Cell#: [phone]

## 2019-03-19 NOTE — Patient Instructions (Addendum)
Medication Instructions:  Your physician has recommended you make the following change in your medication:  1. STOP Amiodarone 2. START Mexiletine 250 mg twice daily  *If you need a refill on your cardiac medications before your next appointment, please call your pharmacy*  Lab Work: None ordered  Testing/Procedures: None ordered  Follow-Up: At [facility], you and your health needs are our priority.  As part of our continuing mission to provide you with exceptional heart care, we have created designated Provider Care Teams.  These Care Teams include your primary Cardiologist (physician) and Advanced Practice Providers (APPs -  Physician Assistants and Nurse Practitioners) who all work together to provide you with the care you need, when you need it.  Your next appointment:   3 month(s)  The format for your next appointment:   In Person  Provider:   Allegra Lai, MD  Thank you for choosing [facility]!!   Trinidad Curet, RN [phone]   Other Instructions  Mexiletine capsules What is this medicine? MEXILETINE (mex IL e teen) is an antiarrhythmic agent. This medicine is used to treat irregular heart rhythm and can slow rapid heartbeats. It can help your heart to return to and maintain a normal rhythm. Because of the side effects caused by this medicine, it is usually used for heartbeat problems that may be life-threatening. This medicine may be used for other purposes; ask your health care provider or pharmacist if you have questions. COMMON BRAND NAME(S): Mexitil What should I tell my health care provider before I take this medicine? They need to know if you have any of these conditions:  liver disease  other heart problems  previous heart attack  an unusual or allergic reaction to mexiletine, other medicines, foods, dyes, or preservatives  pregnant or trying to get pregnant  breast-feeding How should I use this medicine? Take this medicine by mouth with  a glass of water. Follow the directions on the prescription label. It is recommended that you take this medicine with food or an antacid. Take your doses at regular intervals. Do not take your medicine more often than directed. Do not stop taking except on the advice of your doctor or health care professional. Talk to your pediatrician regarding the use of this medicine in children. Special care may be needed. Overdosage: If you think you have taken too much of this medicine contact a poison control center or emergency room at once. NOTE: This medicine is only for you. Do not share this medicine with others. What if I miss a dose? If you miss a dose, take it as soon as you can. If it is almost time for your next dose, take only that dose. Do not take double or extra doses. What may interact with this medicine? Do not take this medicine with any of the following medications:  dofetilide This medicine may also interact with the following medications:  caffeine  cimetidine  medicines for depression, anxiety, or psychotic disturbances  medicines to control heart rhythm  phenobarbital  phenytoin  rifampin  theophylline This list may not describe all possible interactions. Give your health care provider a list of all the medicines, herbs, non-prescription drugs, or dietary supplements you use. Also tell them if you smoke, drink alcohol, or use illegal drugs. Some items may interact with your medicine. What should I watch for while using this medicine? Your condition will be monitored closely when you first begin therapy. Often, this drug is first started in a hospital or  other monitored health care setting. Once you are on maintenance therapy, visit your doctor or health care provider for regular checks on your progress. Because your condition and use of this medicine carry some risk, it is a good idea to carry an identification card, necklace or bracelet with details of your condition,  medications, and doctor or health care provider. You may get drowsy or dizzy. Do not drive, use machinery, or do anything that needs mental alertness until you know how this medicine affects you. Do not stand or sit up quickly, especially if you are an older patient. This reduces the risk of dizzy or fainting spells. Alcohol can make you more dizzy, increase flushing and rapid heartbeats. Avoid alcoholic drinks. This medicine may cause serious skin reactions. They can happen weeks to months after starting the medicine. Contact your health care provider right away if you notice fevers or flu-like symptoms with a rash. The rash may be red or purple and then turn into blisters or peeling of the skin. Or, you might notice a red rash with swelling of the face, lips or lymph nodes in your neck or under your arms. What side effects may I notice from receiving this medicine? Side effects that you should report to your doctor or health care professional as soon as possible:  allergic reactions like skin rash, itching or hives, swelling of the face, lips, or tongue  breathing problems  chest pain, continued irregular heartbeats  rash, fever, and swollen lymph nodes  redness, blistering, peeling or loosening of the skin, including inside the mouth  seizures  skin rash  trembling, shaking  unusual bleeding or bruising  unusually weak or tired Side effects that usually do not require medical attention (report to your doctor or health care professional if they continue or are bothersome):  blurred vision  difficulty walking  heartburn  nausea, vomiting  nervousness  numbness, or tingling in the fingers or toes This list may not describe all possible side effects. Call your doctor for medical advice about side effects. You may report side effects to FDA at 1-800-FDA-1088. Where should I keep my medicine? Keep out of reach of children. Store at room temperature between 15 and 30 degrees C  (59 and 86 degrees F). Throw away any unused medicine after the expiration date. NOTE: This sheet is a summary. It may not cover all possible information. If you have questions about this medicine, talk to your doctor, pharmacist, or health care provider.  2020 Elsevier/Gold Standard (2018-05-02 09:25:42)

## 2019-03-19 NOTE — Progress Notes (Signed)
Electrophysiology Office Note   Date:  03/19/2019   ID:  Tyler, Berger [birth year], MRN [id number]  PCP:  Unk Pinto, MD  Cardiologist:  Aundra Dubin Primary Electrophysiologist:  Will Meredith Leeds, MD    Chief Complaint: PVC   History of Present Illness: Tyler Berger is a 59 y.o. male who is being seen today for the evaluation of PVC at the request of Tyler Dresser, MD. Presenting today for electrophysiology evaluation.  He has a history of nonischemic cardiomyopathy, SVT, hypertension, prior alcohol and tobacco abuse, mild coronary artery disease, PVCs and aortic stenosis.    In 2004 he was diagnosed with a nonischemic cardiomyopathy.  His ejection fraction recovered back to normal but he was noted to have moderate aortic stenosis and a bicuspid aortic valve.  He was in the emergency room January 2020 with SVT that converted to sinus rhythm spontaneously.  He was referred back to cardiology where an echo showed an ejection fraction of 25 to 30% with severe aortic stenosis.  He right and left heart catheterization were done that showed no significant coronary disease.  He underwent bioprosthetic AVR 04/10/2018.  He had increased PVCs postop and was put on amiodarone.  Most recent echo shows an improved ejection fraction 40 to 45%.  Initial Zio patch showed 15.9% PVCs.  Amiodarone was started and his PVC burden was reduced to 6.5%.  Today, he denies symptoms of palpitations, chest pain, shortness of breath, orthopnea, PND, lower extremity edema, claudication, dizziness, presyncope, syncope, bleeding, or neurologic sequela. The patient is tolerating medications without difficulties.    Past Medical History:  Diagnosis Date  . Anxiety   . 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 Aortic stenosis   . Ascending aortic aneurysm ([place])   . CAD (coronary artery disease)   . Chronic combined systolic and diastolic congestive heart failure ([place])   . Dilated aortic root ([place])   . Hypertension   .  Non-ischemic cardiomyopathy ([place])   . Nonischemic cardiomyopathy ([place])   . S/P aortic valve replacement with bioprosthetic valve 04/10/2018   29 mm Edwards Inspiris Resilia stented bovine pericardial tissue valve  . S/P ascending aortic aneurysm repair 04/10/2018   26 mm Hemashield Platinum supracoronary straight graft  . SVT (supraventricular tachycardia) ([place])   . Viral myocarditis 2004  . Vitamin D deficiency    Past Surgical History:  Procedure Laterality Date  . AORTIC VALVE REPLACEMENT N/A 04/10/2018   Procedure: Aortic Valve Replacement (Avr), USING INSPIRIS 29MM;  Surgeon: Rexene Alberts, MD;  Location: [facility];  Service: Open Heart Surgery;  Laterality: N/A;  . ASCENDING AORTIC ROOT REPLACEMENT N/A 04/10/2018   Procedure: RESECTION AND GRAFTING OF ASCENDING AORTIC ROOT ANUERYSM (SUPRACORONARY);  Surgeon: Rexene Alberts, MD;  Location: [facility];  Service: Open Heart Surgery;  Laterality: N/A;  . HERNIA REPAIR Left 2010  . MULTIPLE EXTRACTIONS WITH ALVEOLOPLASTY N/A 04/05/2018   Procedure: Extraction of tooth #'s 3, 12, 14,19, 23, 31 and 32 with alveoloplasty and gross debridement of remaining teeth;  Surgeon: Lenn Cal, DDS;  Location: [facility];  Service: Oral Surgery;  Laterality: N/A;  . ORCHIECTOMY Left 1982  . RIGHT HEART CATH AND CORONARY ANGIOGRAPHY N/A 03/28/2018   Procedure: RIGHT HEART CATH AND CORONARY ANGIOGRAPHY;  Surgeon: Tyler Dresser, MD;  Location: [facility];  Service: Cardiovascular;  Laterality: N/A;  . TEE WITHOUT CARDIOVERSION N/A 04/10/2018   Procedure: TRANSESOPHAGEAL ECHOCARDIOGRAM (TEE);  Surgeon: Rexene Alberts, MD;  Location: [facility];  Service: Open Heart Surgery;  Laterality: N/A;  . VARICOSE VEIN SURGERY Left 05/1998     Current Outpatient Medications  Medication Sig Dispense Refill  . acetaminophen (TYLENOL) 500 MG tablet Take 1,000 mg by mouth every 6 (six) hours as needed for mild pain or headache.    . ALPRAZolam (XANAX) 0.5 MG tablet TAKE  1/2 TO 1 (ONE-HALF TO ONE) TABLET BY MOUTH AT BEDTIME AS NEEDED FOR SLEEP +  LIMIT  TO  5  DAYS  PER  WEEK  TO  AVOID  ADDICTION 30 tablet 0  . aspirin EC 325 MG EC tablet Take 1 tablet (325 mg total) by mouth daily. 30 tablet 0  . busPIRone (BUSPAR) 10 MG tablet Take 1 tablet 3 x /day for Anxiety & Mood 270 tablet 1  . Cholecalciferol (VITAMIN D3) 125 MCG (5000 UT) TABS Take 5,000 Units by mouth daily.     Marland Kitchen eplerenone (INSPRA) 50 MG tablet Take 1 tablet (50 mg total) by mouth daily. 30 tablet 5  . furosemide (LASIX) 20 MG tablet Take 20 mg by mouth daily.    . metoprolol succinate (TOPROL-XL) 25 MG 24 hr tablet Take 3 tablets (75 mg total) by mouth 2 (two) times daily. Take with or immediately following a meal. 182 tablet 5  . potassium chloride (KLOR-CON) 10 MEQ tablet Take 1 tablet (10 mEq total) by mouth daily. 90 tablet 3  . rosuvastatin (CRESTOR) 10 MG tablet Take 1 tablet (10 mg total) by mouth daily. 30 tablet 5  . sacubitril-valsartan (ENTRESTO) 49-51 MG Take 1 tablet by mouth 2 (two) times daily. 60 tablet 11  . sertraline (ZOLOFT) 100 MG tablet Take 1 tablet (100 mg total) by mouth daily. 30 tablet 0  . [START ON 03/20/2019] mexiletine (MEXITIL) 250 MG capsule Take 1 capsule (250 mg total) by mouth 2 (two) times daily. 60 capsule 3   No current facility-administered medications for this visit.    Allergies:   Patient has no known allergies.   Social History:  The patient  reports that he quit smoking about a year ago. His smoking use included cigarettes. He quit after 30.00 years of use. He has never used smokeless tobacco. He reports previous alcohol use. He reports that he does not use drugs.   Family History:  The patient's family history includes Alzheimer's disease in his mother; Cancer in his father and mother; Diabetes in his mother; Hypertension in his father, mother, and sister.    ROS:  Please see the history of present illness.   Otherwise, review of systems is positive  for none.   All other systems are reviewed and negative.    PHYSICAL EXAM: VS:  BP 122/76   Pulse 69   Ht 6\' 3"  (1.905 m)   Wt 264 lb 3.2 oz (119.8 kg)   SpO2 96%   BMI 33.02 kg/m  , BMI Body mass index is 33.02 kg/m. GEN: Well nourished, well developed, in no acute distress  HEENT: normal  Neck: no JVD, carotid bruits, or masses Cardiac: RRR; no murmurs, rubs, or gallops,no edema  Respiratory:  clear to auscultation bilaterally, normal work of breathing GI: soft, nontender, nondistended, + BS MS: no deformity or atrophy  Skin: warm and dry Neuro:  Strength and sensation are intact Psych: euthymic mood, full affect  EKG:  EKG is ordered today. Personal review of the ekg ordered shows sinus rhythm, IVCD, rate 69  Recent Labs: 03/28/2018: B Natriuretic Peptide 1,155.1  04/14/2018: Magnesium 1.9 05/31/2018: Hemoglobin 15.7; Platelets 147 03/01/2019: ALT 28; TSH 1.498 03/15/2019: BUN 21; Creatinine, Ser 1.12; Potassium 4.2; Sodium 139    Lipid Panel     Component Value Date/Time   CHOL 141 09/25/2018 1547   TRIG 146 09/25/2018 1547   HDL 49 09/25/2018 1547   CHOLHDL 2.9 09/25/2018 1547   VLDL 15 03/28/2018 1859   LDLCALC 69 09/25/2018 1547     Wt Readings from Last 3 Encounters:  03/19/19 264 lb 3.2 oz (119.8 kg)  03/01/19 254 lb 12.8 oz (115.6 kg)  01/10/19 253 lb (114.8 kg)      Other studies Reviewed: Additional studies/ records that were reviewed today include: TTE 10/09/18  Review of the above records today demonstrates:  1. The left ventricle has mild-moderately reduced systolic function, with  an ejection fraction of 40-45%. The cavity size was normal. There is  mildly increased left ventricular wall thickness. Left ventricular  diastolic Doppler parameters are consistent  with impaired relaxation. Left ventricular diffuse hypokinesis.  Septal-lateral dyssynchrony consistent with LBBB.  2. There is mild mitral annular calcification present. No evidence of   mitral valve stenosis. No mitral regurgitation.  3. Bioprosthetic aortic valve. Mean gradient 12 mmHg. No significant  stenosis or regurgitation.  4. S/p surgical repair ascending aorta.  5. The right ventricle has normal systolic function. The cavity was  normal. There is no increase in right ventricular wall thickness.  6. Normal IVC size. PA systolic pressure 20 mmHg.  7. Trivial pericardial effusion is present.   Monitor 03/17/19 personally reviewed 1. Predominantly NSR.  2. 6.8% PVCs.    ASSESSMENT AND PLAN:  1.  PVCs: 15.9% on Zio patch.  Amnio started and his burden has decreased now to 6.8%.  It would be ideal to have him off of amiodarone due to long-term side effects.  Due to that, we will start him on mexiletine 250 mg twice a day and stop his amiodarone today.  I will see him back once amiodarone has washed out to assess that therapy.  I will discuss this with his primary cardiologist.  Unfortunately, based on his cardiac monitor, he has had PVCs of multiple morphologies which would be difficult to ablate as well as with his bioprosthetic aortic valve.  2.  Chronic systolic heart failure due to nonischemic cardiomyopathy: Ejection fraction 40 to 45%.  Currently on Entresto, Toprol-XL, eplerenone, Lasix.  Currently not a an ICD candidate.  Plan per primary cardiology.  3.  Bicuspid aortic valve with aortic stenosis severe: Status post surgical AVR with a bioprosthetic valve.  Plan per primary cardiology.  4.  Coronary artery disease: Moderate nonobstructive on catheterization February 2020.  No current chest pain.  Current medicines are reviewed at length with the patient today.   The patient does not have concerns regarding his medicines.  The following changes were made today: Stop amiodarone, start mexiletine  Labs/ tests ordered today include:  Orders Placed This Encounter  Procedures  . EKG 12-Lead     Disposition:   FU with Will Camnitz 3  months  Signed, Will Meredith Leeds, MD  03/19/2019 9:44 AM     [facility] [address] [place] [address] [phone] (office) [phone] (fax)

## 2019-03-20 ENCOUNTER — Telehealth: Payer: Self-pay | Admitting: Physician Assistant

## 2019-03-20 NOTE — Telephone Encounter (Signed)
Paged by answering service. Tyler Berger has unknown COVID exposure today. Tyler Berger is asymptomatic currently. He will get COVID test done tomorrow and monitor his symptoms. Reviewed when to seek medical attention. He was appreciative of call.

## 2019-03-25 ENCOUNTER — Other Ambulatory Visit: Payer: Self-pay

## 2019-03-26 ENCOUNTER — Other Ambulatory Visit (HOSPITAL_COMMUNITY): Payer: Self-pay | Admitting: Adult Health

## 2019-03-26 ENCOUNTER — Other Ambulatory Visit: Payer: Self-pay | Admitting: Adult Health

## 2019-03-26 DIAGNOSIS — I10 Essential (primary) hypertension: Secondary | ICD-10-CM

## 2019-03-26 MED FILL — ROSUVASTATIN CALCIUM 10 MG: 10 | 30 days supply | Qty: 30 | Fill #2

## 2019-03-29 NOTE — Progress Notes (Signed)
Patient ID: Tyler Berger, male   DOB: 1960-10-26, 59 y.o.   MRN: [id number] FOLLOW UP No charge this visit, close follow up 4-6 weeks  Assessment and Plan:  Essential hypertension - continue medications, DASH diet, exercise and monitor at home. Call if greater than 130/80.   Hyperlipidemia, unspecified hyperlipidemia type -     Lipid panel check lipids decrease fatty foods increase activity.   Chronic combined systolic and diastolic congestive heart failure (HCC) Keep food diary, follow up 4-6 weeks Continue follow up Dr. Aundra Dubin Pending A1C may talk with Dr. Aundra Dubin about giving samples of SGLT2 for heart failure and weight loss Suggest starting to work out, will send message to Dr. Aundra Dubin if he is okay with the patient starting [employer] with trainer  Non-ischemic cardiomyopathy ([place]) Keep food diary, follow up 4-6 weeks Continue follow up Dr. Aundra Dubin Pending A1C may talk with Dr. Aundra Dubin about giving samples of SGLT2 for heart failure and weight loss Suggest starting to work out, will send message to Dr. Aundra Dubin if he is okay with the patient starting [employer] with trainer  Dilated aortic root ([place]) S/p repair  Ascending aortic aneurysm ([place]) S/p repair  Abnormal glucose -     Hemoglobin A1c - may start SGLT2 samples pending results for weight loss, lower sugar and heart failure  Depression, major, recurrent, in partial remission ([place]) -     buPROPion (WELLBUTRIN XL) 150 MG 24 hr tablet; Take 1 tablet (150 mg total) by mouth every morning. - can cut zoloft in half and try wellbutrin for weight loss/mood.  - if this does not help may switch to celexa from zoloft - will see if he can start to exercise again which can help mood.    Continue diet and meds as discussed. Further disposition pending results of labs. Future Appointments  Date Time Provider [place]  05/02/2019  8:45 AM Vicie Mutters, PA-C [facility] None  06/03/2019  9:00 AM Larey Dresser,  MD [facility] None  07/02/2019  3:30 PM Constance Haw, MD [facility]  10/01/2019  3:00 PM Vicie Mutters, PA-C [facility] None    HPI 59 y.o. male  presents for  3 month follow up with hypertension, hyperlipidemia, s/p aortic valve, non ischemic CHF,  and vitamin D.  He is tearful, he states it has been a year since his first hospitalization and he is not feeling better, still limited with exercise and fatigue. He is frustrated being denied disability but very grateful for the help from [facility].   He states he will feel like "someone takes his body, turns it upside down and shakes it" at least once a day. Will last a few minutes. He will feel nausea, drained. Usually after he stands and starts to walk. No palpitations at that time. Has not tried to wear compression socks. He is having some blue/purple legs that gets better with movement.   He is on zoloft 100mg , buspar 10 mg TID and xanax 0.5mg  1/2 at night.   BMI is Body mass index is 32.75 kg/m. He would like to start going to the gym. Can not afford cardiopulm rehab.  He has quit smoking AND STATES HIS WEIGHT IS UP DUE TO THIS and not being able to work out due to fatigue/SOB. He also has some depression symptoms. .  No fatigue in the AM, no snoring, no symptoms of sleep apnea.  Wt Readings from Last 3 Encounters:  04/01/19 262 lb (118.8 kg)  03/19/19 264  lb 3.2 oz (119.8 kg)  03/01/19 254 lb 12.8 oz (115.6 kg)   Patient is s/p aortic valve replacement with biosynthetic valve and thoracic aortic aneurysm repair on [id number], has systolic heart failure with post op TEE showing EF 35-40%, improved to 40-45% on echo 10/2018.   He was swtiched from amiodarone to mexiletine and now following with EP, following with Dr. Aundra Dubin at the heart failure clinic.   He is on entresto getting free from [place] Metoprolol XL 25mg  (75,g) BID- gets free from [facility] aldactone 25mg  daily  Lasix 1 pill a day.    He has had some  depression, on zoloft before bed, will take 1/2 of xanax before bed.  Lab Results  Component Value Date   GFRNONAA >60 03/15/2019   Lab Results  Component Value Date   CREATININE 1.12 03/15/2019   BUN 21 (H) 03/15/2019   NA 139 03/15/2019   K 4.2 03/15/2019   CL 102 03/15/2019   CO2 27 03/15/2019   His blood pressure has been controlled at home,, today their BP is BP: 120/90 He is on cholesterol medication, crestor 10 and denies myalgias. His cholesterol is at goal less than 70. The cholesterol last visit was:   Lab Results  Component Value Date   CHOL 141 09/25/2018   HDL 49 09/25/2018   LDLCALC 69 09/25/2018   TRIG 146 09/25/2018   CHOLHDL 2.9 09/25/2018   Last A1C in the office was,:  Lab Results  Component Value Date   HGBA1C 6.0 (H) 09/25/2018   Patient is on Vitamin D supplement.   Lab Results  Component Value Date   VD25OH 40 04/22/2013     Current Medications:    Current Outpatient Medications (Cardiovascular):  .  eplerenone (INSPRA) 50 MG tablet, Take 1 tablet (50 mg total) by mouth daily. .  furosemide (LASIX) 80 MG tablet, Take 1/2 to 1  tablet every [place] for BP & Fluid Retention / Ankle Swelling .  metoprolol succinate (TOPROL-XL) 25 MG 24 hr tablet, Take 3 tablets (75 mg total) by mouth 2 (two) times daily. Take with or immediately following a meal. .  mexiletine (MEXITIL) 250 MG capsule, Take 1 capsule (250 mg total) by mouth 2 (two) times daily. .  rosuvastatin (CRESTOR) 10 MG tablet, Take 1 tablet (10 mg total) by mouth daily. .  sacubitril-valsartan (ENTRESTO) 49-51 MG, Take 1 tablet by mouth 2 (two) times daily.   Current Outpatient Medications (Analgesics):  .  acetaminophen (TYLENOL) 500 MG tablet, Take 1,000 mg by mouth every 6 (six) hours as needed for mild pain or headache. Marland Kitchen  aspirin EC 325 MG EC tablet, Take 1 tablet (325 mg total) by mouth daily.   Current Outpatient Medications (Other):  Marland Kitchen  ALPRAZolam (XANAX) 0.5 MG tablet, TAKE 1/2  TO 1 (ONE-HALF TO ONE) TABLET BY MOUTH AT BEDTIME AS NEEDED FOR SLEEP +  LIMIT  TO  5  DAYS  PER  WEEK  TO  AVOID  ADDICTION .  busPIRone (BUSPAR) 10 MG tablet, Take 1 tablet 3 x /day for Anxiety & Mood .  Cholecalciferol (VITAMIN D3) 125 MCG (5000 UT) TABS, Take 5,000 Units by mouth daily.  .  potassium chloride (KLOR-CON) 10 MEQ tablet, Take 1 tablet (10 mEq total) by mouth daily. Marland Kitchen  buPROPion (WELLBUTRIN XL) 150 MG 24 hr tablet, Take 1 tablet (150 mg total) by mouth every morning. .  sertraline (ZOLOFT) 100 MG tablet, Take 1 tablet (100 mg total) by mouth  daily.  Medical History:  Past Medical History:  Diagnosis Date  . Anxiety   . 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 Aortic stenosis   . Ascending aortic aneurysm ([place])   . CAD (coronary artery disease)   . Chronic combined systolic and diastolic congestive heart failure ([place])   . Dilated aortic root ([place])   . Hypertension   . Non-ischemic cardiomyopathy ([place])   . Nonischemic cardiomyopathy ([place])   . S/P aortic valve replacement with bioprosthetic valve 04/10/2018   29 mm Edwards Inspiris Resilia stented bovine pericardial tissue valve  . S/P ascending aortic aneurysm repair 04/10/2018   26 mm Hemashield Platinum supracoronary straight graft  . SVT (supraventricular tachycardia) ([place])   . Viral myocarditis 2004  . Vitamin D deficiency    Allergies No Known Allergies   Surgical History: reviewed and unchanged Family History: reviewed and unchanged Social History: reviewed and unchanged   Review of Systems  Constitutional: Positive for malaise/fatigue. Negative for chills, diaphoresis, fever and weight loss.  HENT: Negative.   Eyes: Negative.   Respiratory: Positive for shortness of breath. Negative for cough, hemoptysis, sputum production and wheezing.   Cardiovascular: Positive for leg swelling. Negative for chest pain, palpitations, orthopnea, claudication and PND.  Gastrointestinal: Negative.   Genitourinary: Negative.    Musculoskeletal: Negative.   Skin: Negative.   Neurological: Negative.   Endo/Heme/Allergies: Negative.   Psychiatric/Behavioral: Positive for depression. Negative for suicidal ideas.   Physical Exam: BP 120/90   Pulse 88   Temp (!) 97.4 F (36.3 C)   Wt 262 lb (118.8 kg)   SpO2 97%   BMI 32.75 kg/m  Wt Readings from Last 3 Encounters:  04/01/19 262 lb (118.8 kg)  03/19/19 264 lb 3.2 oz (119.8 kg)  03/01/19 254 lb 12.8 oz (115.6 kg)  Body mass index is 32.75 kg/m.  General Appearance: Well nourished, in no apparent distress. Eyes: PERRLA, EOMs, conjunctiva no swelling or erythema Sinuses: No Frontal/maxillary tenderness ENT/Mouth: Ext aud canals clear, TMs without erythema, bulging. Mouth and nose not examined- patient wearing a facemask Neck: Supple, thyroid normal.  Respiratory: Respiratory effort normal, BS equal bilaterally without rales, rhonchi, wheezing or stridor.  Cardio: Regular rate and rhythm, no murmurs, rubs or gallops.  Brisk peripheral pulses with mild bilateral edema, left leg worse than right and large torturous varicose veins bilateral legs with venous stasis Abdomen: Soft, + BS.  Non tender, no guarding, rebound, hernias, masses. Lymphatics: Non tender without lymphadenopathy.  Musculoskeletal: Full ROM, 5/5 strength, normal gait.  Skin: Warm, dry without rashes, lesions, ecchymosis.  Neuro: Cranial nerves intact. Normal muscle tone, no cerebellar symptoms.  Psych: Awake and oriented X 3, normal affect, Insight and Judgment appropriate.    Vicie Mutters 1:06 PM

## 2019-04-01 ENCOUNTER — Other Ambulatory Visit: Payer: Self-pay

## 2019-04-01 ENCOUNTER — Ambulatory Visit (INDEPENDENT_AMBULATORY_CARE_PROVIDER_SITE_OTHER): Payer: Self-pay | Admitting: Physician Assistant

## 2019-04-01 ENCOUNTER — Encounter: Payer: Self-pay | Admitting: Physician Assistant

## 2019-04-01 VITALS — BP 120/90 | HR 88 | Temp 97.4°F | Wt 262.0 lb

## 2019-04-01 DIAGNOSIS — I5042 Chronic combined systolic (congestive) and diastolic (congestive) heart failure: Secondary | ICD-10-CM

## 2019-04-01 DIAGNOSIS — F3341 Major depressive disorder, recurrent, in partial remission: Secondary | ICD-10-CM

## 2019-04-01 DIAGNOSIS — I712 Thoracic aortic aneurysm, without rupture: Secondary | ICD-10-CM

## 2019-04-01 DIAGNOSIS — R7309 Other abnormal glucose: Secondary | ICD-10-CM

## 2019-04-01 DIAGNOSIS — E782 Mixed hyperlipidemia: Secondary | ICD-10-CM

## 2019-04-01 DIAGNOSIS — E785 Hyperlipidemia, unspecified: Secondary | ICD-10-CM

## 2019-04-01 DIAGNOSIS — I77810 Thoracic aortic ectasia: Secondary | ICD-10-CM

## 2019-04-01 DIAGNOSIS — I10 Essential (primary) hypertension: Secondary | ICD-10-CM

## 2019-04-01 DIAGNOSIS — I7121 Aneurysm of the ascending aorta, without rupture: Secondary | ICD-10-CM

## 2019-04-01 DIAGNOSIS — I428 Other cardiomyopathies: Secondary | ICD-10-CM

## 2019-04-01 MED ORDER — SERTRALINE HCL 100 MG PO TABS: 100.0000 mg | ORAL_TABLET | Freq: Every day | ORAL | 3 refills | Status: DC

## 2019-04-01 MED ORDER — BUPROPION HCL ER (XL) 150 MG PO TB24: 150.0000 mg | ORAL_TABLET | ORAL | 2 refills | Status: DC

## 2019-04-01 NOTE — Patient Instructions (Addendum)
Cut the zoloft down to 1/2 a pill or 50 mg for 2 weeks Add wellbutrin 150mg  for 2 weeks After this can stay on the zoloft or stop the zoloft  IF THIS DOES NOT HELP WILL STOP ZOLOFT/WELLBUTRIN AND START ON CELEXA  Will start you on wellbutrin to try to help with mood/weight loss If you get nausea and HA after the first week please stop If you get anxious or snappy with people than stop the medication But this medication can help with energy, weight loss and mood It kicks in about 1-2 weeks And can be stopped quickly  Keep a food diary  I WILL MESSAGE DR. Aundra Dubin TO SEE IF HE IS OKAY WITH YOU STARTING AT A GYM (PLANET FITNESS) VERSUS CARDIOPULMONARY REHAB  We will check your A1C, depending on where it is we may start you on a medication that I will give you samples of. I will likely message dr. Aundra Dubin first but here is info.   STARTING A DIABETIC MEDICATION- IF WE FEEL YOU WILL BENEFIT  The medication we are going to start you on is a sodium-glucose cotransporter-2 (SGLT2) inhibitors for your diabetes and weight.   This can decrease your blood pressure so please contact us if you have any dizziness, sometime we need to decrease or stop fluid pills or decrease BP meds.  You are peeing out 300-400 calories a day of sugar which can lead to yeast infections, you can take the diflucan as needed but if the infections continue we will stop the medications.  If you get any pain or discomfort around your buttocks or genitals please let us know if it does not go away. We will have you follow up in 1 month to check your kidney function and weight. Call if you need anything.   VENOUS INSUFFICIENCY Our lower leg venous system is not the most reliable, the heart does NOT pump fluid up, there is a valve system.  The muscles of the leg squeeze and the blood moves up and a valve opens and close, then they squeeze, blood moves up and valves open and closes keeping the blood moving towards the heart.  Lots  can go wrong with this valve system.  If someone is sitting or standing without movement, everyone will get swelling.  THINGS TO DO:  Do not stand or sit in one position for long periods of time. Do not sit with your legs crossed. Rest with your legs raised during the day.  Your legs have to be higher than your heart so that gravity will force the valves to open, so please really elevate your legs.   Wear elastic stockings or support hose. Do not wear other tight, encircling garments around the legs, pelvis, or waist.  [employer]  has a wide variety of well priced compression stockings. [place], [place] [id number] [id number] [place] has a good cheap selection, I like the socks, they are not as hard to get on  Walk as much as possible to increase blood flow.  Raise the foot of your bed at night with 2-inch blocks.  SEEK MEDICAL CARE IF:   The skin around your ankle starts to break down.  You have pain, redness, tenderness, or hard swelling developing in your leg over a vein.  You are uncomfortable due to leg pain.  If you ever have shortness of breath with exertion or chest pain go to the ER.   Stasis Dermatitis Stasis dermatitis  is a long-term (chronic) skin condition that happens when veins can no longer pump blood back to the heart (poor circulation). This condition causes a red or brown scaly rash or sores (ulcers) from the pooling of blood (stasis). This condition usually affects the lower legs. It may affect one leg or both legs. Without treatment, severe stasis dermatitis can lead to other skin conditions and infections. What are the causes? This condition is caused by poor circulation. What increases the risk? You are more likely to develop this condition if:  You are not very active.  You stand for long periods of time.  You have veins that have become enlarged and twisted (varicose veins).  You have leg veins that are not strong enough to  send blood back to the heart (venous insufficiency).  You have had a blood clot.  You have been pregnant many times.  You have had vein surgery.  You are obese.  You have heart or kidney failure.  You are 65 years of age or older.  You have had injuries to your legs in the past. What are the signs or symptoms? Common early symptoms of this condition include:  Itchiness in one or both of your legs.  Swelling in your ankle or leg. This might get better overnight but be worse again during the day.  Skin that looks thin on your ankle and leg.  Red or brown marks that develop slowly.  Skin that is dry, cracked, or easily irritated.  Red, swollen skin that is sore or has a burning feeling.  An achy or heavy feeling after you walk or stand for long periods of time.  Pain. Later and more severe symptoms of this condition include:  Skin that looks shiny.  Small, open sores (ulcers). These are often red or purple and leak fluid.  Skin that feels hard.  Severe itching.  A change in the shape or color of your lower legs.  Severe pain.  Difficulty walking. How is this diagnosed? This condition may be diagnosed based on:  Your symptoms and medical history.  A physical exam. You may also have tests, including:  Blood tests.  Imaging tests to check blood flow (Doppler ultrasound).  Allergy tests. You may need to see a health care provider who specializes in skin diseases (dermatologist). How is this treated? This condition may be treated with:  Compression stockings or an elastic wrap to improve circulation.  Medicines, such as: ? Corticosteroid creams and ointments. ? Non-corticosteroid medicines applied to the skin (topical). ? Medicine to reduce swelling in the legs (diuretics). ? Antibiotics. ? Medicine to relieve itching (antihistamines).  A bandage (dressing).  A wrap that contains zinc and gelatin (Unna boot). Follow these instructions at  home: Skin care  Moisturize your skin as told by your health care provider. Do not use moisturizers with fragrance. This can irritate your skin.  Apply a cool, wet cloth (cool compress) to the affected areas.  Do not scratch your skin.  Do not rub your skin dry after a bath or shower. Gently pat your skin dry.  Do not use scented soaps, detergents, or perfumes. Medicines  Take or use over-the-counter and prescription medicines only as told by your health care provider.  If you were prescribed an antibiotic medicine, take or use it as told by your health care provider. Do not stop taking or using the antibiotic even if your condition improves. Activity  Walk as told by your health care provider. Walking increases  blood flow.  Do calf and ankle exercises throughout the day as told by your health care provider. This will help increase blood flow.  Raise (elevate) your legs above the level of your heart when you are sitting or lying down. Lifestyle  Work with your health care provider to lose weight, if needed.  Do not cross your legs when you sit.  Do not stand or sit in one position for long periods of time.  Wear comfortable, loose-fitting clothing. Circulation in your legs will be worse if you wear tight pants, belts, and waistbands.  Do not use any products that contain nicotine or tobacco, such as cigarettes, e-cigarettes, and chewing tobacco. If you need help quitting, ask your health care provider. General instructions  If you were asked to use one of the following to help with your condition, follow instructions from your health care provider on how to: ? Remove and change any dressing. ? Wear compression stockings. These stockings help to prevent blood clots and reduce swelling in your legs. ? Wear the [employer].  Keep all follow-up visits as told by your health care provider. This is important. Contact a health care provider if:  Your condition does not improve  with treatment.  Your condition gets worse.  You have signs of infection in the affected area. Watch for: ? Swelling. ? Tenderness. ? Redness. ? Soreness. ? Warmth.  You have a fever. Get help right away if:  You notice red streaks coming from the affected area.  Your bone or joint underneath the affected area becomes painful after the skin has healed.  The affected area turns darker.  You feel a deep pain in your leg or groin.  You are short of breath. Summary  Stasis dermatitis is a long-term (chronic) skin condition that happens when veins can no longer pump blood back to the heart (poor circulation).  Wear compression stockings as told by your health care provider. These stockings help to prevent blood clots and reduce swelling in your legs.  Follow instructions from your health care provider about activity, medicines, and lifestyle.  Contact a health care provider if you have a fever or have signs of infection in the affected area.  Keep all follow-up visits as told by your health care provider. This is important. This information is not intended to replace advice given to you by your health care provider. Make sure you discuss any questions you have with your health care provider. Document Revised: 06/26/2017 Document Reviewed: 06/26/2017 Elsevier Patient Education  [place].

## 2019-04-02 LAB — LIPID PANEL
Cholesterol: 174 mg/dL (ref <200)
HDL: 38 mg/dL — ABNORMAL LOW (ref >=40)
LDL Cholesterol (Calc): 102 mg/dL (calc) — ABNORMAL HIGH
Non-HDL Cholesterol (Calc): 136 mg/dL (calc) — ABNORMAL HIGH (ref <130)
Total CHOL/HDL Ratio: 4.6 (calc) (ref <5.0)
Triglycerides: 227 mg/dL — ABNORMAL HIGH (ref <150)

## 2019-04-02 LAB — HEMOGLOBIN A1C
Hgb A1c MFr Bld: 6.2 % of total Hgb — ABNORMAL HIGH (ref <5.7)
Mean Plasma Glucose: 131 (calc)
eAG (mmol/L): 7.3 (calc)

## 2019-04-03 ENCOUNTER — Encounter: Payer: Self-pay | Admitting: Physician Assistant

## 2019-04-03 NOTE — Progress Notes (Signed)
Will print out a letter for the patient to start exercising. He understands if he has any palpitations, shortness of breath, chest pain, or dizziness that he will stop and seek medical care if it continues.  Due to his diabetes and CHF, we will start him on Jardiance 10 mg, patient given 4 months of samples.  He has a follow up OV in March to check kidney function.   Future Appointments  Date Time Provider [place]  05/02/2019  8:45 AM Vicie Mutters, PA-C [facility] None  06/03/2019  9:00 AM Larey Dresser, MD [facility] None  07/02/2019  3:30 PM Constance Haw, MD [facility]  10/01/2019  3:00 PM Vicie Mutters, PA-C [facility] None

## 2019-04-04 MED ORDER — ROSUVASTATIN CALCIUM 40 MG PO TABS: ORAL_TABLET | ORAL | 1 refills | Status: DC

## 2019-04-08 ENCOUNTER — Telehealth (HOSPITAL_COMMUNITY): Payer: Self-pay | Admitting: Licensed Clinical Social Worker

## 2019-04-08 NOTE — Telephone Encounter (Signed)
CSW called pt to check in regarding disability/medicaid/CAFA.  Pt reports he has been denied for Medicaid/disability but has now hired a Chief Executive Officer to assist with his case- hopeful this will help him get approved.  Pt reports he still hasn't completed his CAFA application because his daughter who was helping him has be diagnosed with COVID so she hasn't been able to come by to help.   No further concerns at this time- CSW encouraged pt to reach out if he needs any assistance  [name], [facility] Desk#: [phone] Cell#: [phone]

## 2019-04-10 ENCOUNTER — Ambulatory Visit: Payer: Self-pay | Attending: Internal Medicine

## 2019-04-10 DIAGNOSIS — Z20822 Contact with and (suspected) exposure to covid-19: Secondary | ICD-10-CM

## 2019-04-11 ENCOUNTER — Encounter (HOSPITAL_COMMUNITY): Payer: Self-pay

## 2019-04-11 LAB — NOVEL CORONAVIRUS, NAA: SARS-CoV-2, NAA: NOT DETECTED

## 2019-04-18 MED FILL — METOPROLOL SUCCINATE ER 25: 25 | 30 days supply | Qty: 180 | Fill #1

## 2019-04-18 MED FILL — ROSUVASTATIN CALCIUM 10 MG: 10 | 30 days supply | Qty: 30 | Fill #3

## 2019-04-23 ENCOUNTER — Other Ambulatory Visit: Payer: Self-pay | Admitting: Adult Health

## 2019-04-23 DIAGNOSIS — F419 Anxiety disorder, unspecified: Secondary | ICD-10-CM

## 2019-04-29 NOTE — Progress Notes (Signed)
Patient ID: Tyler Berger, male   DOB: [birth year], 59 y.o.   MRN: [id number] FOLLOW UP  Assessment and Plan: Umbilical bleeding Normal exam, some mild erythema superior to umbilicus ? seb cyst- do heating pad, keep clean and dry No appreciable granuloma/lymphnode Recent AB CT 12/2017 without signs of malignancy Discussed needing colonoscopy and if any changes with umbilicus will need repeat AB imaging.   Essential hypertension - continue medications, DASH diet, exercise and monitor at home. Call if greater than 130/80.   Hyperlipidemia, unspecified hyperlipidemia type check lipids- next OV, crestor increased from 10 to 20 mg decrease fatty foods increase activity.   Chronic combined systolic and diastolic congestive heart failure (HCC) Weight is down 11 lbs from Feb Continue follow up Dr. Aundra Dubin Suggest starting to work out, patient has letter stating he is able to  Non-ischemic cardiomyopathy ([place]) Keep food diary Continue follow up Dr. 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 - get back to exercise Continue medications the same    Continue diet and meds as discussed. Further disposition pending results of labs. Future Appointments  Date Time Provider [place]  06/03/2019  9:00 AM Larey Dresser, MD [facility] None  07/02/2019  3:30 PM Constance Haw, MD [facility]  10/01/2019  3:00 PM Vicie Mutters, PA-C [facility] None    HPI 59 y.o. male  presents for 4-6 weeks follow up with  hypertension, hyperlipidemia, s/p aortic valve, non ischemic CHF,  and vitamin D.  Last visit he was started on crestor 20 mg and jardiance 10 mg samples.  He was also told he could start working out with a trainer which he has not started yet but he was given his letter.   He is down 11 lbs from 03/2018. No fatigue in the AM, no snoring, no symptoms of sleep apnea.  BMI is Body mass index is 31.62 kg/m., he is working on diet and exercise. Wt Readings from Last 3 Encounters:  05/02/19 253 lb (114.8 kg)  04/01/19 262 lb (118.8 kg)  03/19/19 264 lb 3.2 oz (119.8 kg)    He states he has his belly button bleeds, off and on for years. He normally washes his AB, but when it bleeds he will have bad odor, non tender, no pain, he has never had a colonoscopy, had AB CT 12/2017 that was negative for cancer/masses at that time.   He was tearful and frustrated last visit, it had been a year since his first hospitalization and he is not feeling better, still limited with exercise and fatigue. He was denied disability but very grateful for the help from [facility].  He is very sad, he lost his best friends 3 weeks ago to massive MI.  He is on zoloft 50 mg and wellbutrin 150XL which he states is helping with motivation and mood. Denies seritonin syndrome symptoms.    He states he will still feel like "someone takes his body, turns it upside down and shakes it" at least  once a day. Will last a few minutes. He will feel nausea, drained. Usually after he stands and starts to walk, has not had this issue x 1 week. No palpitations at that time. Has not tried to wear compression socks.   Patient is s/p aortic valve replacement with biosynthetic valve and thoracic aortic aneurysm repair on [id number], has systolic heart failure with post op TEE showing EF 35-40%, improved to 40-45% on echo 10/2018.   He was swtiched from amiodarone to mexiletine and now following with EP, following with Dr. Aundra Dubin at the heart failure  clinic.   H1e is on entresto getting free from [place] Metoprolol XL 25mg  (75,g) BID- gets free from [facility] aldactone 25mg  daily  Lasix 1 pill a day.    His blood pressure has been controlled at home,, today their BP is BP: 110/66 He is on cholesterol medication, increase to crestor 20 and denies myalgias. His cholesterol is at goal less than 70. The cholesterol last visit was:   Lab Results  Component Value Date   CHOL 174 04/01/2019   HDL 38 (L) 04/01/2019   LDLCALC 102 (H) 04/01/2019   TRIG 227 (H) 04/01/2019   CHOLHDL 4.6 04/01/2019   Last A1C in the office was,:  Lab Results  Component Value Date   HGBA1C 6.2 (H) 04/01/2019    Current Medications:    Current Outpatient Medications (Cardiovascular):  .  eplerenone (INSPRA) 50 MG tablet, Take 1 tablet (50 mg total) by mouth daily. .  furosemide (LASIX) 80 MG tablet, Take 1/2 to 1  tablet every Morming for BP & Fluid Retention / Ankle Swelling .  metoprolol succinate (TOPROL-XL) 25 MG 24 hr tablet, Take 3 tablets (75 mg total) by mouth 2 (two) times daily. Take with or immediately following a meal. .  mexiletine (MEXITIL) 250 MG capsule, Take 1 capsule (250 mg total) by mouth 2 (two) times daily. .  rosuvastatin (CRESTOR) 40 MG tablet, 1/2-1 tablet daily for cholesterol .  sacubitril-valsartan (ENTRESTO) 49-51 MG, Take 1 tablet by mouth 2 (two) times daily.   Current Outpatient Medications (Analgesics):  .  acetaminophen (TYLENOL) 500 MG tablet, Take 1,000 mg by mouth every 6 (six) hours as needed for mild pain or headache. Marland Kitchen  aspirin EC 325 MG EC tablet, Take 1 tablet (325 mg total) by mouth daily.   Current Outpatient Medications (Other):  Marland Kitchen  ALPRAZolam (XANAX) 0.5 MG tablet, TAKE 1/2 TO 1 (ONE-HALF TO ONE) TABLET BY MOUTH EVERY DAY AT BEDTIME AS NEEDED FOR SLEEP AND LIMIT TO 5 DAYS PER WEEK TO AVOID ADDICTION. Marland Kitchen  buPROPion (WELLBUTRIN XL) 150 MG 24 hr tablet, Take 1 tablet (150 mg total) by mouth every morning. .   busPIRone (BUSPAR) 10 MG tablet, Take 1 tablet 3 x /day for Anxiety & Mood .  Cholecalciferol (VITAMIN D3) 125 MCG (5000 UT) TABS, Take 5,000 Units by mouth daily.  .  sertraline (ZOLOFT) 100 MG tablet, Take 1 tablet (100 mg total) by mouth daily. .  potassium chloride (KLOR-CON) 10 MEQ tablet, Take 1 tablet (10 mEq total) by mouth daily.  Medical History:  Past Medical History:  Diagnosis Date  . Anxiety   . 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 Aortic stenosis   . Ascending aortic aneurysm ([place])   . CAD (coronary artery disease)   . Chronic combined systolic and diastolic congestive heart failure ([place])   . Dilated aortic root ([place])   . Hypertension   . Non-ischemic cardiomyopathy ([place])   .  Nonischemic cardiomyopathy ([place])   . S/P aortic valve replacement with bioprosthetic valve 04/10/2018   29 mm Edwards Inspiris Resilia stented bovine pericardial tissue valve  . S/P ascending aortic aneurysm repair 04/10/2018   26 mm Hemashield Platinum supracoronary straight graft  . SVT (supraventricular tachycardia) ([place])   . Viral myocarditis 2004  . Vitamin D deficiency    Allergies No Known Allergies   Surgical History: reviewed and unchanged Family History: reviewed and unchanged Social History: reviewed and unchanged   Review of Systems  Constitutional: Positive for malaise/fatigue. Negative for chills, diaphoresis, fever and weight loss.  HENT: Negative.   Eyes: Negative.   Respiratory: Positive for shortness of breath. Negative for cough, hemoptysis, sputum production and wheezing.   Cardiovascular: Positive for leg swelling. Negative for chest pain, palpitations, orthopnea, claudication and PND.  Gastrointestinal: Negative.   Genitourinary: Negative.   Musculoskeletal: Negative.   Skin: Negative.   Neurological: Negative.   Endo/Heme/Allergies: Negative.   Psychiatric/Behavioral: Negative for depression and suicidal ideas.   Physical Exam: BP 110/66   Pulse (!) 56   Temp (!) 97  F (36.1 C)   Wt 253 lb (114.8 kg)   SpO2 94%   BMI 31.62 kg/m  Wt Readings from Last 3 Encounters:  05/02/19 253 lb (114.8 kg)  04/01/19 262 lb (118.8 kg)  03/19/19 264 lb 3.2 oz (119.8 kg)  Body mass index is 31.62 kg/m.  General Appearance: Well nourished, in no apparent distress. Eyes: PERRLA, EOMs, conjunctiva no swelling or erythema Sinuses: No Frontal/maxillary tenderness ENT/Mouth: Ext aud canals clear, TMs without erythema, bulging. Mouth and nose not examined- patient wearing a facemask Neck: Supple, thyroid normal.  Respiratory: Respiratory effort normal, BS equal bilaterally without rales, rhonchi, wheezing or stridor.  Cardio: Regular rate and rhythm, no murmurs, rubs or gallops.  Brisk peripheral pulses with mild bilateral edema, left leg worse than right and large torturous varicose veins bilateral legs with venous stasis Abdomen: Soft, + BS., mild erythema around superior umbilicus, non tender, no warmth, unable to express any discharge.  Non tender, no guarding, rebound, hernias, masses. Lymphatics: Non tender without lymphadenopathy.  Musculoskeletal: Full ROM, 5/5 strength, normal gait.  Skin: Warm, dry without rashes, lesions, ecchymosis.  Neuro: Cranial nerves intact. Normal muscle tone, no cerebellar symptoms.  Psych: Awake and oriented X 3, normal affect, Insight and Judgment appropriate.    Vicie Mutters 9:00 AM

## 2019-05-02 ENCOUNTER — Other Ambulatory Visit: Payer: Self-pay

## 2019-05-02 ENCOUNTER — Ambulatory Visit (INDEPENDENT_AMBULATORY_CARE_PROVIDER_SITE_OTHER): Payer: Self-pay | Admitting: Physician Assistant

## 2019-05-02 ENCOUNTER — Encounter: Payer: Self-pay | Admitting: Physician Assistant

## 2019-05-02 VITALS — BP 110/66 | HR 56 | Temp 97.0°F | Wt 253.0 lb

## 2019-05-02 DIAGNOSIS — F3341 Major depressive disorder, recurrent, in partial remission: Secondary | ICD-10-CM

## 2019-05-02 DIAGNOSIS — I10 Essential (primary) hypertension: Secondary | ICD-10-CM

## 2019-05-02 DIAGNOSIS — E785 Hyperlipidemia, unspecified: Secondary | ICD-10-CM

## 2019-05-02 DIAGNOSIS — R198 Other specified symptoms and signs involving the digestive system and abdomen: Secondary | ICD-10-CM

## 2019-05-02 DIAGNOSIS — I712 Thoracic aortic aneurysm, without rupture: Secondary | ICD-10-CM

## 2019-05-02 DIAGNOSIS — I428 Other cardiomyopathies: Secondary | ICD-10-CM

## 2019-05-02 DIAGNOSIS — I5042 Chronic combined systolic (congestive) and diastolic (congestive) heart failure: Secondary | ICD-10-CM

## 2019-05-02 DIAGNOSIS — E782 Mixed hyperlipidemia: Secondary | ICD-10-CM

## 2019-05-02 DIAGNOSIS — I77810 Thoracic aortic ectasia: Secondary | ICD-10-CM

## 2019-05-02 DIAGNOSIS — I7121 Aneurysm of the ascending aorta, without rupture: Secondary | ICD-10-CM

## 2019-05-02 LAB — COMPLETE METABOLIC PANEL WITH GFR
AG Ratio: 2.1 (calc) (ref 1.0–2.5)
ALT: 27 U/L (ref 9–46)
AST: 22 U/L (ref 10–35)
Albumin: 4.6 g/dL (ref 3.6–5.1)
Alkaline phosphatase (APISO): 65 U/L (ref 35–144)
BUN: 25 mg/dL (ref 7–25)
CO2: 27 mmol/L (ref 20–32)
Calcium: 9.4 mg/dL (ref 8.6–10.3)
Chloride: 101 mmol/L (ref 98–110)
Creat: 1.24 mg/dL (ref 0.70–1.33)
GFR, Est African American: 73 mL/min/{1.73_m2} (ref >=60)
GFR, Est Non African American: 63 mL/min/{1.73_m2} (ref >=60)
Globulin: 2.2 g/dL (calc) (ref 1.9–3.7)
Glucose, Bld: 114 mg/dL — ABNORMAL HIGH (ref 65–99)
Potassium: 4.4 mmol/L (ref 3.5–5.3)
Sodium: 137 mmol/L (ref 135–146)
Total Bilirubin: 0.8 mg/dL (ref 0.2–1.2)
Total Protein: 6.8 g/dL (ref 6.1–8.1)

## 2019-05-02 NOTE — Patient Instructions (Addendum)
Please be aware that some of the medications that you are on can sometimes cause a rare and potentially dangerous adverse reaction, called SEROTONIN SYNDROME: Symptoms of this condition include (but are not limited to):  Agitation or restlessness, confusion, rapid heart rate and high blood pressure, dilated pupils, loss of muscle coordination or twitching muscles, muscle rigidity/stiffness, sweating and/or flushing, diarrhea, headache, shivering, goose bumps. If you have any of these symptoms you may have to stop the medication. Call your health care provider immediately.  Severe serotonin syndrome can be life-threatening emergency. Signs and symptoms of a severe reaction may include: high fever, seizures, irregular heartbeat, unconsciousness or altered level of awareness or personality changes.  If you have any of these new symptoms, call 911 or have someone take you to the emergency room.   Going to refer for colonoscopy WHEN YOU HAVE INSURANCE  Colon cancer is 3rd most diagnosed cancer and 2nd leading cause of death in both men and women 48 years of age and older despite being one of the most preventable and treatable cancers if found early.  4 of out 5 people diagnosed with colon cancer have NO prior family history.  When caught EARLY 90% of colon cancer is curable.  LIKELY THE AREA ON YOUR BELLY BUTTON IS A SEBACEOUS CYST CAN DO WARM HEATING PAD BUT IF IT GETS LARGER OR TENDER PLEASE LET us KNOW, I WOULD WANT TO GET IMAGING   Epidermal Cyst  An epidermal cyst is a sac made of skin tissue. The sac contains a substance called keratin. Keratin is a protein that is normally secreted through the hair follicles. When keratin becomes trapped in the top layer of skin (epidermis), it can form an epidermal cyst. Epidermal cysts can be found anywhere on your body. These cysts are usually harmless (benign), and they may not cause symptoms unless they become infected. What are the causes? This condition  may be caused by:  A blocked hair follicle.  A hair that curls and re-enters the skin instead of growing straight out of the skin (ingrown hair).  A blocked pore.  Irritated skin.  An injury to the skin.  Certain conditions that are passed along from parent to child (inherited).  Human papillomavirus (HPV).  Long-term (chronic) sun damage to the skin. What increases the risk? The following factors may make you more likely to develop an epidermal cyst:  Having acne.  Being overweight.  Being 37-70 years old. What are the signs or symptoms? The only symptom of this condition may be a small, painless lump underneath the skin. When an epidermal cyst ruptures, it may become infected. Symptoms may include:  Redness.  Inflammation.  Tenderness.  Warmth.  Fever.  Keratin draining from the cyst. Keratin is grayish-white, bad-smelling substance.  Pus draining from the cyst. How is this diagnosed? This condition is diagnosed with a physical exam.  In some cases, you may have a sample of tissue (biopsy) taken from your cyst to be examined under a microscope or tested for bacteria.  You may be referred to a health care provider who specializes in skin care (dermatologist). How is this treated? In many cases, epidermal cysts go away on their own without treatment. If a cyst becomes infected, treatment may include:  Opening and draining the cyst, done by a health care provider. After draining, minor surgery to remove the rest of the cyst may be done.  Antibiotic medicine.  Injections of medicines (steroids) that help to reduce inflammation.  Surgery  to remove the cyst. Surgery may be done if the cyst: ? Becomes large. ? Bothers you. ? Has a chance of turning into cancer.  Do not try to open a cyst yourself. Follow these instructions at home:  Take over-the-counter and prescription medicines only as told by your health care provider.  If you were prescribed an  antibiotic medicine, take it it as told by your health care provider. Do not stop using the antibiotic even if you start to feel better.  Keep the area around your cyst clean and dry.  Wear loose, dry clothing.  Avoid touching your cyst.  Check your cyst every day for signs of infection. Check for: ? Redness, swelling, or pain. ? Fluid or blood. ? Warmth. ? Pus or a bad smell.  Keep all follow-up visits as told by your health care provider. This is important. How is this prevented?  Wear clean, dry, clothing.  Avoid wearing tight clothing.  Keep your skin clean and dry. Take showers or baths every day. Contact a health care provider if:  Your cyst develops symptoms of infection.  Your condition is not improving or is getting worse.  You develop a cyst that looks different from other cysts you have had.  You have a fever. Get help right away if:  Redness spreads from the cyst into the surrounding area. Summary  An epidermal cyst is a sac made of skin tissue. These cysts are usually harmless (benign), and they may not cause symptoms unless they become infected.  If a cyst becomes infected, treatment may include surgery to open and drain the cyst, or to remove it. Treatment may also include medicines by mouth or through an injection.  Take over-the-counter and prescription medicines only as told by your health care provider. If you were prescribed an antibiotic medicine, take it as told by your health care provider. Do not stop using the antibiotic even if you start to feel better.  Contact a health care provider if your condition is not improving or is getting worse.  Keep all follow-up visits as told by your health care provider. This is important. This information is not intended to replace advice given to you by your health care provider. Make sure you discuss any questions you have with your health care provider. Document Revised: 05/17/2018 Document Reviewed:  08/07/2017 Elsevier Patient Education  [place].

## 2019-05-23 MED FILL — METOPROLOL SUCCINATE ER 25: 25 | 30 days supply | Qty: 180 | Fill #2

## 2019-06-03 ENCOUNTER — Ambulatory Visit (HOSPITAL_COMMUNITY)
Admission: RE | Admit: 2019-06-03 | Discharge: 2019-06-03 | Disposition: A | Discharge status: Home / Self Care | Payer: Self-pay | Source: Ambulatory Visit | Attending: Cardiology | Admitting: Cardiology

## 2019-06-03 ENCOUNTER — Encounter (HOSPITAL_COMMUNITY): Payer: Self-pay | Admitting: Cardiology

## 2019-06-03 ENCOUNTER — Other Ambulatory Visit: Payer: Self-pay

## 2019-06-03 VITALS — BP 130/88 | HR 66 | Wt 250.8 lb

## 2019-06-03 DIAGNOSIS — Z820 Family history of epilepsy and other diseases of the nervous system: Secondary | ICD-10-CM | POA: Insufficient documentation

## 2019-06-03 DIAGNOSIS — Z79899 Other long term (current) drug therapy: Secondary | ICD-10-CM | POA: Insufficient documentation

## 2019-06-03 DIAGNOSIS — I5042 Chronic combined systolic (congestive) and diastolic (congestive) heart failure: Secondary | ICD-10-CM

## 2019-06-03 DIAGNOSIS — I2721 Secondary pulmonary arterial hypertension: Secondary | ICD-10-CM | POA: Insufficient documentation

## 2019-06-03 DIAGNOSIS — I447 Left bundle-branch block, unspecified: Secondary | ICD-10-CM | POA: Insufficient documentation

## 2019-06-03 DIAGNOSIS — Z7982 Long term (current) use of aspirin: Secondary | ICD-10-CM | POA: Insufficient documentation

## 2019-06-03 DIAGNOSIS — K219 Gastro-esophageal reflux disease without esophagitis: Secondary | ICD-10-CM | POA: Insufficient documentation

## 2019-06-03 DIAGNOSIS — Z87891 Personal history of nicotine dependence: Secondary | ICD-10-CM | POA: Insufficient documentation

## 2019-06-03 DIAGNOSIS — Z803 Family history of malignant neoplasm of breast: Secondary | ICD-10-CM | POA: Insufficient documentation

## 2019-06-03 DIAGNOSIS — I493 Ventricular premature depolarization: Secondary | ICD-10-CM

## 2019-06-03 DIAGNOSIS — I428 Other cardiomyopathies: Secondary | ICD-10-CM | POA: Insufficient documentation

## 2019-06-03 DIAGNOSIS — Z953 Presence of xenogenic heart valve: Secondary | ICD-10-CM | POA: Insufficient documentation

## 2019-06-03 DIAGNOSIS — Z8249 Family history of ischemic heart disease and other diseases of the circulatory system: Secondary | ICD-10-CM | POA: Insufficient documentation

## 2019-06-03 DIAGNOSIS — F1010 Alcohol abuse, uncomplicated: Secondary | ICD-10-CM | POA: Insufficient documentation

## 2019-06-03 DIAGNOSIS — I5022 Chronic systolic (congestive) heart failure: Secondary | ICD-10-CM | POA: Insufficient documentation

## 2019-06-03 DIAGNOSIS — Z833 Family history of diabetes mellitus: Secondary | ICD-10-CM | POA: Insufficient documentation

## 2019-06-03 DIAGNOSIS — I2510 Atherosclerotic heart disease of native coronary artery without angina pectoris: Secondary | ICD-10-CM | POA: Insufficient documentation

## 2019-06-03 DIAGNOSIS — Z7984 Long term (current) use of oral hypoglycemic drugs: Secondary | ICD-10-CM | POA: Insufficient documentation

## 2019-06-03 DIAGNOSIS — I110 Hypertensive heart disease with heart failure: Secondary | ICD-10-CM | POA: Insufficient documentation

## 2019-06-03 LAB — BASIC METABOLIC PANEL
Anion gap: 9 (ref 5–15)
BUN: 22 mg/dL — ABNORMAL HIGH (ref 6–20)
CO2: 26 mmol/L (ref 22–32)
Calcium: 8.8 mg/dL — ABNORMAL LOW (ref 8.9–10.3)
Chloride: 103 mmol/L (ref 98–111)
Creatinine, Ser: 1.02 mg/dL (ref 0.61–1.24)
GFR calc Af Amer: >60 mL/min (ref >60)
GFR calc non Af Amer: >60 mL/min (ref >60)
Glucose, Bld: 132 mg/dL — ABNORMAL HIGH (ref 70–99)
Potassium: 3.9 mmol/L (ref 3.5–5.1)
Sodium: 138 mmol/L (ref 135–145)

## 2019-06-03 MED ORDER — PANTOPRAZOLE SODIUM 40 MG PO TBEC: 40.0000 mg | DELAYED_RELEASE_TABLET | Freq: Every day | ORAL | 11 refills | Status: DC

## 2019-06-03 MED ORDER — METOPROLOL SUCCINATE ER 100 MG PO TB24: 100.0000 mg | ORAL_TABLET | Freq: Two times a day (BID) | ORAL | 4 refills | Status: DC

## 2019-06-03 NOTE — Progress Notes (Signed)
Zio patch placed onto patient.  All instructions and information reviewed with patient, they verbalize understanding with no questions. 

## 2019-06-03 NOTE — Patient Instructions (Addendum)
INCREASE Toprol 100mg  (1 tab) twice a day  START Protonix 40mg  (1 tab) daily  Labs today We will only contact you if something comes back abnormal or we need to make some changes. Otherwise no news is good news!   Your provider has recommended that  you wear a Zio Patch for 3 days.  This monitor will record your heart rhythm for our review.  IF you have any symptoms while wearing the monitor please press the button.  If you have any issues with the patch or you notice a red or orange light on it please call the company at [phone].  Once you remove the patch please mail it back to the company as soon as possible so we can get the results.  Your physician recommends that you schedule a follow-up appointment in: 3 months with Dr Aundra Dubin  Please call office at [phone] option 2 if you have any questions or concerns.   At the [facility], you and your health needs are our priority. As part of our continuing mission to provide you with exceptional heart care, we have created designated Provider Care Teams. These Care Teams include your primary Cardiologist (physician) and Advanced Practice Providers (APPs- Physician Assistants and Nurse Practitioners) who all work together to provide you with the care you need, when you need it.   You may see any of the following providers on your designated Care Team at your next follow up: Marland Kitchen Dr Glori Bickers . Dr Loralie Champagne . Darrick Grinder, NP . Lyda Jester, PA . Audry Riles, PharmD   Please be sure to bring in all your medications bottles to every appointment.

## 2019-06-03 NOTE — Progress Notes (Signed)
PCP: Unk Pinto, MD HF Cardiology: Dr Aundra Dubin   HPI: 59 y.o. with history of cardiomyopathy and aortic stenosis returns for followup in CHF.  In 2004, he was diagnosed with nonischemic cardiomyopathy, EF 20% at that time.  His EF recovered back up to 60-65%, but he was noted to have moderate aortic stenosis with a bicuspid valve. He was not seen by cardiology for a number of years.   He was in the ER in 1/20 with SVT that converted spontaneously.  He was set up for an echo and sent back to cardiology.  Echo showed EF 25-30%, diffuse hypokinesis, probably severe aortic stenosis with bicuspid aortic valve.  He was referred for RHC/LHC.    RHC/LHC was done 2/20.  He had moderate coronary disease but no critical stenoses and no disease to explain his cardiomyopathy.  RHC showed markedly elevated PCWP, mixed pulmonary venous/pulmonary arterial hypertension, and low CI at 1.7.  I decided to admit him for diuresis, optimization of meds, and further workup of aortic stenosis.  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  He was evaluated by the structural heart team but valve area thought to be too big for the existing TAVR valves.  Therefore, surgical AVR was recommended.   On 04/10/18 he underwent bioprosthetic AVR with supracoronary ascending aorta replacement  by Dr Roxy Manns. He was placed on amiodarone due to increased PVCs post-op. Discharge weight was 224 pounds.  Echo in 8/20 showed EF 40-45% with mild LVH, septal-lateral dyssynchrony, normal bioprosthetic aortic valve.  Zio patch in 12/20 showed 15.9% PVCs and NSVT runs.  Amiodarone was started.   Since I last saw him, he saw Dr. Curt Bears and amiodarone was stopped, mexiletine was started.  Rare palpitations, rare dizziness.  No syncope.  He is short of breath if he is to "walk fast."  He fatigues easily.  Uses exercise bike.  He has heartburn-type symptoms when he lies down in bed at night.  Tums helps.    Labs (8/20): LDL 69, HDL  49, K 3.8, creatinine 1.22  Labs (9/20): K 4.2, creatinine 1.08 Labs (12/20): K 3.4, creatinine 1.22  Labs (3/21): K 4.4, creatinine 1.24  PMH: 1. SVT 2. HTN 3. Prior ETOH abuse 4. Prior smoking 5. Chronic systolic CHF: Nonischemic cardiomyopathy.  - Echo (2004) with EF 20%.  - Echo (1/20): EF 25-30%, mild LVH, moderate LV dilation, bicuspid aortic valve with severe AS.  - RHC (2/20): mean RA 12, PA 70/32 mean 47, mean PCWP 30, CI 1.72, PVR 4.2.  - Echo (9/20): EF 40-45%, mild LVH, septal-lateral dyssynchrony, normal RV size and systolic function, bioprosthetic AVR functions normally.  6. LBBB 7. CAD: LHC (2/20) witih 80% stenosis small D1, 50% stenosis moderate D2, 50% mid LCx, 50% distal RCA.  8. Biscuspid aortic valve disorder: Severe AS and dilated ascending aorta.   - 3/20 bioprosthetic AVR + supracoronary ascending aorta replacement.  9. PVCs: Zio patch 12/20 showed 15.9% PVCs, NSVT runs.   ROS: All systems negative except as listed in HPI, PMH and Problem List.  SH:  Social History   Socioeconomic History  . Marital status: Married    Spouse name: Not on file  . Number of children: 2  . Years of education: Not on file  . Highest education level: Not on file  Occupational History  . 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 Smoking status: Former Smoker    Years: 30.00    Types: Cigarettes    Quit  date: 03/28/2018    Years since quitting: 1.1  . Smokeless tobacco: Never Used  Substance and Sexual Activity  . Alcohol use: Not Currently  . Drug use: Never  . Sexual activity: Not on file  Other Topics Concern  . Not on file  Social History Narrative   Married, med Designer, multimedia.  Two children.     Social Determinants of Health   Financial Resource Strain:   . Difficulty of Paying Living Expenses:   Food Insecurity:   . Worried About Charity fundraiser in the Last Year:   . Arboriculturist in the Last Year:   Transportation Needs:   . Lexicographer (Medical):   Marland Kitchen Lack of Transportation (Non-Medical):   Physical Activity:   . Days of Exercise per Week:   . Minutes of Exercise per Session:   Stress:   . Feeling of Stress :   Social Connections:   . Frequency of Communication with Friends and Family:   . Frequency of Social Gatherings with Friends and Family:   . Attends Religious Services:   . Active Member of Clubs or Organizations:   . Attends Archivist Meetings:   Marland Kitchen Marital Status:   Intimate Partner Violence:   . Fear of Current or Ex-Partner:   . Emotionally Abused:   Marland Kitchen Physically Abused:   . Sexually Abused:     FH:  Family History  Problem Relation Age of Onset  . Diabetes Mother   . Hypertension Mother   . Cancer Mother        breast  . Alzheimer's disease Mother   . Hypertension Father   . Cancer Father        head and neck  . Hypertension Sister      Current Outpatient Medications  Medication Sig Dispense Refill  . acetaminophen (TYLENOL) 500 MG tablet Take 1,000 mg by mouth every 6 (six) hours as needed for mild pain or headache.    . ALPRAZolam (XANAX) 0.5 MG tablet Take 0.5 mg by mouth at bedtime. TAKE 1/2 TO 1 (ONE-HALF TO ONE) TABLET BY MOUTH EVERY DAY AT BEDTIME AS NEEDED FOR SLEEP AND LIMIT TO 5 DAYS PER WEEK TO AVOID ADDICTION.    Marland Kitchen aspirin EC 325 MG EC tablet Take 1 tablet (325 mg total) by mouth daily. 30 tablet 0  . buPROPion (WELLBUTRIN XL) 150 MG 24 hr tablet Take 1 tablet (150 mg total) by mouth every morning. 30 tablet 2  . busPIRone (BUSPAR) 10 MG tablet Take 1 tablet 3 x /day for Anxiety & Mood 270 tablet 1  . Cholecalciferol (VITAMIN D3) 125 MCG (5000 UT) TABS Take 5,000 Units by mouth daily.     . empagliflozin (JARDIANCE) 10 MG TABS tablet Take 10 mg by mouth daily.    Marland Kitchen eplerenone (INSPRA) 50 MG tablet Take 1 tablet (50 mg total) by mouth daily. 30 tablet 5  . furosemide (LASIX) 20 MG tablet Take 20 mg by mouth daily.    . metoprolol succinate (TOPROL-XL) 100  MG 24 hr tablet Take 1 tablet (100 mg total) by mouth in the morning and at bedtime. Take with or immediately following a meal. 60 tablet 4  . mexiletine (MEXITIL) 250 MG capsule Take 1 capsule (250 mg total) by mouth 2 (two) times daily. 60 capsule 3  . potassium chloride (KLOR-CON) 10 MEQ tablet Take 1 tablet (10 mEq total) by mouth daily. 90 tablet 3  . rosuvastatin (CRESTOR) 40 MG  tablet 1/2-1 tablet daily for cholesterol 90 tablet 1  . sacubitril-valsartan (ENTRESTO) 49-51 MG Take 1 tablet by mouth 2 (two) times daily. 60 tablet 11  . sertraline (ZOLOFT) 100 MG tablet Take 1 tablet (100 mg total) by mouth daily. 90 tablet 3  . pantoprazole (PROTONIX) 40 MG tablet Take 1 tablet (40 mg total) by mouth daily. 30 tablet 11   No current facility-administered medications for this encounter.    Vitals:   06/03/19 0853  BP: 130/88  Pulse: 66  SpO2: 99%  Weight: 113.8 kg (250 lb 12.8 oz)   Wt Readings from Last 3 Encounters:  06/03/19 113.8 kg (250 lb 12.8 oz)  05/02/19 114.8 kg (253 lb)  04/01/19 118.8 kg (262 lb)    PHYSICAL EXAM: General: NAD Neck: No JVD, no thyromegaly or thyroid nodule.  Lungs: Clear to auscultation bilaterally with normal respiratory effort. CV: Nondisplaced PMI.  Heart regular S1/S2, no S3/S4, 1/6 SEM RUSB.  No peripheral edema.  No carotid bruit.  Normal pedal pulses.  Abdomen: Soft, nontender, no hepatosplenomegaly, no distention.  Skin: Intact without lesions or rashes.  Neurologic: Alert and oriented x 3.  Psych: Normal affect. Extremities: No clubbing or cyanosis.  HEENT: Normal.   ASSESSMENT & PLAN: 1. Chronic systolic CHF: Nonischemic cardiomyopathy. This pre-existed severe AS, ?if PVCs play a role.  Echo in 9/20 showed EF up to 40-45%.  He is not volume overloaded on exam, NYHA class II.  - Continue Entresto 49/51 bid.   - Increase Toprol XL to 100 mg bid.   - Continue eplerenone 50 mg daily (painful gynecomastia with spironolactone, now resolved).    - Continue Lasix 20 mg daily.  Continue KCl 10 daily.  BMET today.  - EF is out of ICD range.  If EF were to fall in the future, would be CRT candidate with LBBB.  2. Bicuspid aortic valve disorder: Severe AS, now s/p surgical AVR (annulus too large for TAVR) and ascending aorta replacement.  Echo in 9/20 showed a normally functioning bioprosthetic aortic valve.  - He will need endocarditis prophylaxis with dental work.  3. ETOH abuse: Rarely drinks alcohol now.  4. PVCs: 15.9% PVCs on 12/20 Zio patch (symptomatic).  Amiodarone started with improvement.  To prevent long-term amiodarone use, he was transitioned to mexiletine.  - Continue mexiletine.  - I will repeat Zio patch as he has been off amiodarone now for 2-3 months.  Assess PVC control on mexiletine.  5. CAD: Moderate nonobstructive CAD on 2/20 cath.  No chest pain.  I do not think that this contributed to his cardiomyopathy appreciably.  - Good lipids on Crestor.  - Continue ASA.   6. LBBB: Chronic.  7. GERD symptoms: Noted when lying in bed at night.   - Start Protonix 40 mg daily.   Follow up in 3 months  Tyler Berger 06/03/2019

## 2019-06-24 ENCOUNTER — Other Ambulatory Visit: Payer: Self-pay | Admitting: Physician Assistant

## 2019-06-24 NOTE — Addendum Note (Signed)
Encounter addended by: Micki Riley, RN on: 06/24/2019 1:57 PM  Actions taken: Imaging Exam ended

## 2019-07-02 ENCOUNTER — Ambulatory Visit (INDEPENDENT_AMBULATORY_CARE_PROVIDER_SITE_OTHER): Payer: Self-pay | Admitting: Cardiology

## 2019-07-02 ENCOUNTER — Other Ambulatory Visit: Payer: Self-pay

## 2019-07-02 ENCOUNTER — Other Ambulatory Visit: Payer: Self-pay | Admitting: Internal Medicine

## 2019-07-02 ENCOUNTER — Other Ambulatory Visit: Payer: Self-pay | Admitting: Physician Assistant

## 2019-07-02 ENCOUNTER — Encounter: Payer: Self-pay | Admitting: Cardiology

## 2019-07-02 VITALS — BP 104/76 | HR 75 | Ht 75.0 in | Wt 253.0 lb

## 2019-07-02 DIAGNOSIS — F419 Anxiety disorder, unspecified: Secondary | ICD-10-CM

## 2019-07-02 DIAGNOSIS — I493 Ventricular premature depolarization: Secondary | ICD-10-CM

## 2019-07-02 DIAGNOSIS — F3341 Major depressive disorder, recurrent, in partial remission: Secondary | ICD-10-CM

## 2019-07-02 NOTE — Progress Notes (Signed)
Electrophysiology Office Note   Date:  07/02/2019   ID:  Tyler Berger, Tyler Berger Sep 07, 1960, MRN [id number]  PCP:  Unk Pinto, MD  Cardiologist:  Aundra Dubin Primary Electrophysiologist:  Tyler Meredith Leeds, MD    Chief Complaint: PVC   History of Present Illness: Tyler Berger is a 59 y.o. male who is being seen today for the evaluation of PVC at the request of Unk Pinto, MD. Presenting today for electrophysiology evaluation.  He has a history of nonischemic cardiomyopathy, SVT, hypertension, prior alcohol and tobacco abuse, mild coronary artery disease, PVCs and aortic stenosis.    In 2004 he was diagnosed with a nonischemic cardiomyopathy.  His ejection fraction recovered back to normal but he was noted to have moderate aortic stenosis and a bicuspid aortic valve.  He was in the emergency room January 2020 with SVT that converted to sinus rhythm spontaneously.  He was referred back to cardiology where an echo showed an ejection fraction of 25 to 30% with severe aortic stenosis.  He right and left heart catheterization were done that showed no significant coronary disease.  He underwent bioprosthetic AVR 04/10/2018.  He had increased PVCs postop and was put on amiodarone.  Most recent echo shows an improved ejection fraction 40 to 45%.  Initial Zio patch showed 15.9% PVCs.  Amiodarone was started and his PVC burden was reduced to 6.5%.  Today, denies symptoms of palpitations, chest pain, shortness of breath, orthopnea, PND, lower extremity edema, claudication, dizziness, presyncope, syncope, bleeding, or neurologic sequela. The patient is tolerating medications without difficulties.  He recently wore a cardiac monitor that showed a significant reduction in his PVC burden from 15% to 3.4%.  He continues to be on mexiletine and is tolerating it well without complaints.  Is able to do all of his daily activities.  At times he does feel dizzy, but most the time he is doing well.   Past Medical  History:  Diagnosis Date  . Anxiety   . 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 Aortic stenosis   . Ascending aortic aneurysm ([place])   . CAD (coronary artery disease)   . Chronic combined systolic and diastolic congestive heart failure ([place])   . Dilated aortic root ([place])   . Hypertension   . Non-ischemic cardiomyopathy ([place])   . Nonischemic cardiomyopathy ([place])   . S/P aortic valve replacement with bioprosthetic valve 04/10/2018   29 mm Edwards Inspiris Resilia stented bovine pericardial tissue valve  . S/P ascending aortic aneurysm repair 04/10/2018   26 mm Hemashield Platinum supracoronary straight graft  . SVT (supraventricular tachycardia) ([place])   . Viral myocarditis 2004  . Vitamin D deficiency    Past Surgical History:  Procedure Laterality Date  . AORTIC VALVE REPLACEMENT N/A 04/10/2018   Procedure: Aortic Valve Replacement (Avr), USING INSPIRIS 29MM;  Surgeon: Rexene Alberts, MD;  Location: [facility];  Service: Open Heart Surgery;  Laterality: N/A;  . ASCENDING AORTIC ROOT REPLACEMENT N/A 04/10/2018   Procedure: RESECTION AND GRAFTING OF ASCENDING AORTIC ROOT ANUERYSM (SUPRACORONARY);  Surgeon: Rexene Alberts, MD;  Location: [facility];  Service: Open Heart Surgery;  Laterality: N/A;  . HERNIA REPAIR Left 2010  . MULTIPLE EXTRACTIONS WITH ALVEOLOPLASTY N/A 04/05/2018   Procedure: Extraction of tooth #'s 3, 12, 14,19, 23, 31 and 32 with alveoloplasty and gross debridement of remaining teeth;  Surgeon: Lenn Cal, DDS;  Location: [facility];  Service: Oral Surgery;  Laterality: N/A;  . ORCHIECTOMY Left 1982  .  RIGHT HEART CATH AND CORONARY ANGIOGRAPHY N/A 03/28/2018   Procedure: RIGHT HEART CATH AND CORONARY ANGIOGRAPHY;  Surgeon: Larey Dresser, MD;  Location: [facility];  Service: Cardiovascular;  Laterality: N/A;  . TEE WITHOUT CARDIOVERSION N/A 04/10/2018   Procedure: TRANSESOPHAGEAL ECHOCARDIOGRAM (TEE);  Surgeon: Rexene Alberts, MD;  Location: [facility];  Service: Open Heart Surgery;   Laterality: N/A;  . VARICOSE VEIN SURGERY Left 05/1998     Current Outpatient Medications  Medication Sig Dispense Refill  . acetaminophen (TYLENOL) 500 MG tablet Take 1,000 mg by mouth every 6 (six) hours as needed for mild pain or headache.    . ALPRAZolam (XANAX) 0.5 MG tablet TAKE 1/2-1 TABLET BY MOUTH DAILY AT BEDTIME AS NEEDED FOR SLEEP AND LIMIT TO 5 DAYS PER WEEK TO AVOID ADDICTION 30 tablet 0  . aspirin EC 325 MG EC tablet Take 1 tablet (325 mg total) by mouth daily. 30 tablet 0  . buPROPion (WELLBUTRIN XL) 150 MG 24 hr tablet Take 1 tablet (150 mg total) by mouth every morning. 30 tablet 2  . busPIRone (BUSPAR) 10 MG tablet Take 1 tablet 3 x /day for Anxiety & Mood 270 tablet 1  . Cholecalciferol (VITAMIN D3) 125 MCG (5000 UT) TABS Take 5,000 Units by mouth daily.     . empagliflozin (JARDIANCE) 10 MG TABS tablet Take 10 mg by mouth daily.    Marland Kitchen eplerenone (INSPRA) 50 MG tablet Take 1 tablet (50 mg total) by mouth daily. 30 tablet 5  . furosemide (LASIX) 20 MG tablet Take 20 mg by mouth daily.    . metoprolol succinate (TOPROL-XL) 100 MG 24 hr tablet Take 1 tablet (100 mg total) by mouth in the morning and at bedtime. Take with or immediately following a meal. 60 tablet 4  . mexiletine (MEXITIL) 250 MG capsule Take 1 capsule (250 mg total) by mouth 2 (two) times daily. 60 capsule 3  . pantoprazole (PROTONIX) 40 MG tablet Take 1 tablet (40 mg total) by mouth daily. 30 tablet 11  . rosuvastatin (CRESTOR) 40 MG tablet 1/2-1 tablet daily for cholesterol 90 tablet 1  . sacubitril-valsartan (ENTRESTO) 49-51 MG Take 1 tablet by mouth 2 (two) times daily. 60 tablet 11  . sertraline (ZOLOFT) 100 MG tablet Take 1 tablet (100 mg total) by mouth daily. 90 tablet 3  . potassium chloride (KLOR-CON) 10 MEQ tablet Take 1 tablet (10 mEq total) by mouth daily. 90 tablet 3   No current facility-administered medications for this visit.    Allergies:   Patient has no known allergies.   Social  History:  The patient  reports that he quit smoking about 15 months ago. His smoking use included cigarettes. He quit after 30.00 years of use. He has never used smokeless tobacco. He reports previous alcohol use. He reports that he does not use drugs.   Family History:  The patient's family history includes Alzheimer's disease in his mother; Cancer in his father and mother; Diabetes in his mother; Hypertension in his father, mother, and sister.    ROS:  Please see the history of present illness.   Otherwise, review of systems is positive for none.   All other systems are reviewed and negative.   PHYSICAL EXAM: VS:  BP 104/76   Pulse 75   Ht 6\' 3"  (1.905 m)   Wt 253 lb (114.8 kg)   SpO2 95%   BMI 31.62 kg/m  , BMI Body mass index is 31.62 kg/m. GEN: Well  nourished, well developed, in no acute distress  HEENT: normal  Neck: no JVD, carotid bruits, or masses Cardiac: RRR; no murmurs, rubs, or gallops,no edema  Respiratory:  clear to auscultation bilaterally, normal work of breathing GI: soft, nontender, nondistended, + BS MS: no deformity or atrophy  Skin: warm and dry Neuro:  Strength and sensation are intact Psych: euthymic mood, full affect  EKG:  EKG is ordered today. Personal review of the ekg ordered shows sinus rhythm, rate 75, IVCD  Recent Labs: 03/01/2019: TSH 1.498 05/02/2019: ALT 27 06/03/2019: BUN 22; Creatinine, Ser 1.02; Potassium 3.9; Sodium 138    Lipid Panel     Component Value Date/Time   CHOL 174 04/01/2019 0936   TRIG 227 (H) 04/01/2019 0936   HDL 38 (L) 04/01/2019 0936   CHOLHDL 4.6 04/01/2019 0936   VLDL 15 03/28/2018 1859   LDLCALC 102 (H) 04/01/2019 0936     Wt Readings from Last 3 Encounters:  07/02/19 253 lb (114.8 kg)  06/03/19 250 lb 12.8 oz (113.8 kg)  05/02/19 253 lb (114.8 kg)      Other studies Reviewed: Additional studies/ records that were reviewed today include: TTE 10/09/18  Review of the above records today demonstrates:  1. The  left ventricle has mild-moderately reduced systolic function, with  an ejection fraction of 40-45%. The cavity size was normal. There is  mildly increased left ventricular wall thickness. Left ventricular  diastolic Doppler parameters are consistent  with impaired relaxation. Left ventricular diffuse hypokinesis.  Septal-lateral dyssynchrony consistent with LBBB.  2. There is mild mitral annular calcification present. No evidence of  mitral valve stenosis. No mitral regurgitation.  3. Bioprosthetic aortic valve. Mean gradient 12 mmHg. No significant  stenosis or regurgitation.  4. S/p surgical repair ascending aorta.  5. The right ventricle has normal systolic function. The cavity was  normal. There is no increase in right ventricular wall thickness.  6. Normal IVC size. PA systolic pressure 20 mmHg.  7. Trivial pericardial effusion is present.   Cardiac monitor 06/28/2019 personally reviewed 1. Predominantly NSR. 2. PVCs 3.4% of total beats.   ASSESSMENT AND PLAN:  1.  PVCs: 15.9% on Zio patch.  Amiodarone started and burden decreased to 3.4 %.  Due to his young age, he was switched to mexiletine.  Unfortunately he has multiple PVC morphologies which would make ablation quite difficult.  He also has a bioprosthetic aortic valve and thus would prefer to avoid crossing the valve.  His PVC burden is quite low and thus not a good candidate at this point on mexiletine.  No changes.  2.  Chronic systolic heart failure due to nonischemic cardiomyopathy: Ejection fraction 40 to 45%.  Currently on Entresto, Toprol-XL, eplerenone, Lasix.  No obvious volume overload.    3.  Bicuspid aortic valve with aortic stenosis severe: Status post surgical AVR with bioprosthetic valve.  Plan per primary cardiology.    4.  Coronary artery disease: Moderate nonobstructive disease on catheterization February 2020.  No current chest pain.  Current medicines are reviewed at length with the patient today.    The patient does not have concerns regarding his medicines.  The following changes were made today: none  Labs/ tests ordered today include:  No orders of the defined types were placed in this encounter.    Disposition:   FU with Tyler Camnitz 6 months  Signed, Tyler Meredith Leeds, MD  07/02/2019 3:42 PM     [facility] [address]  [place] [address] [phone] (office) [phone] (fax)

## 2019-07-02 NOTE — Patient Instructions (Signed)
Medication Instructions:  Your physician recommends that you continue on your current medications as directed. Please refer to the Current Medication list given to you today.  *If you need a refill on your cardiac medications before your next appointment, please call your pharmacy*   Lab Work: None ordered If you have labs (blood work) drawn today and your tests are completely normal, you will receive your results only by: . MyChart Message (if you have MyChart) OR . A paper copy in the mail If you have any lab test that is abnormal or we need to change your treatment, we will call you to review the results.   Testing/Procedures: None ordered   Follow-Up: At [facility], you and your health needs are our priority.  As part of our continuing mission to provide you with exceptional heart care, we have created designated Provider Care Teams.  These Care Teams include your primary Cardiologist (physician) and Advanced Practice Providers (APPs -  Physician Assistants and Nurse Practitioners) who all work together to provide you with the care you need, when you need it.  We recommend signing up for the patient portal called "MyChart".  Sign up information is provided on this After Visit Summary.  MyChart is used to connect with patients for Virtual Visits (Telemedicine).  Patients are able to view lab/test results, encounter notes, upcoming appointments, etc.  Non-urgent messages can be sent to your provider as well.   To learn more about what you can do with MyChart, go to [email or web address].    Your next appointment:   6 month(s)  The format for your next appointment:   In Person  Provider:   Will Camnitz, MD   Thank you for choosing [facility]!!   Surya Folden, RN [phone]    Other Instructions    

## 2019-07-03 NOTE — Addendum Note (Signed)
Addended by: Rose Phi on: 07/03/2019 11:42 AM   Modules accepted: Orders

## 2019-07-10 ENCOUNTER — Telehealth (HOSPITAL_COMMUNITY): Payer: Self-pay | Admitting: Pharmacy Technician

## 2019-07-10 NOTE — Telephone Encounter (Signed)
Spoke to patient regarding re-enrollment of Entresto with [employer]. Will email the application for the patient to sign.   Will follow up.

## 2019-07-20 ENCOUNTER — Other Ambulatory Visit: Payer: Self-pay | Admitting: Cardiology

## 2019-07-22 MED FILL — METOPROLOL SUCCINATE ER 100: 100 | 30 days supply | Qty: 60 | Fill #1

## 2019-07-23 NOTE — Telephone Encounter (Signed)
Have yet to receive patient's assistance application. Sent patient follow up e-mail.

## 2019-07-26 ENCOUNTER — Other Ambulatory Visit: Payer: Self-pay | Admitting: Internal Medicine

## 2019-07-26 DIAGNOSIS — I10 Essential (primary) hypertension: Secondary | ICD-10-CM

## 2019-08-07 NOTE — Telephone Encounter (Signed)
Spoke with patient, will mail application to him.  Will follow up.

## 2019-08-19 ENCOUNTER — Other Ambulatory Visit: Payer: Self-pay | Admitting: Physician Assistant

## 2019-08-20 MED FILL — METOPROLOL SUCCINATE ER 100: 100 | 30 days supply | Qty: 60 | Fill #2

## 2019-08-26 NOTE — Telephone Encounter (Signed)
Sent in application via fax.  Will follow up.  

## 2019-08-28 NOTE — Telephone Encounter (Signed)
Advanced Heart Failure Patient Advocate Encounter   Patient was approved to receive Entresto from [employer].  Patient ID: [id number] Effective dates: 08/27/19 through 08/26/20  Patient is aware.  Tyler Berger

## 2019-09-03 ENCOUNTER — Other Ambulatory Visit: Payer: Self-pay

## 2019-09-03 ENCOUNTER — Ambulatory Visit (HOSPITAL_COMMUNITY)
Admission: RE | Admit: 2019-09-03 | Discharge: 2019-09-03 | Disposition: A | Discharge status: Home / Self Care | Payer: Self-pay | Source: Ambulatory Visit | Attending: Cardiology | Admitting: Cardiology

## 2019-09-03 ENCOUNTER — Encounter (HOSPITAL_COMMUNITY): Payer: Self-pay | Admitting: Cardiology

## 2019-09-03 VITALS — BP 120/90 | HR 85 | Wt 258.0 lb

## 2019-09-03 DIAGNOSIS — I428 Other cardiomyopathies: Secondary | ICD-10-CM | POA: Insufficient documentation

## 2019-09-03 DIAGNOSIS — I447 Left bundle-branch block, unspecified: Secondary | ICD-10-CM | POA: Insufficient documentation

## 2019-09-03 DIAGNOSIS — R569 Unspecified convulsions: Secondary | ICD-10-CM | POA: Insufficient documentation

## 2019-09-03 DIAGNOSIS — I2510 Atherosclerotic heart disease of native coronary artery without angina pectoris: Secondary | ICD-10-CM | POA: Insufficient documentation

## 2019-09-03 DIAGNOSIS — N62 Hypertrophy of breast: Secondary | ICD-10-CM | POA: Insufficient documentation

## 2019-09-03 DIAGNOSIS — Z953 Presence of xenogenic heart valve: Secondary | ICD-10-CM | POA: Insufficient documentation

## 2019-09-03 DIAGNOSIS — I2721 Secondary pulmonary arterial hypertension: Secondary | ICD-10-CM | POA: Insufficient documentation

## 2019-09-03 DIAGNOSIS — Z87891 Personal history of nicotine dependence: Secondary | ICD-10-CM | POA: Insufficient documentation

## 2019-09-03 DIAGNOSIS — Z7982 Long term (current) use of aspirin: Secondary | ICD-10-CM | POA: Insufficient documentation

## 2019-09-03 DIAGNOSIS — Q231 Congenital insufficiency of aortic valve: Secondary | ICD-10-CM | POA: Insufficient documentation

## 2019-09-03 DIAGNOSIS — Z79899 Other long term (current) drug therapy: Secondary | ICD-10-CM | POA: Insufficient documentation

## 2019-09-03 DIAGNOSIS — I493 Ventricular premature depolarization: Secondary | ICD-10-CM | POA: Insufficient documentation

## 2019-09-03 DIAGNOSIS — I5022 Chronic systolic (congestive) heart failure: Secondary | ICD-10-CM | POA: Insufficient documentation

## 2019-09-03 DIAGNOSIS — Z8249 Family history of ischemic heart disease and other diseases of the circulatory system: Secondary | ICD-10-CM | POA: Insufficient documentation

## 2019-09-03 DIAGNOSIS — I5042 Chronic combined systolic (congestive) and diastolic (congestive) heart failure: Secondary | ICD-10-CM

## 2019-09-03 DIAGNOSIS — I110 Hypertensive heart disease with heart failure: Secondary | ICD-10-CM | POA: Insufficient documentation

## 2019-09-03 LAB — LIPID PANEL
Cholesterol: 185 mg/dL (ref 0–200)
HDL: 55 mg/dL (ref >40)
LDL Cholesterol: 109 mg/dL — ABNORMAL HIGH (ref 0–99)
Total CHOL/HDL Ratio: 3.4 RATIO
Triglycerides: 106 mg/dL (ref <150)
VLDL: 21 mg/dL (ref 0–40)

## 2019-09-03 LAB — BASIC METABOLIC PANEL
Anion gap: 12 (ref 5–15)
BUN: 16 mg/dL (ref 6–20)
CO2: 25 mmol/L (ref 22–32)
Calcium: 9.6 mg/dL (ref 8.9–10.3)
Chloride: 102 mmol/L (ref 98–111)
Creatinine, Ser: 0.92 mg/dL (ref 0.61–1.24)
GFR calc Af Amer: >60 mL/min (ref >60)
GFR calc non Af Amer: >60 mL/min (ref >60)
Glucose, Bld: 148 mg/dL — ABNORMAL HIGH (ref 70–99)
Potassium: 4.5 mmol/L (ref 3.5–5.1)
Sodium: 139 mmol/L (ref 135–145)

## 2019-09-03 MED ORDER — SACUBITRIL-VALSARTAN 97-103 MG PO TABS: 1.0000 | ORAL_TABLET | Freq: Two times a day (BID) | ORAL | 3 refills | Status: DC

## 2019-09-03 NOTE — Progress Notes (Signed)
PCP: Unk Pinto, MD HF Cardiology: Dr Aundra Dubin   HPI: 59 y.o. with history of cardiomyopathy and aortic stenosis returns for followup in CHF.  In 2004, he was diagnosed with nonischemic cardiomyopathy, EF 20% at that time.  His EF recovered back up to 60-65%, but he was noted to have moderate aortic stenosis with a bicuspid valve. He was not seen by cardiology for a number of years.   He was in the ER in 1/20 with SVT that converted spontaneously.  He was set up for an echo and sent back to cardiology.  Echo showed EF 25-30%, diffuse hypokinesis, probably severe aortic stenosis with bicuspid aortic valve.  He was referred for RHC/LHC.    RHC/LHC was done 2/20.  He had moderate coronary disease but no critical stenoses and no disease to explain his cardiomyopathy.  RHC showed markedly elevated PCWP, mixed pulmonary venous/pulmonary arterial hypertension, and low CI at 1.7.  I decided to admit him for diuresis, optimization of meds, and further workup of aortic stenosis.  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  He was evaluated by the structural heart team but valve area thought to be too big for the existing TAVR valves.  Therefore, surgical AVR was recommended.   On 04/10/18 he underwent bioprosthetic AVR with supracoronary ascending aorta replacement  by Dr Roxy Manns. He was placed on amiodarone due to increased PVCs post-op. Discharge weight was 224 pounds.  Echo in 8/20 showed EF 40-45% with mild LVH, septal-lateral dyssynchrony, normal bioprosthetic aortic valve.  Zio patch in 12/20 showed 15.9% PVCs and NSVT runs.  Amiodarone was started. He saw Dr. Curt Bears and amiodarone was stopped, mexiletine was started.  Zio patch in 4/21 showed PVC burden down to 3.4%.   He has been stable generally.  He did have a seizure at [place], thought to be related to Wellbutrin and this medication was stopped.  Generally not dyspnea walking on flat ground.  Not very active.  Weight up 8 lbs.   No orthopnea/PND.  No chest pain.  Rare lightheadedness.    Labs (8/20): LDL 69, HDL 49, K 3.8, creatinine 1.22  Labs (9/20): K 4.2, creatinine 1.08 Labs (12/20): K 3.4, creatinine 1.22  Labs (3/21): K 4.4, creatinine 1.24  PMH: 1. SVT 2. HTN 3. Prior ETOH abuse 4. Prior smoking 5. Chronic systolic CHF: Nonischemic cardiomyopathy.  - Echo (2004) with EF 20%.  - Echo (1/20): EF 25-30%, mild LVH, moderate LV dilation, bicuspid aortic valve with severe AS.  - RHC (2/20): mean RA 12, PA 70/32 mean 47, mean PCWP 30, CI 1.72, PVR 4.2.  - Echo (9/20): EF 40-45%, mild LVH, septal-lateral dyssynchrony, normal RV size and systolic function, bioprosthetic AVR functions normally.  6. LBBB 7. CAD: LHC (2/20) witih 80% stenosis small D1, 50% stenosis moderate D2, 50% mid LCx, 50% distal RCA.  8. Biscuspid aortic valve disorder: Severe AS and dilated ascending aorta.   - 3/20 bioprosthetic AVR + supracoronary ascending aorta replacement.  9. PVCs: Zio patch 12/20 showed 15.9% PVCs, NSVT runs.  - Zio patch (4/21): 3.4% PVCs, multiple morphologies.   ROS: All systems negative except as listed in HPI, PMH and Problem List.  SH:  Social History   Socioeconomic History  . Marital status: Married    Spouse name: Not on file  . Number of children: 2  . Years of education: Not on file  . Highest education level: Not on file  Occupational History  . 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 Smoking status: Former Smoker    Years: 30.00    Types: Cigarettes    Quit date: 03/28/2018    Years since quitting: 1.4  . Smokeless tobacco: Never Used  Vaping Use  . Vaping Use: Never used  Substance and Sexual Activity  . Alcohol use: Not Currently  . Drug use: Never  . Sexual activity: Not on file  Other Topics Concern  . Not on file  Social History Narrative   Married, med Designer, multimedia.  Two children.     Social Determinants of Health   Financial Resource Strain: Medium Risk  .  Difficulty of Paying Living Expenses: Somewhat hard  Food Insecurity: No Food Insecurity  . Worried About Charity fundraiser in the Last Year: Never true  . Ran Out of Food in the Last Year: Never true  Transportation Needs: No Transportation Needs  . Lack of Transportation (Medical): No  . Lack of Transportation (Non-Medical): No  Physical Activity:   . Days of Exercise per Week:   . Minutes of Exercise per Session:   Stress:   . Feeling of Stress :   Social Connections:   . Frequency of Communication with Friends and Family:   . Frequency of Social Gatherings with Friends and Family:   . Attends Religious Services:   . Active Member of Clubs or Organizations:   . Attends Archivist Meetings:   Marland Kitchen Marital Status:   Intimate Partner Violence:   . Fear of Current or Ex-Partner:   . Emotionally Abused:   Marland Kitchen Physically Abused:   . Sexually Abused:     FH:  Family History  Problem Relation Age of Onset  . Diabetes Mother   . Hypertension Mother   . Cancer Mother        breast  . Alzheimer's disease Mother   . Hypertension Father   . Cancer Father        head and neck  . Hypertension Sister      Current Outpatient Medications  Medication Sig Dispense Refill  . acetaminophen (TYLENOL) 500 MG tablet Take 1,000 mg by mouth every 6 (six) hours as needed for mild pain or headache.    . ALPRAZolam (XANAX) 0.5 MG tablet TAKE 1/2 TO 1 (ONE-HALF TO ONE) TABLET BY MOUTH EVERY DAY AT BEDTIME AS NEEDED FOR SLEEP AND LIMIT TO 5 DAYS PER WEEK TO AVOID ADDICTION. 30 tablet 0  . aspirin EC 81 MG tablet Take 81 mg by mouth daily. Swallow whole.    . busPIRone (BUSPAR) 10 MG tablet Take 1 tablet 3 x /day as needed for Anxiety & Mood 270 tablet 0  . Cholecalciferol (VITAMIN D3) 125 MCG (5000 UT) TABS Take 5,000 Units by mouth daily.     . empagliflozin (JARDIANCE) 10 MG TABS tablet Take 10 mg by mouth daily.    Marland Kitchen eplerenone (INSPRA) 50 MG tablet Take 1 tablet (50 mg total) by mouth  daily. 30 tablet 5  . furosemide (LASIX) 20 MG tablet Take 20 mg by mouth daily.    . metoprolol succinate (TOPROL-XL) 100 MG 24 hr tablet Take 1 tablet (100 mg total) by mouth in the morning and at bedtime. Take with or immediately following a meal. 60 tablet 4  . mexiletine (MEXITIL) 250 MG capsule Take 1 capsule (250 mg total) by mouth 2 (two) times daily. 60 capsule 8  . pantoprazole (PROTONIX) 40 MG tablet Take 1 tablet (40 mg total) by mouth daily. [place]  tablet 11  . potassium chloride (KLOR-CON) 10 MEQ tablet Take 1 tablet (10 mEq total) by mouth daily. 90 tablet 3  . rosuvastatin (CRESTOR) 20 MG tablet Take 20 mg by mouth daily.    . sertraline (ZOLOFT) 100 MG tablet Take 1 tablet (100 mg total) by mouth daily. 90 tablet 3  . sacubitril-valsartan (ENTRESTO) 97-103 MG Take 1 tablet by mouth 2 (two) times daily. 180 tablet 3   No current facility-administered medications for this encounter.    Vitals:   09/03/19 0907  BP: (!) 120/90  Pulse: 85  SpO2: 97%  Weight: (!) 117 kg (258 lb)   Wt Readings from Last 3 Encounters:  09/03/19 (!) 117 kg (258 lb)  07/02/19 114.8 kg (253 lb)  06/03/19 113.8 kg (250 lb 12.8 oz)    PHYSICAL EXAM: General: NAD Neck: No JVD, no thyromegaly or thyroid nodule.  Lungs: Clear to auscultation bilaterally with normal respiratory effort. CV: Nondisplaced PMI.  Heart regular S1/S2, no S3/S4, 1/6 SEM RUSB.  No peripheral edema.  No carotid bruit.  Normal pedal pulses.  Abdomen: Soft, nontender, no hepatosplenomegaly, no distention.  Skin: Intact without lesions or rashes.  Neurologic: Alert and oriented x 3.  Psych: Normal affect. Extremities: No clubbing or cyanosis.  HEENT: Normal.   ASSESSMENT & PLAN: 1. Chronic systolic CHF: Nonischemic cardiomyopathy. This pre-existed severe AS, ?if PVCs play a role.  Echo in 9/20 showed EF up to 40-45%.  He is not volume overloaded on exam, NYHA class II.  - Increase Entresto to 97/103 bid. BMET today and in 10  days.   - Toprol XL 100 mg bid.   - Continue eplerenone 50 mg daily (painful gynecomastia with spironolactone, now resolved).   - Continue Lasix 20 mg daily.  Continue KCl 10 daily.    - Echo at followup in 3 months.  EF has been out of ICD range.  If EF were to fall in the future, would be CRT candidate with LBBB.  2. Bicuspid aortic valve disorder: Severe AS, now s/p surgical AVR (annulus too large for TAVR) and ascending aorta replacement.  Echo in 9/20 showed a normally functioning bioprosthetic aortic valve.  - He will need endocarditis prophylaxis with dental work.  3. ETOH abuse: Rarely drinks alcohol now.  4. PVCs: 15.9% PVCs on 12/20 Zio patch (symptomatic).  Amiodarone started with improvement.  To prevent long-term amiodarone use, he was transitioned to mexiletine. Repeat Zio patch in 4/21 showed PVC burden down to 3.4%.  Multiple morphologies of PVCs, would not be amenable to ablation.   - Continue mexiletine.  5. CAD: Moderate nonobstructive CAD on 2/20 cath.  No chest pain.  I do not think that this contributed to his cardiomyopathy appreciably.  - Continue Crestor, check lipids today.  - Continue ASA.   6. LBBB: Chronic.   Follow up in 3 months with echo.  Social worker to help with disability.   Loralie Champagne 09/03/2019

## 2019-09-03 NOTE — Progress Notes (Signed)
Heart and Vascular Care Navigation  09/03/2019  Tyler Berger 03-02-1960 [id number]  Reason for Referral: Asked to speak with pt regarding current lack of insurance.                                                                                                    Assessment:  CSW spoke with pt regarding current lack of insurance.  Pt is currently awaiting disability determination- was denied earlier this year but now has a lawyer involved who is helping appeal the decision- no recent updates regarding this process.  Pt currently lives with his spouse who supports him financially.  His spouse works as a Emergency planning/management officer but only makes about $1,200/month.  This is not enough to pay for [facility] insurance at this time.  Pt was confused about if he would get Medicaid automatically if he gets disability.  CSW explained that since he worked consistently prior to becoming disabled he would not automatically get Medicaid (only get it automatically with SSI).  CSW provided pt with application for CAFA, [employer], and [place].  Discussed him applying for Medicaid regardless in case he would qualify based off his disability income- seems as if his is under reserve limit so as long as his SSDI is low enough he could still qualify.  Discussed CAFA as a way for him to get some past and future [facility] bills covered and [employer] as a way for him to see community providers for reasonable copay.                                    HRT/VAS Care Coordination    Patients Home Cardiology Office Heart Failure Clinic   Outpatient Care Team Social Worker   Social Worker Name: Tammy Sours- [facility]- [phone]   Living arrangements for the past 2 months Single Family Home   Lives with: Spouse   Patient Current Insurance Coverage Self-Pay   Patient Has Concern With Paying Medical Bills Yes   Patient Concerns With Medical Bills no insurance and outstanding charges   Medical Bill Referrals: [facility],  [place] applications given   Does Patient Have Prescription Coverage? No   Patient Prescription Assistance Programs Patient Assistance Programs; Heart Failure Fund   Patient Assistance Programs Medications [employer] for [employer] approved for 08/27/19-08/26/20   Home Assistive Devices/Equipment Eyeglasses      Social History:                                                                             SDOH Screenings   Alcohol Screen:    Last Alcohol Screening Score (AUDIT):   Depression (PHQ2-9):    PHQ-2 Score:   Financial Resource Strain: Medium Risk  Difficulty of Paying Living Expenses: Somewhat hard  Food Insecurity: No Food Insecurity   Worried About Charity fundraiser in the Last Year: Never true   Ran Out of Food in the Last Year: Never true  Housing: Low Risk    Last Housing Risk Score: 0  Physical Activity:    Days of Exercise per Week:    Minutes of Exercise per Session:   Social Connections:    Frequency of Communication with Friends and Family:    Frequency of Social Gatherings with Friends and Family:    Attends Religious Services:    Active Member of Clubs or Organizations:    Attends Music therapist:    Marital Status:   Stress:    Feeling of Stress :   Tobacco Use: Medium Risk   Smoking Tobacco Use: Former Smoker   Smokeless Tobacco Use: Never Used  Transportation Needs: No Data processing manager (Medical): No   Lack of Transportation (Non-Medical): No    SDOH Interventions: Financial Resources:  Financial Strain Interventions: Intervention Not Indicated (limited income from spouse but reports they have been able to manage even though its hard)   Food Insecurity:  Food Insecurity Interventions:  (receiving $81 in food stamps)  Housing Insecurity:  Housing Interventions: Intervention Not Indicated  Transportation:   Transportation Interventions: Intervention Not Indicated    Other Care Navigation  Interventions:     Provided Pharmacy assistance resources Patient Assistance Programs, [place] until 08/26/20   Follow-up plan:    Pt to complete Medicaid, CAFA, and [employer] to assist with medical bills.  [name], LCSW Clinical Social Worker [facility] Desk#: [phone] Cell#: [phone]

## 2019-09-03 NOTE — Patient Instructions (Signed)
You have been seen by Social Work to assist with insurance.    INCREASE Entresto 97/103mg  (1 tab) taice a day    Labs today and repeat in 10 days We will only contact you if something comes back abnormal or we need to make some changes. Otherwise no news is good news!   Your physician has requested that you have an echocardiogram. Echocardiography is a painless test that uses sound waves to create images of your heart. It provides your doctor with information about the size and shape of your heart and how well your hearts chambers and valves are working. This procedure takes approximately one hour. There are no restrictions for this procedure.   Your physician recommends that you schedule a follow-up appointment in: 3 months with an ECHO and Dr Aundra Dubin.     Please call office at [phone] option [phone] if you have any questions or concerns.     At the [facility], you and your health needs are our priority. As part of our continuing mission to provide you with exceptional heart care, we have created designated Provider Care Teams. These Care Teams include your primary Cardiologist (physician) and Advanced Practice Providers (APPs- Physician Assistants and Nurse Practitioners) who all work together to provide you with the care you need, when you need it.   You may see any of the following providers on your designated Care Team at your next follow up:  Dr Glori Bickers  Dr Haynes Kerns, NP  Lyda Jester, [place]  Audry Riles, PharmD   Please be sure to bring in all your medications bottles to every appointment.

## 2019-09-04 ENCOUNTER — Telehealth (HOSPITAL_COMMUNITY): Payer: Self-pay

## 2019-09-04 DIAGNOSIS — I5042 Chronic combined systolic (congestive) and diastolic (congestive) heart failure: Secondary | ICD-10-CM

## 2019-09-04 DIAGNOSIS — E785 Hyperlipidemia, unspecified: Secondary | ICD-10-CM

## 2019-09-04 MED ORDER — ROSUVASTATIN CALCIUM 40 MG PO TABS: 40.0000 mg | ORAL_TABLET | Freq: Every day | ORAL | 3 refills | Status: DC

## 2019-09-04 NOTE — Telephone Encounter (Signed)
Patient advised and verbalized understanding. Lab order entered,new rx sent into pharmacy   Orders Placed This Encounter  Procedures  . Lipid Profile    Standing Status:   Future    Standing Expiration Date:   09/03/2020    Order Specific Question:   Release to patient    Answer:   Immediate  . Hepatic function panel    Standing Status:   Future    Standing Expiration Date:   09/03/2020    Order Specific Question:   Release to patient    Answer:   Immediate    Meds ordered this encounter  Medications  . rosuvastatin (CRESTOR) 40 MG tablet    Sig: Take 1 tablet (40 mg total) by mouth daily.    Dispense:  90 tablet    Refill:  3    Please cancel all previous orders for current medication. Change in dosage or pill size.

## 2019-09-04 NOTE — Telephone Encounter (Signed)
-----   Message from Larey Dresser, MD sent at 09/03/2019  4:26 PM EDT ----- Increase Crestor to 40 mg daily with lipids/LFTs in 2 months.

## 2019-09-05 ENCOUNTER — Telehealth: Payer: Self-pay | Admitting: Physician Assistant

## 2019-09-05 ENCOUNTER — Other Ambulatory Visit (HOSPITAL_COMMUNITY): Payer: Self-pay | Admitting: Cardiology

## 2019-09-05 ENCOUNTER — Telehealth (HOSPITAL_COMMUNITY): Payer: Self-pay | Admitting: Licensed Clinical Social Worker

## 2019-09-05 MED ORDER — EMPAGLIFLOZIN 10 MG PO TABS: 10.0000 mg | ORAL_TABLET | Freq: Every day | ORAL | 3 refills | Status: DC

## 2019-09-05 MED FILL — JARDIANCE 10 MG TABLET: 10 | 30 days supply | Qty: 30 | Fill #0

## 2019-09-05 NOTE — Telephone Encounter (Signed)
CSW received call from pt with concerns about his jardiance running out.  Reports he had been receiving samples through his PCP office for this medication as it is too expensive for him without insurance- he has a message left at their office currently but is worried about not having medication for tomorrow.  Medication is available through the HF fund so CSW requested clinic staff send in to outpatient pharmacy under this fund- pt informed and will pick up  [name], [facility] Desk#: [phone] Cell#: [phone]

## 2019-09-05 NOTE — Telephone Encounter (Signed)
Put samples up front for the patient.

## 2019-09-05 NOTE — Telephone Encounter (Signed)
patient called to request samples of Jardiance, he has no insurance and not afford. Patient states he has run out of the samples you previously gave him. Please advise your recommendation

## 2019-09-09 ENCOUNTER — Other Ambulatory Visit (HOSPITAL_COMMUNITY): Payer: Self-pay | Admitting: Cardiology

## 2019-09-16 ENCOUNTER — Ambulatory Visit (HOSPITAL_COMMUNITY)
Admission: RE | Admit: 2019-09-16 | Discharge: 2019-09-16 | Disposition: A | Discharge status: Home / Self Care | Payer: Self-pay | Source: Ambulatory Visit | Attending: Cardiology | Admitting: Cardiology

## 2019-09-16 ENCOUNTER — Other Ambulatory Visit: Payer: Self-pay

## 2019-09-16 DIAGNOSIS — E785 Hyperlipidemia, unspecified: Secondary | ICD-10-CM | POA: Insufficient documentation

## 2019-09-16 LAB — LIPID PANEL
Cholesterol: 152 mg/dL (ref 0–200)
HDL: 50 mg/dL (ref >40)
LDL Cholesterol: 84 mg/dL (ref 0–99)
Total CHOL/HDL Ratio: 3 RATIO
Triglycerides: 88 mg/dL (ref <150)
VLDL: 18 mg/dL (ref 0–40)

## 2019-09-16 LAB — HEPATIC FUNCTION PANEL
ALT: 31 U/L (ref 0–44)
AST: 29 U/L (ref 15–41)
Albumin: 4.3 g/dL (ref 3.5–5.0)
Alkaline Phosphatase: 63 U/L (ref 38–126)
Bilirubin, Direct: 0.1 mg/dL (ref 0.0–0.2)
Indirect Bilirubin: 0.6 mg/dL (ref 0.3–0.9)
Total Bilirubin: 0.7 mg/dL (ref 0.3–1.2)
Total Protein: 7.3 g/dL (ref 6.5–8.1)

## 2019-09-19 ENCOUNTER — Telehealth (HOSPITAL_COMMUNITY): Payer: Self-pay | Admitting: Licensed Clinical Social Worker

## 2019-09-19 NOTE — Telephone Encounter (Signed)
CSW called to check in regarding pt progress with Medicaid, CAFA, and [employer].  Unable to reach- left VM requesting return call  [name], [facility] Desk#: [phone] Cell#: [phone]

## 2019-09-20 MED FILL — METOPROLOL SUCCINATE ER 100: 100 | 30 days supply | Qty: 60 | Fill #3

## 2019-09-30 ENCOUNTER — Telehealth (HOSPITAL_COMMUNITY): Payer: Self-pay | Admitting: Cardiology

## 2019-10-01 ENCOUNTER — Other Ambulatory Visit: Payer: Self-pay

## 2019-10-01 ENCOUNTER — Ambulatory Visit (HOSPITAL_COMMUNITY)
Admission: RE | Admit: 2019-10-01 | Discharge: 2019-10-01 | Disposition: A | Discharge status: Home / Self Care | Payer: Self-pay | Source: Ambulatory Visit | Attending: Internal Medicine | Admitting: Internal Medicine

## 2019-10-01 ENCOUNTER — Telehealth (HOSPITAL_COMMUNITY): Payer: Self-pay | Admitting: Licensed Clinical Social Worker

## 2019-10-01 ENCOUNTER — Encounter: Payer: Self-pay | Admitting: Adult Health

## 2019-10-01 ENCOUNTER — Encounter: Payer: Managed Care, Other (non HMO) | Admitting: Physician Assistant

## 2019-10-01 DIAGNOSIS — I5042 Chronic combined systolic (congestive) and diastolic (congestive) heart failure: Secondary | ICD-10-CM | POA: Insufficient documentation

## 2019-10-01 LAB — BASIC METABOLIC PANEL
Anion gap: 13 (ref 5–15)
BUN: 13 mg/dL (ref 6–20)
CO2: 23 mmol/L (ref 22–32)
Calcium: 9.5 mg/dL (ref 8.9–10.3)
Chloride: 103 mmol/L (ref 98–111)
Creatinine, Ser: 0.93 mg/dL (ref 0.61–1.24)
GFR calc Af Amer: >60 mL/min (ref >60)
GFR calc non Af Amer: >60 mL/min (ref >60)
Glucose, Bld: 148 mg/dL — ABNORMAL HIGH (ref 70–99)
Potassium: 4.6 mmol/L (ref 3.5–5.1)
Sodium: 139 mmol/L (ref 135–145)

## 2019-10-01 NOTE — Telephone Encounter (Addendum)
CSW called to check in regarding pt progress with Medicaid, CAFA, and [employer].  Pt reports he is hopeful to have everything together for those this week and then will contact CSW to help him turn everything in.  Will continue to follow and assist as needed  Tyler Berger, [facility] Desk#: [phone] Cell#: [phone]

## 2019-10-02 NOTE — Telephone Encounter (Signed)
error 

## 2019-10-03 ENCOUNTER — Other Ambulatory Visit: Payer: Self-pay | Admitting: Internal Medicine

## 2019-10-03 ENCOUNTER — Other Ambulatory Visit: Payer: Self-pay | Admitting: Adult Health

## 2019-10-03 ENCOUNTER — Other Ambulatory Visit (HOSPITAL_COMMUNITY): Payer: Self-pay | Admitting: Cardiology

## 2019-10-03 DIAGNOSIS — I10 Essential (primary) hypertension: Secondary | ICD-10-CM

## 2019-10-09 ENCOUNTER — Telehealth (HOSPITAL_COMMUNITY): Payer: Self-pay | Admitting: Licensed Clinical Social Worker

## 2019-10-09 NOTE — Telephone Encounter (Signed)
Pt had provided CSW with applications for CAFA, Medicaid, and [employer].  CSW reviewed applications and supporting documentation to determine if there is anything missing.  Pt will need to provide utility bills with either his name or his husbands name on it and their address, mortgage statement with address on it, and copy of his ID- patient informed.  He will try to gather these documents and bring to [place] by the end of this week  Will continue to follow and assist as needed  [name], [facility] Desk#: [phone] Cell#: [phone]

## 2019-10-11 ENCOUNTER — Telehealth (HOSPITAL_COMMUNITY): Payer: Self-pay | Admitting: Licensed Clinical Social Worker

## 2019-10-11 NOTE — Telephone Encounter (Signed)
Pt brought in remaining paperwork for applications.  CSW faxed in [place] and [employer] and mailed CAFA application.  Will continue to follow and assist as able  [name], [facility] Desk#: [phone] Cell#: [phone]

## 2019-10-18 MED FILL — METOPROLOL SUCCINATE ER 100: 100 | 30 days supply | Qty: 60 | Fill #4

## 2019-10-21 DIAGNOSIS — E669 Obesity, unspecified: Secondary | ICD-10-CM | POA: Insufficient documentation

## 2019-10-21 DIAGNOSIS — E66811 Obesity, class 1: Secondary | ICD-10-CM | POA: Insufficient documentation

## 2019-10-21 NOTE — Progress Notes (Deleted)
Patient ID: Tyler Berger, male   DOB: Apr 17, 1960, 59 y.o.   MRN: [id number] COMPLETE PHYSICAL  Assessment and Plan:   Encounter for general adult medical examination with abnormal findings Patient does not have insurance- will do minimal labs *** Is due for colonoscopy as well, no symptoms at this time. ***  Essential hypertension Continue medications Monitor blood pressure at home; call if consistently over 130/80 Continue DASH diet.   Reminder to go to the ER if any CP, SOB, nausea, dizziness, severe HA, changes vision/speech, left arm numbness and tingling and jaw pain.  Hyperlipidemia, unspecified hyperlipidemia type -     Lipid panel check lipids decrease fatty foods increase activity.   Chronic combined systolic and diastolic congestive heart failure [facility]) May try to maximize medications/blood pressure LONG discussion about sodium intact/sugar intact and maximizing medications to help his heart not work as hard. *** Goal weight is likely closer to 235-240 *** Weight daily and bring log of weight and BP to heart failure clinic  Dilated aortic root (HCC) S/p repair  Non-ischemic cardiomyopathy ([place]) Try to maximize medications/blood pressure LONG discussion about sodium intact/sugar intact and maximizing medications to help his heart not work as hard.  Goal weight is likely closer to 235-240 Weight daily and bring log of weight and BP to heart failure clinic  SVT (supraventricular tachycardia) (HCC) NSR at this time, remains controlled with meds; cardiology following   Tobacco abuse Quit smoking, congratulated the patient and encouraged him to continue. ***  S/P aortic valve replacement with bioprosthetic valve + resection ascending thoracic aortic aneurysm Continue follow up  S/P ascending aortic aneurysm repair Continue follow up  Anxiety Continue zoloft and xanax AS needed  Vitamin D deficiency Get on medication  PVC (premature ventricular contraction) NSR  at this time, hopeful that he is able to get off the amiodarone pending his next evaluation with heart failure clinic  Abnormal glucose -     Hemoglobin A1c      Continue diet and meds as discussed. Further disposition pending results of labs. Future Appointments  Date Time Provider [place]  10/22/2019  2:00 PM Liane Comber, NP [facility] None  12/04/2019  9:00 AM MC ECHO OP 1 [facility]  12/04/2019 10:00 AM Larey Dresser, MD [facility] None  10/21/2020  2:00 PM Liane Comber, NP [facility] None    HPI 59 y.o. male  presents for CPE. He has Hyperlipidemia; Hypertension; Anxiety; Vitamin D deficiency; Medication management; Varicose veins of both lower extremities; LBBB (left bundle branch block); SVT (supraventricular tachycardia) ([place]); Tobacco abuse; Non-ischemic cardiomyopathy ([place]); Chronic combined systolic and diastolic congestive heart failure (HCC); S/P aortic valve replacement with bioprosthetic valve + resection ascending thoracic aortic aneurysm; S/P ascending aortic aneurysm repair; PVC (premature ventricular contraction); Chest pain of uncertain etiology; Coronary artery disease involving native coronary artery of native heart without angina pectoris; and Obesity (BMI 30.0-34.9) on their problem list.  ***  Patient is s/p aortic valve replacement with biosynthetic valve and thoracic aortic aneurysm repair on 73/22/0254, has systolic heart failure with post op TEE showing EF 35-40%.  He was placed on amiodarone due to PVCs with improvement, then transitioned to mexiletine. Following with Dr. Aundra Dubin at the heart failure clinic. Echo 10/2018 showed EF up to 40-45%, normally functioning valve, NYHA class II. Mod nonobstructive CAD per 03/2018 cath.    Former smoker *** update problem list ***  he has never had a colonoscopy due to uninsured/cost, had AB CTA 03/2018 that was  negative for cancer/masses at that time, also normal appearing prostate ***  *** He was  tearful and frustrated last visit, it had been a year since his first hospitalization and he is not feeling better, still limited with exercise and fatigue. He was denied disability but very grateful for the help from [facility], appealing disability application, pulm is handling ***  He is very sad, he lost his best friends 3 weeks ago to massive MI.  He is on zoloft 50 mg and wellbutrin 150XL which he states is helping with motivation and mood. Denies seritonin syndrome symptoms.     BMI is There is no height or weight on file to calculate BMI., he is working on diet and exercise. Wt Readings from Last 3 Encounters:  09/03/19 (!) 258 lb (117 kg)  07/02/19 253 lb (114.8 kg)  06/03/19 250 lb 12.8 oz (113.8 kg)    He has had some depression, will take 1/2 of xanax before bed.  Lab Results  Component Value Date   GFRNONAA >60 10/01/2019   Lab Results  Component Value Date   CREATININE 0.93 10/01/2019   BUN 13 10/01/2019   NA 139 10/01/2019   K 4.6 10/01/2019   CL 103 10/01/2019   CO2 23 10/01/2019   His blood pressure has been controlled at home,, today their BP is   He is on cholesterol medication, crestor 10 and denies myalgias. His cholesterol is not at goal less than 70. The cholesterol last visit was:   Lab Results  Component Value Date   CHOL 152 09/16/2019   HDL 50 09/16/2019   LDLCALC 84 09/16/2019   TRIG 88 09/16/2019   CHOLHDL 3.0 09/16/2019   Last A1C in the office was,:  Lab Results  Component Value Date   HGBA1C 6.2 (H) 04/01/2019    Patient is on Vitamin D supplement.   Lab Results  Component Value Date   VD25OH 40 04/22/2013     No results found for: PSA1, PSA ***  Lab Results  Component Value Date   WBC 6.6 05/31/2018   HGB 15.7 05/31/2018   HCT 46.8 05/31/2018   MCV 86 05/31/2018   PLT 147 (L) 05/31/2018   No results found for: IRON, TIBC, FERRITIN No results found for: VITAMINB12     Current Medications:   Current Outpatient Medications  (Endocrine & Metabolic):    empagliflozin (JARDIANCE) 10 MG TABS tablet, Take 1 tablet (10 mg total) by mouth daily.  Current Outpatient Medications (Cardiovascular):    eplerenone (INSPRA) 50 MG tablet, Take 1 tablet by mouth once daily   furosemide (LASIX) 20 MG tablet, Take 1 tablet (20 mg total) by mouth daily.   metoprolol succinate (TOPROL-XL) 100 MG 24 hr tablet, Take 1 tablet (100 mg total) by mouth in the morning and at bedtime. Take with or immediately following a meal.   mexiletine (MEXITIL) 250 MG capsule, Take 1 capsule (250 mg total) by mouth 2 (two) times daily.   rosuvastatin (CRESTOR) 40 MG tablet, Take 1 tablet (40 mg total) by mouth daily.   sacubitril-valsartan (ENTRESTO) 97-103 MG, Take 1 tablet by mouth 2 (two) times daily.   Current Outpatient Medications (Analgesics):    acetaminophen (TYLENOL) 500 MG tablet, Take 1,000 mg by mouth every 6 (six) hours as needed for mild pain or headache.   aspirin EC 81 MG tablet, Take 81 mg by mouth daily. Swallow whole.   Current Outpatient Medications (Other):    ALPRAZolam (XANAX) 0.5 MG tablet, TAKE  1/2 TO 1 (ONE-HALF TO ONE) TABLET BY MOUTH EVERY DAY AT BEDTIME AS NEEDED FOR SLEEP AND LIMIT TO 5 DAYS PER WEEK TO AVOID ADDICTION.   busPIRone (BUSPAR) 10 MG tablet, Take 1 tablet 3 x /day as needed for Anxiety & Mood   Cholecalciferol (VITAMIN D3) 125 MCG (5000 UT) TABS, Take 5,000 Units by mouth daily.    pantoprazole (PROTONIX) 40 MG tablet, Take 1 tablet (40 mg total) by mouth daily.   potassium chloride (KLOR-CON) 10 MEQ tablet, Take 1 tablet (10 mEq total) by mouth daily.   sertraline (ZOLOFT) 100 MG tablet, Take 1 tablet (100 mg total) by mouth daily.  Medical History:  Past Medical History:  Diagnosis Date   Anxiety    Aortic root aneurysm (HCC)    Aortic stenosis    Ascending aortic aneurysm (HCC)    CAD (coronary artery disease)    Chronic combined systolic and diastolic congestive heart  failure (HCC)    Dilated aortic root (HCC)    Hypertension    Non-ischemic cardiomyopathy (HCC)    Nonischemic cardiomyopathy (HCC)    S/P aortic valve replacement with bioprosthetic valve 04/10/2018   29 mm Edwards Inspiris Resilia stented bovine pericardial tissue valve   S/P ascending aortic aneurysm repair 04/10/2018   26 mm Hemashield Platinum supracoronary straight graft   SVT (supraventricular tachycardia) (HCC)    Viral myocarditis 2004   Vitamin D deficiency    Allergies No Known Allergies  Immunization History  Administered Date(s) Administered   Influenza Inj Mdck Quad Pf 12/05/2016   Influenza,inj,Quad PF,6+ Mos 11/10/2017   Influenza-Unspecified 12/05/2016, 11/28/2018   Pneumococcal-Unspecified 02/08/1992   Td 02/07/1997   Tdap 06/01/2017   Colonoscopy: never*** will consider it *** cologuard vs immu ***  Echo 10/2018 Cath 10/2018  CXR 04/2018 CTA chest 03/2018  Last tetanus: 2019 Last influenza: 2020 Shingrix *** Covid 19: ***  Last eye:  Last dental: ***  SURGICAL HISTORY He  has a past surgical history that includes Hernia repair (Left, 2010); Orchiectomy (Left, 1982); Varicose vein surgery (Left, 05/1998); RIGHT HEART CATH AND CORONARY ANGIOGRAPHY (N/A, 03/28/2018); Multiple extractions with alveoloplasty (N/A, 04/05/2018); Ascending aortic root replacement (N/A, 04/10/2018); TEE without cardioversion (N/A, 04/10/2018); and Aortic valve replacement (N/A, 04/10/2018). FAMILY HISTORY His family history includes Alzheimer's disease in his mother; Cancer in his father and mother; Diabetes in his mother; Hypertension in his father, mother, and sister. SOCIAL HISTORY He  reports that he quit smoking about 18 months ago. His smoking use included cigarettes. He quit after 30.00 years of use. He has never used smokeless tobacco. He reports previous alcohol use. He reports that he does not use drugs.  *** Review of Systems  Constitutional: Negative for  malaise/fatigue and weight loss.  HENT: Negative for hearing loss and tinnitus.   Eyes: Negative for blurred vision and double vision.  Respiratory: Negative for cough, shortness of breath and wheezing.   Cardiovascular: Negative for chest pain, palpitations, orthopnea, claudication and leg swelling.  Gastrointestinal: Negative for abdominal pain, blood in stool, constipation, diarrhea, heartburn, melena, nausea and vomiting.  Genitourinary: Negative.   Musculoskeletal: Negative for joint pain and myalgias.  Skin: Negative for rash.  Neurological: Negative for dizziness, tingling, sensory change, weakness and headaches.  Endo/Heme/Allergies: Negative for polydipsia.  Psychiatric/Behavioral: Negative.   All other systems reviewed and are negative.  Physical Exam: There were no vitals taken for this visit. Wt Readings from Last 3 Encounters:  09/03/19 (!) 258 lb (117 kg)  07/02/19  253 lb (114.8 kg)  06/03/19 250 lb 12.8 oz (113.8 kg)  There is no height or weight on file to calculate BMI.  General Appearance: Well nourished, in no apparent distress. Eyes: PERRLA, EOMs, conjunctiva no swelling or erythema Sinuses: No Frontal/maxillary tenderness ENT/Mouth: Ext aud canals clear, TMs without erythema, bulging. No erythema, swelling, or exudate on post pharynx.  Tonsils not swollen or erythematous. Hearing normal.  Neck: Supple, thyroid normal.  Respiratory: Respiratory effort normal, BS equal bilaterally without rales, rhonchi, wheezing or stridor.  Cardio: Regular rate and rhythm, no murmurs, rubs or gallops.  Brisk peripheral pulses with mild bilateral edema, left leg worse than right and large torturous varicose veins bilateral legs with venous stasis Abdomen: Soft, + BS.  Non tender, no guarding, rebound, hernias, masses. Lymphatics: Non tender without lymphadenopathy.  Musculoskeletal: Full ROM, 5/5 strength, normal gait.  Skin: Warm, dry without rashes, lesions, ecchymosis.  Neuro:  Cranial nerves intact. Normal muscle tone, no cerebellar symptoms.  Psych: Awake and oriented X 3, normal affect, Insight and Judgment appropriate.   EKG ***  Gorden Harms Kenzly Rogoff 1:46 PM

## 2019-10-22 ENCOUNTER — Encounter: Payer: Self-pay | Admitting: Adult Health

## 2019-10-22 ENCOUNTER — Other Ambulatory Visit: Payer: Self-pay | Admitting: Adult Health

## 2019-10-22 ENCOUNTER — Telehealth (HOSPITAL_COMMUNITY): Payer: Self-pay | Admitting: Licensed Clinical Social Worker

## 2019-10-22 NOTE — Telephone Encounter (Signed)
CSW looked at pt account notes to see how CAFA application was going.  Saw that they had sent pt a request for additional documentation- need proof of when he was receiving unemployment benefits last year as it was on his 1040 form.  CSW called pt to confirm he was aware they needed this documentation.  Pt states he got the notice but does not think he has proof of the dates he was receiving benefits.  CSW called financial counseling to see if there was alternative paperwork we could provide since pt hasn't received unemployment for some time.  They state pt can submit a letter explaining that he hasn't received unemployment benefits in 2021-CSW informed pt he will try to confirm his unemployment stopped before 2021 and then write letter and bring to [place]  Will continue to follow and assist as needed  [name], [facility] Desk#: [phone] Cell#: [phone]

## 2019-10-30 ENCOUNTER — Telehealth (HOSPITAL_COMMUNITY): Payer: Self-pay | Admitting: Licensed Clinical Social Worker

## 2019-10-30 NOTE — Telephone Encounter (Signed)
Pt was approved for [employer] Assistance at 100% from 10/10/19-04/08/20 per financial counseling notes  CSW called pt to inform.  Will continue to follow and assist as needed  [name], [facility] Desk#: [phone] Cell#: [phone]

## 2019-11-01 DIAGNOSIS — R7309 Other abnormal glucose: Secondary | ICD-10-CM | POA: Insufficient documentation

## 2019-11-01 NOTE — Progress Notes (Deleted)
Patient ID: Tyler Berger, male   DOB: 04/15/1960, 59 y.o.   MRN: [id number]  COMPLETE PHYSICAL  Assessment and Plan:   Encounter for general adult medical examination with abnormal findings Patient does not have insurance- will do minimal labs *** Is due for colonoscopy as well, no symptoms at this time. ***  Essential hypertension Continue medications Monitor blood pressure at home; call if consistently over 130/80 Continue DASH diet.   Reminder to go to the ER if any CP, SOB, nausea, dizziness, severe HA, changes vision/speech, left arm numbness and tingling and jaw pain.  Chronic combined systolic and diastolic congestive heart failure [facility]) May try to maximize medications/blood pressure LONG discussion about sodium intact/sugar intact and maximizing medications to help his heart not work as hard. *** Goal weight is likely closer to 235-240 *** Weight daily and bring log of weight and BP to heart failure clinic  Non-ischemic cardiomyopathy ([place]) Try to maximize medications/blood pressure LONG discussion about sodium intact/sugar intact and maximizing medications to help his heart not work as hard.  *** Goal weight is likely closer to 235-240 Weight daily and bring log of weight and BP to heart failure clinic  SVT (supraventricular tachycardia) (HCC) NSR at this time, remains controlled with meds; cardiology following   PVC (premature ventricular contraction) NSR at this time, recent Zio with burden down to 3.4% Off of amiodarone; cardiology following  S/P aortic valve replacement with bioprosthetic valve + resection ascending thoracic aortic aneurysm Continue cardiology follow up  Hyperlipidemia, unspecified hyperlipidemia type -     Lipid panel check lipids decrease fatty foods increase activity.   Abnormal glucose (prediabetes) Discussed disease and risks; on jardiance for heart/sugars *** Discussed diet/exercise, weight management  -     Hemoglobin  A1c  Anxiety Continue zoloft, buspar and xanax AS needed  Vitamin D deficiency Get on medication ***  Tobacco abuse Quit smoking, congratulated the patient and encouraged him to continue. ***    Continue diet and meds as discussed. Further disposition pending results of labs. Future Appointments  Date Time Provider [place]  11/04/2019  3:00 PM Liane Comber, NP [facility] None  12/04/2019  9:00 AM MC ECHO OP 1 [facility]  12/04/2019 10:00 AM Larey Dresser, MD [facility] None  10/21/2020  2:00 PM Liane Comber, NP [facility] None  11/05/2020  3:00 PM Liane Comber, NP [facility] None    HPI 59 y.o. male  presents for CPE. He has Hyperlipidemia; Hypertension; Anxiety; Vitamin D deficiency; Medication management; Varicose veins of both lower extremities; LBBB (left bundle branch block); SVT (supraventricular tachycardia) ([place]); Tobacco abuse; Non-ischemic cardiomyopathy ([place]); Chronic combined systolic and diastolic congestive heart failure (HCC); S/P aortic valve replacement with bioprosthetic valve + resection ascending thoracic aortic aneurysm; S/P ascending aortic aneurysm repair; PVC (premature ventricular contraction); Coronary artery disease involving native coronary artery of native heart without angina pectoris; Obesity (BMI 30.0-34.9); and Other abnormal glucose (prediabetes) on their problem list.  ***  Uninsured *** selective labs ***  Patient is s/p aortic valve replacement with biosynthetic valve and thoracic aortic aneurysm repair on 04/10/2018 by Dr. Roxy Manns, has systolic heart failure with post op TEE showing EF 35-40%.  He was placed on amiodarone due to PVCs with improvement, then transitioned to mexiletine, toprol, entresto. Following with Dr. Aundra Dubin at the heart failure clinic. Echo 10/2018 showed EF up to 40-45%, normally functioning valve, NYHA class II. Mod nonobstructive CAD per 03/2018 cath.  Zio patch in 4/21 showed PVC burden down to  3.4%.  He has  been stable generally.  ***   Former smoker *** update problem list ***  he has never had a colonoscopy due to uninsured/cost, had AB CTA 03/2018 that was negative for cancer/masses at that time, also normal appearing prostate ***  *** He was tearful and frustrated last visit, it had been a year since his first hospitalization and he is not feeling better, still limited with exercise and fatigue. He was denied disability but very grateful for the help from [employer], appealing disability application, pulm *** is handling ***  He is very sad, he lost his best friends in 2020 *** to massive MI.  He is on zoloft 100 mg, buspar 10 mg ***, xanax *** He had a seizure at [place] ***, thought to be related to Wellbutrin and this medication was stopped. *** add to med allergy list ***  BMI is There is no height or weight on file to calculate BMI., he is working on diet and exercise. Wt Readings from Last 3 Encounters:  09/03/19 (!) 258 lb (117 kg)  07/02/19 253 lb (114.8 kg)  06/03/19 250 lb 12.8 oz (113.8 kg)   His blood pressure {HAS HAS NOT:18834} been controlled at home, today their BP is    He {DOES_DOES VHQ:46962} workout. He denies chest pain, shortness of breath, dizziness.  He is on cholesterol medication, crestor 10 mg daily *** and denies myalgias. His cholesterol is not at goal less than 70. The cholesterol last visit was:   Lab Results  Component Value Date   CHOL 152 09/16/2019   HDL 50 09/16/2019   LDLCALC 84 09/16/2019   TRIG 88 09/16/2019   CHOLHDL 3.0 09/16/2019   Last A1C in the office was,:  Lab Results  Component Value Date   HGBA1C 6.2 (H) 04/01/2019    Patient is on Vitamin D supplement.   Lab Results  Component Value Date   VD25OH 40 04/22/2013     No results found for: PSA1, PSA ***  Lab Results  Component Value Date   WBC 6.6 05/31/2018   HGB 15.7 05/31/2018   HCT 46.8 05/31/2018   MCV 86 05/31/2018   PLT 147 (L) 05/31/2018   No results found for:  IRON, TIBC, FERRITIN No results found for: VITAMINB12     Current Medications:   Current Outpatient Medications (Endocrine & Metabolic):  .  empagliflozin (JARDIANCE) 10 MG TABS tablet, Take 1 tablet (10 mg total) by mouth daily.  Current Outpatient Medications (Cardiovascular):  .  eplerenone (INSPRA) 50 MG tablet, Take 1 tablet by mouth once daily .  furosemide (LASIX) 20 MG tablet, Take 1 tablet (20 mg total) by mouth daily. .  metoprolol succinate (TOPROL-XL) 100 MG 24 hr tablet, Take 1 tablet (100 mg total) by mouth in the morning and at bedtime. Take with or immediately following a meal. .  mexiletine (MEXITIL) 250 MG capsule, Take 1 capsule (250 mg total) by mouth 2 (two) times daily. .  rosuvastatin (CRESTOR) 40 MG tablet, Take 1 tablet (40 mg total) by mouth daily. .  sacubitril-valsartan (ENTRESTO) 97-103 MG, Take 1 tablet by mouth 2 (two) times daily.   Current Outpatient Medications (Analgesics):  .  acetaminophen (TYLENOL) 500 MG tablet, Take 1,000 mg by mouth every 6 (six) hours as needed for mild pain or headache. Marland Kitchen  aspirin EC 81 MG tablet, Take 81 mg by mouth daily. Swallow whole.   Current Outpatient Medications (Other):  Marland Kitchen  ALPRAZolam (XANAX) 0.5 MG  tablet, Take      1/2 - 1 tablet    at Bedtime  ONLY if needed for Sleep  &  limit to 5 days /week to avoid Addiction & Dementia .  busPIRone (BUSPAR) 10 MG tablet, Take 1 tablet 3 x /day as needed for Anxiety & Mood .  Cholecalciferol (VITAMIN D3) 125 MCG (5000 UT) TABS, Take 5,000 Units by mouth daily.  .  pantoprazole (PROTONIX) 40 MG tablet, Take 1 tablet (40 mg total) by mouth daily. .  potassium chloride (KLOR-CON) 10 MEQ tablet, Take 1 tablet (10 mEq total) by mouth daily. .  sertraline (ZOLOFT) 100 MG tablet, Take 1 tablet (100 mg total) by mouth daily.  Medical History:  Past Medical History:  Diagnosis Date  . Anxiety   . 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 Aortic stenosis   . Ascending aortic aneurysm ([place])    . CAD (coronary artery disease)   . Chronic combined systolic and diastolic congestive heart failure ([place])   . Dilated aortic root ([place])   . Hypertension   . Non-ischemic cardiomyopathy ([place])   . Nonischemic cardiomyopathy ([place])   . S/P aortic valve replacement with bioprosthetic valve 04/10/2018   29 mm Edwards Inspiris Resilia stented bovine pericardial tissue valve  . S/P ascending aortic aneurysm repair 04/10/2018   26 mm Hemashield Platinum supracoronary straight graft  . SVT (supraventricular tachycardia) ([place])   . Viral myocarditis 2004  . Vitamin D deficiency    Allergies No Known Allergies  Immunization History  Administered Date(s) Administered  . Influenza Inj Mdck Quad Pf 12/05/2016  . Influenza,inj,Quad PF,6+ Mos 11/10/2017  . Influenza-Unspecified 12/05/2016, 11/28/2018  . Pneumococcal-Unspecified 02/08/1992  . Td 02/07/1997  . Tdap 06/01/2017   Colonoscopy: never*** will consider it *** cologuard vs immu ***  Echo 10/2018 Cath 03/2018  CXR 04/2018 CTA chest 03/2018  Last tetanus: 2019 Last influenza: 2020 Shingrix *** Covid 19: ***  Last eye:  Last dental: ***  SURGICAL HISTORY He  has a past surgical history that includes Hernia repair (Left, 2010); Orchiectomy (Left, 1982); Varicose vein surgery (Left, 05/1998); RIGHT HEART CATH AND CORONARY ANGIOGRAPHY (N/A, 03/28/2018); Multiple extractions with alveoloplasty (N/A, 04/05/2018); Ascending aortic root replacement (N/A, 04/10/2018); TEE without cardioversion (N/A, 04/10/2018); and Aortic valve replacement (N/A, 04/10/2018). FAMILY HISTORY His family history includes Alzheimer's disease in his mother; Cancer in his father and mother; Diabetes in his mother; Hypertension in his father, mother, and sister. SOCIAL HISTORY He  reports that he quit smoking about 19 months ago. His smoking use included cigarettes. He quit after 30.00 years of use. He has never used smokeless tobacco. He reports previous alcohol use. He  reports that he does not use drugs.  *** Review of Systems  Constitutional: Negative for malaise/fatigue and weight loss.  HENT: Negative for hearing loss and tinnitus.   Eyes: Negative for blurred vision and double vision.  Respiratory: Negative for cough, shortness of breath and wheezing.   Cardiovascular: Negative for chest pain, palpitations, orthopnea, claudication and leg swelling.  Gastrointestinal: Negative for abdominal pain, blood in stool, constipation, diarrhea, heartburn, melena, nausea and vomiting.  Genitourinary: Negative.   Musculoskeletal: Negative for joint pain and myalgias.  Skin: Negative for rash.  Neurological: Negative for dizziness, tingling, sensory change, weakness and headaches.  Endo/Heme/Allergies: Negative for polydipsia.  Psychiatric/Behavioral: Negative.   All other systems reviewed and are negative.  Physical Exam: There were no vitals taken for this visit. Wt Readings from Last 3 Encounters:  09/03/19 (!) 258 lb (117 kg)  07/02/19 253 lb (114.8 kg)  06/03/19 250 lb 12.8 oz (113.8 kg)  There is no height or weight on file to calculate BMI.  General Appearance: Well nourished, in no apparent distress. Eyes: PERRLA, EOMs, conjunctiva no swelling or erythema Sinuses: No Frontal/maxillary tenderness ENT/Mouth: Ext aud canals clear, TMs without erythema, bulging. No erythema, swelling, or exudate on post pharynx.  Tonsils not swollen or erythematous. Hearing normal.  Neck: Supple, thyroid normal.  Respiratory: Respiratory effort normal, BS equal bilaterally without rales, rhonchi, wheezing or stridor.  Cardio: Regular rate and rhythm, no murmurs, rubs or gallops.  Brisk peripheral pulses with mild bilateral edema, left leg worse than right and large torturous varicose veins bilateral legs with venous stasis Abdomen: Soft, + BS.  Non tender, no guarding, rebound, hernias, masses. Lymphatics: Non tender without lymphadenopathy.  Musculoskeletal: Full  ROM, 5/5 strength, normal gait.  Skin: Warm, dry without rashes, lesions, ecchymosis.  Neuro: Cranial nerves intact. Normal muscle tone, no cerebellar symptoms.  Psych: Awake and oriented X 3, normal affect, Insight and Judgment appropriate.   EKG ***  Izora Ribas 2:51 PM

## 2019-11-04 ENCOUNTER — Encounter: Payer: Self-pay | Admitting: Adult Health

## 2019-11-04 MED FILL — JARDIANCE 10 MG TABLET: 10 | 30 days supply | Qty: 30 | Fill #1

## 2019-11-08 NOTE — Progress Notes (Signed)
Patient ID: Tyler Berger, male   DOB: [birth year], 59 y.o.   MRN: [id number]  COMPLETE PHYSICAL  Assessment and Plan:   Encounter for general adult medical examination with abnormal findings Patient does not have insurance- will do minimal labs as verified with him Is due for colonoscopy as well, no symptoms at this time - had normal abd/pelvis CT 03/2018, also normal appearing prostate at that time, denies concerning sx Plan for colonoscopy once has medicare  Essential hypertension Continue medications Monitor blood pressure at home; call if consistently over 130/80 Continue DASH diet.   Reminder to go to the ER if any CP, SOB, nausea, dizziness, severe HA, changes vision/speech, left arm numbness and tingling and jaw pain.  Chronic combined systolic and diastolic congestive heart failure [facility]) May try to maximize medications/blood pressure LONG discussion about sodium/sugar and maximizing medications to help his heart not work as hard.  Goal weight is likely closer to 235-240  Weight daily and bring log of weight and BP to heart failure clinic  Non-ischemic cardiomyopathy ([place]) Try to maximize medications/blood pressure LONG discussion about sodium/sugar and maximizing medications to help his heart not work as hard.   Goal weight is likely closer to 235-240 WEIGH DAILY and bring log of weight and BP to heart failure clinic  SVT (supraventricular tachycardia) (HCC) NSR at this time, remains controlled with meds; cardiology following   PVC (premature ventricular contraction) NSR at this time, recent Zio with burden down to 3.4% Off of amiodarone; cardiology following  S/P aortic valve replacement with bioprosthetic valve + resection ascending thoracic aortic aneurysm Continue cardiology follow up  Hyperlipidemia, unspecified hyperlipidemia type Continue rosuvastatin; defer lipids to cardiology who has been checking, no insurance decrease fatty foods increase activity.    Abnormal glucose (prediabetes) Discussed disease and risks; on jardiance for heart/sugars  Discussed diet/exercise, weight management  Given glucometer sample and strips, can check PRN fasting  If A1C 7+ plan to start metformin -     Hemoglobin A1c  Anxiety Continue zoloft, buspar and xanax AS needed Declines dose change today;  Financial stress significant - pending disability  Stress management techniques discussed, increase water, good sleep hygiene discussed, increase exercise, and increase veggies.   Vitamin D deficiency Continue supplement   Former smoker Quit smoking, congratulated the patient and encouraged him to continue.  30 pack year; once he has insurance will discuss low dose CT; denies concerning sx today     Continue diet and meds as discussed. Further disposition pending results of labs. Future Appointments  Date Time Provider [place]  12/04/2019  9:00 AM [facility] ECHO OP 1 [facility]  12/04/2019 10:00 AM Larey Dresser, MD [facility] None  11/10/2020  2:00 PM Liane Comber, NP [facility] None    HPI 59 y.o. male  presents for CPE. He has Hyperlipidemia; Hypertension; Anxiety; Vitamin D deficiency; Medication management; Varicose veins of both lower extremities; LBBB (left bundle branch block); SVT (supraventricular tachycardia) ([place]); Tobacco abuse; Non-ischemic cardiomyopathy ([place]); Chronic combined systolic and diastolic congestive heart failure (HCC); S/P aortic valve replacement with bioprosthetic valve + resection ascending thoracic aortic aneurysm; S/P ascending aortic aneurysm repair; PVC (premature ventricular contraction); Coronary artery disease involving native coronary artery of native heart without angina pectoris; Obesity (BMI 30.0-34.9); and Other abnormal glucose (prediabetes) on their problem list.  He is divorced from wife, has male partner, not frequently active, declines STD testing.  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  Currently uninsured and requests selective labs.   Patient is s/p aortic valve replacement with biosynthetic valve and thoracic aortic aneurysm repair on 04/10/2018 by Dr. Roxy Manns, has systolic heart failure with post op TEE showing EF 35-40%.  He was placed on amiodarone due to PVCs with improvement, then transitioned to mexiletine, toprol, entresto. Following with Dr. Aundra Dubin at the heart failure clinic. Echo 10/2018 showed EF up to 40-45%, normally functioning valve, NYHA class II. Mod nonobstructive CAD per 03/2018 cath.  Zio patch in 4/21 showed PVC burden down to 3.4%.  He has been stable generally, though with ongoing limitations with activity and fatigue.  He was denied disability but very grateful for the help from [facility], getting meds from them, appealing disability application, cardiology is managing.   he has never had a colonoscopy due to uninsured/cost, on review had AB CTA 03/2018 that was negative for cancer/masses at that time, also normal appearing prostate.   He has depression/anxiety, he lost his best friend last year to massive MI, stress r/t disability and finances He is on zoloft 100 mg, buspar 10 mg TID which has helped, xanax 0.25 mg most evenings with good results. Formerly on wellbutrin, was stopped due to ? Seizure at [employer]. Feels fairly controlled, just ready for financial stress to be resolved, was living in car for a while.   BMI is Body mass index is 34.53 kg/m., he is working on diet and exercise., trying to eat healthy, oatmeal, lean proteins, fruits/veggies, watches sodium closely  Was formerly very active, went to GYM daily, slowly trying to increase now walking around the block in his neighborhood 2-3 days a week 4-5 bottles a day of water  1 glass wine/week  Wt Readings from Last 3 Encounters:  11/11/19 256 lb 6.4 oz (116.3 kg)  09/03/19 (!) 258 lb (117 kg)   07/02/19 253 lb (114.8 kg)   His blood pressure has been controlled at home, today their BP is BP: 110/70  He does workout, walking 3 days a week. He denies chest pain, shortness of breath, dizziness, edema, PND. Has intermittent fatigue, "just don't feel good."   He is on cholesterol medication, crestor 10 mg daily and denies myalgias. His cholesterol is not at goal less than 70. The cholesterol last visit was:   Lab Results  Component Value Date   CHOL 152 09/16/2019   HDL 50 09/16/2019   LDLCALC 84 09/16/2019   TRIG 88 09/16/2019   CHOLHDL 3.0 09/16/2019    He has been working on diet and exercise for prediabetes, and denies increased appetite, nausea, paresthesia of the feet, polydipsia, polyuria, visual disturbances, vomiting and weight loss.  On Jardiance via cardiology for heart -  Doesn't have glucometer but would like one - sample given due to no insurance  Last A1C in the office was:  Lab Results  Component Value Date   HGBA1C 6.2 (H) 04/01/2019    Patient is on Vitamin D supplement.   Lab Results  Component Value Date   VD25OH 40 04/22/2013     No results found for: PSA1, PSA  Denies obstructive urinary sx; straining, nocturia, dribbing, frequency   Current Medications:   Current Outpatient Medications (Endocrine & Metabolic):  .  empagliflozin (JARDIANCE) 10 MG TABS tablet, Take 1 tablet (10 mg total) by mouth daily.  Current Outpatient Medications (Cardiovascular):  .  eplerenone (INSPRA) 50 MG tablet, Take 1 tablet  by mouth once daily .  furosemide (LASIX) 20 MG tablet, Take 1 tablet (20 mg total) by mouth daily. .  metoprolol succinate (TOPROL-XL) 100 MG 24 hr tablet, Take 1 tablet (100 mg total) by mouth in the morning and at bedtime. Take with or immediately following a meal. .  mexiletine (MEXITIL) 250 MG capsule, Take 1 capsule (250 mg total) by mouth 2 (two) times daily. .  rosuvastatin (CRESTOR) 40 MG tablet, Take 1 tablet (40 mg total) by mouth  daily. .  sacubitril-valsartan (ENTRESTO) 97-103 MG, Take 1 tablet by mouth 2 (two) times daily.   Current Outpatient Medications (Analgesics):  .  acetaminophen (TYLENOL) 500 MG tablet, Take 1,000 mg by mouth every 6 (six) hours as needed for mild pain or headache. Marland Kitchen  aspirin EC 81 MG tablet, Take 81 mg by mouth daily. Swallow whole.   Current Outpatient Medications (Other):  Marland Kitchen  ALPRAZolam (XANAX) 0.5 MG tablet, Take      1/2 - 1 tablet    at Bedtime  ONLY if needed for Sleep  &  limit to 5 days /week to avoid Addiction & Dementia .  busPIRone (BUSPAR) 10 MG tablet, Take 1 tablet 3 x /day as needed for Anxiety & Mood .  Cholecalciferol (VITAMIN D3) 125 MCG (5000 UT) TABS, Take 5,000 Units by mouth daily.  .  pantoprazole (PROTONIX) 40 MG tablet, Take 1 tablet (40 mg total) by mouth daily. .  potassium chloride (KLOR-CON) 10 MEQ tablet, Take 1 tablet (10 mEq total) by mouth daily. .  sertraline (ZOLOFT) 100 MG tablet, Take 1 tablet (100 mg total) by mouth daily.  Medical History:  Past Medical History:  Diagnosis Date  . Anxiety   . 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 Aortic stenosis   . Ascending aortic aneurysm ([place])   . CAD (coronary artery disease)   . Chronic combined systolic and diastolic congestive heart failure ([place])   . Dilated aortic root ([place])   . Hypertension   . Non-ischemic cardiomyopathy ([place])   . Nonischemic cardiomyopathy ([place])   . S/P aortic valve replacement with bioprosthetic valve 04/10/2018   29 mm Edwards Inspiris Resilia stented bovine pericardial tissue valve  . S/P ascending aortic aneurysm repair 04/10/2018   26 mm Hemashield Platinum supracoronary straight graft  . SVT (supraventricular tachycardia) ([place])   . Viral myocarditis 2004  . Vitamin D deficiency    Allergies No Known Allergies  Immunization History  Administered Date(s) Administered  . Influenza Inj Mdck Quad Pf 12/05/2016  . Influenza,inj,Quad PF,6+ Mos 11/10/2017  . Influenza-Unspecified  12/05/2016, 11/28/2018  . Pneumococcal-Unspecified 02/08/1992  . Td 02/07/1997  . Tdap 06/01/2017   Colonoscopy: never, defer due to no insurance, consider cologuard  Echo 10/2018 Cath 03/2018  CXR 04/2018 CTA chest 03/2018  Last tetanus: 2019 Last influenza: 2020 Covid 19: 2/2, 2021, pfizer   Last eye: has readers, no other concerns Last dental: no concerns, last 2020  SURGICAL HISTORY He  has a past surgical history that includes Hernia repair (Left, 2010); Orchiectomy (Left, 1982); Varicose vein surgery (Left, 05/1998); RIGHT HEART CATH AND CORONARY ANGIOGRAPHY (N/A, 03/28/2018); Multiple extractions with alveoloplasty (N/A, 04/05/2018); Ascending aortic root replacement (N/A, 04/10/2018); TEE without cardioversion (N/A, 04/10/2018); and Aortic valve replacement (N/A, 04/10/2018). FAMILY HISTORY His family history includes Alzheimer's disease in his mother; Breast cancer (age of onset: 73) in his mother; Cancer in his father; Diabetes in his daughter, mother, and son; Hypertension in his father, mother, and sister. SOCIAL  HISTORY He  reports that he quit smoking about 19 months ago. His smoking use included cigarettes. He started smoking about 41 years ago. He has a 30.00 pack-year smoking history. He has never used smokeless tobacco. He reports current alcohol use of about 1.0 standard drink of alcohol per week. He reports that he does not use drugs.   Review of Systems  Constitutional: Positive for malaise/fatigue. Negative for weight loss.  HENT: Negative for hearing loss and tinnitus.   Eyes: Negative for blurred vision and double vision.  Respiratory: Negative for cough, shortness of breath and wheezing.   Cardiovascular: Negative for chest pain, palpitations, orthopnea, claudication and leg swelling.  Gastrointestinal: Negative for abdominal pain, blood in stool, constipation, diarrhea, heartburn, melena, nausea and vomiting.  Genitourinary: Negative.   Musculoskeletal: Negative for  joint pain and myalgias.  Skin: Negative for rash.  Neurological: Negative for dizziness, tingling, sensory change, weakness and headaches.  Endo/Heme/Allergies: Negative for polydipsia.  Psychiatric/Behavioral: Negative for depression, substance abuse and suicidal ideas. The patient is nervous/anxious and has insomnia.   All other systems reviewed and are negative.   Physical Exam: BP 110/70   Pulse 85   Temp (!) 97.2 F (36.2 C)   Ht 6' 0.25" (1.835 m)   Wt 256 lb 6.4 oz (116.3 kg)   SpO2 96%   BMI 34.53 kg/m  Wt Readings from Last 3 Encounters:  11/11/19 256 lb 6.4 oz (116.3 kg)  09/03/19 (!) 258 lb (117 kg)  07/02/19 253 lb (114.8 kg)  Body mass index is 34.53 kg/m.  General Appearance: Well nourished, well dressed male in no acute distress. Eyes: PERRLA, EOMs, conjunctiva no swelling or erythema Sinuses: No Frontal/maxillary tenderness ENT/Mouth: Ext aud canals clear, TMs without erythema, bulging. No erythema, swelling, or exudate on post pharynx.  Tonsils not swollen or erythematous. Hearing normal.  Neck: Supple, thyroid normal.  Respiratory: Respiratory effort normal, BS equal bilaterally without rales, rhonchi, wheezing or stridor.  Cardio: Regular rate and rhythm, no murmurs, rubs or gallops.  Brisk peripheral pulses without significant edmea;  large torturous varicose veins bilateral legs, non-tender.  Abdomen: Soft, + BS.  Non tender, no guarding, rebound, hernias, masses. Lymphatics: Non tender without lymphadenopathy.  Musculoskeletal: Full ROM, 5/5 strength, normal gait.  Skin: Warm, dry without rashes, lesions, ecchymosis.  Neuro: Cranial nerves intact. Normal muscle tone, no cerebellar symptoms.  Psych: Awake and oriented X 3, anxious affect, Insight and Judgment appropriate.  GU: reports regular self exams, DECLINES today   EKG: getting by cardiology - defer to them   Izora Ribas 2:27 PM

## 2019-11-11 ENCOUNTER — Encounter: Payer: Self-pay | Admitting: Adult Health

## 2019-11-11 ENCOUNTER — Other Ambulatory Visit: Payer: Self-pay

## 2019-11-11 ENCOUNTER — Ambulatory Visit (INDEPENDENT_AMBULATORY_CARE_PROVIDER_SITE_OTHER): Payer: Self-pay | Admitting: Adult Health

## 2019-11-11 VITALS — BP 110/70 | HR 85 | Temp 97.2°F | Ht 72.25 in | Wt 256.4 lb

## 2019-11-11 DIAGNOSIS — Z79899 Other long term (current) drug therapy: Secondary | ICD-10-CM

## 2019-11-11 DIAGNOSIS — Z87891 Personal history of nicotine dependence: Secondary | ICD-10-CM

## 2019-11-11 DIAGNOSIS — E559 Vitamin D deficiency, unspecified: Secondary | ICD-10-CM

## 2019-11-11 DIAGNOSIS — I2510 Atherosclerotic heart disease of native coronary artery without angina pectoris: Secondary | ICD-10-CM

## 2019-11-11 DIAGNOSIS — I5042 Chronic combined systolic (congestive) and diastolic (congestive) heart failure: Secondary | ICD-10-CM

## 2019-11-11 DIAGNOSIS — I10 Essential (primary) hypertension: Secondary | ICD-10-CM

## 2019-11-11 DIAGNOSIS — I471 Supraventricular tachycardia, unspecified: Secondary | ICD-10-CM

## 2019-11-11 DIAGNOSIS — E785 Hyperlipidemia, unspecified: Secondary | ICD-10-CM

## 2019-11-11 DIAGNOSIS — E66811 Obesity, class 1: Secondary | ICD-10-CM

## 2019-11-11 DIAGNOSIS — I428 Other cardiomyopathies: Secondary | ICD-10-CM

## 2019-11-11 DIAGNOSIS — Z8679 Personal history of other diseases of the circulatory system: Secondary | ICD-10-CM

## 2019-11-11 DIAGNOSIS — E669 Obesity, unspecified: Secondary | ICD-10-CM

## 2019-11-11 DIAGNOSIS — Z0001 Encounter for general adult medical examination with abnormal findings: Secondary | ICD-10-CM

## 2019-11-11 DIAGNOSIS — Z9889 Other specified postprocedural states: Secondary | ICD-10-CM

## 2019-11-11 DIAGNOSIS — I8393 Asymptomatic varicose veins of bilateral lower extremities: Secondary | ICD-10-CM

## 2019-11-11 DIAGNOSIS — F419 Anxiety disorder, unspecified: Secondary | ICD-10-CM

## 2019-11-11 DIAGNOSIS — Z953 Presence of xenogenic heart valve: Secondary | ICD-10-CM

## 2019-11-11 DIAGNOSIS — I447 Left bundle-branch block, unspecified: Secondary | ICD-10-CM

## 2019-11-11 DIAGNOSIS — I493 Ventricular premature depolarization: Secondary | ICD-10-CM

## 2019-11-11 DIAGNOSIS — Z23 Encounter for immunization: Secondary | ICD-10-CM

## 2019-11-11 DIAGNOSIS — R7309 Other abnormal glucose: Secondary | ICD-10-CM

## 2019-11-11 NOTE — Patient Instructions (Addendum)
Tyler Berger , Thank you for taking time to come for your Annual Wellness Visit. I appreciate your ongoing commitment to your health goals. Please review the following plan we discussed and let me know if I can assist you in the future.   These are the goals we discussed: Goals    . Exercise 150 min/wk Moderate Activity       This is a list of the screening recommended for you and due dates:  Health Maintenance  Topic Date Due  .  Hepatitis C: One time screening is recommended by Center for Disease Control  (CDC) for  adults born from [birth year] through 1966.   Never done  . COVID-19 Vaccine (1) Never done  . Colon Cancer Screening  Never done  . Flu Shot  09/08/2019  . Tetanus Vaccine  06/02/2027  . HIV Screening  Completed     Send me covid 19 vaccine information -     Recommend doing daily walking, work on increasing duration and intensity gradually as tolerated     Know what a healthy weight is for you (roughly BMI <25) and aim to maintain this  Aim for 7+ servings of fruits and vegetables daily  65-80+ fluid ounces of water or unsweet tea for healthy kidneys  Limit to max 1 drink of alcohol per day; avoid smoking/tobacco  Limit animal fats in diet for cholesterol and heart health - choose grass fed whenever available  Avoid highly processed foods, and foods high in saturated/trans fats  Aim for low stress - take time to unwind and care for your mental health  Aim for 150 min of moderate intensity exercise weekly for heart health, and weights twice weekly for bone health  Aim for 7-9 hours of sleep daily       High-Fiber Diet Fiber, also called dietary fiber, is a type of carbohydrate that is found in fruits, vegetables, whole grains, and beans. A high-fiber diet can have many health benefits. Your health care provider may recommend a high-fiber diet to help:  Prevent constipation. Fiber can make your bowel movements more regular.  Lower your  cholesterol.  Relieve the following conditions: ? Swelling of veins in the anus (hemorrhoids). ? Swelling and irritation (inflammation) of specific areas of the digestive tract (uncomplicated diverticulosis). ? A problem of the large intestine (colon) that sometimes causes pain and diarrhea (irritable bowel syndrome, IBS).  Prevent overeating as part of a weight-loss plan.  Prevent heart disease, type 2 diabetes, and certain cancers. What is my plan? The recommended daily fiber intake in grams (g) includes:  38 g for men age 8 or younger.  30 g for men over age 10.  85 g for women age 37 or younger.  21 g for women over age 14. You can get the recommended daily intake of dietary fiber by:  Eating a variety of fruits, vegetables, grains, and beans.  Taking a fiber supplement, if it is not possible to get enough fiber through your diet. What do I need to know about a high-fiber diet?  It is better to get fiber through food sources rather than from fiber supplements. There is not a lot of research about how effective supplements are.  Always check the fiber content on the nutrition facts label of any prepackaged food. Look for foods that contain 5 g of fiber or more per serving.  Talk with a diet and nutrition specialist (dietitian) if you have questions about specific foods that are recommended or  not recommended for your medical condition, especially if those foods are not listed below.  Gradually increase how much fiber you consume. If you increase your intake of dietary fiber too quickly, you may have bloating, cramping, or gas.  Drink plenty of water. Water helps you to digest fiber. What are tips for following this plan?  Eat a wide variety of high-fiber foods.  Make sure that half of the grains that you eat each day are whole grains.  Eat breads and cereals that are made with whole-grain flour instead of refined flour or white flour.  Eat brown rice, bulgur wheat, or  millet instead of white rice.  Start the day with a breakfast that is high in fiber, such as a cereal that contains 5 g of fiber or more per serving.  Use beans in place of meat in soups, salads, and pasta dishes.  Eat high-fiber snacks, such as berries, raw vegetables, nuts, and popcorn.  Choose whole fruits and vegetables instead of processed forms like juice or sauce. What foods can I eat?  Fruits Berries. Pears. Apples. Oranges. Avocado. Prunes and raisins. Dried figs. Vegetables Sweet potatoes. Spinach. Kale. Artichokes. Cabbage. Broccoli. Cauliflower. Green peas. Carrots. Squash. Grains Whole-grain breads. Multigrain cereal. Oats and oatmeal. Brown rice. Barley. Bulgur wheat. [place]. Quinoa. Bran muffins. Popcorn. Rye wafer crackers. Meats and other proteins Navy, kidney, and pinto beans. Soybeans. Split peas. Lentils. Nuts and seeds. Dairy Fiber-fortified yogurt. Beverages Fiber-fortified soy milk. Fiber-fortified orange juice. Other foods Fiber bars. The items listed above may not be a complete list of recommended foods and beverages. Contact a dietitian for more options. What foods are not recommended? Fruits Fruit juice. Cooked, strained fruit. Vegetables Fried potatoes. Canned vegetables. Well-cooked vegetables. Grains White bread. Pasta made with refined flour. White rice. Meats and other proteins Fatty cuts of meat. Fried chicken or fried fish. Dairy Milk. Yogurt. Cream cheese. Sour cream. Fats and oils Butters. Beverages Soft drinks. Other foods Cakes and pastries. The items listed above may not be a complete list of foods and beverages to avoid. Contact a dietitian for more information. Summary  Fiber is a type of carbohydrate. It is found in fruits, vegetables, whole grains, and beans.  There are many health benefits of eating a high-fiber diet, such as preventing constipation, lowering blood cholesterol, helping with weight loss, and reducing your risk  of heart disease, diabetes, and certain cancers.  Gradually increase your intake of fiber. Increasing too fast can result in cramping, bloating, and gas. Drink plenty of water while you increase your fiber.  The best sources of fiber include whole fruits and vegetables, whole grains, nuts, seeds, and beans. This information is not intended to replace advice given to you by your health care provider. Make sure you discuss any questions you have with your health care provider. Document Revised: 11/28/2016 Document Reviewed: 11/28/2016 Elsevier Patient Education  2020 [employer].

## 2019-11-12 LAB — URINALYSIS, ROUTINE W REFLEX MICROSCOPIC
Bacteria, UA: NONE SEEN /HPF
Bilirubin Urine: NEGATIVE
Hyaline Cast: NONE SEEN /LPF
Ketones, ur: NEGATIVE
Leukocytes,Ua: NEGATIVE
Nitrite: NEGATIVE
Protein, ur: NEGATIVE
RBC / HPF: NONE SEEN /HPF (ref 0–2)
Specific Gravity, Urine: 1.012 (ref 1.001–1.03)
Squamous Epithelial / HPF: NONE SEEN /HPF (ref <=5)
WBC, UA: NONE SEEN /HPF (ref 0–5)
pH: 5.5 (ref 5.0–8.0)

## 2019-11-12 LAB — CBC WITH DIFFERENTIAL/PLATELET
Absolute Monocytes: 784 cells/uL (ref 200–950)
Basophils Absolute: 48 cells/uL (ref 0–200)
Basophils Relative: 0.6 %
Eosinophils Absolute: 192 cells/uL (ref 15–500)
Eosinophils Relative: 2.4 %
HCT: 53.8 % — ABNORMAL HIGH (ref 38.5–50.0)
Hemoglobin: 18.7 g/dL — ABNORMAL HIGH (ref 13.2–17.1)
Lymphs Abs: 1944 cells/uL (ref 850–3900)
MCH: 31.7 pg (ref 27.0–33.0)
MCHC: 34.8 g/dL (ref 32.0–36.0)
MCV: 91.3 fL (ref 80.0–100.0)
MPV: 11 fL (ref 7.5–12.5)
Monocytes Relative: 9.8 %
Neutro Abs: 5032 cells/uL (ref 1500–7800)
Neutrophils Relative %: 62.9 %
Platelets: 136 10*3/uL — ABNORMAL LOW (ref 140–400)
RBC: 5.89 10*6/uL — ABNORMAL HIGH (ref 4.20–5.80)
RDW: 12.9 % (ref 11.0–15.0)
Total Lymphocyte: 24.3 %
WBC: 8 10*3/uL (ref 3.8–10.8)

## 2019-11-12 LAB — HEMOGLOBIN A1C
Hgb A1c MFr Bld: 5.8 % of total Hgb — ABNORMAL HIGH (ref <5.7)
Mean Plasma Glucose: 120 (calc)
eAG (mmol/L): 6.6 (calc)

## 2019-11-18 ENCOUNTER — Other Ambulatory Visit (HOSPITAL_COMMUNITY): Payer: Self-pay | Admitting: Cardiology

## 2019-11-18 MED ORDER — METOPROLOL SUCCINATE ER 100 MG PO TB24
100.0000 mg | ORAL_TABLET | Freq: Two times a day (BID) | ORAL | 4 refills | Status: DC
Start: 2019-11-18 — End: 2020-02-17

## 2019-11-18 MED FILL — METOPROLOL SUCCINATE ER 100: 100 | 30 days supply | Qty: 60 | Fill #0

## 2019-11-18 NOTE — Addendum Note (Signed)
Addended by: Vallorie Niccoli, Sharlot Gowda on: 11/18/2019 02:03 PM   Modules accepted: Orders

## 2019-11-19 MED FILL — JARDIANCE 10 MG TABLET: 10 | 30 days supply | Qty: 30 | Fill #1

## 2019-11-20 ENCOUNTER — Other Ambulatory Visit: Payer: Self-pay | Admitting: Internal Medicine

## 2019-11-20 DIAGNOSIS — F419 Anxiety disorder, unspecified: Secondary | ICD-10-CM

## 2019-12-04 ENCOUNTER — Other Ambulatory Visit: Payer: Self-pay

## 2019-12-04 ENCOUNTER — Ambulatory Visit (HOSPITAL_COMMUNITY)
Admission: RE | Admit: 2019-12-04 | Discharge: 2019-12-04 | Disposition: A | Discharge status: Home / Self Care | Payer: Self-pay | Source: Ambulatory Visit | Attending: Cardiology | Admitting: Cardiology

## 2019-12-04 ENCOUNTER — Ambulatory Visit (HOSPITAL_BASED_OUTPATIENT_CLINIC_OR_DEPARTMENT_OTHER)
Admission: RE | Admit: 2019-12-04 | Discharge: 2019-12-04 | Disposition: A | Discharge status: Home / Self Care | Payer: Self-pay | Source: Ambulatory Visit | Attending: Cardiology | Admitting: Cardiology

## 2019-12-04 ENCOUNTER — Encounter (HOSPITAL_COMMUNITY): Payer: Self-pay | Admitting: Cardiology

## 2019-12-04 VITALS — BP 144/88 | HR 84 | Wt 261.0 lb

## 2019-12-04 DIAGNOSIS — Z79899 Other long term (current) drug therapy: Secondary | ICD-10-CM | POA: Insufficient documentation

## 2019-12-04 DIAGNOSIS — Z7982 Long term (current) use of aspirin: Secondary | ICD-10-CM | POA: Insufficient documentation

## 2019-12-04 DIAGNOSIS — I350 Nonrheumatic aortic (valve) stenosis: Secondary | ICD-10-CM | POA: Insufficient documentation

## 2019-12-04 DIAGNOSIS — I5042 Chronic combined systolic (congestive) and diastolic (congestive) heart failure: Secondary | ICD-10-CM

## 2019-12-04 DIAGNOSIS — E785 Hyperlipidemia, unspecified: Secondary | ICD-10-CM

## 2019-12-04 DIAGNOSIS — I5022 Chronic systolic (congestive) heart failure: Secondary | ICD-10-CM | POA: Insufficient documentation

## 2019-12-04 DIAGNOSIS — I428 Other cardiomyopathies: Secondary | ICD-10-CM | POA: Insufficient documentation

## 2019-12-04 DIAGNOSIS — I493 Ventricular premature depolarization: Secondary | ICD-10-CM

## 2019-12-04 DIAGNOSIS — Q231 Congenital insufficiency of aortic valve: Secondary | ICD-10-CM | POA: Insufficient documentation

## 2019-12-04 DIAGNOSIS — F1010 Alcohol abuse, uncomplicated: Secondary | ICD-10-CM | POA: Insufficient documentation

## 2019-12-04 DIAGNOSIS — Z8249 Family history of ischemic heart disease and other diseases of the circulatory system: Secondary | ICD-10-CM | POA: Insufficient documentation

## 2019-12-04 DIAGNOSIS — I2510 Atherosclerotic heart disease of native coronary artery without angina pectoris: Secondary | ICD-10-CM | POA: Insufficient documentation

## 2019-12-04 DIAGNOSIS — I110 Hypertensive heart disease with heart failure: Secondary | ICD-10-CM | POA: Insufficient documentation

## 2019-12-04 DIAGNOSIS — Z87891 Personal history of nicotine dependence: Secondary | ICD-10-CM | POA: Insufficient documentation

## 2019-12-04 DIAGNOSIS — I447 Left bundle-branch block, unspecified: Secondary | ICD-10-CM | POA: Insufficient documentation

## 2019-12-04 DIAGNOSIS — Z953 Presence of xenogenic heart valve: Secondary | ICD-10-CM | POA: Insufficient documentation

## 2019-12-04 LAB — BASIC METABOLIC PANEL
Anion gap: 8 (ref 5–15)
BUN: 18 mg/dL (ref 6–20)
CO2: 24 mmol/L (ref 22–32)
Calcium: 9.4 mg/dL (ref 8.9–10.3)
Chloride: 106 mmol/L (ref 98–111)
Creatinine, Ser: 0.92 mg/dL (ref 0.61–1.24)
GFR, Estimated: >60 mL/min (ref >60)
Glucose, Bld: 131 mg/dL — ABNORMAL HIGH (ref 70–99)
Potassium: 4.7 mmol/L (ref 3.5–5.1)
Sodium: 138 mmol/L (ref 135–145)

## 2019-12-04 LAB — ECHOCARDIOGRAM COMPLETE
AR max vel: 2.18 cm2
AV Area VTI: 2.4 cm2
AV Area mean vel: 2.23 cm2
AV Mean grad: 10 mmHg
AV Peak grad: 20.7 mmHg
Ao pk vel: 2.28 m/s
Area-P 1/2: 3.6 cm2
Calc EF: 53.3 %
Single Plane A2C EF: 56.7 %
Single Plane A4C EF: 51 %

## 2019-12-04 LAB — LIPID PANEL
Cholesterol: 159 mg/dL (ref 0–200)
HDL: 48 mg/dL (ref >40)
LDL Cholesterol: 91 mg/dL (ref 0–99)
Total CHOL/HDL Ratio: 3.3 RATIO
Triglycerides: 100 mg/dL (ref <150)
VLDL: 20 mg/dL (ref 0–40)

## 2019-12-04 NOTE — Progress Notes (Signed)
  Echocardiogram 2D Echocardiogram has been performed.  Michiel Cowboy 12/04/2019, 9:29 AM

## 2019-12-04 NOTE — Progress Notes (Signed)
PCP: Unk Pinto, MD HF Cardiology: Dr Aundra Dubin   HPI: 59 y.o. with history of cardiomyopathy and aortic stenosis returns for followup in CHF.  In 2004, he was diagnosed with nonischemic cardiomyopathy, EF 20% at that time.  His EF recovered back up to 60-65%, but he was noted to have moderate aortic stenosis with a bicuspid valve. He was not seen by cardiology for a number of years.   He was in the ER in 1/20 with SVT that converted spontaneously.  He was set up for an echo and sent back to cardiology.  Echo showed EF 25-30%, diffuse hypokinesis, probably severe aortic stenosis with bicuspid aortic valve.  He was referred for RHC/LHC.    RHC/LHC was done 2/20.  He had moderate coronary disease but no critical stenoses and no disease to explain his cardiomyopathy.  RHC showed markedly elevated PCWP, mixed pulmonary venous/pulmonary arterial hypertension, and low CI at 1.7.  I decided to admit him for diuresis, optimization of meds, and further workup of aortic stenosis.  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  He was evaluated by the structural heart team but valve area thought to be too big for the existing TAVR valves.  Therefore, surgical AVR was recommended.   On 04/10/18 he underwent bioprosthetic AVR with supracoronary ascending aorta replacement  by Dr Roxy Manns. He was placed on amiodarone due to increased PVCs post-op. Discharge weight was 224 pounds.  Echo in 8/20 showed EF 40-45% with mild LVH, septal-lateral dyssynchrony, normal bioprosthetic aortic valve.  Zio patch in 12/20 showed 15.9% PVCs and NSVT runs.  Amiodarone was started. He saw Dr. Curt Bears and amiodarone was stopped, mexiletine was started.  Zio patch in 4/21 showed PVC burden down to 3.4%.  Echo was done today and reviewed, EF 55-60%, normal RV, bioprosthetic aortic valve with mean gradient 10 mmHg.  He is stable symptomatically.  Has some fatigue.  Walks on flat ground without dyspnea.  Has some trouble with  balance but no falls or lightheadedness.  No chest pain. No orthopnea/PND.   Labs (8/20): LDL 69, HDL 49, K 3.8, creatinine 1.22  Labs (9/20): K 4.2, creatinine 1.08 Labs (12/20): K 3.4, creatinine 1.22  Labs (3/21): K 4.4, creatinine 1.24 Labs (8/21): K 4.6, creatinine 0.93, LDL 84, HDL 50  PMH: 1. SVT 2. HTN 3. Prior ETOH abuse 4. Prior smoking 5. Chronic systolic CHF: Nonischemic cardiomyopathy.  - Echo (2004) with EF 20%.  - Echo (1/20): EF 25-30%, mild LVH, moderate LV dilation, bicuspid aortic valve with severe AS.  - RHC (2/20): mean RA 12, PA 70/32 mean 47, mean PCWP 30, CI 1.72, PVR 4.2.  - Echo (9/20): EF 40-45%, mild LVH, septal-lateral dyssynchrony, normal RV size and systolic function, bioprosthetic AVR functions normally.  - Echo (10/21): EF 55-60%, normal RV, bioprosthetic aortic valve with mean gradient 10 mmHg. 6. LBBB 7. CAD: LHC (2/20) witih 80% stenosis small D1, 50% stenosis moderate D2, 50% mid LCx, 50% distal RCA.  8. Biscuspid aortic valve disorder: Severe AS and dilated ascending aorta.   - 3/20 bioprosthetic AVR + supracoronary ascending aorta replacement.  9. PVCs: Zio patch 12/20 showed 15.9% PVCs, NSVT runs.  - Zio patch (4/21): 3.4% PVCs, multiple morphologies.   ROS: All systems negative except as listed in HPI, PMH and Problem List.  SH:  Social History   Socioeconomic History  . Marital status: Married    Spouse name: Not on file  . Number of children: 2  . Years of  education: Not on file  . Highest education level: Not on file  Occupational History  . 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 Smoking status: Former Smoker    Packs/day: 0.75    Years: 40.00    Pack years: 30.00    Types: Cigarettes    Start date: 02/07/1978    Quit date: 03/28/2018    Years since quitting: 1.6  . Smokeless tobacco: Never Used  Vaping Use  . Vaping Use: Never used  Substance and Sexual Activity  . Alcohol use: Yes    Alcohol/week:  1.0 standard drink    Types: 1 Glasses of wine per week  . Drug use: Never  . Sexual activity: Yes    Partners: Male  Other Topics Concern  . Not on file  Social History Narrative   Married, med Designer, multimedia.  Two children.     Social Determinants of Health   Financial Resource Strain: Medium Risk  . Difficulty of Paying Living Expenses: Somewhat hard  Food Insecurity: No Food Insecurity  . Worried About Charity fundraiser in the Last Year: Never true  . Ran Out of Food in the Last Year: Never true  Transportation Needs: No Transportation Needs  . Lack of Transportation (Medical): No  . Lack of Transportation (Non-Medical): No  Physical Activity:   . Days of Exercise per Week: Not on file  . Minutes of Exercise per Session: Not on file  Stress:   . Feeling of Stress : Not on file  Social Connections:   . Frequency of Communication with Friends and Family: Not on file  . Frequency of Social Gatherings with Friends and Family: Not on file  . Attends Religious Services: Not on file  . Active Member of Clubs or Organizations: Not on file  . Attends Archivist Meetings: Not on file  . Marital Status: Not on file  Intimate Partner Violence:   . Fear of Current or Ex-Partner: Not on file  . Emotionally Abused: Not on file  . Physically Abused: Not on file  . Sexually Abused: Not on file    FH:  Family History  Problem Relation Age of Onset  . Diabetes Mother   . Hypertension Mother   . Alzheimer's disease Mother   . Breast cancer Mother 58       breast  . Hypertension Father   . Cancer Father        laryngeal, former smoker  . Hypertension Sister   . Diabetes Son   . Diabetes Daughter      Current Outpatient Medications  Medication Sig Dispense Refill  . acetaminophen (TYLENOL) 500 MG tablet Take 1,000 mg by mouth every 6 (six) hours as needed for mild pain or headache.    . ALPRAZolam (XANAX) 0.5 MG tablet Take      1/2 - 1 tablet    at Bedtime  ONLY if needed  for Sleep  &  limit to 5 days /week to avoid Addiction & Dementia 30 tablet 0  . aspirin EC 81 MG tablet Take 81 mg by mouth daily. Swallow whole.    . busPIRone (BUSPAR) 10 MG tablet Take    1 tablet      3 x /day      for Chronic Anxiety & Mood 270 tablet 0  . Cholecalciferol (VITAMIN D3) 125 MCG (5000 UT) TABS Take 5,000 Units by mouth daily.     . empagliflozin (JARDIANCE) 10 MG TABS tablet Take  1 tablet (10 mg total) by mouth daily. 30 tablet 3  . eplerenone (INSPRA) 50 MG tablet Take 1 tablet by mouth once daily 90 tablet 3  . furosemide (LASIX) 20 MG tablet Take 1 tablet (20 mg total) by mouth daily. 90 tablet 3  . metoprolol succinate (TOPROL-XL) 100 MG 24 hr tablet Take 1 tablet (100 mg total) by mouth in the morning and at bedtime. Take with or immediately following a meal. 60 tablet 4  . mexiletine (MEXITIL) 250 MG capsule Take 1 capsule (250 mg total) by mouth 2 (two) times daily. 60 capsule 8  . pantoprazole (PROTONIX) 40 MG tablet Take 1 tablet (40 mg total) by mouth daily. 30 tablet 11  . potassium chloride (KLOR-CON) 10 MEQ tablet Take 1 tablet (10 mEq total) by mouth daily. 90 tablet 3  . rosuvastatin (CRESTOR) 40 MG tablet Take 1 tablet (40 mg total) by mouth daily. 90 tablet 3  . sacubitril-valsartan (ENTRESTO) 97-103 MG Take 1 tablet by mouth 2 (two) times daily. 180 tablet 3  . sertraline (ZOLOFT) 100 MG tablet Take 1 tablet (100 mg total) by mouth daily. 90 tablet 3   No current facility-administered medications for this encounter.    Vitals:   12/04/19 1013  BP: (!) 144/88  Pulse: 84  SpO2: 95%  Weight: 118.4 kg (261 lb)   Wt Readings from Last 3 Encounters:  12/04/19 118.4 kg (261 lb)  11/11/19 116.3 kg (256 lb 6.4 oz)  09/03/19 (!) 117 kg (258 lb)    PHYSICAL EXAM: General: NAD Neck: No JVD, no thyromegaly or thyroid nodule.  Lungs: Clear to auscultation bilaterally with normal respiratory effort. CV: Nondisplaced PMI.  Heart regular S1/S2, no S3/S4, no  murmur.  No peripheral edema.  No carotid bruit.  Normal pedal pulses.  Abdomen: Soft, nontender, no hepatosplenomegaly, no distention.  Skin: Intact without lesions or rashes.  Neurologic: Alert and oriented x 3.  Psych: Normal affect. Extremities: No clubbing or cyanosis.  HEENT: Normal.   ASSESSMENT & PLAN: 1. Chronic systolic CHF: Nonischemic cardiomyopathy. This pre-existed severe AS, ?if PVCs play a role.  Echo in 9/20 showed EF up to 40-45%, and echo today showed EF up to 55-60%.  He is not volume overloaded on exam, NYHA class II.  - Continue Entresto 97/103 bid. BMET today.  - Toprol XL 100 mg bid.   - Continue eplerenone 50 mg daily (painful gynecomastia with spironolactone, now resolved).   - Continue Lasix 20 mg daily.  Continue KCl 10 daily.    - Continue empagliflozin 10 mg daily.  - EF is now out of ICD range.  If EF were to fall in the future, would be CRT candidate with LBBB.  2. Bicuspid aortic valve disorder: Severe AS, now s/p surgical AVR (annulus too large for TAVR) and ascending aorta replacement.  Echo in 10/21 showed a normally functioning bioprosthetic aortic valve.  - He will need endocarditis prophylaxis with dental work.  3. ETOH abuse: Rarely drinks alcohol now.  4. PVCs: 15.9% PVCs on 12/20 Zio patch (symptomatic).  Amiodarone started with improvement.  To prevent long-term amiodarone use, he was transitioned to mexiletine. Repeat Zio patch in 4/21 showed PVC burden down to 3.4%.  Multiple morphologies of PVCs, would not be amenable to ablation.   - Continue mexiletine.  5. CAD: Moderate nonobstructive CAD on 2/20 cath.  No chest pain.  I do not think that this contributed to his cardiomyopathy appreciably.  - Continue Crestor, check lipids.  -  Continue ASA.   6. LBBB: Chronic.   Followup in 6 months  Loralie Champagne 12/04/2019

## 2019-12-04 NOTE — Patient Instructions (Signed)
Labs done today, your results will be available in MyChart, we will contact you for abnormal readings.  Please call our office in March 2022 to schedule your follow up appointment  If you have any questions or concerns before your next appointment please send [place] a message through [place] or call our office at [phone].    TO LEAVE A MESSAGE FOR THE NURSE SELECT OPTION 2, PLEASE LEAVE A MESSAGE INCLUDING: . YOUR NAME . DATE OF BIRTH . CALL BACK NUMBER . REASON FOR CALL**this is important as we prioritize the call backs  [place] AS LONG AS YOU CALL BEFORE 4:00 PM  At the [facility], you and your health needs are our priority. As part of our continuing mission to provide you with exceptional heart care, we have created designated Provider Care Teams. These Care Teams include your primary Cardiologist (physician) and Advanced Practice Providers (APPs- Physician Assistants and Nurse Practitioners) who all work together to provide you with the care you need, when you need it.   You may see any of the following providers on your designated Care Team at your next follow up: Marland Kitchen Dr Glori Bickers . Dr Loralie Champagne . Darrick Grinder, NP . Lyda Jester, PA . Audry Riles, PharmD   Please be sure to bring in all your medications bottles to every appointment.

## 2019-12-04 NOTE — Addendum Note (Signed)
Encounter addended by: Larey Dresser, MD on: 12/04/2019 10:42 PM  Actions taken: Clinical Note Signed, Level of Service modified, Visit diagnoses modified

## 2019-12-05 ENCOUNTER — Telehealth (HOSPITAL_COMMUNITY): Payer: Self-pay

## 2019-12-05 DIAGNOSIS — I5042 Chronic combined systolic (congestive) and diastolic (congestive) heart failure: Secondary | ICD-10-CM

## 2019-12-05 MED ORDER — EZETIMIBE 10 MG PO TABS: 10.0000 mg | ORAL_TABLET | Freq: Every day | ORAL | 3 refills | Status: AC

## 2019-12-05 NOTE — Telephone Encounter (Signed)
-----   Message from Larey Dresser, MD sent at 12/04/2019  2:59 PM EDT ----- LDL still above goal (<70 with diabetes).  If he is taking Crestor 40 mg daily, then need to add Zetia 10 mg daily.  Lipids/LFTs in 2 months.

## 2019-12-05 NOTE — Telephone Encounter (Signed)
Pt aware of results and recommendations. Pt taking crestor 40mg  daily. Advised to add Zetia 10mg  daily and repeat labs in 2 months. Pt amenable to plan.  Verbalized understanding.

## 2019-12-18 ENCOUNTER — Other Ambulatory Visit: Payer: Self-pay | Admitting: Internal Medicine

## 2019-12-19 MED FILL — METOPROLOL SUCCINATE ER 100: 100 | 30 days supply | Qty: 60 | Fill #1

## 2019-12-19 MED FILL — JARDIANCE 10 MG TABLET: 10 | 30 days supply | Qty: 30 | Fill #2

## 2020-01-05 ENCOUNTER — Other Ambulatory Visit: Payer: Self-pay | Admitting: Internal Medicine

## 2020-01-05 DIAGNOSIS — E785 Hyperlipidemia, unspecified: Secondary | ICD-10-CM

## 2020-01-05 MED ORDER — ROSUVASTATIN CALCIUM 40 MG PO TABS: ORAL_TABLET | ORAL | 0 refills | Status: DC

## 2020-01-09 ENCOUNTER — Other Ambulatory Visit (HOSPITAL_COMMUNITY): Payer: Self-pay | Admitting: Cardiology

## 2020-01-16 MED FILL — JARDIANCE 10 MG TABLET: 10 | 30 days supply | Qty: 30 | Fill #3

## 2020-01-16 MED FILL — METOPROLOL SUCCINATE ER 100: 100 | 30 days supply | Qty: 60 | Fill #2

## 2020-01-20 ENCOUNTER — Other Ambulatory Visit (HOSPITAL_COMMUNITY): Payer: Self-pay | Admitting: Cardiology

## 2020-02-06 ENCOUNTER — Ambulatory Visit (HOSPITAL_COMMUNITY)
Admission: RE | Admit: 2020-02-06 | Discharge: 2020-02-06 | Disposition: A | Discharge status: Home / Self Care | Payer: Self-pay | Source: Ambulatory Visit | Attending: Cardiology | Admitting: Cardiology

## 2020-02-06 ENCOUNTER — Other Ambulatory Visit: Payer: Self-pay

## 2020-02-06 DIAGNOSIS — I5042 Chronic combined systolic (congestive) and diastolic (congestive) heart failure: Secondary | ICD-10-CM | POA: Insufficient documentation

## 2020-02-06 LAB — LIPID PANEL
Cholesterol: 133 mg/dL (ref 0–200)
HDL: 47 mg/dL (ref >40)
LDL Cholesterol: 56 mg/dL (ref 0–99)
Total CHOL/HDL Ratio: 2.8 RATIO
Triglycerides: 152 mg/dL — ABNORMAL HIGH (ref <150)
VLDL: 30 mg/dL (ref 0–40)

## 2020-02-06 LAB — HEPATIC FUNCTION PANEL
ALT: 32 U/L (ref 0–44)
AST: 29 U/L (ref 15–41)
Albumin: 4 g/dL (ref 3.5–5.0)
Alkaline Phosphatase: 54 U/L (ref 38–126)
Bilirubin, Direct: 0.2 mg/dL (ref 0.0–0.2)
Indirect Bilirubin: 0.7 mg/dL (ref 0.3–0.9)
Total Bilirubin: 0.9 mg/dL (ref 0.3–1.2)
Total Protein: 7 g/dL (ref 6.5–8.1)

## 2020-02-10 ENCOUNTER — Encounter (HOSPITAL_COMMUNITY): Payer: Self-pay | Admitting: Cardiology

## 2020-02-17 ENCOUNTER — Other Ambulatory Visit (HOSPITAL_COMMUNITY): Payer: Self-pay | Admitting: Cardiology

## 2020-02-17 MED ORDER — EMPAGLIFLOZIN 10 MG PO TABS: ORAL_TABLET | ORAL | 3 refills | Status: DC

## 2020-02-17 MED ORDER — METOPROLOL SUCCINATE ER 100 MG PO TB24
100.0000 mg | ORAL_TABLET | Freq: Two times a day (BID) | ORAL | 4 refills | Status: DC
Start: 2020-02-17 — End: 2020-02-17

## 2020-02-17 MED FILL — METOPROLOL SUCCINATE ER 100: 100 | 30 days supply | Qty: 60 | Fill #0

## 2020-02-17 MED FILL — JARDIANCE 10 MG TABLET: 10 | 30 days supply | Qty: 30 | Fill #0

## 2020-02-17 NOTE — Addendum Note (Signed)
Addended by: Jeannene Patella on: 02/17/2020 09:31 AM   Modules accepted: Orders

## 2020-02-18 ENCOUNTER — Other Ambulatory Visit: Payer: Self-pay

## 2020-02-18 MED ORDER — SERTRALINE HCL 100 MG PO TABS: 100.0000 mg | ORAL_TABLET | Freq: Every day | ORAL | 3 refills | Status: DC

## 2020-02-20 ENCOUNTER — Telehealth (HOSPITAL_COMMUNITY): Payer: Self-pay | Admitting: Licensed Clinical Social Worker

## 2020-02-20 ENCOUNTER — Other Ambulatory Visit (HOSPITAL_COMMUNITY): Payer: Self-pay | Admitting: *Deleted

## 2020-02-20 ENCOUNTER — Other Ambulatory Visit (HOSPITAL_COMMUNITY): Payer: Self-pay | Admitting: Cardiology

## 2020-02-20 MED ORDER — MEXILETINE HCL 250 MG PO CAPS
250.0000 mg | ORAL_CAPSULE | Freq: Two times a day (BID) | ORAL | 3 refills | Status: DC
Start: 2020-02-20 — End: 2020-02-20

## 2020-02-20 NOTE — Telephone Encounter (Signed)
CSW received call from pt stating he was having trouble paying for his Mexiletine.  States that last year it was only about $40/month and is now $95/month at [employer].  Pt uses Goodrx card and CSW verified that is the new cost through their coupon with [employer].  CSW consulted pharmacy team to see if there was a cheaper alternative to the medication or other options.  No other medications they can try for patient but Patient Advocate was able to verify that [facility] can get medication for reduced cost and pt would only pay $12/month at our Outpatient Pharmacy- pt is in agreement to this plan- [place] clinic staff to send over medication.  Will continue to follow and assist as needed  [name], [facility] Desk#: [phone] Cell#: [phone]

## 2020-02-21 MED FILL — MEXILETINE 250 MG CAPSULE: 250 | 30 days supply | Qty: 60 | Fill #0

## 2020-02-25 ENCOUNTER — Other Ambulatory Visit: Payer: Self-pay | Admitting: Internal Medicine

## 2020-02-25 MED ORDER — ALPRAZOLAM 0.5 MG PO TABS
ORAL_TABLET | ORAL | 0 refills | Status: DC
Start: 2020-02-25 — End: 2021-12-28

## 2020-03-18 MED FILL — METOPROLOL SUCCINATE ER 100: 100 | 30 days supply | Qty: 60 | Fill #1

## 2020-03-18 MED FILL — MEXILETINE 250 MG CAPSULE: 250 | 30 days supply | Qty: 60 | Fill #1

## 2020-03-18 MED FILL — JARDIANCE 10 MG TABLET: 10 | 30 days supply | Qty: 30 | Fill #1

## 2020-03-19 ENCOUNTER — Telehealth (HOSPITAL_COMMUNITY): Payer: Self-pay | Admitting: Licensed Clinical Social Worker

## 2020-03-19 NOTE — Telephone Encounter (Signed)
CSW received call from pt requesting help figuring out how to apply for Medicaid.  Pt attempted to find out how online but was unsuccessful.  CSW able to email pt link to [employer] website where he can create an account and apply.  Encouraged pt to reach out if he had any issues.  Will continue to follow and assist as needed  [name], [facility] Desk#: [phone] Cell#: [phone]

## 2020-03-31 ENCOUNTER — Other Ambulatory Visit (HOSPITAL_COMMUNITY): Payer: Self-pay | Admitting: Cardiology

## 2020-04-09 ENCOUNTER — Other Ambulatory Visit (HOSPITAL_COMMUNITY): Payer: Self-pay | Admitting: *Deleted

## 2020-04-09 ENCOUNTER — Other Ambulatory Visit: Payer: Self-pay | Admitting: Internal Medicine

## 2020-04-09 DIAGNOSIS — F419 Anxiety disorder, unspecified: Secondary | ICD-10-CM

## 2020-04-09 MED ORDER — FUROSEMIDE 20 MG PO TABS
20.0000 mg | ORAL_TABLET | Freq: Every day | ORAL | 3 refills | Status: DC
Start: 2020-04-09 — End: 2021-02-22

## 2020-04-22 ENCOUNTER — Other Ambulatory Visit (HOSPITAL_COMMUNITY): Payer: Self-pay | Admitting: Cardiology

## 2020-05-02 IMAGING — CR DG CHEST 2V
2 series · 2 of 2 positions shown · non-contrast
Comparison: Chest radiograph 09/24/2008

CLINICAL DATA: Shortness of breath.

EXAM:
CHEST - 2 VIEW

[chest lat]
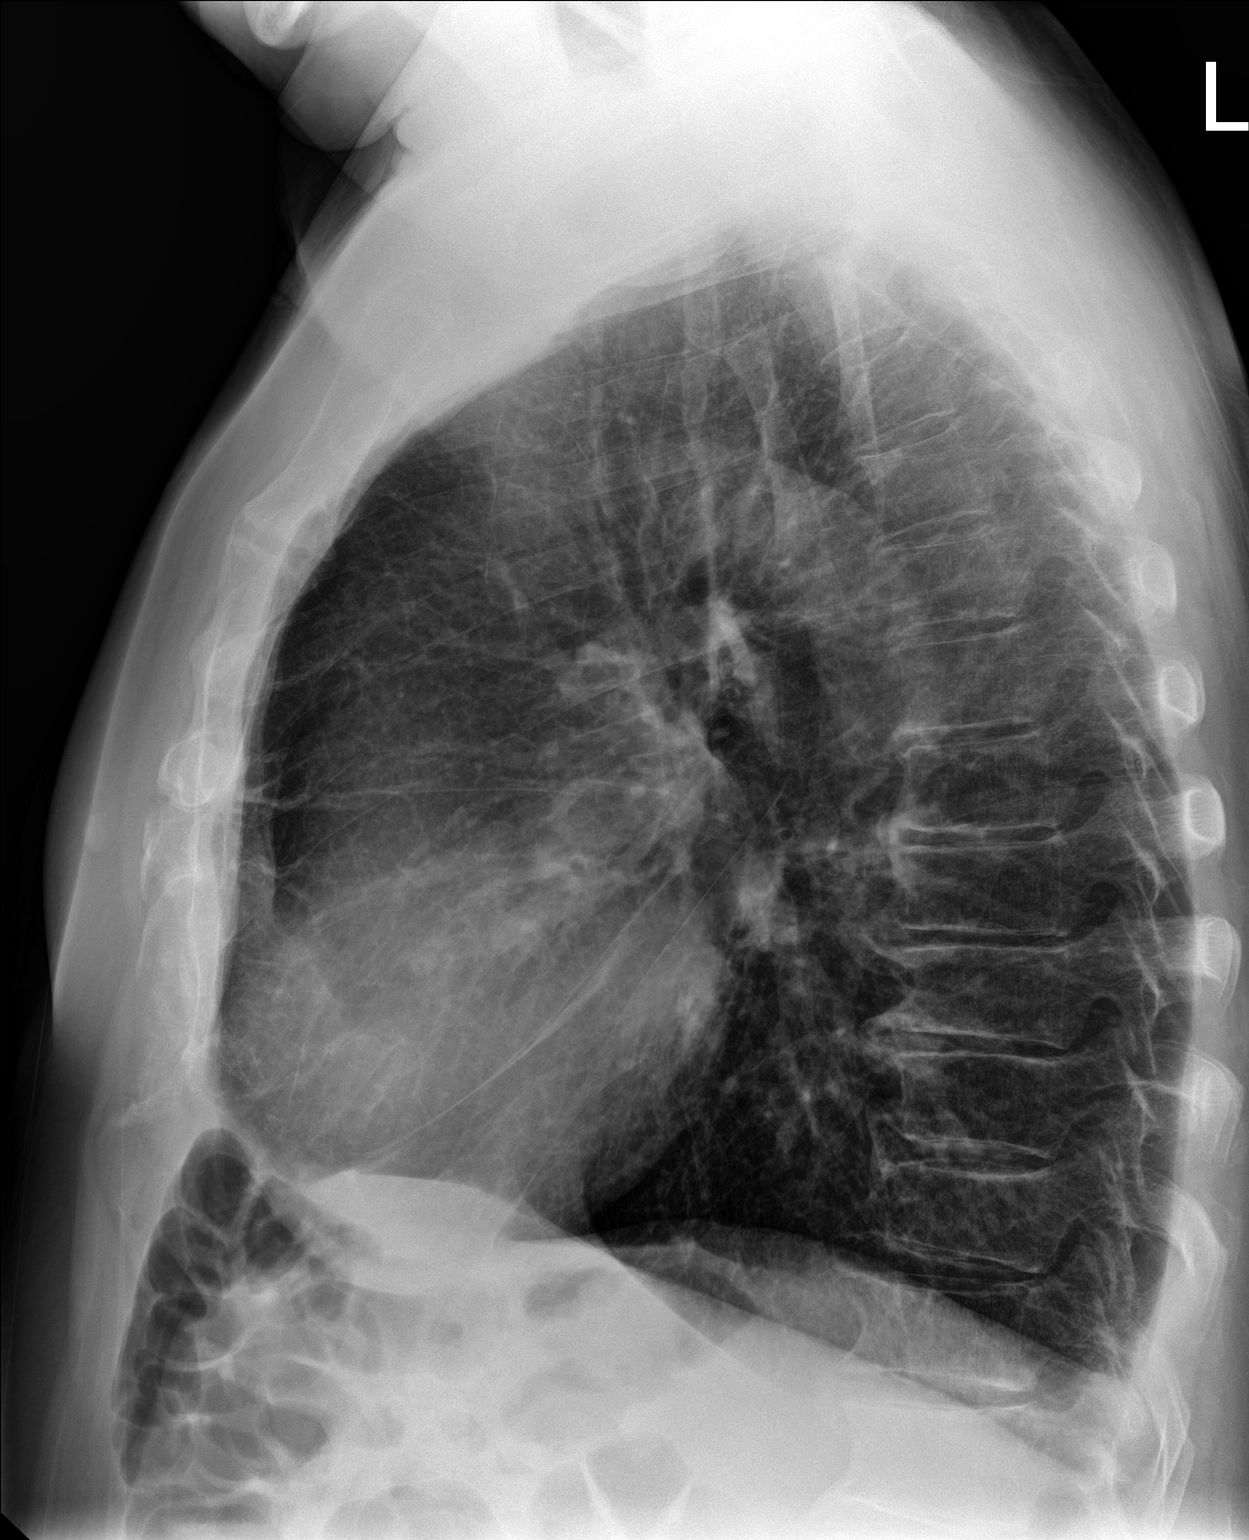

[chest ap]
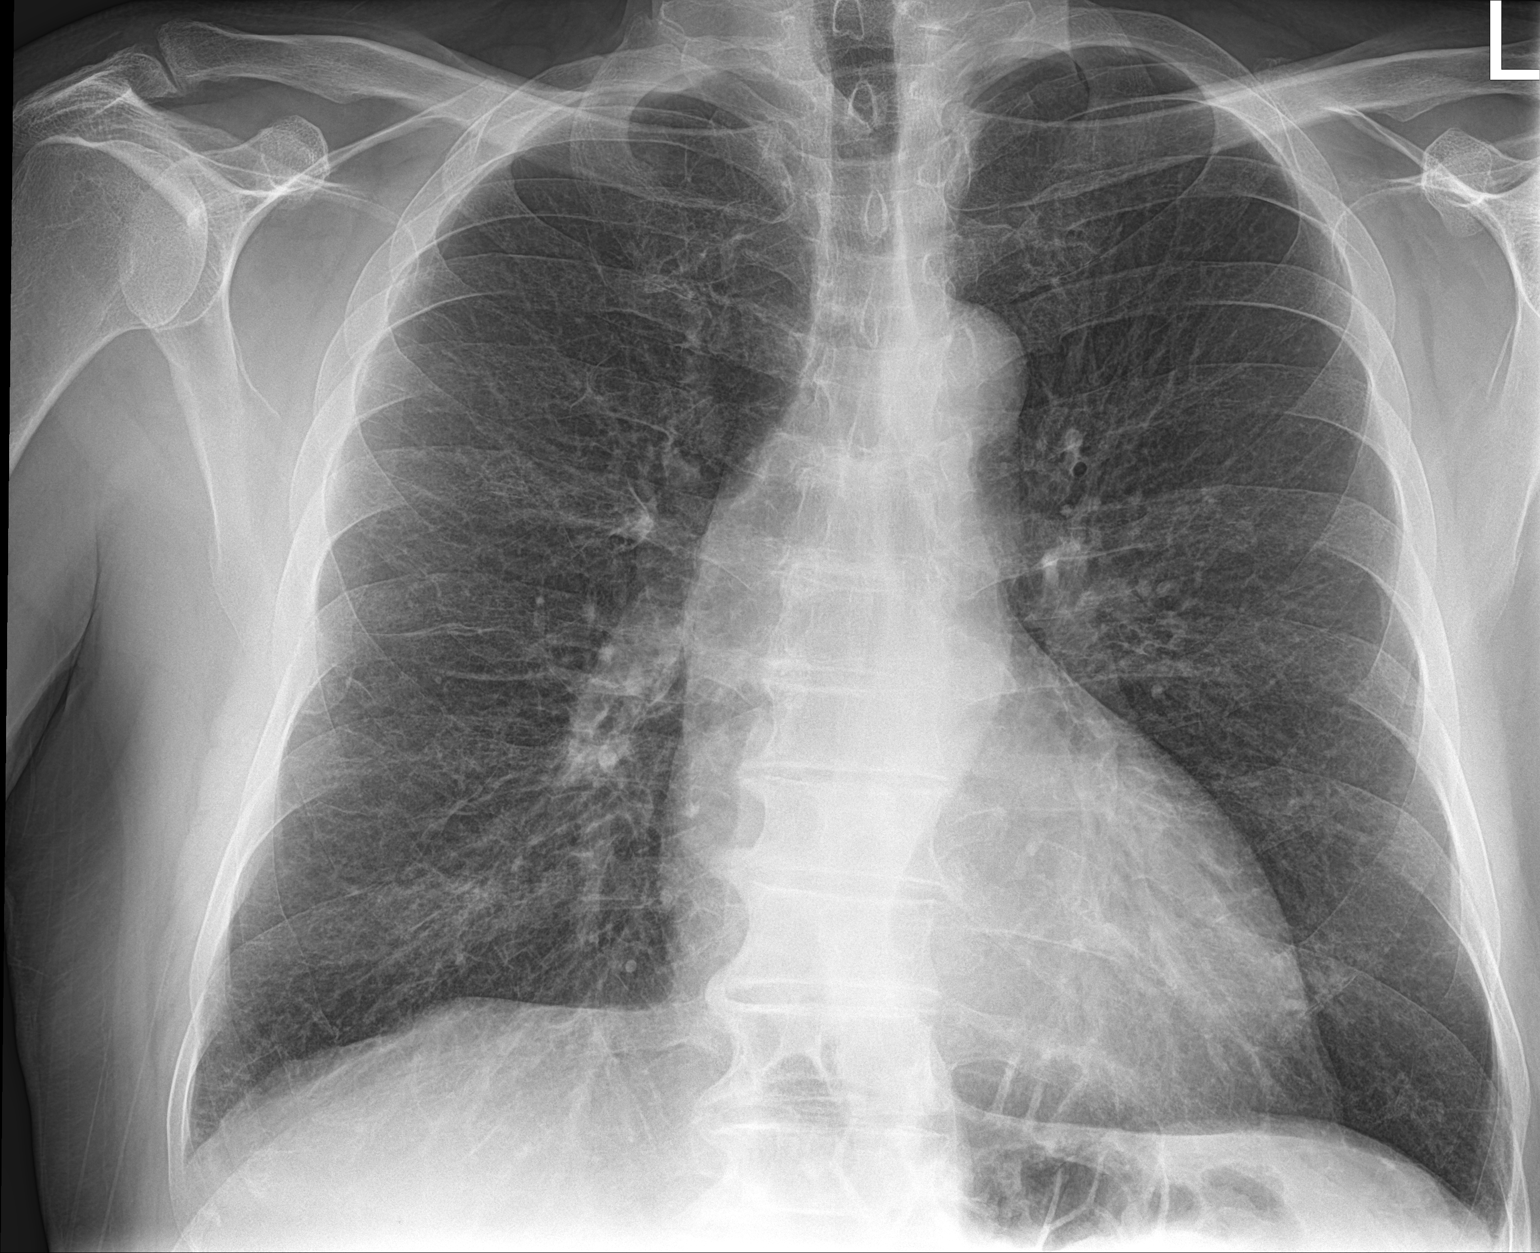

[2 of 2 positions shown; findings below may reference images not displayed]

FINDINGS: Stable enlarged cardiac and mediastinal contours. Tortuosity of the
thoracic aorta. No consolidative pulmonary opacities. No pleural
effusion or pneumothorax. Pulmonary hyperinflation. Thoracic spine
degenerative changes.
IMPRESSION: No acute cardiopulmonary process.

## 2020-05-11 NOTE — Progress Notes (Signed)
Patient ID: Tyler Berger, male   DOB: October 20, 1960, 60 y.o.   MRN: [id number]  6 MONTH FOLLOW UP  Assessment and Plan:   Essential hypertension Continue medications Monitor blood pressure at home; call if consistently over 130/80 Continue DASH diet.   Reminder to go to the ER if any CP, SOB, nausea, dizziness, severe HA, changes vision/speech, left arm numbness and tingling and jaw pain.  Chronic combined systolic and diastolic congestive heart failure [facility]) Cardiology managing Continue to maximize medications/blood pressure LONG discussion about sodium/sugar - he has done very well to reduce Excellent progress with weight loss commended- continue Weight daily and bring log of weight and BP to heart failure clinic  Non-ischemic cardiomyopathy ([place]) Try to maximize medications/blood pressure Excellent progress with weight loss - commended,  WEIGH DAILY and bring log of weight and BP to heart failure clinic  SVT (supraventricular tachycardia) (HCC) NSR at this time, remains controlled with meds; cardiology following   PVC (premature ventricular contraction) NSR at this time, recent Zio with burden down to 3.4% Off of amiodarone; cardiology following  S/P aortic valve replacement with bioprosthetic valve + resection ascending thoracic aortic aneurysm Continue cardiology follow up  Hyperlipidemia, unspecified hyperlipidemia type Continue rosuvastatin; defer lipids to cardiology who has been checking, no insurance, has upcoming planning decrease fatty foods increase activity.   Abnormal glucose (prediabetes) Discussed disease and risks; on jardiance for heart/sugars  Discussed diet/exercise, weight management  Given glucometer sample and strips, can check PRN fasting  Fasting has been well controlled Weight down 32 lb today from last with carbohydrate reduction No insurance; will defer A1C to annual check   Anxiety Continue zoloft, buspar and xanax AS needed Has done well  reducing xanax use Financial stress significant - pending disability  Stress management techniques discussed, increase water, good sleep hygiene discussed, increase exercise, and increase veggies.   Vitamin D deficiency Continue supplement   Former smoker Quit smoking, congratulated the patient and encouraged him to continue.  30 pack year; once he has insurance will discuss low dose CT; denies concerning sx today   Defer labs today, no insurance, sig financial burden, no recent med changes, excellent weight loss progress; cardiology follows for lipids; will recheck full labs with new insurance  Continue diet and meds as discussed. Further disposition pending results of labs. Future Appointments  Date Time Provider Muse  11/10/2020  2:00 PM Liane Comber, NP [facility] None    HPI 60 y.o. male  presents for 6 month follow up on CHF, nonischemic cardiomyopathy, htn, hyperlipidemia, prediabetes, obesity, vit D def.   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 Currently uninsured and requests selective labs.   Patient is s/p aortic valve replacement with biosynthetic valve and thoracic aortic aneurysm repair on 04/10/2018 by Dr. Roxy Manns, has systolic heart failure with post op TEE showing EF 35-40%. He was placed on amiodarone due to PVCs with improvement, then transitioned to mexiletine, toprol, entresto. Following with Dr. Aundra Dubin at the heart failure clinic. Echo 11/2019 showed EF up to 50-55%, normally functioning valve, grade 1 diastolic dysfunction. Mod nonobstructive CAD per 03/2018 cath. Zio patch in 4/21 showed PVC burden down to 3.4%. He has been stable generally, has noted improved exercise tolerance, walking more. Does continue to have intermittent dizzy episodes. Getting help from [facility], getting meds from  them, cardiology is managing. Per their recent note, planning on lipid panel at upcoming visit.   He has depression/anxiety, he lost his best friend in 2020 to massive MI, stress r/t disability and finances. He is on zoloft 100 mg, buspar 10 mg TID which has helped, xanax 0.25 mg occasionally in the evenings, no longer taking daily. Formerly on wellbutrin, was stopped due to ? Seizure at [employer]. Feels fairly controlled.  BMI is Body mass index is 31.25 kg/m., he is working on diet and exercise., trying to eat healthy, cutting processed carbs, oatmeal, lean proteins, fruits/veggies, watches sodium closely. Was formerly very active, went to GYM daily, slowly trying to increase now walking around the block in his neighborhood at least every other day.  4-5 bottles a day of water  Wt Readings from Last 3 Encounters:  05/12/20 232 lb (105.2 kg)  12/04/19 261 lb (118.4 kg)  11/11/19 256 lb 6.4 oz (116.3 kg)   His blood pressure has been controlled at home, today their BP is BP: 122/70  He does workout, walking. He denies chest pain, shortness of breath, dizziness, edema, PND.   He is on cholesterol medication, crestor 40 mg daily, zetia 10 mg daily and denies myalgias. His cholesterol is at goal less than 70. The cholesterol last visit was:   Lab Results  Component Value Date   CHOL 133 02/06/2020   HDL 47 02/06/2020   LDLCALC 56 02/06/2020   TRIG 152 (H) 02/06/2020   CHOLHDL 2.8 02/06/2020    He has been working on diet and exercise for prediabetes, and denies increased appetite, nausea, paresthesia of the feet, polydipsia, polyuria, visual disturbances, vomiting and weight loss.  On Jardiance via cardiology for heart -  Doesn't have glucometer but would like one - sample given due to no insurance  Last A1C in the office was:  Lab Results  Component Value Date   HGBA1C 5.8 (H) 11/11/2019    Patient is on Vitamin D supplement.   Lab Results  Component Value Date   VD25OH 40  04/22/2013      Current Medications:   Current Outpatient Medications (Endocrine & Metabolic):  .  empagliflozin (JARDIANCE) 10 MG TABS tablet, TAKE 1 TABLET (10 MG TOTAL) BY MOUTH DAILY. Marland Kitchen  empagliflozin (JARDIANCE) 10 MG TABS tablet, TAKE 1 TABLET (10 MG TOTAL) BY MOUTH DAILY.  Current Outpatient Medications (Cardiovascular):  .  eplerenone (INSPRA) 50 MG tablet, Take 1 tablet by mouth once daily .  ezetimibe (ZETIA) 10 MG tablet, Take 1 tablet (10 mg total) by mouth daily. .  furosemide (LASIX) 20 MG tablet, Take 1 tablet (20 mg total) by mouth daily. .  metoprolol succinate (TOPROL-XL) 100 MG 24 hr tablet, TAKE 1 TABLET (100 MG TOTAL) BY MOUTH IN THE MORNING AND AT BEDTIME. TAKE WITH OR IMMEDIATELY FOLLOWING A MEAL. Marland Kitchen  mexiletine (MEXITIL) 250 MG capsule, TAKE 1 CAPSULE BY MOUTH 2 TIMES DAILY .  rosuvastatin (CRESTOR) 40 MG tablet, Take      1 tablet      Daily       for Cholesterol .  sacubitril-valsartan (ENTRESTO) 97-103 MG, Take 1 tablet by mouth 2 (two) times daily.   Current Outpatient Medications (Analgesics):  .  acetaminophen (TYLENOL) 500 MG tablet, Take 1,000 mg by mouth every 6 (six) hours as needed for mild pain or headache. Marland Kitchen  aspirin EC 81 MG tablet, Take 81 mg by mouth daily. Swallow whole.   Current Outpatient Medications (Other):  .  ALPRAZolam (XANAX) 0.5 MG tablet, TAKE 1/2-1 TABLET BY MOUTH AT BEDTIME ONLY IF NEEDED FOR SLEEP AND LIMIT TO 5 DAYS A WEEK TO AVOID ADDICTION AND DEMENTIA .  busPIRone (BUSPAR) 10 MG tablet, TAKE 1 TABLET BY MOUTH THREE TIMES DAILY FOR CHRONIC ANXIETY AND MOOD .  Cholecalciferol (VITAMIN D3) 125 MCG (5000 UT) TABS, Take 5,000 Units by mouth daily.  .  pantoprazole (PROTONIX) 40 MG tablet, Take 1 tablet (40 mg total) by mouth daily. .  potassium chloride (KLOR-CON) 10 MEQ tablet, Take 1 tablet (10 mEq total) by mouth daily. Please make an appointment for further refills .  sertraline (ZOLOFT) 100 MG tablet, Take 1 tablet (100 mg total)  by mouth daily.  Medical History:  Past Medical History:  Diagnosis Date  . Anxiety   . 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 Aortic stenosis   . Ascending aortic aneurysm ([place])   . CAD (coronary artery disease)   . Chronic combined systolic and diastolic congestive heart failure ([place])   . Dilated aortic root ([place])   . Hypertension   . Non-ischemic cardiomyopathy ([place])   . Nonischemic cardiomyopathy ([place])   . S/P aortic valve replacement with bioprosthetic valve 04/10/2018   29 mm Edwards Inspiris Resilia stented bovine pericardial tissue valve  . S/P ascending aortic aneurysm repair 04/10/2018   26 mm Hemashield Platinum supracoronary straight graft  . SVT (supraventricular tachycardia) ([place])   . Viral myocarditis 2004  . Vitamin D deficiency    Allergies No Known Allergies   SURGICAL HISTORY He  has a past surgical history that includes Hernia repair (Left, 2010); Orchiectomy (Left, 1982); Varicose vein surgery (Left, 05/1998); RIGHT HEART CATH AND CORONARY ANGIOGRAPHY (N/A, 03/28/2018); Multiple extractions with alveoloplasty (N/A, 04/05/2018); Ascending aortic root replacement (N/A, 04/10/2018); TEE without cardioversion (N/A, 04/10/2018); and Aortic valve replacement (N/A, 04/10/2018). FAMILY HISTORY His family history includes Alzheimer's disease in his mother; Breast cancer (age of onset: 24) in his mother; Cancer in his father; Diabetes in his daughter, mother, and son; Hypertension in his father, mother, and sister. SOCIAL HISTORY He  reports that he quit smoking about 2 years ago. His smoking use included cigarettes. He started smoking about 42 years ago. He has a 30.00 pack-year smoking history. He has never used smokeless tobacco. He reports current alcohol use of about 1.0 standard drink of alcohol per week. He reports that he does not use drugs.   Review of Systems  Constitutional: Positive for malaise/fatigue. Negative for weight loss.  HENT: Negative for hearing loss and tinnitus.    Eyes: Negative for blurred vision and double vision.  Respiratory: Negative for cough, shortness of breath and wheezing.   Cardiovascular: Negative for chest pain, palpitations, orthopnea, claudication and leg swelling.  Gastrointestinal: Negative for abdominal pain, blood in stool, constipation, diarrhea, heartburn, melena, nausea and vomiting.  Genitourinary: Negative.   Musculoskeletal: Negative for joint pain and myalgias.  Skin: Negative for rash.  Neurological: Negative for dizziness, tingling, sensory change, weakness and headaches.  Endo/Heme/Allergies: Negative for polydipsia.  Psychiatric/Behavioral: Negative for depression, substance abuse and suicidal ideas. The patient is nervous/anxious and has insomnia.   All other systems reviewed and are negative.   Physical Exam: BP 122/70   Pulse 85   Temp (!) 97.2 F (36.2 C)   Ht 6' 0.25" (1.835 m)   Wt 232 lb (105.2 kg)   SpO2 95%   BMI 31.25 kg/m  Wt Readings from Last 3 Encounters:  05/12/20 232 lb (105.2 kg)  12/04/19 261 lb (118.4 kg)  11/11/19 256 lb 6.4 oz (116.3 kg)  Body mass index is 31.25 kg/m.  General Appearance: Well nourished, well dressed male in no acute distress. Eyes: PERRLA, EOMs, conjunctiva no swelling or erythema Sinuses: No Frontal/maxillary tenderness ENT/Mouth: Ext aud canals clear, TMs without erythema, bulging. No erythema, swelling, or exudate on post pharynx.  Tonsils not swollen or erythematous. Hearing normal.  Neck: Supple, thyroid normal.  Respiratory: Respiratory effort normal, BS equal bilaterally without rales, rhonchi, wheezing or stridor.  Cardio: Regular rate and rhythm, no murmurs, rubs or gallops.  Brisk peripheral pulses without significant edmea;  large torturous varicose veins bilateral legs, non-tender.  Abdomen: Soft, + BS.  Non tender, no guarding, rebound, hernias, masses. Lymphatics: Non tender without lymphadenopathy.  Musculoskeletal: Full ROM, 5/5 strength, normal gait.   Skin: Warm, dry without rashes, lesions, ecchymosis.  Neuro: Cranial nerves intact. Normal muscle tone, no cerebellar symptoms.  Psych: Awake and oriented X 3, mildly anxious affect, Insight and Judgment appropriate.    Andi Layfield C Keniah Klemmer 11:00 AM

## 2020-05-12 ENCOUNTER — Other Ambulatory Visit: Payer: Self-pay

## 2020-05-12 ENCOUNTER — Encounter: Payer: Self-pay | Admitting: Adult Health

## 2020-05-12 ENCOUNTER — Ambulatory Visit: Payer: Self-pay | Admitting: Adult Health

## 2020-05-12 VITALS — BP 122/70 | HR 85 | Temp 97.2°F | Ht 72.25 in | Wt 232.0 lb

## 2020-05-12 DIAGNOSIS — I10 Essential (primary) hypertension: Secondary | ICD-10-CM

## 2020-05-12 DIAGNOSIS — I428 Other cardiomyopathies: Secondary | ICD-10-CM

## 2020-05-12 DIAGNOSIS — R7309 Other abnormal glucose: Secondary | ICD-10-CM

## 2020-05-12 DIAGNOSIS — I5042 Chronic combined systolic (congestive) and diastolic (congestive) heart failure: Secondary | ICD-10-CM

## 2020-05-12 DIAGNOSIS — E785 Hyperlipidemia, unspecified: Secondary | ICD-10-CM

## 2020-05-12 DIAGNOSIS — I471 Supraventricular tachycardia: Secondary | ICD-10-CM

## 2020-05-12 DIAGNOSIS — E559 Vitamin D deficiency, unspecified: Secondary | ICD-10-CM

## 2020-05-12 DIAGNOSIS — I2510 Atherosclerotic heart disease of native coronary artery without angina pectoris: Secondary | ICD-10-CM

## 2020-05-12 DIAGNOSIS — E669 Obesity, unspecified: Secondary | ICD-10-CM

## 2020-05-12 DIAGNOSIS — F419 Anxiety disorder, unspecified: Secondary | ICD-10-CM

## 2020-05-12 DIAGNOSIS — Z79899 Other long term (current) drug therapy: Secondary | ICD-10-CM

## 2020-05-20 ENCOUNTER — Other Ambulatory Visit (HOSPITAL_COMMUNITY): Payer: Self-pay | Admitting: Cardiology

## 2020-05-20 ENCOUNTER — Other Ambulatory Visit (HOSPITAL_COMMUNITY): Payer: Self-pay

## 2020-05-20 MED FILL — Metoprolol Succinate Tab ER 24HR 100 MG (Tartrate Equiv): ORAL | 30 days supply | Qty: 60 | Fill #0 | Status: AC

## 2020-05-20 MED FILL — Mexiletine HCl Cap 250 MG: ORAL | 30 days supply | Qty: 60 | Fill #0 | Status: AC

## 2020-05-20 MED FILL — Empagliflozin Tab 10 MG: ORAL | 30 days supply | Qty: 30 | Fill #0 | Status: AC

## 2020-05-21 ENCOUNTER — Other Ambulatory Visit (HOSPITAL_COMMUNITY): Payer: Self-pay

## 2020-06-21 ENCOUNTER — Other Ambulatory Visit (HOSPITAL_COMMUNITY): Payer: Self-pay | Admitting: Cardiology

## 2020-06-21 MED FILL — Metoprolol Succinate Tab ER 24HR 100 MG (Tartrate Equiv): ORAL | 30 days supply | Qty: 60 | Fill #1 | Status: AC

## 2020-06-22 ENCOUNTER — Other Ambulatory Visit (HOSPITAL_COMMUNITY): Payer: Self-pay | Admitting: Cardiology

## 2020-06-22 ENCOUNTER — Other Ambulatory Visit (HOSPITAL_COMMUNITY): Payer: Self-pay

## 2020-06-22 MED ORDER — EMPAGLIFLOZIN 10 MG PO TABS
10.0000 mg | ORAL_TABLET | Freq: Every day | ORAL | 3 refills | Status: DC
  Filled 2020-06-24: qty 30, 30d supply, fill #0

## 2020-06-22 MED ORDER — MEXILETINE HCL 250 MG PO CAPS
ORAL_CAPSULE | Freq: Two times a day (BID) | ORAL | 3 refills | Status: DC
  Filled 2020-06-22: qty 60, 30d supply, fill #0
  Filled 2020-07-20: qty 60, 30d supply, fill #1
  Filled 2020-08-25: qty 60, 30d supply, fill #2
  Filled 2020-09-28: qty 54, 27d supply, fill #3
  Filled 2020-09-28: qty 6, 3d supply, fill #3

## 2020-06-23 ENCOUNTER — Other Ambulatory Visit (HOSPITAL_COMMUNITY): Payer: Self-pay

## 2020-06-24 ENCOUNTER — Other Ambulatory Visit (HOSPITAL_COMMUNITY): Payer: Self-pay

## 2020-06-25 ENCOUNTER — Other Ambulatory Visit (HOSPITAL_COMMUNITY): Payer: Self-pay

## 2020-06-25 ENCOUNTER — Telehealth (HOSPITAL_COMMUNITY): Payer: Self-pay | Admitting: Pharmacy Technician

## 2020-06-25 ENCOUNTER — Other Ambulatory Visit (HOSPITAL_COMMUNITY): Payer: Self-pay | Admitting: *Deleted

## 2020-06-25 MED ORDER — EMPAGLIFLOZIN 10 MG PO TABS
10.0000 mg | ORAL_TABLET | Freq: Every day | ORAL | 3 refills | Status: DC
  Filled 2020-06-25: qty 30, 30d supply, fill #0
  Filled 2020-07-29: qty 30, 30d supply, fill #1
  Filled 2020-08-28: qty 30, 30d supply, fill #2
  Filled 2020-09-30: qty 30, 30d supply, fill #3

## 2020-06-25 NOTE — Telephone Encounter (Signed)
Advanced Heart Failure Patient Advocate Encounter  Patient called in concerned about the Jardiance co-pay. States that he usually gets the medication from [facility] outpatient. Sent Jasmine, ([place]) a request to send a new RX to [facility] with the note to use the HF Fund in the comments.  Charlann Boxer, CPhT

## 2020-07-07 ENCOUNTER — Other Ambulatory Visit (HOSPITAL_COMMUNITY): Payer: Self-pay

## 2020-07-07 MED ORDER — SACUBITRIL-VALSARTAN 97-103 MG PO TABS: 1.0000 | ORAL_TABLET | Freq: Two times a day (BID) | ORAL | 0 refills | Status: DC

## 2020-07-10 ENCOUNTER — Other Ambulatory Visit (HOSPITAL_COMMUNITY): Payer: Self-pay | Admitting: Cardiology

## 2020-07-11 IMAGING — CR DG CHEST 2V
2 series · 2 of 2 positions shown · non-contrast
Comparison: Radiographs November 30, 2017.

CLINICAL DATA: Shortness of breath.

EXAM:
CHEST - 2 VIEW

[w chest pa]
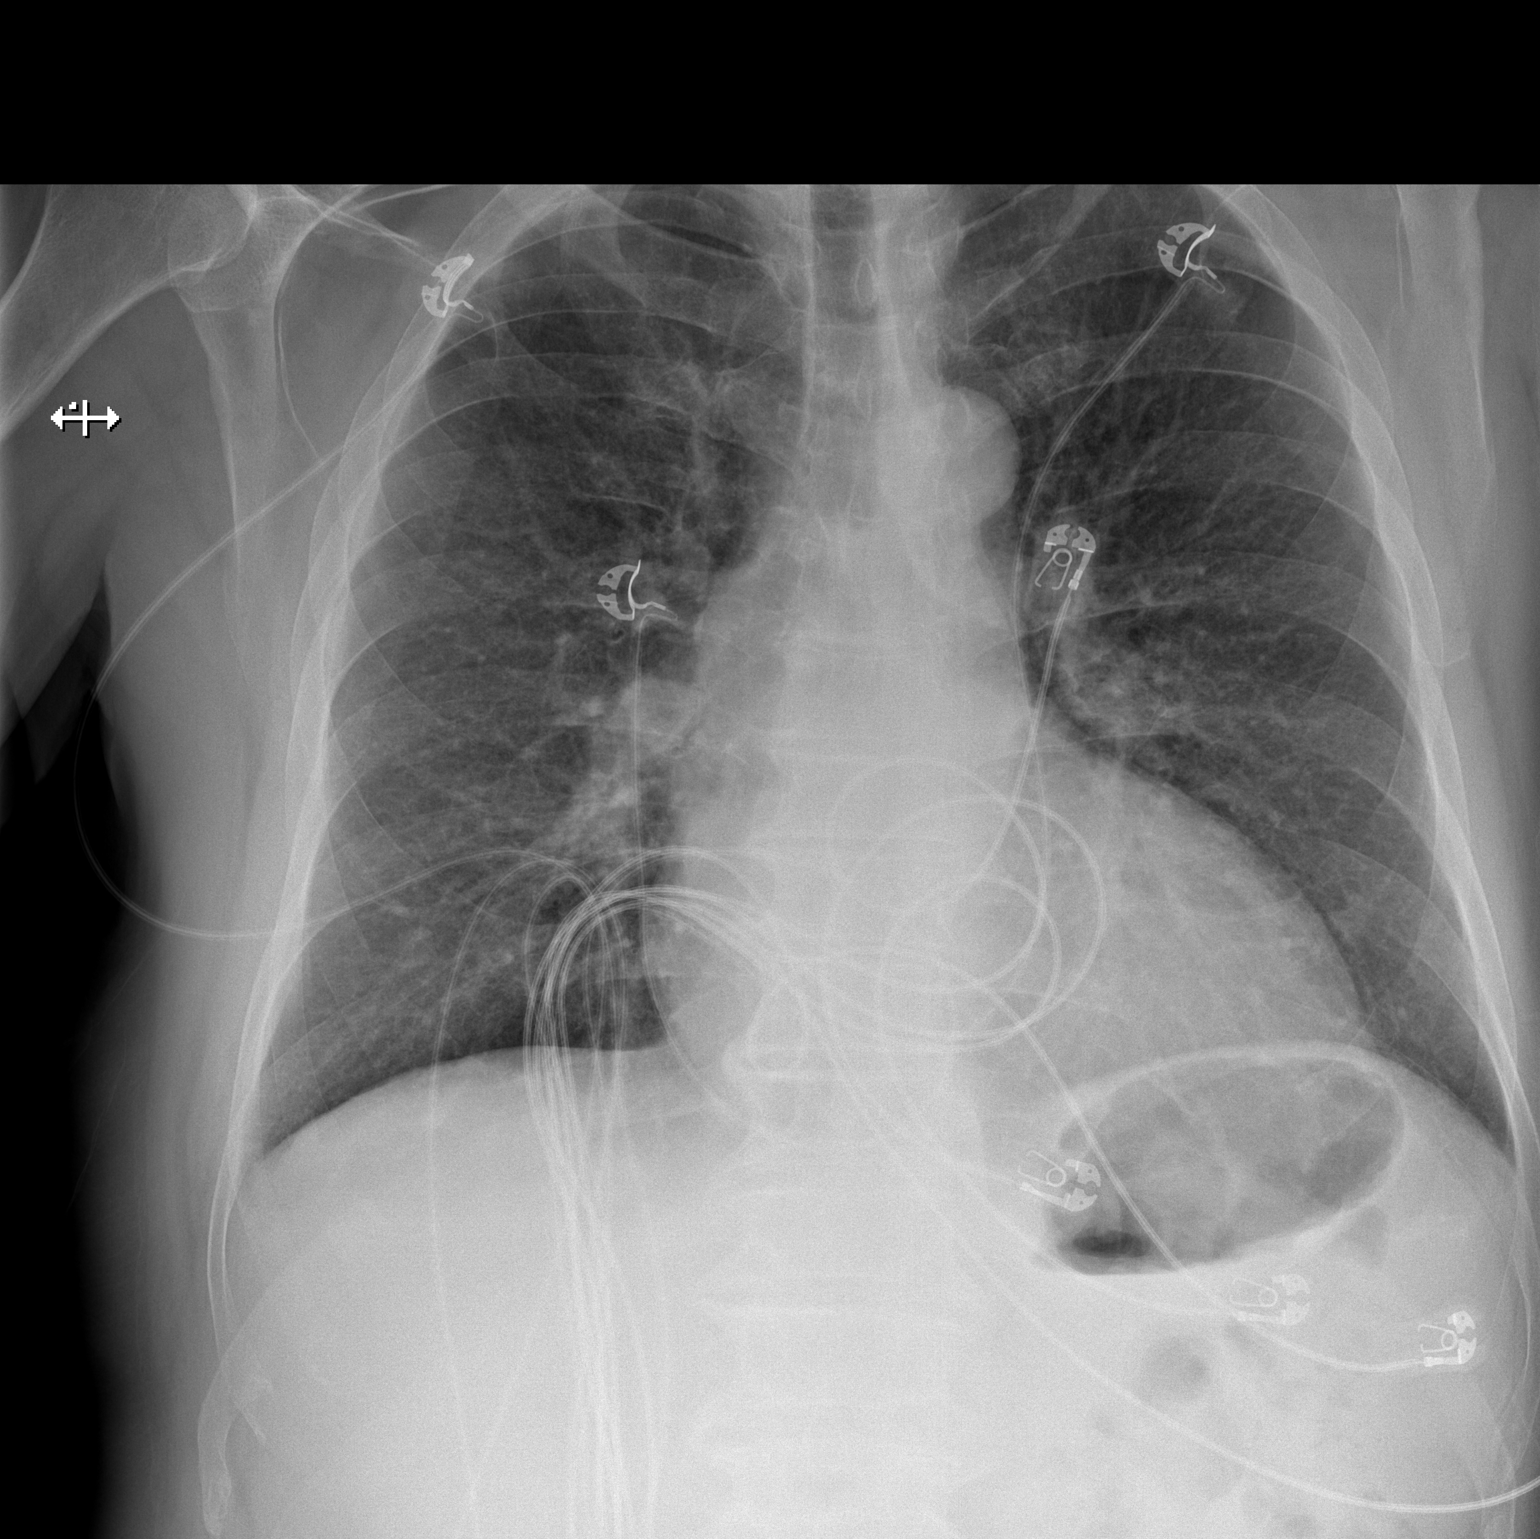

[w chest lat]
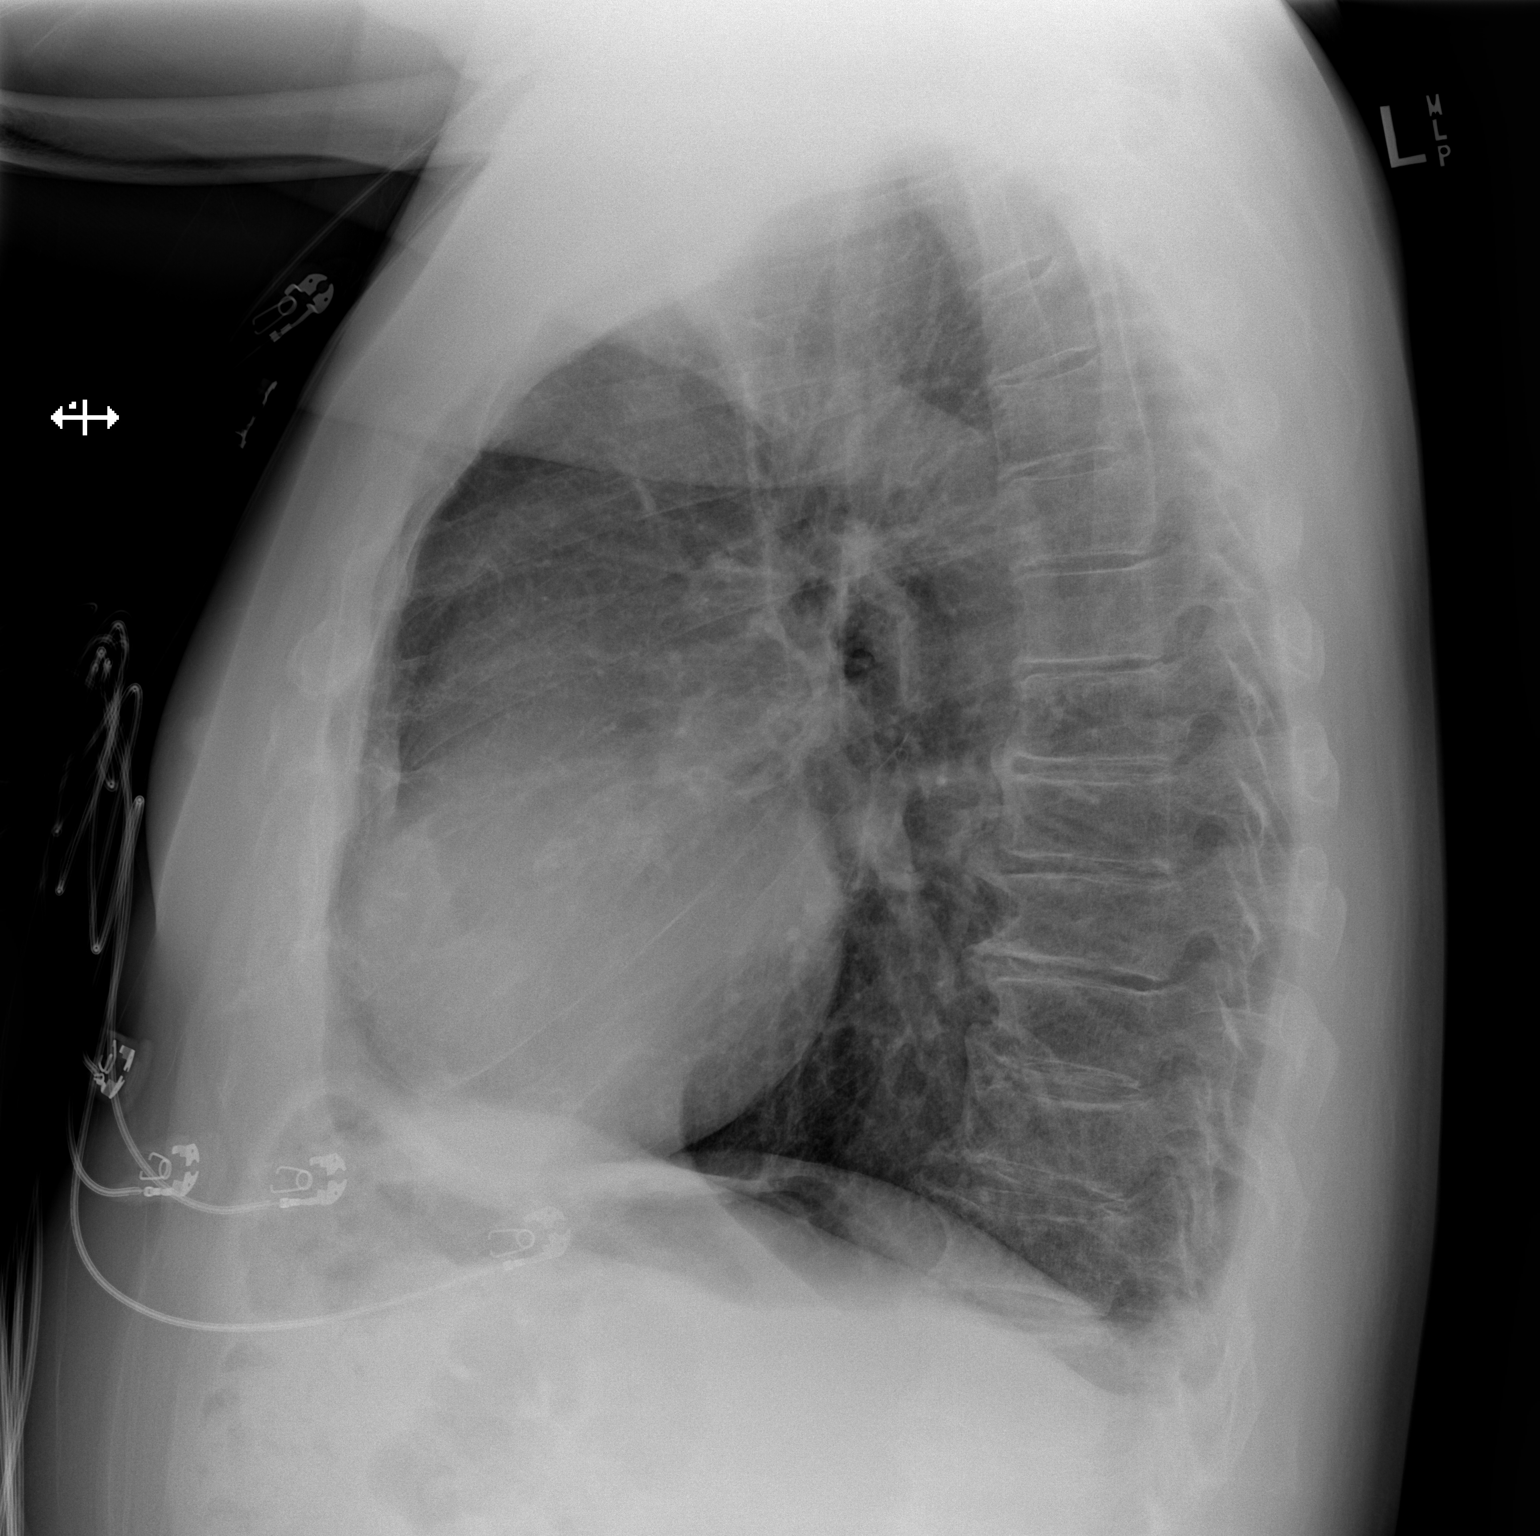

[2 of 2 positions shown; findings below may reference images not displayed]

FINDINGS: Stable cardiomediastinal silhouette. No pneumothorax or pleural
effusion is noted. Both lungs are clear. The visualized skeletal
structures are unremarkable.
IMPRESSION: No active cardiopulmonary disease.

## 2020-07-13 ENCOUNTER — Telehealth (HOSPITAL_COMMUNITY): Payer: Self-pay | Admitting: Pharmacy Technician

## 2020-07-13 ENCOUNTER — Other Ambulatory Visit (HOSPITAL_COMMUNITY): Payer: Self-pay | Admitting: *Deleted

## 2020-07-13 MED ORDER — SACUBITRIL-VALSARTAN 97-103 MG PO TABS
1.0000 | ORAL_TABLET | Freq: Two times a day (BID) | ORAL | 3 refills | Status: DC
Start: 2020-07-13 — End: 2020-10-08

## 2020-07-13 NOTE — Telephone Encounter (Signed)
Advanced Heart Failure Patient Advocate Encounter  I received a call from [employer] seeking clarification on an Entresto RX that was sent in. Confirmed that the patient should be getting 90 day supplies, not 45. Neville Route, (RN) a request to send an updated RX to [employer].  Spoke to patient regarding re-enrollment of Entresto assistance with [employer]. Will email the application for the patient to sign.   Will fax in once signatures are obtained.  Patient requested a refill of Protonix. Sent Jasmine ([place]) a request for that RX to be sent to [facility] on [address], per patient's request.

## 2020-07-20 ENCOUNTER — Other Ambulatory Visit (HOSPITAL_COMMUNITY): Payer: Self-pay | Admitting: Cardiology

## 2020-07-21 ENCOUNTER — Other Ambulatory Visit (HOSPITAL_COMMUNITY): Payer: Self-pay

## 2020-07-21 ENCOUNTER — Other Ambulatory Visit (HOSPITAL_COMMUNITY): Payer: Self-pay | Admitting: Cardiology

## 2020-07-22 ENCOUNTER — Other Ambulatory Visit (HOSPITAL_COMMUNITY): Payer: Self-pay | Admitting: *Deleted

## 2020-07-22 ENCOUNTER — Other Ambulatory Visit (HOSPITAL_COMMUNITY): Payer: Self-pay

## 2020-07-22 MED ORDER — METOPROLOL SUCCINATE ER 100 MG PO TB24: ORAL_TABLET | ORAL | 4 refills | Status: DC

## 2020-07-23 ENCOUNTER — Other Ambulatory Visit (HOSPITAL_COMMUNITY): Payer: Self-pay | Admitting: *Deleted

## 2020-07-23 ENCOUNTER — Other Ambulatory Visit (HOSPITAL_COMMUNITY): Payer: Self-pay

## 2020-07-23 MED ORDER — METOPROLOL SUCCINATE ER 100 MG PO TB24
100.0000 mg | ORAL_TABLET | Freq: Two times a day (BID) | ORAL | 4 refills | Status: DC
  Filled 2020-07-23: qty 60, 30d supply, fill #0
  Filled 2020-08-28: qty 60, 30d supply, fill #1
  Filled 2020-09-30: qty 60, 30d supply, fill #2
  Filled 2020-11-02: qty 60, 30d supply, fill #3
  Filled 2020-12-02: qty 60, 30d supply, fill #4

## 2020-07-29 ENCOUNTER — Other Ambulatory Visit (HOSPITAL_COMMUNITY): Payer: Self-pay

## 2020-08-13 NOTE — Telephone Encounter (Signed)
Advanced Heart Failure Patient Advocate Encounter  Submitted Patient Assistance Application to [employer] for  Entresto  along with provider portion. Will update patient when we receive a response.  Fax# [phone] Phone# [phone]  

## 2020-08-25 ENCOUNTER — Other Ambulatory Visit (HOSPITAL_COMMUNITY): Payer: Self-pay

## 2020-08-25 NOTE — Telephone Encounter (Signed)
Advanced Heart Failure Patient Advocate Encounter   Patient was approved to receive Entresto from [employer]  Patient ID: [id number] Effective dates: 08/14/20 through 08/26/21

## 2020-08-26 ENCOUNTER — Other Ambulatory Visit (HOSPITAL_COMMUNITY): Payer: Self-pay

## 2020-08-28 ENCOUNTER — Other Ambulatory Visit (HOSPITAL_COMMUNITY): Payer: Self-pay

## 2020-08-29 IMAGING — CR DG CHEST 2V
2 series · 2 of 2 positions shown · non-contrast
Comparison: 02/08/2018

CLINICAL DATA: Shortness of breath

EXAM:
CHEST - 2 VIEW

[chest pa]
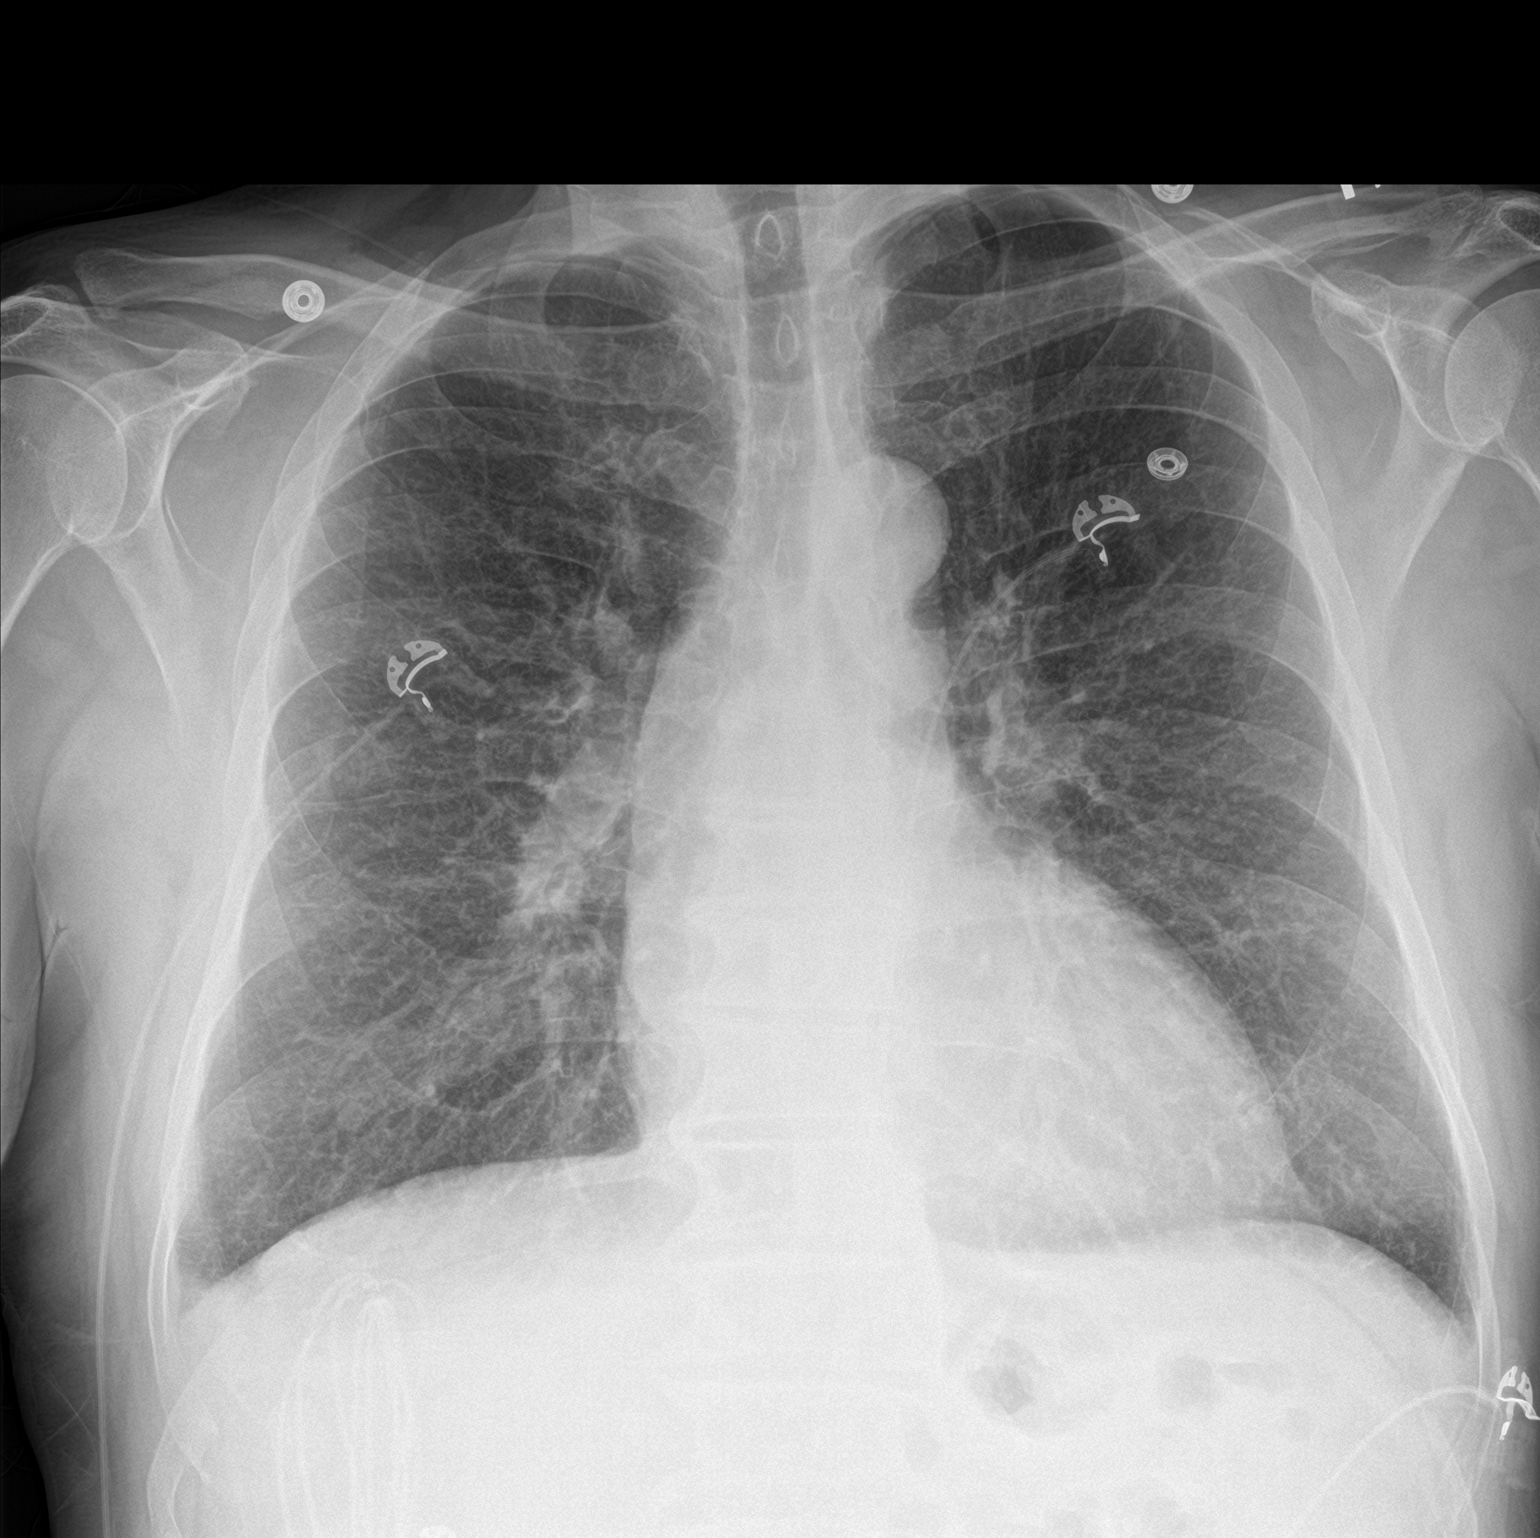

[chest lat]
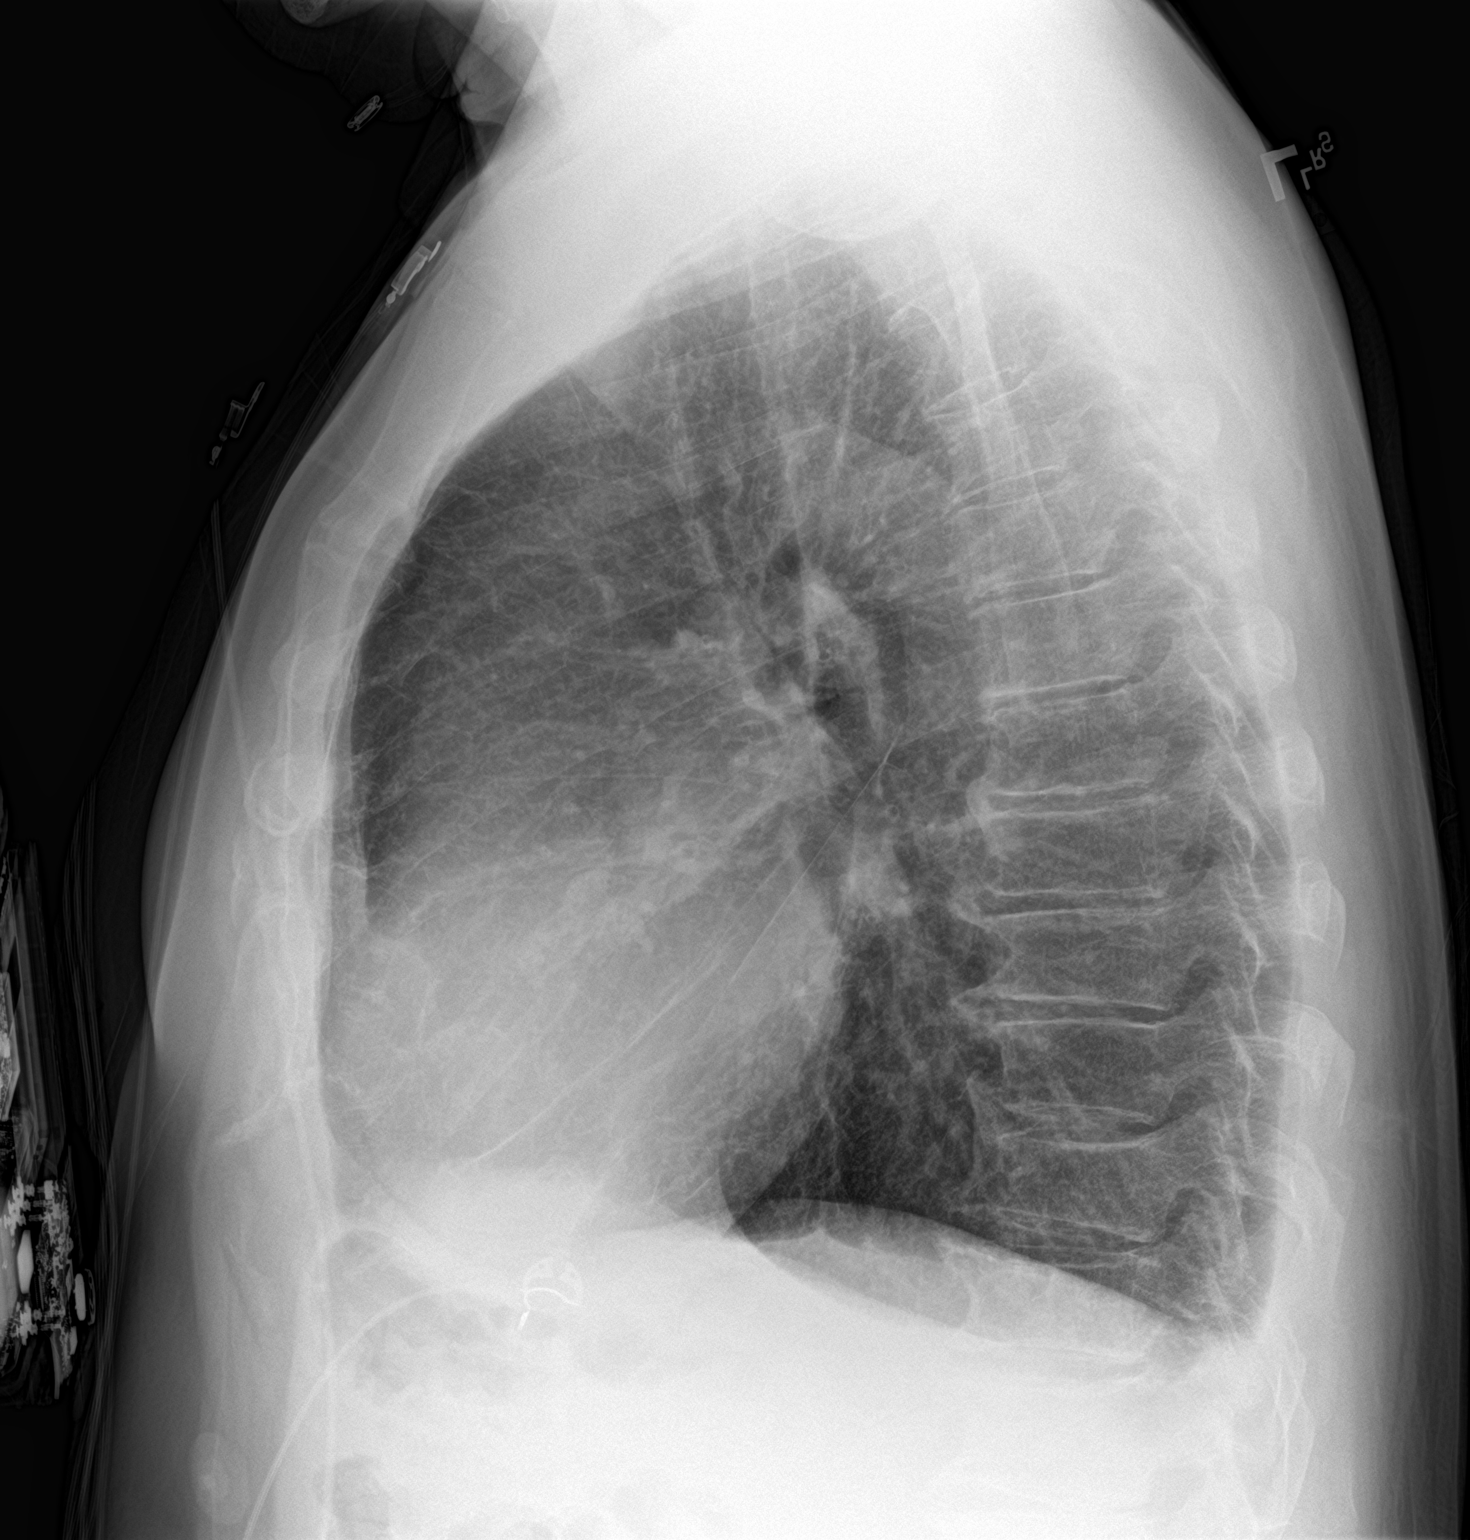

[2 of 2 positions shown; findings below may reference images not displayed]

FINDINGS: Cardiomegaly. Mild diffuse interstitial pulmonary opacity. Disc
degenerative disease of the thoracic spine.
IMPRESSION: Mild diffuse interstitial pulmonary opacity, likely edema in the
setting of cardiomegaly. There is no focal airspace opacity.

## 2020-09-15 ENCOUNTER — Other Ambulatory Visit (HOSPITAL_COMMUNITY): Payer: Self-pay | Admitting: Cardiology

## 2020-09-28 ENCOUNTER — Other Ambulatory Visit (HOSPITAL_COMMUNITY): Payer: Self-pay

## 2020-09-29 ENCOUNTER — Other Ambulatory Visit (HOSPITAL_COMMUNITY): Payer: Self-pay

## 2020-09-30 ENCOUNTER — Other Ambulatory Visit (HOSPITAL_COMMUNITY): Payer: Self-pay

## 2020-10-08 ENCOUNTER — Other Ambulatory Visit (HOSPITAL_COMMUNITY): Payer: Self-pay | Admitting: *Deleted

## 2020-10-08 MED ORDER — SACUBITRIL-VALSARTAN 97-103 MG PO TABS: 1.0000 | ORAL_TABLET | Freq: Two times a day (BID) | ORAL | 3 refills | Status: DC

## 2020-10-19 ENCOUNTER — Other Ambulatory Visit (HOSPITAL_COMMUNITY): Payer: Self-pay | Admitting: Cardiology

## 2020-10-19 DIAGNOSIS — E785 Hyperlipidemia, unspecified: Secondary | ICD-10-CM

## 2020-10-21 ENCOUNTER — Encounter: Payer: Self-pay | Admitting: Adult Health

## 2020-10-27 ENCOUNTER — Other Ambulatory Visit (HOSPITAL_COMMUNITY): Payer: Self-pay

## 2020-10-27 ENCOUNTER — Other Ambulatory Visit (HOSPITAL_COMMUNITY): Payer: Self-pay | Admitting: Cardiology

## 2020-10-27 MED ORDER — MEXILETINE HCL 250 MG PO CAPS
250.0000 mg | ORAL_CAPSULE | Freq: Two times a day (BID) | ORAL | 2 refills | Status: DC
  Filled 2020-10-27: qty 60, 30d supply, fill #0
  Filled 2020-11-26: qty 60, 30d supply, fill #1
  Filled 2020-12-28: qty 60, 30d supply, fill #2

## 2020-11-02 ENCOUNTER — Other Ambulatory Visit (HOSPITAL_COMMUNITY): Payer: Self-pay | Admitting: Cardiology

## 2020-11-02 ENCOUNTER — Other Ambulatory Visit (HOSPITAL_COMMUNITY): Payer: Self-pay

## 2020-11-02 MED ORDER — EMPAGLIFLOZIN 10 MG PO TABS
10.0000 mg | ORAL_TABLET | Freq: Every day | ORAL | 3 refills | Status: DC
  Filled 2020-11-02: qty 30, 30d supply, fill #0
  Filled 2020-12-02: qty 30, 30d supply, fill #1
  Filled 2020-12-28: qty 30, 30d supply, fill #2
  Filled 2021-02-01: qty 30, 30d supply, fill #3

## 2020-11-03 ENCOUNTER — Other Ambulatory Visit (HOSPITAL_COMMUNITY): Payer: Self-pay

## 2020-11-05 ENCOUNTER — Encounter: Payer: Self-pay | Admitting: Adult Health

## 2020-11-10 ENCOUNTER — Encounter: Payer: Self-pay | Admitting: Adult Health

## 2020-11-27 ENCOUNTER — Other Ambulatory Visit (HOSPITAL_COMMUNITY): Payer: Self-pay

## 2020-11-27 ENCOUNTER — Encounter: Payer: Medicare Other | Admitting: Adult Health

## 2020-11-30 ENCOUNTER — Other Ambulatory Visit (HOSPITAL_COMMUNITY): Payer: Self-pay

## 2020-12-02 ENCOUNTER — Other Ambulatory Visit (HOSPITAL_COMMUNITY): Payer: Self-pay

## 2020-12-24 NOTE — Progress Notes (Signed)
Patient ID: Tyler Berger, male   DOB: 1960/12/24, 60 y.o.   MRN: [id number]  COMPLETE PHYSICAL  Assessment and Plan:   Encounter for general adult medical examination with abnormal findings Patient does not have insurance- will do minimal labs as verified with him Order placed for Colonoscopy for Colon Cancer SCREENING  Essential hypertension Continue medications Monitor blood pressure at home; call if consistently over 130/80 Continue DASH diet.   Reminder to go to the ER if any CP, SOB, nausea, dizziness, severe HA, changes vision/speech, left arm numbness and tingling and jaw pain. CBC CMP Microalbumin/creatinine urine ratio  Chronic combined systolic and diastolic congestive heart failure [facility]) May try to maximize medications/blood pressure LONG discussion about sodium/sugar and maximizing medications to help his heart not work as hard.  Goal weight is likely closer to 235-240  Weight daily and bring log of weight and BP to heart failure clinic He has not been to the heart failure clinic for some time, referral is placed since he now has insurance  Non-ischemic cardiomyopathy ([place]) Try to maximize medications/blood pressure LONG discussion about sodium/sugar and maximizing medications to help his heart not work as hard.   Goal weight is likely closer to 235-240 WEIGH DAILY and bring log of weight and BP to heart failure clinic Strongly encouraged use of compression socks to help with edema  SVT (supraventricular tachycardia) (HCC) Remains controlled with meds; cardiology following   PVC (premature ventricular contraction) NSR at this time, recent Zio with burden down to 3.4% Off of amiodarone; cardiology following  S/P aortic valve replacement with bioprosthetic valve + resection ascending thoracic aortic aneurysm Continue cardiology follow up  Hyperlipidemia, unspecified hyperlipidemia type Continue rosuvastatin; Has not seen Heart failure clinic in some time decrease  fatty foods increase activity.  Lipid panel  Abnormal glucose (prediabetes) Discussed disease and risks; on jardiance for heart/sugars  Discussed diet/exercise, weight management  Given glucometer sample and strips, can check PRN fasting  If A1C 7+ plan to start metformin -     Hemoglobin A1c  Anxiety Continue zoloft, buspar and xanax AS needed Declines dose change today;  Financial stress significant - pending disability  Stress management techniques discussed, increase water, good sleep hygiene discussed, increase exercise, and increase veggies.   Vitamin D deficiency Continue supplement   Former smoker Quit smoking, congratulated the patient and encouraged him to continue.  30 pack year; once he has insurance will discuss low dose CT; denies concerning sx today   Prostate cancer screening PSA   Continue diet and meds as discussed. Further disposition pending results of labs. Future Appointments  Date Time Provider [place]  12/28/2021  3:00 PM Magda Bernheim, NP [facility] None    HPI 60 y.o. male  presents for CPE. He has Hyperlipidemia; Hypertension; Anxiety; Vitamin D deficiency; Medication management; Varicose veins of both lower extremities; LBBB (left bundle branch block); SVT (supraventricular tachycardia) ([place]); Former smoker (30 pack year history, quit 03/2018); Non-ischemic cardiomyopathy ([place]); Chronic combined systolic and diastolic congestive heart failure (HCC); S/P aortic valve replacement with bioprosthetic valve + resection ascending thoracic aortic aneurysm; S/P ascending aortic aneurysm repair; PVC (premature ventricular contraction); Coronary artery disease involving native coronary artery of native heart without angina pectoris; Obesity (BMI 30.0-34.9); and Other abnormal glucose (prediabetes) on their problem list.  He is divorced from wife, has male partner, not frequently active, declines STD testing.  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  Currently uninsured and requests selective labs.   Patient is s/p aortic valve replacement with biosynthetic valve and thoracic aortic aneurysm repair on 04/10/2018 by Dr. Roxy Manns, has systolic heart failure with post op TEE showing EF 35-40%.  He was placed on amiodarone due to PVCs with improvement, then transitioned to mexiletine, toprol, entresto. Following with Dr. Aundra Dubin at the heart failure clinic. Echo 10/2018 showed EF up to 40-45%, normally functioning valve, NYHA class II. Mod nonobstructive CAD per 03/2018 cath.  Zio patch in 4/21 showed PVC burden down to 3.4%.  He has been stable generally, though with ongoing limitations with activity and fatigue.  He recently acquired health insurance.  Has not been seen by Heart failure clinic in over a year. Continues on medications  he has never had a colonoscopy due to uninsured/cost, on review had AB CTA 03/2018 that was negative for cancer/masses at that time, also normal appearing prostate.   He has depression/anxiety, feels like he is doing fairly well He is on zoloft 100 mg, buspar 10 mg TID which has helped, xanax 0.25 mg most evenings with good results. Formerly on wellbutrin, was stopped due to ? Seizure at [employer]. Feels fairly controlled, just ready for financial stress to be resolved, was living in car for a while.   BMI is Body mass index is 33.7 kg/m., he is working on diet and exercise., trying to eat healthy, oatmeal, lean proteins, fruits/veggies, watches sodium closely  Was formerly very active, went to GYM daily, slowly trying to increase now walking around the block in his neighborhood 2-3 days a week 4-5 bottles a day of water  1 glass wine/week  Wt Readings from Last 3 Encounters:  12/28/20 250 lb 3.2 oz (113.5 kg)  05/12/20 232 lb (105.2 kg)  12/04/19 261 lb (118.4 kg)   His blood pressure has been controlled at  home, today their BP is BP: 120/72  He does workout, walking 3 days a week. He denies chest pain, shortness of breath, dizziness, edema, PND. Has intermittent fatigue, "just don't feel good."   He is on cholesterol medication, crestor 40 mg daily and denies myalgias. His cholesterol is not at goal less than 70. The cholesterol last visit was:   Lab Results  Component Value Date   CHOL 133 02/06/2020   HDL 47 02/06/2020   LDLCALC 56 02/06/2020   TRIG 152 (H) 02/06/2020   CHOLHDL 2.8 02/06/2020    He has been working on diet and exercise for prediabetes, and denies increased appetite, nausea, paresthesia of the feet, polydipsia, polyuria, visual disturbances, vomiting and weight loss.  On Jardiance via cardiology for heart -  Last A1C in the office was:  Lab Results  Component Value Date   HGBA1C 5.8 (H) 11/11/2019    Patient is on Vitamin D supplement.   Lab Results  Component Value Date   VD25OH 40 04/22/2013     No results found for: PSA1, PSA  Denies obstructive urinary sx; straining, nocturia, dribbing, frequency   Current Medications:   Current Outpatient Medications (Endocrine & Metabolic):    empagliflozin (JARDIANCE) 10 MG TABS tablet, Take 1 tablet (10 mg total) by mouth daily.  Current Outpatient Medications (Cardiovascular):    eplerenone (INSPRA) 50 MG tablet, Take 1 tablet by mouth once daily   furosemide (LASIX) 20 MG tablet, Take 1 tablet (20 mg total) by mouth daily.   metoprolol succinate (TOPROL-XL) 100 MG 24  hr tablet, Take 1 tablet (100 mg total) by mouth 2 (two) times daily in the morning and at bedtime. Take with or immediately following a meal.   mexiletine (MEXITIL) 250 MG capsule, Take 1 capsule (250 mg total) by mouth 2 (two) times daily.   rosuvastatin (CRESTOR) 40 MG tablet, Take 1 tablet by mouth once daily   sacubitril-valsartan (ENTRESTO) 97-103 MG, Take 1 tablet by mouth 2 (two) times daily.   ezetimibe (ZETIA) 10 MG tablet, Take 1 tablet (10 mg  total) by mouth daily.   Current Outpatient Medications (Analgesics):    acetaminophen (TYLENOL) 500 MG tablet, Take 1,000 mg by mouth every 6 (six) hours as needed for mild pain or headache.   aspirin EC 81 MG tablet, Take 81 mg by mouth daily. Swallow whole.   Current Outpatient Medications (Other):    ALPRAZolam (XANAX) 0.5 MG tablet, TAKE 1/2-1 TABLET BY MOUTH AT BEDTIME ONLY IF NEEDED FOR SLEEP AND LIMIT TO 5 DAYS A WEEK TO AVOID ADDICTION AND DEMENTIA   busPIRone (BUSPAR) 10 MG tablet, TAKE 1 TABLET BY MOUTH THREE TIMES DAILY FOR CHRONIC ANXIETY AND MOOD   Cholecalciferol (VITAMIN D3) 125 MCG (5000 UT) TABS, Take 5,000 Units by mouth daily.    pantoprazole (PROTONIX) 40 MG tablet, Take 1 tablet by mouth once daily   potassium chloride (KLOR-CON) 10 MEQ tablet, Take 1 tablet by mouth once daily   sertraline (ZOLOFT) 100 MG tablet, Take 1 tablet (100 mg total) by mouth daily.  Medical History:  Past Medical History:  Diagnosis Date   Anxiety    Aortic root aneurysm (HCC)    Aortic stenosis    Ascending aortic aneurysm (HCC)    CAD (coronary artery disease)    Chronic combined systolic and diastolic congestive heart failure (HCC)    Dilated aortic root (HCC)    Hypertension    Non-ischemic cardiomyopathy (HCC)    Nonischemic cardiomyopathy (HCC)    S/P aortic valve replacement with bioprosthetic valve 04/10/2018   29 mm Edwards Inspiris Resilia stented bovine pericardial tissue valve   S/P ascending aortic aneurysm repair 04/10/2018   26 mm Hemashield Platinum supracoronary straight graft   SVT (supraventricular tachycardia) (HCC)    Viral myocarditis 2004   Vitamin D deficiency    Allergies No Known Allergies  Immunization History  Administered Date(s) Administered   Influenza Inj Mdck Quad Pf 12/05/2016   Influenza Inj Mdck Quad With Preservative 11/11/2019   Influenza,inj,Quad PF,6+ Mos 11/10/2017   Influenza-Unspecified 12/05/2016, 11/28/2018    Pneumococcal-Unspecified 02/08/1992   Td 02/07/1997   Tdap 06/01/2017   Colonoscopy: never, defer due to no insurance, consider cologuard  Echo 10/2018 Cath 03/2018  CXR 04/2018 CTA chest 03/2018  Last tetanus: 2019 Last influenza: 12/28/20 Covid 19: 2/2, 2021, pfizer   Last eye: has readers, no other concerns Last dental: no concerns, last 2020  SURGICAL HISTORY He  has a past surgical history that includes Hernia repair (Left, 2010); Orchiectomy (Left, 1982); Varicose vein surgery (Left, 05/1998); RIGHT HEART CATH AND CORONARY ANGIOGRAPHY (N/A, 03/28/2018); Multiple extractions with alveoloplasty (N/A, 04/05/2018); Ascending aortic root replacement (N/A, 04/10/2018); TEE without cardioversion (N/A, 04/10/2018); and Aortic valve replacement (N/A, 04/10/2018). FAMILY HISTORY His family history includes Alzheimer's disease in his mother; Breast cancer (age of onset: 61) in his mother; Cancer in his father; Diabetes in his daughter, mother, and son; Hypertension in his father, mother, and sister. SOCIAL HISTORY He  reports that he quit smoking about 2 years ago. His smoking  use included cigarettes. He started smoking about 42 years ago. He has a 30.00 pack-year smoking history. He has never used smokeless tobacco. He reports current alcohol use of about 1.0 standard drink per week. He reports that he does not use drugs.   Review of Systems  Constitutional:  Negative for chills, fever, malaise/fatigue and weight loss.  HENT:  Negative for congestion, hearing loss, sinus pain, sore throat and tinnitus.   Eyes:  Negative for blurred vision and double vision.  Respiratory:  Negative for cough, hemoptysis, sputum production, shortness of breath and wheezing.   Cardiovascular:  Positive for leg swelling. Negative for chest pain, palpitations, orthopnea and claudication.  Gastrointestinal:  Negative for abdominal pain, blood in stool, constipation, diarrhea, heartburn, melena, nausea and vomiting.   Genitourinary: Negative.  Negative for dysuria and urgency.  Musculoskeletal:  Negative for back pain, falls, joint pain, myalgias and neck pain.  Skin:  Negative for rash.  Neurological:  Negative for dizziness, tingling, tremors, sensory change, weakness and headaches.  Endo/Heme/Allergies:  Negative for polydipsia. Does not bruise/bleed easily.  Psychiatric/Behavioral:  Negative for depression, substance abuse and suicidal ideas. The patient is not nervous/anxious and does not have insomnia.   All other systems reviewed and are negative.  Physical Exam: BP 120/72   Pulse 84   Temp (!) 97.3 F (36.3 C)   Ht 6' 0.25" (1.835 m)   Wt 250 lb 3.2 oz (113.5 kg)   SpO2 97%   BMI 33.70 kg/m  Wt Readings from Last 3 Encounters:  12/28/20 250 lb 3.2 oz (113.5 kg)  05/12/20 232 lb (105.2 kg)  12/04/19 261 lb (118.4 kg)  Body mass index is 33.7 kg/m.  General Appearance: Well nourished, well dressed male in no acute distress. Eyes: PERRLA, EOMs, conjunctiva no swelling or erythema Sinuses: No Frontal/maxillary tenderness ENT/Mouth: Ext aud canals clear, TMs without erythema, bulging. No erythema, swelling, or exudate on post pharynx.  Tonsils not swollen or erythematous. Hearing normal.  Neck: Supple, thyroid normal.  Respiratory: Respiratory effort normal, BS equal bilaterally without rales, rhonchi, wheezing or stridor.  Cardio: Regular rate and rhythm, no murmurs, rubs or gallops.  Brisk peripheral pulses with 1+ pitting edema of ankles and feet bilaterally;  large torturous varicose veins bilateral legs, non-tender.  Abdomen: Soft, + BS.  Non tender, no guarding, rebound, hernias, masses. Lymphatics: Non tender without lymphadenopathy.  Musculoskeletal: Full ROM, 5/5 strength, normal gait.  Skin: Warm, dry without rashes, lesions, ecchymosis.  Neuro: Cranial nerves intact. Normal muscle tone, no cerebellar symptoms.  Psych: Awake and oriented X 3, anxious affect, Insight and Judgment  appropriate.  GU: reports regular self exams, DECLINES today   EKG: LBBB, AV Block I  Juel Ripley W Briena Swingler 3:37 PM

## 2020-12-28 ENCOUNTER — Encounter: Payer: Self-pay | Admitting: Nurse Practitioner

## 2020-12-28 ENCOUNTER — Other Ambulatory Visit (HOSPITAL_COMMUNITY): Payer: Self-pay

## 2020-12-28 ENCOUNTER — Ambulatory Visit (INDEPENDENT_AMBULATORY_CARE_PROVIDER_SITE_OTHER): Payer: Medicare Other | Admitting: Nurse Practitioner

## 2020-12-28 ENCOUNTER — Other Ambulatory Visit: Payer: Self-pay

## 2020-12-28 VITALS — BP 120/72 | HR 84 | Temp 97.3°F | Ht 72.25 in | Wt 250.2 lb

## 2020-12-28 DIAGNOSIS — Z125 Encounter for screening for malignant neoplasm of prostate: Secondary | ICD-10-CM

## 2020-12-28 DIAGNOSIS — I10 Essential (primary) hypertension: Secondary | ICD-10-CM | POA: Diagnosis not present

## 2020-12-28 DIAGNOSIS — Z953 Presence of xenogenic heart valve: Secondary | ICD-10-CM

## 2020-12-28 DIAGNOSIS — Z87891 Personal history of nicotine dependence: Secondary | ICD-10-CM

## 2020-12-28 DIAGNOSIS — Z0000 Encounter for general adult medical examination without abnormal findings: Secondary | ICD-10-CM | POA: Diagnosis not present

## 2020-12-28 DIAGNOSIS — E559 Vitamin D deficiency, unspecified: Secondary | ICD-10-CM

## 2020-12-28 DIAGNOSIS — Z136 Encounter for screening for cardiovascular disorders: Secondary | ICD-10-CM

## 2020-12-28 DIAGNOSIS — Z1211 Encounter for screening for malignant neoplasm of colon: Secondary | ICD-10-CM

## 2020-12-28 DIAGNOSIS — I5042 Chronic combined systolic (congestive) and diastolic (congestive) heart failure: Secondary | ICD-10-CM

## 2020-12-28 DIAGNOSIS — E785 Hyperlipidemia, unspecified: Secondary | ICD-10-CM | POA: Diagnosis not present

## 2020-12-28 DIAGNOSIS — R7309 Other abnormal glucose: Secondary | ICD-10-CM | POA: Diagnosis not present

## 2020-12-28 DIAGNOSIS — I77810 Thoracic aortic ectasia: Secondary | ICD-10-CM

## 2020-12-28 DIAGNOSIS — Z23 Encounter for immunization: Secondary | ICD-10-CM | POA: Diagnosis not present

## 2020-12-28 DIAGNOSIS — Z0001 Encounter for general adult medical examination with abnormal findings: Secondary | ICD-10-CM

## 2020-12-28 DIAGNOSIS — I471 Supraventricular tachycardia: Secondary | ICD-10-CM

## 2020-12-28 DIAGNOSIS — I428 Other cardiomyopathies: Secondary | ICD-10-CM

## 2020-12-28 DIAGNOSIS — F419 Anxiety disorder, unspecified: Secondary | ICD-10-CM

## 2020-12-28 NOTE — Patient Instructions (Signed)
Edema °Edema is an abnormal buildup of fluids in the body tissues and under the skin. Swelling of the legs, feet, and ankles is a common symptom that becomes more likely as you get older. Swelling is also common in looser tissues, like around the eyes. When the affected area is squeezed, the fluid may move out of that spot and leave a dent for a few moments. This dent is called pitting edema. °There are many possible causes of edema. Eating too much salt (sodium) and being on your feet or sitting for a long time can cause edema in your legs, feet, and ankles. Hot weather may make edema worse. Common causes of edema include: °Heart failure. °Liver or kidney disease. °Weak leg blood vessels. °Cancer. °An injury. °Pregnancy. °Medicines. °Being obese. °Low protein levels in the blood. °Edema is usually painless. Your skin may look swollen or shiny. °Follow these instructions at home: °Keep the affected body part raised (elevated) above the level of your heart when you are sitting or lying down. °Do not sit still or stand for long periods of time. °Do not wear tight clothing. Do not wear garters on your upper legs. °Exercise your legs to get your circulation going. This helps to move the fluid back into your blood vessels, and it may help the swelling go down. °Wear elastic bandages or support stockings to reduce swelling as told by your health care provider. °Eat a low-salt (low-sodium) diet to reduce fluid as told by your health care provider. °Depending on the cause of your swelling, you may need to limit how much fluid you drink (fluid restriction). °Take over-the-counter and prescription medicines only as told by your health care provider. °Contact a health care provider if: °Your edema does not get better with treatment. °You have heart, liver, or kidney disease and have symptoms of edema. °You have sudden and unexplained weight gain. °Get help right away if: °You develop shortness of breath or chest pain. °You  cannot breathe when you lie down. °You develop pain, redness, or warmth in the swollen areas. °You have heart, liver, or kidney disease and suddenly get edema. °You have a fever and your symptoms suddenly get worse. °Summary °Edema is an abnormal buildup of fluids in the body tissues and under the skin. °Eating too much salt (sodium) and being on your feet or sitting for a long time can cause edema in your legs, feet, and ankles. °Keep the affected body part raised (elevated) above the level of your heart when you are sitting or lying down. °This information is not intended to replace advice given to you by your health care provider. Make sure you discuss any questions you have with your health care provider. °Document Revised: 06/25/2020 Document Reviewed: 11/19/2019 °Elsevier Patient Education © 2022 [employer] ° °

## 2020-12-29 ENCOUNTER — Other Ambulatory Visit (HOSPITAL_COMMUNITY): Payer: Self-pay

## 2020-12-29 ENCOUNTER — Encounter: Payer: Self-pay | Admitting: Nurse Practitioner

## 2020-12-29 LAB — CBC WITH DIFFERENTIAL/PLATELET
Absolute Monocytes: 776 cells/uL (ref 200–950)
Basophils Absolute: 72 cells/uL (ref 0–200)
Basophils Relative: 0.9 %
Eosinophils Absolute: 152 cells/uL (ref 15–500)
Eosinophils Relative: 1.9 %
HCT: 54.9 % — ABNORMAL HIGH (ref 38.5–50.0)
Hemoglobin: 18.8 g/dL — ABNORMAL HIGH (ref 13.2–17.1)
Lymphs Abs: 1992 cells/uL (ref 850–3900)
MCH: 31.6 pg (ref 27.0–33.0)
MCHC: 34.2 g/dL (ref 32.0–36.0)
MCV: 92.3 fL (ref 80.0–100.0)
MPV: 10.8 fL (ref 7.5–12.5)
Monocytes Relative: 9.7 %
Neutro Abs: 5008 cells/uL (ref 1500–7800)
Neutrophils Relative %: 62.6 %
Platelets: 126 10*3/uL — ABNORMAL LOW (ref 140–400)
RBC: 5.95 10*6/uL — ABNORMAL HIGH (ref 4.20–5.80)
RDW: 12.4 % (ref 11.0–15.0)
Total Lymphocyte: 24.9 %
WBC: 8 10*3/uL (ref 3.8–10.8)

## 2020-12-29 LAB — LIPID PANEL
Cholesterol: 143 mg/dL (ref <200)
HDL: 55 mg/dL (ref >=40)
LDL Cholesterol (Calc): 64 mg/dL (calc)
Non-HDL Cholesterol (Calc): 88 mg/dL (calc) (ref <130)
Total CHOL/HDL Ratio: 2.6 (calc) (ref <5.0)
Triglycerides: 165 mg/dL — ABNORMAL HIGH (ref <150)

## 2020-12-29 LAB — COMPLETE METABOLIC PANEL WITH GFR
AG Ratio: 1.6 (calc) (ref 1.0–2.5)
ALT: 29 U/L (ref 9–46)
AST: 24 U/L (ref 10–35)
Albumin: 4.6 g/dL (ref 3.6–5.1)
Alkaline phosphatase (APISO): 53 U/L (ref 35–144)
BUN: 18 mg/dL (ref 7–25)
CO2: 27 mmol/L (ref 20–32)
Calcium: 9.7 mg/dL (ref 8.6–10.3)
Chloride: 107 mmol/L (ref 98–110)
Creat: 0.95 mg/dL (ref 0.70–1.35)
Globulin: 2.8 g/dL (calc) (ref 1.9–3.7)
Glucose, Bld: 101 mg/dL — ABNORMAL HIGH (ref 65–99)
Potassium: 4.3 mmol/L (ref 3.5–5.3)
Sodium: 142 mmol/L (ref 135–146)
Total Bilirubin: 0.5 mg/dL (ref 0.2–1.2)
Total Protein: 7.4 g/dL (ref 6.1–8.1)
eGFR: 91 mL/min/{1.73_m2} (ref >=60)

## 2020-12-29 LAB — MICROALBUMIN / CREATININE URINE RATIO
Creatinine, Urine: 55 mg/dL (ref 20–320)
Microalb Creat Ratio: 33 mcg/mg creat — ABNORMAL HIGH (ref <30)
Microalb, Ur: 1.8 mg/dL

## 2020-12-29 LAB — PSA: PSA: 0.53 ng/mL (ref <=4.00)

## 2020-12-29 LAB — HEMOGLOBIN A1C
Hgb A1c MFr Bld: 5.9 % of total Hgb — ABNORMAL HIGH (ref <5.7)
Mean Plasma Glucose: 123 mg/dL
eAG (mmol/L): 6.8 mmol/L

## 2021-01-05 ENCOUNTER — Other Ambulatory Visit (HOSPITAL_COMMUNITY): Payer: Self-pay

## 2021-01-05 ENCOUNTER — Other Ambulatory Visit (HOSPITAL_COMMUNITY): Payer: Self-pay | Admitting: Cardiology

## 2021-01-05 MED ORDER — METOPROLOL SUCCINATE ER 100 MG PO TB24
100.0000 mg | ORAL_TABLET | Freq: Two times a day (BID) | ORAL | 4 refills | Status: DC
  Filled 2021-01-05 – 2021-01-18 (×2): qty 60, 30d supply, fill #0
  Filled 2021-02-22: qty 60, 30d supply, fill #1
  Filled 2021-03-28: qty 60, 30d supply, fill #2
  Filled 2021-04-27: qty 60, 30d supply, fill #3
  Filled 2021-05-28: qty 60, 30d supply, fill #4

## 2021-01-13 ENCOUNTER — Other Ambulatory Visit (HOSPITAL_COMMUNITY): Payer: Self-pay

## 2021-01-14 ENCOUNTER — Ambulatory Visit (HOSPITAL_COMMUNITY)
Admission: RE | Admit: 2021-01-14 | Discharge: 2021-01-14 | Disposition: A | Discharge status: Home / Self Care | Payer: Self-pay | Source: Ambulatory Visit | Attending: Adult Health | Admitting: Adult Health

## 2021-01-14 ENCOUNTER — Ambulatory Visit (HOSPITAL_COMMUNITY)
Admission: RE | Admit: 2021-01-14 | Discharge: 2021-01-14 | Disposition: A | Discharge status: Home / Self Care | Payer: Medicare Other | Source: Ambulatory Visit | Attending: Family Medicine | Admitting: Family Medicine

## 2021-01-14 ENCOUNTER — Encounter (HOSPITAL_COMMUNITY): Payer: Self-pay

## 2021-01-14 ENCOUNTER — Other Ambulatory Visit: Payer: Self-pay

## 2021-01-14 VITALS — BP 128/88 | HR 76 | Wt 253.0 lb

## 2021-01-14 DIAGNOSIS — Z953 Presence of xenogenic heart valve: Secondary | ICD-10-CM | POA: Insufficient documentation

## 2021-01-14 DIAGNOSIS — I2510 Atherosclerotic heart disease of native coronary artery without angina pectoris: Secondary | ICD-10-CM | POA: Insufficient documentation

## 2021-01-14 DIAGNOSIS — I110 Hypertensive heart disease with heart failure: Secondary | ICD-10-CM | POA: Diagnosis not present

## 2021-01-14 DIAGNOSIS — Z7984 Long term (current) use of oral hypoglycemic drugs: Secondary | ICD-10-CM | POA: Insufficient documentation

## 2021-01-14 DIAGNOSIS — Z79899 Other long term (current) drug therapy: Secondary | ICD-10-CM | POA: Insufficient documentation

## 2021-01-14 DIAGNOSIS — I5022 Chronic systolic (congestive) heart failure: Secondary | ICD-10-CM | POA: Insufficient documentation

## 2021-01-14 DIAGNOSIS — I2721 Secondary pulmonary arterial hypertension: Secondary | ICD-10-CM | POA: Insufficient documentation

## 2021-01-14 DIAGNOSIS — M7989 Other specified soft tissue disorders: Secondary | ICD-10-CM | POA: Diagnosis not present

## 2021-01-14 DIAGNOSIS — I447 Left bundle-branch block, unspecified: Secondary | ICD-10-CM | POA: Diagnosis not present

## 2021-01-14 DIAGNOSIS — E669 Obesity, unspecified: Secondary | ICD-10-CM

## 2021-01-14 DIAGNOSIS — R0683 Snoring: Secondary | ICD-10-CM | POA: Insufficient documentation

## 2021-01-14 DIAGNOSIS — I428 Other cardiomyopathies: Secondary | ICD-10-CM | POA: Insufficient documentation

## 2021-01-14 DIAGNOSIS — I350 Nonrheumatic aortic (valve) stenosis: Secondary | ICD-10-CM | POA: Diagnosis not present

## 2021-01-14 DIAGNOSIS — I493 Ventricular premature depolarization: Secondary | ICD-10-CM | POA: Insufficient documentation

## 2021-01-14 DIAGNOSIS — Z7982 Long term (current) use of aspirin: Secondary | ICD-10-CM | POA: Insufficient documentation

## 2021-01-14 DIAGNOSIS — F1010 Alcohol abuse, uncomplicated: Secondary | ICD-10-CM | POA: Diagnosis not present

## 2021-01-14 DIAGNOSIS — I5042 Chronic combined systolic (congestive) and diastolic (congestive) heart failure: Secondary | ICD-10-CM

## 2021-01-14 NOTE — Progress Notes (Signed)
ReDS Vest / Clip - 01/14/21 0900       ReDS Vest / Clip   Station Marker D    Ruler Value 31.5    ReDS Value Range Moderate volume overload    ReDS Actual Value 39

## 2021-01-14 NOTE — Patient Instructions (Signed)
INCREASE Lasix to 40 mg, daily for 4 days, then resume 20 mg daily thereafter  Your provider has recommended that  you wear a Zio Patch for 3 days.  This monitor will record your heart rhythm for our review.  IF you have any symptoms while wearing the monitor please press the button.  If you have any issues with the patch or you notice a red or orange light on it please call the company at [phone].  Once you remove the patch please mail it back to the company as soon as possible so we can get the results.  Your physician has recommended that you have a sleep study. This test records several body functions during sleep, including: brain activity, eye movement, oxygen and carbon dioxide blood levels, heart rate and rhythm, breathing rate and rhythm, the flow of air through your mouth and nose, snoring, body muscle movements, and chest and belly movement.  Your physician has requested that you have an echocardiogram. Echocardiography is a painless test that uses sound waves to create images of your heart. It provides your doctor with information about the size and shape of your heart and how well your heart's chambers and valves are working. This procedure takes approximately one hour. There are no restrictions for this procedure.  Your physician recommends that you schedule a follow-up appointment in: 6 weeks with Dr Aundra Dubin  Do the following things EVERYDAY: Weigh yourself in the morning before breakfast. Write it down and keep it in a log. Take your medicines as prescribed Eat low salt foods--Limit salt (sodium) to 2000 mg per day.  Stay as active as you can everyday Limit all fluids for the day to less than 2 liters  At the [facility], you and your health needs are our priority. As part of our continuing mission to provide you with exceptional heart care, we have created designated Provider Care Teams. These Care Teams include your primary Cardiologist (physician) and  Advanced Practice Providers (APPs- Physician Assistants and Nurse Practitioners) who all work together to provide you with the care you need, when you need it.   You may see any of the following providers on your designated Care Team at your next follow up: Dr Glori Bickers Dr Haynes Kerns, NP Lyda Jester, [place] [facility] [place], [place] Audry Riles, PharmD   Please be sure to bring in all your medications bottles to every appointment.    If you have any questions or concerns before your next appointment please send [place] a message through [place] or call our office at [phone].    TO LEAVE A MESSAGE FOR THE NURSE SELECT OPTION 2, PLEASE LEAVE A MESSAGE INCLUDING: YOUR NAME DATE OF BIRTH CALL BACK NUMBER REASON FOR CALL**this is important as we prioritize the call backs  YOU WILL RECEIVE A CALL BACK THE SAME DAY AS LONG AS YOU CALL BEFORE 4:00 PM

## 2021-01-14 NOTE — Progress Notes (Signed)
PCP: Unk Pinto, MD HF Cardiology: Dr Aundra Dubin  EP: Dr Curt Bears  HPI: 60 y.o. with history of cardiomyopathy and aortic stenosis returns for followup in CHF.  In 2004, he was diagnosed with nonischemic cardiomyopathy, EF 20% at that time.  His EF recovered back up to 60-65%, but he was noted to have moderate aortic stenosis with a bicuspid valve.    He was in the ER in 1/20 with SVT that converted spontaneously.  He was set up for an echo and sent back to cardiology.  Echo showed EF 25-30%, diffuse hypokinesis, probably severe aortic stenosis with bicuspid aortic valve.  He was referred for RHC/LHC.     RHC/LHC was done 2/20.  He had moderate coronary disease but no critical stenoses and no disease to explain his cardiomyopathy.  RHC showed markedly elevated PCWP, mixed pulmonary venous/pulmonary arterial hypertension, and low CI at 1.7.  I decided to admit him for diuresis, optimization of meds, and further workup of aortic stenosis.  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  He was evaluated by the structural heart team but valve area thought to be too big for the existing TAVR valves.  Therefore, surgical AVR was recommended.    On 04/10/18 he underwent bioprosthetic AVR with supracoronary ascending aorta replacement  by Dr Roxy Manns.  He was placed on amiodarone due to increased PVCs post-op.  Discharge weight was 224 pounds.   Echo in 8/20 showed EF 40-45% with mild LVH, septal-lateral dyssynchrony, normal bioprosthetic aortic valve.  Zio patch in 12/20 showed 15.9% PVCs and NSVT runs.  Amiodarone was started. He saw Dr. Curt Bears and amiodarone was stopped, mexiletine was started.  Zio patch in 4/21 showed PVC burden down to 3.4%.  Echo was done today and reviewed, EF 55-60%, normal RV, bioprosthetic aortic valve with mean gradient 10 mmHg.  Today he returns for HF follow up with his husband. Recently saw PCP and was having lots PVCs on EKG. Overall feeling ok. Complaining of some  shortness of breath.  Denies /PND/Orthopnea. His husband says he snores.  Appetite ok. No fever or chills. Weight at home has been trending up. Having some swelling in his legs. Drinking 2 beers a day. Taking all medications.  Labs (8/20): LDL 69, HDL 49, K 3.8, creatinine 1.22  Labs (9/20): K 4.2, creatinine 1.08 Labs (12/20): K 3.4, creatinine 1.22  Labs (3/21): K 4.4, creatinine 1.24 Labs (8/21): K 4.6, creatinine 0.93, LDL 84, HDL 50  PMH: 1. SVT 2. HTN 3. Prior ETOH abuse 4. Prior smoking 5. Chronic systolic CHF: Nonischemic cardiomyopathy.  - Echo (2004) with EF 20%.  - Echo (1/20): EF 25-30%, mild LVH, moderate LV dilation, bicuspid aortic valve with severe AS.  - RHC (2/20): mean RA 12, PA 70/32 mean 47, mean PCWP 30, CI 1.72, PVR 4.2.  - Echo (9/20): EF 40-45%, mild LVH, septal-lateral dyssynchrony, normal RV size and systolic function, bioprosthetic AVR functions normally.  - Echo (10/21): EF 55-60%, normal RV, bioprosthetic aortic valve with mean gradient 10 mmHg. 6. LBBB 7. CAD: LHC (2/20) witih 80% stenosis small D1, 50% stenosis moderate D2, 50% mid LCx, 50% distal RCA.  8. Biscuspid aortic valve disorder: Severe AS and dilated ascending aorta.   - 3/20 bioprosthetic AVR + supracoronary ascending aorta replacement.  9. PVCs: Zio patch 12/20 showed 15.9% PVCs, NSVT runs.  - Zio patch (4/21): 3.4% PVCs, multiple morphologies.   ROS: All systems negative except as listed in HPI, PMH and Problem List.  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  Two children.     Social Determinants of Health   Financial Resource Strain: Not on file  Food Insecurity: Not on file  Transportation Needs: Not on file  Physical Activity: Not on file  Stress: Not on file  Social Connections: Not on file  Intimate Partner Violence: Not on file    FH:  Family History  Problem Relation Age of Onset   Diabetes Mother    Hypertension Mother    Alzheimer's disease Mother    Breast cancer Mother 69       breast   Hypertension Father    Cancer Father        laryngeal, former smoker   Hypertension Sister    Diabetes Son    Diabetes Daughter      Current Outpatient Medications  Medication Sig Dispense Refill   acetaminophen (TYLENOL) 500 MG tablet Take 1,000 mg by mouth every 6 (six) hours as needed for mild pain or headache.     ALPRAZolam (XANAX) 0.5 MG tablet TAKE 1/2-1 TABLET BY MOUTH AT BEDTIME ONLY IF NEEDED FOR SLEEP AND LIMIT TO 5 DAYS A WEEK TO AVOID ADDICTION AND DEMENTIA 30 tablet 0   aspirin EC 81 MG tablet Take 81 mg by mouth daily. Swallow whole.     busPIRone (BUSPAR) 10 MG tablet TAKE 1 TABLET BY MOUTH THREE TIMES DAILY FOR CHRONIC ANXIETY AND MOOD 270 tablet 3   Cholecalciferol (VITAMIN D3) 125 MCG (5000 UT) TABS Take 5,000 Units by mouth daily.      empagliflozin (JARDIANCE) 10 MG TABS tablet Take 1 tablet (10 mg total) by mouth daily. 30 tablet 3   eplerenone (INSPRA) 50 MG tablet Take 1 tablet by mouth once daily 30 tablet 3   ezetimibe (ZETIA) 10 MG tablet Take 1 tablet (10 mg total) by mouth daily. 90 tablet 3   furosemide (LASIX) 20 MG tablet Take 1 tablet (20 mg total) by mouth daily. 90  tablet 3   metoprolol succinate (TOPROL-XL) 100 MG 24 hr tablet Take 1 tablet (100 mg total) by mouth 2 (two) times daily in the morning and at bedtime. Take with or immediately following a meal. 60 tablet 4   mexiletine (MEXITIL) 250 MG capsule Take 1 capsule (250 mg total) by mouth 2 (two) times daily. 60 capsule 2   pantoprazole (PROTONIX) 40 MG tablet Take 1 tablet by mouth once daily 30 tablet 11   potassium chloride (KLOR-CON) 10 MEQ tablet Take 1 tablet by  mouth once daily 90 tablet 3   rosuvastatin (CRESTOR) 40 MG tablet Take 1 tablet by mouth once daily 90 tablet 2   sacubitril-valsartan (ENTRESTO) 97-103 MG Take 1 tablet by mouth 2 (two) times daily. 180 tablet 3   sertraline (ZOLOFT) 100 MG tablet Take 1 tablet (100 mg total) by mouth daily. 90 tablet 3   No current facility-administered medications for this encounter.    Vitals:   01/14/21 0905  BP: 128/88  Pulse: 76  SpO2: 96%  Weight: 114.8 kg (253 lb)   Wt Readings from Last 3 Encounters:  01/14/21 114.8 kg (253 lb)  12/28/20 113.5 kg (250 lb 3.2 oz)  05/12/20 105.2 kg (232 lb)   Reds Clip 39%.    PHYSICAL EXAM: General:  Well appearing. No resp difficulty HEENT: normal Neck: supple. JVP 10-11  Carotids 2+ bilat; no bruits. No lymphadenopathy or thryomegaly appreciated. Cor: PMI nondisplaced. Irrgular rate & rhythm. No rubs, gallops or murmurs. Lungs: clear Abdomen: soft, nontender, nondistended. No hepatosplenomegaly. No bruits or masses. Good bowel sounds. Extremities: no cyanosis, clubbing, rash, R and LLE trace edema Neuro: alert & orientedx3, cranial nerves grossly intact. moves all 4 extremities w/o difficulty. Affect pleasant  EKG: SR with frequent PVCs 77 bpms QRS 132 ms personally reviewed.   ASSESSMENT & PLAN: 1. Chronic systolic CHF: Nonischemic cardiomyopathy. This pre-existed severe AS, ?if PVCs play a role.  Echo in 9/20 showed EF up to 40-45%, and echo 11/2019 EF up to 55-60%.   Reds Clip 39%.    - NYHA II-III. Volume status mildly elevated. Instructed to increase lasix to 40 mg daily for the 4 days the go back to lasix 20 mg daily.  - Continue Entresto 97/103 bid.  - Toprol XL 100 mg bid.   - Continue eplerenone 50 mg daily (painful gynecomastia with spironolactone, now resolved).   - Continue empagliflozin 10 mg daily.  - Needs to repeat ECHO with functional decline. ? PVCs  burden has gone back up.  2. Bicuspid aortic valve disorder: Severe AS, now s/p surgical AVR (annulus too large for TAVR) and ascending aorta replacement.  Echo in 10/21 showed a normally functioning bioprosthetic aortic valve.  - He will need endocarditis prophylaxis with dental work.  3. ETOH abuse: Drinking a couple beers a day. Discussed limiting alcohol.   4. PVCs: 15.9% PVCs on 12/20 Zio patch (symptomatic).  Amiodarone started with improvement.  To prevent long-term amiodarone use, he was transitioned to mexiletine. Repeat Zio patch in 4/21 showed PVC burden down to 3.4%.  Multiple morphologies of PVCs, would not be amenable to ablation.   - Frequent PVCs on EKG. Place Zio Patch to quantify. If PVC burden back up will send back to EP. - Continue mexiletine.  - Will need to obtain sleep study.  5. CAD: Moderate nonobstructive CAD on 2/20 cath.   - No chest pain.  - Continue Crestor - Continue ASA.   6. LBBB: Chronic.  7. Snores Set up sleep study.   Follow up with Dr Aundra Dubin in 6-8 weeks.   Deziyah Arvin NP-C  01/14/2021

## 2021-01-15 ENCOUNTER — Other Ambulatory Visit (HOSPITAL_COMMUNITY): Payer: Self-pay | Admitting: Cardiology

## 2021-01-18 ENCOUNTER — Other Ambulatory Visit (HOSPITAL_COMMUNITY): Payer: Self-pay

## 2021-01-22 DIAGNOSIS — I493 Ventricular premature depolarization: Secondary | ICD-10-CM | POA: Diagnosis not present

## 2021-01-28 ENCOUNTER — Other Ambulatory Visit (HOSPITAL_COMMUNITY): Payer: Self-pay | Admitting: Cardiology

## 2021-01-28 ENCOUNTER — Other Ambulatory Visit (HOSPITAL_COMMUNITY): Payer: Self-pay

## 2021-01-28 MED ORDER — MEXILETINE HCL 250 MG PO CAPS
250.0000 mg | ORAL_CAPSULE | Freq: Two times a day (BID) | ORAL | 2 refills | Status: DC
  Filled 2021-01-28: qty 60, 30d supply, fill #0

## 2021-02-02 ENCOUNTER — Other Ambulatory Visit (HOSPITAL_COMMUNITY): Payer: Self-pay

## 2021-02-09 ENCOUNTER — Telehealth (HOSPITAL_COMMUNITY): Payer: Self-pay

## 2021-02-09 DIAGNOSIS — I5042 Chronic combined systolic (congestive) and diastolic (congestive) heart failure: Secondary | ICD-10-CM

## 2021-02-09 NOTE — Telephone Encounter (Signed)
Orders Placed This Encounter  Procedures   Ambulatory referral to Cardiac Electrophysiology    Referral Priority:   Routine    Referral Type:   Consultation    Referral Reason:   Specialty Services Required    Referred to Provider:   Constance Haw, MD    Requested Specialty:   Cardiology    Number of Visits Requested:   1   Patient advised and verbalized understanding. Referral placed

## 2021-02-09 NOTE — Telephone Encounter (Signed)
-----   Message from Rafael Bihari, [place] sent at 02/02/2021  8:18 AM EST ----- Mostly sinus rhythm, however PVC burden has increased to 9.7% (previously 3.4%). Please refer back to EP

## 2021-02-12 ENCOUNTER — Encounter: Payer: Self-pay | Admitting: Cardiology

## 2021-02-12 ENCOUNTER — Other Ambulatory Visit: Payer: Self-pay

## 2021-02-12 ENCOUNTER — Ambulatory Visit: Payer: Medicare Other | Admitting: Cardiology

## 2021-02-12 VITALS — BP 124/90 | HR 77 | Ht 75.0 in | Wt 253.6 lb

## 2021-02-12 DIAGNOSIS — I493 Ventricular premature depolarization: Secondary | ICD-10-CM

## 2021-02-12 MED ORDER — MEXILETINE HCL 150 MG PO CAPS: 150.0000 mg | ORAL_CAPSULE | Freq: Two times a day (BID) | ORAL | 3 refills | Status: DC

## 2021-02-12 MED ORDER — MEXILETINE HCL 150 MG PO CAPS: 300.0000 mg | ORAL_CAPSULE | Freq: Two times a day (BID) | ORAL | 3 refills | Status: DC

## 2021-02-12 NOTE — Progress Notes (Signed)
Electrophysiology Office Note   Date:  02/12/2021   ID:  Tyler Berger, Tyler Berger [birth year], MRN [id number]  PCP:  Unk Pinto, MD  Cardiologist:  Aundra Dubin Primary Electrophysiologist:  Nolberto Cheuvront Meredith Leeds, MD    Chief Complaint: PVC   History of Present Illness: Tyler Berger is a 60 y.o. male who is being seen today for the evaluation of PVC at the request of Lyda Jester M, [place]*. Presenting today for electrophysiology evaluation.  He has a history significant for nonischemic cardiomyopathy, SVT, hypertension, prior alcohol and tobacco abuse, mild coronary artery disease, PVCs, and aortic stenosis status post AVR.  Today, denies symptoms of palpitations, chest pain, shortness of breath, orthopnea, PND, lower extremity edema, claudication, dizziness, presyncope, syncope, bleeding, or neurologic sequela. The patient is tolerating medications without difficulties.  Since being seen he has done well.  He has no chest pain or shortness of breath.  He had a repeat cardiac monitor that showed an increase in his PVC burden up to 9.4%.  He is unaware of PVCs.  He does not have shortness of breath or chest pain.  He is able to do his daily activities without issue.   Past Medical History:  Diagnosis Date   Anxiety    Aortic root aneurysm    Aortic stenosis    Ascending aortic aneurysm    CAD (coronary artery disease)    Chronic combined systolic and diastolic congestive heart failure (HCC)    Dilated aortic root (HCC)    Hypertension    Non-ischemic cardiomyopathy (HCC)    Nonischemic cardiomyopathy (HCC)    S/P aortic valve replacement with bioprosthetic valve 04/10/2018   29 mm Edwards Inspiris Resilia stented bovine pericardial tissue valve   S/P ascending aortic aneurysm repair 04/10/2018   26 mm Hemashield Platinum supracoronary straight graft   SVT (supraventricular tachycardia) (HCC)    Viral myocarditis 2004   Vitamin D deficiency    Past Surgical History:  Procedure Laterality  Date   AORTIC VALVE REPLACEMENT N/A 04/10/2018   Procedure: Aortic Valve Replacement (Avr), USING INSPIRIS 29MM;  Surgeon: Rexene Alberts, MD;  Location: [place];  Service: Open Heart Surgery;  Laterality: N/A;   ASCENDING AORTIC ROOT REPLACEMENT N/A 04/10/2018   Procedure: RESECTION AND GRAFTING OF ASCENDING AORTIC ROOT ANUERYSM (SUPRACORONARY);  Surgeon: Rexene Alberts, MD;  Location: [place];  Service: Open Heart Surgery;  Laterality: N/A;   HERNIA REPAIR Left 2010   MULTIPLE EXTRACTIONS WITH ALVEOLOPLASTY N/A 04/05/2018   Procedure: Extraction of tooth #'s 3, 12, 14,19, 23, 31 and 32 with alveoloplasty and gross debridement of remaining teeth;  Surgeon: Lenn Cal, DDS;  Location: [place];  Service: Oral Surgery;  Laterality: N/A;   ORCHIECTOMY Left 1982   RIGHT HEART CATH AND CORONARY ANGIOGRAPHY N/A 03/28/2018   Procedure: RIGHT HEART CATH AND CORONARY ANGIOGRAPHY;  Surgeon: Larey Dresser, MD;  Location: [facility];  Service: Cardiovascular;  Laterality: N/A;   TEE WITHOUT CARDIOVERSION N/A 04/10/2018   Procedure: TRANSESOPHAGEAL ECHOCARDIOGRAM (TEE);  Surgeon: Rexene Alberts, MD;  Location: [place];  Service: Open Heart Surgery;  Laterality: N/A;   VARICOSE VEIN SURGERY Left 05/1998     Current Outpatient Medications  Medication Sig Dispense Refill   acetaminophen (TYLENOL) 500 MG tablet Take 1,000 mg by mouth every 6 (six) hours as needed for mild pain or headache.     ALPRAZolam (XANAX) 0.5 MG tablet TAKE 1/2-1 TABLET BY MOUTH AT BEDTIME ONLY IF NEEDED  FOR SLEEP AND LIMIT TO 5 DAYS A WEEK TO AVOID ADDICTION AND DEMENTIA 30 tablet 0   aspirin EC 81 MG tablet Take 81 mg by mouth daily. Swallow whole.     busPIRone (BUSPAR) 10 MG tablet TAKE 1 TABLET BY MOUTH THREE TIMES DAILY FOR CHRONIC ANXIETY AND MOOD 270 tablet 3   Cholecalciferol (VITAMIN D3) 125 MCG (5000 UT) TABS Take 5,000 Units by mouth daily.      empagliflozin (JARDIANCE) 10 MG TABS tablet Take 1 tablet (10 mg  total) by mouth daily. 30 tablet 3   eplerenone (INSPRA) 50 MG tablet Take 1 tablet by mouth once daily 30 tablet 0   ezetimibe (ZETIA) 10 MG tablet Take 1 tablet (10 mg total) by mouth daily. 90 tablet 3   furosemide (LASIX) 20 MG tablet Take 1 tablet (20 mg total) by mouth daily. 90 tablet 3   metoprolol succinate (TOPROL-XL) 100 MG 24 hr tablet Take 1 tablet (100 mg total) by mouth 2 (two) times daily in the morning and at bedtime. Take with or immediately following a meal. 60 tablet 4   pantoprazole (PROTONIX) 40 MG tablet Take 1 tablet by mouth once daily 30 tablet 11   potassium chloride (KLOR-CON) 10 MEQ tablet Take 1 tablet by mouth once daily 90 tablet 3   rosuvastatin (CRESTOR) 40 MG tablet Take 1 tablet by mouth once daily 90 tablet 2   sacubitril-valsartan (ENTRESTO) 97-103 MG Take 1 tablet by mouth 2 (two) times daily. 180 tablet 3   sertraline (ZOLOFT) 100 MG tablet Take 1 tablet (100 mg total) by mouth daily. 90 tablet 3   mexiletine (MEXITIL) 150 MG capsule Take 2 capsules (300 mg total) by mouth 2 (two) times daily. 120 capsule 3   No current facility-administered medications for this visit.    Allergies:   Patient has no known allergies.   Social History:  The patient  reports that he quit smoking about 2 years ago. His smoking use included cigarettes. He started smoking about 43 years ago. He has a 30.00 pack-year smoking history. He has never used smokeless tobacco. He reports current alcohol use of about 1.0 standard drink per week. He reports that he does not use drugs.   Family History:  The patient's family history includes Alzheimer's disease in his mother; Breast cancer (age of onset: 9) in his mother; Cancer in his father; Diabetes in his daughter, mother, and son; Hypertension in his father, mother, and sister.   ROS:  Please see the history of present illness.   Otherwise, review of systems is positive for none.   All other systems are reviewed and negative.    PHYSICAL EXAM: VS:  BP 124/90    Pulse 77    Ht 6\' 3"  (1.905 m)    Wt 253 lb 9.6 oz (115 kg)    SpO2 94%    BMI 31.70 kg/m  , BMI Body mass index is 31.7 kg/m. GEN: Well nourished, well developed, in no acute distress  HEENT: normal  Neck: no JVD, carotid bruits, or masses Cardiac: RRR; no murmurs, rubs, or gallops,no edema  Respiratory:  clear to auscultation bilaterally, normal work of breathing GI: soft, nontender, nondistended, + BS MS: no deformity or atrophy  Skin: warm and dry Neuro:  Strength and sensation are intact Psych: euthymic mood, full affect  EKG:  EKG is not ordered today. Personal review of the ekg ordered 01/14/21 shows sinus rhythm, PVCs, rate 77  Recent Labs: 12/28/2020: ALT  29; BUN 18; Creat 0.95; Hemoglobin 18.8; Platelets 126; Potassium 4.3; Sodium 142    Lipid Panel     Component Value Date/Time   CHOL 143 12/28/2020 1556   TRIG 165 (H) 12/28/2020 1556   HDL 55 12/28/2020 1556   CHOLHDL 2.6 12/28/2020 1556   VLDL 30 02/06/2020 0858   LDLCALC 64 12/28/2020 1556     Wt Readings from Last 3 Encounters:  02/12/21 253 lb 9.6 oz (115 kg)  01/14/21 253 lb (114.8 kg)  12/28/20 250 lb 3.2 oz (113.5 kg)      Other studies Reviewed: Additional studies/ records that were reviewed today include: TTE 10/09/18  Review of the above records today demonstrates:   1. The left ventricle has mild-moderately reduced systolic function, with  an ejection fraction of 40-45%. The cavity size was normal. There is  mildly increased left ventricular wall thickness. Left ventricular  diastolic Doppler parameters are consistent   with impaired relaxation. Left ventricular diffuse hypokinesis.  Septal-lateral dyssynchrony consistent with LBBB.   2. There is mild mitral annular calcification present. No evidence of  mitral valve stenosis. No mitral regurgitation.   3. Bioprosthetic aortic valve. Mean gradient 12 mmHg. No significant  stenosis or regurgitation.   4. S/p  surgical repair ascending aorta.   5. The right ventricle has normal systolic function. The cavity was  normal. There is no increase in right ventricular wall thickness.   6. Normal IVC size. PA systolic pressure 20 mmHg.   7. Trivial pericardial effusion is present.   Cardiac monitor 06/28/2019 personally reviewed 1. Predominantly NSR. 2. PVCs 3.4% of total beats.   ASSESSMENT AND PLAN:  1.  PVCs: 15.9% noted on ZIO monitor.  Was started on amiodarone with a reduced burden to 3.4%.  He was switched to mexiletine due to his young age to avoid amiodarone toxicities.  High risk medication monitoring for mexiletine.  He has a bioprosthetic aortic valve and thus if ablation were necessary, would need to be transseptal.  He also has multiple PVC morphologies.  As his PVC burden has gone up, Gabriele Zwilling increase mexiletine to 300 mg twice daily.  2.  Chronic systolic heart failure due to nonischemic cardiomyopathy: Ejection fraction 40 to 45%.  Currently on Entresto, Toprol-XL, eplerenone, Lasix.  No obvious volume overload.  3.  Severe aortic stenosis with bicuspid aortic valve: Status post AVR.  Plan per primary cardiology.  4.  Coronary artery disease: Moderate nonobstructive disease on catheterization in 2020.  No current chest pain.  Current medicines are reviewed at length with the patient today.   The patient does not have concerns regarding his medicines.  The following changes were made today: Increase mexiletine  Labs/ tests ordered today include:  No orders of the defined types were placed in this encounter.    Disposition:   FU with Rjay Revolorio 3 months  Signed, Yui Mulvaney Meredith Leeds, MD  02/12/2021 1:41 PM     [place] [place] [address] [phone] (office) [phone] (fax)

## 2021-02-12 NOTE — Patient Instructions (Signed)
Medication Instructions:  Your physician has recommended you make the following change in your medication:  INCREASE Mexiletine to 300 mg twice daily  *If you need a refill on your cardiac medications before your next appointment, please call your pharmacy*   Lab Work: None ordered If you have labs (blood work) drawn today and your tests are completely normal, you will receive your results only by: [place] (if you have MyChart) OR A paper copy in the mail If you have any lab test that is abnormal or we need to change your treatment, we will call you to review the results.   Testing/Procedures: None ordered   Follow-Up: At [facility], you and your health needs are our priority.  As part of our continuing mission to provide you with exceptional heart care, we have created designated Provider Care Teams.  These Care Teams include your primary Cardiologist (physician) and Advanced Practice Providers (APPs -  Physician Assistants and Nurse Practitioners) who all work together to provide you with the care you need, when you need it.  We recommend signing up for the patient portal called "MyChart".  Sign up information is provided on this After Visit Summary.  MyChart is used to connect with patients for Virtual Visits (Telemedicine).  Patients are able to view lab/test results, encounter notes, upcoming appointments, etc.  Non-urgent messages can be sent to your provider as well.   To learn more about what you can do with MyChart, go to [email or web address].    Your next appointment:   3 month(s)  The format for your next appointment:   In Person  Provider:   You will see one of the following Advanced Practice Providers on your designated Care Team:   Tommye Standard, [place] Legrand Como "Jonni Sanger" Chalmers Cater, [place]     Thank you for choosing [facility]!!   Trinidad Curet, RN [phone]

## 2021-02-16 ENCOUNTER — Other Ambulatory Visit (HOSPITAL_COMMUNITY): Payer: Self-pay | Admitting: Cardiology

## 2021-02-17 ENCOUNTER — Encounter: Payer: Self-pay | Admitting: Cardiology

## 2021-02-20 ENCOUNTER — Other Ambulatory Visit (HOSPITAL_COMMUNITY): Payer: Self-pay | Admitting: Cardiology

## 2021-02-22 ENCOUNTER — Other Ambulatory Visit (HOSPITAL_COMMUNITY): Payer: Self-pay

## 2021-02-26 ENCOUNTER — Encounter (HOSPITAL_COMMUNITY): Payer: Self-pay | Admitting: Cardiology

## 2021-02-26 ENCOUNTER — Ambulatory Visit (HOSPITAL_BASED_OUTPATIENT_CLINIC_OR_DEPARTMENT_OTHER)
Admission: RE | Admit: 2021-02-26 | Discharge: 2021-02-26 | Disposition: A | Discharge status: Home / Self Care | Payer: Medicare Other | Source: Ambulatory Visit | Attending: Cardiology | Admitting: Cardiology

## 2021-02-26 ENCOUNTER — Other Ambulatory Visit: Payer: Self-pay

## 2021-02-26 ENCOUNTER — Ambulatory Visit (HOSPITAL_COMMUNITY)
Admission: RE | Admit: 2021-02-26 | Discharge: 2021-02-26 | Disposition: A | Discharge status: Home / Self Care | Payer: Medicare Other | Source: Ambulatory Visit | Attending: Cardiology | Admitting: Cardiology

## 2021-02-26 VITALS — BP 130/80 | HR 68 | Wt 246.8 lb

## 2021-02-26 DIAGNOSIS — Q231 Congenital insufficiency of aortic valve: Secondary | ICD-10-CM | POA: Diagnosis not present

## 2021-02-26 DIAGNOSIS — Z7982 Long term (current) use of aspirin: Secondary | ICD-10-CM | POA: Diagnosis not present

## 2021-02-26 DIAGNOSIS — I5042 Chronic combined systolic (congestive) and diastolic (congestive) heart failure: Secondary | ICD-10-CM | POA: Diagnosis not present

## 2021-02-26 DIAGNOSIS — I2510 Atherosclerotic heart disease of native coronary artery without angina pectoris: Secondary | ICD-10-CM | POA: Insufficient documentation

## 2021-02-26 DIAGNOSIS — I428 Other cardiomyopathies: Secondary | ICD-10-CM | POA: Insufficient documentation

## 2021-02-26 DIAGNOSIS — Z79899 Other long term (current) drug therapy: Secondary | ICD-10-CM | POA: Insufficient documentation

## 2021-02-26 DIAGNOSIS — I447 Left bundle-branch block, unspecified: Secondary | ICD-10-CM | POA: Diagnosis not present

## 2021-02-26 DIAGNOSIS — Z953 Presence of xenogenic heart valve: Secondary | ICD-10-CM | POA: Diagnosis not present

## 2021-02-26 DIAGNOSIS — I110 Hypertensive heart disease with heart failure: Secondary | ICD-10-CM | POA: Diagnosis not present

## 2021-02-26 DIAGNOSIS — I2721 Secondary pulmonary arterial hypertension: Secondary | ICD-10-CM | POA: Diagnosis not present

## 2021-02-26 DIAGNOSIS — F1090 Alcohol use, unspecified, uncomplicated: Secondary | ICD-10-CM | POA: Diagnosis not present

## 2021-02-26 DIAGNOSIS — Z7984 Long term (current) use of oral hypoglycemic drugs: Secondary | ICD-10-CM | POA: Insufficient documentation

## 2021-02-26 DIAGNOSIS — E785 Hyperlipidemia, unspecified: Secondary | ICD-10-CM | POA: Diagnosis not present

## 2021-02-26 LAB — ECHOCARDIOGRAM COMPLETE
AV Mean grad: 12 mmHg
AV Peak grad: 22.1 mmHg
Ao pk vel: 2.35 m/s
Area-P 1/2: 5.54 cm2
Calc EF: 47.6 %
S' Lateral: 4.1 cm
Single Plane A2C EF: 53 %
Single Plane A4C EF: 40.5 %

## 2021-02-26 LAB — BASIC METABOLIC PANEL
Anion gap: 8 (ref 5–15)
BUN: 18 mg/dL (ref 8–23)
CO2: 23 mmol/L (ref 22–32)
Calcium: 9.6 mg/dL (ref 8.9–10.3)
Chloride: 101 mmol/L (ref 98–111)
Creatinine, Ser: 1.1 mg/dL (ref 0.61–1.24)
GFR, Estimated: >60 mL/min (ref >60)
Glucose, Bld: 130 mg/dL — ABNORMAL HIGH (ref 70–99)
Potassium: 4.4 mmol/L (ref 3.5–5.1)
Sodium: 132 mmol/L — ABNORMAL LOW (ref 135–145)

## 2021-02-26 LAB — MAGNESIUM: Magnesium: 2.2 mg/dL (ref 1.7–2.4)

## 2021-02-26 NOTE — Progress Notes (Signed)
°  Echocardiogram 2D Echocardiogram has been performed.  Tyler Berger 02/26/2021, 8:49 AM

## 2021-02-26 NOTE — Patient Instructions (Signed)
Labs done today, your results will be available in MyChart, we will contact you for abnormal readings.  Your physician recommends that you schedule a follow-up appointment in: 6 months (July 2023), **PLEASE CALL OUR OFFICE IN MAY TO SCHEDULE THIS APPOINTMENT  If you have any questions or concerns before your next appointment please send [place] a message through [place] or call our office at [phone].    TO LEAVE A MESSAGE FOR THE NURSE SELECT OPTION 2, PLEASE LEAVE A MESSAGE INCLUDING: YOUR NAME DATE OF BIRTH CALL BACK NUMBER REASON FOR CALL**this is important as we prioritize the call backs  YOU WILL RECEIVE A CALL BACK THE SAME DAY AS LONG AS YOU CALL BEFORE 4:00 PM  At the [facility], you and your health needs are our priority. As part of our continuing mission to provide you with exceptional heart care, we have created designated Provider Care Teams. These Care Teams include your primary Cardiologist (physician) and Advanced Practice Providers (APPs- Physician Assistants and Nurse Practitioners) who all work together to provide you with the care you need, when you need it.   You may see any of the following providers on your designated Care Team at your next follow up: Dr Glori Bickers Dr Haynes Kerns, NP Lyda Jester, [place] [facility] [place], [place] Audry Riles, PharmD   Please be sure to bring in all your medications bottles to every appointment.

## 2021-02-28 NOTE — Progress Notes (Signed)
PCP: Unk Pinto, MD HF Cardiology: Dr Aundra Dubin   HPI: 61 y.o. with history of cardiomyopathy and aortic stenosis returns for followup in CHF.  In 2004, he was diagnosed with nonischemic cardiomyopathy, EF 20% at that time.  His EF recovered back up to 60-65%, but he was noted to have moderate aortic stenosis with a bicuspid valve. He was not seen by cardiology for a number of years.    He was in the ER in 1/20 with SVT that converted spontaneously.  He was set up for an echo and sent back to cardiology.  Echo showed EF 25-30%, diffuse hypokinesis, probably severe aortic stenosis with bicuspid aortic valve.  He was referred for RHC/LHC.     RHC/LHC was done 2/20.  He had moderate coronary disease but no critical stenoses and no disease to explain his cardiomyopathy.  RHC showed markedly elevated PCWP, mixed pulmonary venous/pulmonary arterial hypertension, and low CI at 1.7.  I decided to admit him for diuresis, optimization of meds, and further workup of aortic stenosis.  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  He was evaluated by the structural heart team but valve area thought to be too big for the existing TAVR valves.  Therefore, surgical AVR was recommended.    On 04/10/18 he underwent bioprosthetic AVR with supracoronary ascending aorta replacement  by Dr Roxy Manns.  He was placed on amiodarone due to increased PVCs post-op.  Discharge weight was 224 pounds.   Echo in 8/20 showed EF 40-45% with mild LVH, septal-lateral dyssynchrony, normal bioprosthetic aortic valve.  Zio patch in 12/20 showed 15.9% PVCs and NSVT runs.  Amiodarone was started. He saw Dr. Curt Bears and amiodarone was stopped, mexiletine was started.  Zio patch in 4/21 showed PVC burden down to 3.4%.  Echo in 10/21 showed EF 55-60%, normal RV, bioprosthetic aortic valve with mean gradient 10 mmHg.  Zio monitor in 12/22 showed 9.7% PVCs, mexiletine was increased by EP.  Echo was done today and reviewed, EF 55-60% with  bioprosthetic aortic valve mean gradient 12 mmHg, normal RV.   He is stable symptomatically.  Not feeling palpitations.  No significant exertional dyspnea with normal ADLs and walking on flat ground.  No chest pain.  No orthopnea/PND.  Weight down 7 lbs.   Labs (8/20): LDL 69, HDL 49, K 3.8, creatinine 1.22  Labs (9/20): K 4.2, creatinine 1.08 Labs (12/20): K 3.4, creatinine 1.22  Labs (3/21): K 4.4, creatinine 1.24 Labs (8/21): K 4.6, creatinine 0.93, LDL 84, HDL 50 Labs (11/22): LDL 64, K 4.3, creatinine 0.95  ECG (personally reviewed): NSR, IVCD 130 msec  PMH: 1. SVT 2. HTN 3. Prior ETOH abuse 4. Prior smoking 5. Chronic systolic CHF: Nonischemic cardiomyopathy.  - Echo (2004) with EF 20%.  - Echo (1/20): EF 25-30%, mild LVH, moderate LV dilation, bicuspid aortic valve with severe AS.  - RHC (2/20): mean RA 12, PA 70/32 mean 47, mean PCWP 30, CI 1.72, PVR 4.2.  - Echo (9/20): EF 40-45%, mild LVH, septal-lateral dyssynchrony, normal RV size and systolic function, bioprosthetic AVR functions normally.  - Echo (10/21): EF 55-60%, normal RV, bioprosthetic aortic valve with mean gradient 10 mmHg. - Echo (1/23): EF 55-60% with bioprosthetic aortic valve mean gradient 12 mmHg, normal RV. 6. LBBB 7. CAD: LHC (2/20) witih 80% stenosis small D1, 50% stenosis moderate D2, 50% mid LCx, 50% distal RCA.  8. Biscuspid aortic valve disorder: Severe AS and dilated ascending aorta.   - 3/20 bioprosthetic AVR + supracoronary ascending  aorta replacement.  9. PVCs: Zio patch 12/20 showed 15.9% PVCs, NSVT runs.  - Zio patch (4/21): 3.4% PVCs, multiple morphologies.  - Zio patch (12/22): 9.7% PVCs  ROS: All systems negative except as listed in HPI, PMH and Problem List.  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  Two children.     Social Determinants of Health   Financial Resource Strain: Not on file  Food Insecurity: Not on file  Transportation Needs: Not on file  Physical Activity: Not on file  Stress: Not on file  Social Connections: Not on file  Intimate Partner Violence: Not on file    FH:  Family History  Problem Relation Age of Onset   Diabetes Mother    Hypertension Mother    Alzheimer's disease Mother    Breast cancer Mother 32       breast   Hypertension Father    Cancer Father        laryngeal, former smoker   Hypertension Sister    Diabetes Son    Diabetes Daughter      Current Outpatient Medications  Medication Sig Dispense Refill   acetaminophen (TYLENOL) 500 MG tablet Take 1,000 mg by mouth every 6 (six) hours as needed for mild pain or headache.     ALPRAZolam (XANAX) 0.5 MG tablet TAKE 1/2-1 TABLET BY MOUTH AT BEDTIME ONLY IF NEEDED FOR SLEEP AND LIMIT TO 5 DAYS A WEEK TO AVOID ADDICTION AND DEMENTIA 30 tablet 0   aspirin EC 81 MG tablet Take 81 mg by mouth daily. Swallow whole.     busPIRone (BUSPAR) 10 MG tablet TAKE 1 TABLET BY MOUTH THREE TIMES DAILY FOR CHRONIC ANXIETY AND MOOD 270 tablet 3   Cholecalciferol (VITAMIN D3) 125 MCG (5000 UT) TABS Take 5,000 Units by mouth daily.      empagliflozin (JARDIANCE) 10 MG TABS tablet Take 1 tablet (10 mg total) by mouth daily. 30 tablet 3    eplerenone (INSPRA) 50 MG tablet Take 1 tablet by mouth once daily 30 tablet 11   ezetimibe (ZETIA) 10 MG tablet Take 1 tablet (10 mg total) by mouth daily. 90 tablet 3   furosemide (LASIX) 20 MG tablet Take 1 tablet by mouth once daily 90 tablet 3   metoprolol succinate (TOPROL-XL) 100 MG 24 hr tablet Take 1 tablet (100 mg total) by mouth 2 (two) times daily in the morning and at bedtime. Take with or immediately following a meal. 60 tablet 4   mexiletine (MEXITIL) 150 MG capsule  Take 2 capsules (300 mg total) by mouth 2 (two) times daily. 120 capsule 3   pantoprazole (PROTONIX) 40 MG tablet Take 1 tablet by mouth once daily 30 tablet 11   potassium chloride (KLOR-CON) 10 MEQ tablet Take 1 tablet by mouth once daily 90 tablet 3   rosuvastatin (CRESTOR) 40 MG tablet Take 1 tablet by mouth once daily 90 tablet 2   sacubitril-valsartan (ENTRESTO) 97-103 MG Take 1 tablet by mouth 2 (two) times daily. 180 tablet 3   sertraline (ZOLOFT) 100 MG tablet Take 1 tablet (100 mg total) by mouth daily. 90 tablet 3   No current facility-administered medications for this encounter.    Vitals:   02/26/21 0843  BP: 130/80  Pulse: 68  SpO2: 95%  Weight: 111.9 kg (246 lb 12.8 oz)   Wt Readings from Last 3 Encounters:  02/26/21 111.9 kg (246 lb 12.8 oz)  02/12/21 115 kg (253 lb 9.6 oz)  01/14/21 114.8 kg (253 lb)    PHYSICAL EXAM: General: NAD Neck: No JVD, no thyromegaly or thyroid nodule.  Lungs: Clear to auscultation bilaterally with normal respiratory effort. CV: Nondisplaced PMI.  Heart regular S1/S2, no S3/S4, 1/6 SEM RUSB.  No peripheral edema.  No carotid bruit.  Normal pedal pulses.  Abdomen: Soft, nontender, no hepatosplenomegaly, no distention.  Skin: Intact without lesions or rashes.  Neurologic: Alert and oriented x 3.  Psych: Normal affect. Extremities: No clubbing or cyanosis.  HEENT: Normal.   ASSESSMENT & PLAN: 1. Chronic systolic CHF: Nonischemic cardiomyopathy. This pre-existed  severe AS, ?if PVCs play a role.  Echo in 9/20 showed EF up to 40-45%, and echo today showed EF 55-60%.  He is not volume overloaded on exam, NYHA class II.  - Continue Entresto 97/103 bid. BMET today.  - Toprol XL 100 mg bid.   - Continue eplerenone 50 mg daily (painful gynecomastia with spironolactone, now resolved).   - Continue Lasix 20 mg daily.  Continue KCl 40 daily.    - Continue empagliflozin 10 mg daily.  - EF is now out of ICD range.   2. Bicuspid aortic valve disorder: Severe AS, now s/p surgical AVR (annulus too large for TAVR) and ascending aorta replacement.  Echo in today showed a normally functioning bioprosthetic aortic valve.  - He will need endocarditis prophylaxis with dental work.  3. ETOH abuse: Rarely drinks alcohol now.  4. PVCs: 15.9% PVCs on 12/20 Zio patch (symptomatic).  Amiodarone started with improvement.  To prevent long-term amiodarone use, he was transitioned to mexiletine. Repeat Zio patch in 4/21 showed PVC burden down to 3.4%.  Multiple morphologies of PVCs, would not be amenable to ablation.  Zio in 12/22 showed 9.7% PVCs => EP increased dose of mexiletine.  He denies palpitations.  - Continue mexiletine.  5. CAD: Moderate nonobstructive CAD on 2/20 cath.  No chest pain.  I do not think that this contributed to his cardiomyopathy appreciably.  - Continue Crestor, good lipids in 11/22.  - Continue ASA.   6. LBBB-like IVCD: Chronic.   Followup in 6 months with APP  Loralie Champagne 02/28/2021

## 2021-03-03 ENCOUNTER — Other Ambulatory Visit (HOSPITAL_COMMUNITY): Payer: Self-pay | Admitting: Cardiology

## 2021-03-03 ENCOUNTER — Other Ambulatory Visit (HOSPITAL_COMMUNITY): Payer: Self-pay

## 2021-03-03 MED ORDER — EMPAGLIFLOZIN 10 MG PO TABS
10.0000 mg | ORAL_TABLET | Freq: Every day | ORAL | 3 refills | Status: DC
Start: 2021-03-03 — End: 2021-06-28
  Filled 2021-03-03: qty 30, 30d supply, fill #0
  Filled 2021-03-28: qty 30, 30d supply, fill #1
  Filled 2021-04-27: qty 30, 30d supply, fill #2
  Filled 2021-05-28: qty 30, 30d supply, fill #3

## 2021-03-18 ENCOUNTER — Other Ambulatory Visit (HOSPITAL_COMMUNITY): Payer: Self-pay

## 2021-03-18 MED ORDER — MEXILETINE HCL 150 MG PO CAPS: 300.0000 mg | ORAL_CAPSULE | Freq: Two times a day (BID) | ORAL | 5 refills | Status: DC

## 2021-03-29 ENCOUNTER — Other Ambulatory Visit (HOSPITAL_COMMUNITY): Payer: Self-pay

## 2021-03-30 ENCOUNTER — Ambulatory Visit: Payer: Medicare Other | Admitting: Nurse Practitioner

## 2021-04-02 ENCOUNTER — Encounter: Payer: Self-pay | Admitting: Gastroenterology

## 2021-04-05 NOTE — Progress Notes (Signed)
Patient ID: Tyler Berger, male   DOB: 1960-11-02, 61 y.o.   MRN: [id number]  3 MONTH FOLLOW UP  Assessment and Plan:   Essential hypertension Continue medications Monitor blood pressure at home; call if consistently over 130/80 Continue DASH diet.   Reminder to go to the ER if any CP, SOB, nausea, dizziness, severe HA, changes vision/speech, left arm numbness and tingling and jaw pain. - CBC  Chronic combined systolic and diastolic congestive heart failure [facility]) Cardiology managing Continue to maximize medications/blood pressure Excellent progress with weight loss commended- continue Weight daily and bring log of weight and BP to heart failure clinic  Non-ischemic cardiomyopathy ([place]) Try to maximize medications/blood pressure Excellent progress with weight loss - commended,  WEIGH DAILY and bring log of weight and BP to heart failure clinic  SVT (supraventricular tachycardia) (HCC) NSR at this time, remains controlled with meds; cardiology following   PVC (premature ventricular contraction) Continue on mexiletine , metoprolol Cardiology following  S/P aortic valve replacement with bioprosthetic valve + resection ascending thoracic aortic aneurysm Continue cardiology follow up  Hyperlipidemia, unspecified hyperlipidemia type Continue rosuvastatin; defer lipids to cardiology who has been checking, no insurance, has upcoming planning decrease fatty foods increase activity.  - CMP - Lipid panel - TSH  Abnormal glucose (prediabetes) Discussed disease and risks; on jardiance for heart/sugars  Discussed diet/exercise, weight management  Given glucometer sample and strips, can check PRN fasting  Fasting has been well controlled Weight down 32 lb today from last with carbohydrate reduction - A1c  Anxiety Continue zoloft, buspar and xanax AS needed Has done well reducing xanax use Financial stress significant - pending disability  Stress management techniques discussed,  increase water, good sleep hygiene discussed, increase exercise, and increase veggies.   Vitamin D deficiency Continue supplement   Former smoker Quit smoking 2 years ago, congratulated the patient and encouraged him to continue.  30 pack year; Will schedule at next visit, has upcoming colonoscopy he wants to get through first   Continue diet and meds as discussed. Further disposition pending results of labs. Future Appointments  Date Time Provider [place]  04/21/2021 10:30 AM [facility] PREVISIT RM 53 [facility]  05/12/2021  9:00 AM Daryel November, MD [facility]  05/13/2021  2:20 PM Baldwin [place], PA-C [facility]  12/28/2021  3:00 PM Magda Bernheim, NP [facility] None    HPI 61 y.o. male  presents for 6 month follow up on CHF, nonischemic cardiomyopathy, htn, hyperlipidemia, prediabetes, obesity, vit D def.   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 Currently on medicare   Patient is s/p aortic valve replacement with biosynthetic valve and thoracic aortic aneurysm repair on 04/10/2018 by Dr. Roxy Manns, has systolic heart failure with post op TEE showing EF 35-40%. He was placed on amiodarone due to PVCs with improvement, then transitioned to mexiletine, toprol, entresto. Following with Dr. Aundra Dubin at the heart failure clinic. Echo 11/2019 showed EF up to 50-55%, normally functioning valve, grade 1 diastolic dysfunction. Mod nonobstructive CAD per 03/2018 cath. Zio patch in 4/21 showed PVC burden down to 3.4%. He has been stable generally.  He has depression/anxiety, he lost his best friend in 2020 to massive MI, stress r/t disability and finances. He is on zoloft 100 mg, buspar 10 mg TID which has helped, xanax 0.25 mg occasionally in the evenings,  no longer taking daily. Formerly on wellbutrin, was stopped due to  ? Seizure at [employer]. Feels fairly controlled.  BMI is Body mass index is 30.15 kg/m., he is working on diet and exercise.,  4-5 bottles a day of water . Down 9 pounds since last visit at our office in 12/2020. He is doing more exercise- cardio and weights Wt Readings from Last 3 Encounters:  04/06/21 241 lb 3.2 oz (109.4 kg)  02/26/21 246 lb 12.8 oz (111.9 kg)  02/12/21 253 lb 9.6 oz (115 kg)   His blood pressure has been controlled at home, today their BP is BP: 102/64 BP Readings from Last 3 Encounters:  04/06/21 102/64  02/26/21 130/80  02/12/21 124/90     He does workout, walking. He denies chest pain, shortness of breath, dizziness, edema, PND.   He is on cholesterol medication, crestor 40 mg daily, zetia 10 mg daily and denies myalgias. His cholesterol is at goal less than 70. The cholesterol last visit was:   Lab Results  Component Value Date   CHOL 143 12/28/2020   HDL 55 12/28/2020   LDLCALC 64 12/28/2020   TRIG 165 (H) 12/28/2020   CHOLHDL 2.6 12/28/2020    He has been working on diet and exercise for prediabetes, and denies increased appetite, nausea, paresthesia of the feet, polydipsia, polyuria, visual disturbances, vomiting and weight loss.  On Jardiance via cardiology for heart -  Doesn't have glucometer but would like one - sample given due to no insurance  Last A1C in the office was:  Lab Results  Component Value Date   HGBA1C 5.9 (H) 12/28/2020    Patient is on Vitamin D supplement.   Lab Results  Component Value Date   VD25OH 40 04/22/2013      Current Medications:   Current Outpatient Medications (Endocrine & Metabolic):    empagliflozin (JARDIANCE) 10 MG TABS tablet, Take 1 tablet (10 mg total) by mouth daily.  Current Outpatient Medications (Cardiovascular):    eplerenone (INSPRA) 50 MG tablet, Take 1 tablet by mouth once daily   ezetimibe (ZETIA) 10 MG tablet, Take 1 tablet (10 mg total) by mouth daily.   furosemide (LASIX) 20 MG tablet,  Take 1 tablet by mouth once daily   metoprolol succinate (TOPROL-XL) 100 MG 24 hr tablet, Take 1 tablet (100 mg total) by mouth 2 (two) times daily in the morning and at bedtime. Take with or immediately following a meal.   mexiletine (MEXITIL) 150 MG capsule, Take 2 capsules (300 mg total) by mouth 2 (two) times daily.   rosuvastatin (CRESTOR) 40 MG tablet, Take 1 tablet by mouth once daily   sacubitril-valsartan (ENTRESTO) 97-103 MG, Take 1 tablet by mouth 2 (two) times daily.   Current Outpatient Medications (Analgesics):    acetaminophen (TYLENOL) 500 MG tablet, Take 1,000 mg by mouth every 6 (six) hours as needed for mild pain or headache.   aspirin EC 81 MG tablet, Take 81 mg by mouth daily. Swallow whole.   Current Outpatient Medications (Other):    ALPRAZolam (XANAX) 0.5 MG tablet, TAKE 1/2-1 TABLET BY MOUTH AT BEDTIME ONLY IF NEEDED FOR SLEEP AND LIMIT TO 5 DAYS A WEEK TO AVOID ADDICTION AND DEMENTIA   busPIRone (BUSPAR) 10 MG tablet, TAKE 1 TABLET BY MOUTH THREE TIMES DAILY FOR CHRONIC ANXIETY AND MOOD   Cholecalciferol (VITAMIN D3) 125 MCG (5000 UT) TABS, Take 5,000 Units by mouth daily.    pantoprazole (PROTONIX) 40 MG tablet, Take 1 tablet by  mouth once daily   potassium chloride (KLOR-CON) 10 MEQ tablet, Take 1 tablet by mouth once daily   sertraline (ZOLOFT) 100 MG tablet, Take 1 tablet (100 mg total) by mouth daily.  Medical History:  Past Medical History:  Diagnosis Date   Anxiety    Aortic root aneurysm    Aortic stenosis    Ascending aortic aneurysm    CAD (coronary artery disease)    Chronic combined systolic and diastolic congestive heart failure (HCC)    Dilated aortic root (HCC)    Hypertension    Non-ischemic cardiomyopathy (HCC)    Nonischemic cardiomyopathy (HCC)    S/P aortic valve replacement with bioprosthetic valve 04/10/2018   29 mm Edwards Inspiris Resilia stented bovine pericardial tissue valve   S/P ascending aortic aneurysm repair 04/10/2018   26 mm  Hemashield Platinum supracoronary straight graft   SVT (supraventricular tachycardia) (HCC)    Viral myocarditis 2004   Vitamin D deficiency    Allergies No Known Allergies   SURGICAL HISTORY He  has a past surgical history that includes Hernia repair (Left, 2010); Orchiectomy (Left, 1982); Varicose vein surgery (Left, 05/1998); RIGHT HEART CATH AND CORONARY ANGIOGRAPHY (N/A, 03/28/2018); Multiple extractions with alveoloplasty (N/A, 04/05/2018); Ascending aortic root replacement (N/A, 04/10/2018); TEE without cardioversion (N/A, 04/10/2018); and Aortic valve replacement (N/A, 04/10/2018). FAMILY HISTORY His family history includes Alzheimer's disease in his mother; Breast cancer (age of onset: 46) in his mother; Cancer in his father; Diabetes in his daughter, mother, and son; Hypertension in his father, mother, and sister. SOCIAL HISTORY He  reports that he quit smoking about 3 years ago. His smoking use included cigarettes. He started smoking about 43 years ago. He has a 30.00 pack-year smoking history. He has never used smokeless tobacco. He reports current alcohol use of about 1.0 standard drink per week. He reports that he does not use drugs.   Review of Systems  Constitutional:  Positive for malaise/fatigue. Negative for weight loss.  HENT:  Negative for hearing loss and tinnitus.   Eyes:  Negative for blurred vision and double vision.  Respiratory:  Negative for cough, shortness of breath and wheezing.   Cardiovascular:  Negative for chest pain, palpitations, orthopnea, claudication and leg swelling.  Gastrointestinal:  Negative for abdominal pain, blood in stool, constipation, diarrhea, heartburn, melena, nausea and vomiting.  Genitourinary: Negative.   Musculoskeletal:  Negative for joint pain and myalgias.  Skin:  Negative for rash.  Neurological:  Negative for dizziness, tingling, sensory change, weakness and headaches.  Endo/Heme/Allergies:  Negative for polydipsia.   Psychiatric/Behavioral:  Negative for depression, substance abuse and suicidal ideas. The patient is nervous/anxious and has insomnia.   All other systems reviewed and are negative.  Physical Exam: BP 102/64    Pulse 77    Temp 97.9 F (36.6 C)    Wt 241 lb 3.2 oz (109.4 kg)    SpO2 94%    BMI 30.15 kg/m  Wt Readings from Last 3 Encounters:  04/06/21 241 lb 3.2 oz (109.4 kg)  02/26/21 246 lb 12.8 oz (111.9 kg)  02/12/21 253 lb 9.6 oz (115 kg)  Body mass index is 30.15 kg/m.  General Appearance: Well nourished, well dressed male in no acute distress. Eyes: PERRLA, EOMs, conjunctiva no swelling or erythema Sinuses: No Frontal/maxillary tenderness ENT/Mouth: Ext aud canals clear, TMs without erythema, bulging. No erythema, swelling, or exudate on post pharynx.  Tonsils not swollen or erythematous. Hearing normal.  Neck: Supple, thyroid normal.  Respiratory: Respiratory effort  normal, BS equal bilaterally without rales, rhonchi, wheezing or stridor.  Cardio: Regular rate and rhythm, no murmurs, rubs or gallops.  Brisk peripheral pulses without significant edmea;  large torturous varicose veins bilateral legs, non-tender.  Abdomen: Soft, + BS.  Non tender, no guarding, rebound, hernias, masses. Lymphatics: Non tender without lymphadenopathy.  Musculoskeletal: Full ROM, 5/5 strength, normal gait.  Skin: Warm, dry without rashes, lesions, ecchymosis.  Neuro: Cranial nerves intact. Normal muscle tone, no cerebellar symptoms.  Psych: Awake and oriented X 3, mildly anxious affect, Insight and Judgment appropriate.    Bergen Melle W Takisha Pelle 3:38 PM

## 2021-04-06 ENCOUNTER — Other Ambulatory Visit: Payer: Self-pay

## 2021-04-06 ENCOUNTER — Ambulatory Visit (INDEPENDENT_AMBULATORY_CARE_PROVIDER_SITE_OTHER): Payer: Medicare Other | Admitting: Nurse Practitioner

## 2021-04-06 ENCOUNTER — Encounter: Payer: Self-pay | Admitting: Nurse Practitioner

## 2021-04-06 VITALS — BP 102/64 | HR 77 | Temp 97.9°F | Wt 241.2 lb

## 2021-04-06 DIAGNOSIS — R7309 Other abnormal glucose: Secondary | ICD-10-CM

## 2021-04-06 DIAGNOSIS — I10 Essential (primary) hypertension: Secondary | ICD-10-CM

## 2021-04-06 DIAGNOSIS — I493 Ventricular premature depolarization: Secondary | ICD-10-CM

## 2021-04-06 DIAGNOSIS — E785 Hyperlipidemia, unspecified: Secondary | ICD-10-CM

## 2021-04-06 DIAGNOSIS — Z1329 Encounter for screening for other suspected endocrine disorder: Secondary | ICD-10-CM

## 2021-04-06 DIAGNOSIS — Z953 Presence of xenogenic heart valve: Secondary | ICD-10-CM

## 2021-04-06 DIAGNOSIS — I471 Supraventricular tachycardia: Secondary | ICD-10-CM

## 2021-04-06 DIAGNOSIS — E559 Vitamin D deficiency, unspecified: Secondary | ICD-10-CM

## 2021-04-06 DIAGNOSIS — F419 Anxiety disorder, unspecified: Secondary | ICD-10-CM

## 2021-04-06 DIAGNOSIS — I428 Other cardiomyopathies: Secondary | ICD-10-CM | POA: Diagnosis not present

## 2021-04-06 DIAGNOSIS — Z79899 Other long term (current) drug therapy: Secondary | ICD-10-CM

## 2021-04-06 DIAGNOSIS — Z87891 Personal history of nicotine dependence: Secondary | ICD-10-CM

## 2021-04-06 DIAGNOSIS — I5042 Chronic combined systolic (congestive) and diastolic (congestive) heart failure: Secondary | ICD-10-CM

## 2021-04-07 LAB — TSH: TSH: 1.96 mIU/L (ref 0.40–4.50)

## 2021-04-07 LAB — CBC WITH DIFFERENTIAL/PLATELET
Absolute Monocytes: 903 cells/uL (ref 200–950)
Basophils Absolute: 37 cells/uL (ref 0–200)
Basophils Relative: 0.5 %
Eosinophils Absolute: 141 cells/uL (ref 15–500)
Eosinophils Relative: 1.9 %
HCT: 51.5 % — ABNORMAL HIGH (ref 38.5–50.0)
Hemoglobin: 18 g/dL — ABNORMAL HIGH (ref 13.2–17.1)
Lymphs Abs: 1872 cells/uL (ref 850–3900)
MCH: 31.6 pg (ref 27.0–33.0)
MCHC: 35 g/dL (ref 32.0–36.0)
MCV: 90.4 fL (ref 80.0–100.0)
MPV: 10.9 fL (ref 7.5–12.5)
Monocytes Relative: 12.2 %
Neutro Abs: 4447 cells/uL (ref 1500–7800)
Neutrophils Relative %: 60.1 %
Platelets: 115 10*3/uL — ABNORMAL LOW (ref 140–400)
RBC: 5.7 10*6/uL (ref 4.20–5.80)
RDW: 12.9 % (ref 11.0–15.0)
Total Lymphocyte: 25.3 %
WBC: 7.4 10*3/uL (ref 3.8–10.8)

## 2021-04-07 LAB — COMPLETE METABOLIC PANEL WITH GFR
AG Ratio: 1.9 (calc) (ref 1.0–2.5)
ALT: 36 U/L (ref 9–46)
AST: 37 U/L — ABNORMAL HIGH (ref 10–35)
Albumin: 4.8 g/dL (ref 3.6–5.1)
Alkaline phosphatase (APISO): 56 U/L (ref 35–144)
BUN: 14 mg/dL (ref 7–25)
CO2: 23 mmol/L (ref 20–32)
Calcium: 10.3 mg/dL (ref 8.6–10.3)
Chloride: 98 mmol/L (ref 98–110)
Creat: 0.98 mg/dL (ref 0.70–1.35)
Globulin: 2.5 g/dL (calc) (ref 1.9–3.7)
Glucose, Bld: 81 mg/dL (ref 65–99)
Potassium: 4.6 mmol/L (ref 3.5–5.3)
Sodium: 133 mmol/L — ABNORMAL LOW (ref 135–146)
Total Bilirubin: 0.6 mg/dL (ref 0.2–1.2)
Total Protein: 7.3 g/dL (ref 6.1–8.1)
eGFR: 88 mL/min/{1.73_m2} (ref >=60)

## 2021-04-07 LAB — LIPID PANEL
Cholesterol: 151 mg/dL (ref <200)
HDL: 78 mg/dL (ref >=40)
LDL Cholesterol (Calc): 56 mg/dL (calc)
Non-HDL Cholesterol (Calc): 73 mg/dL (calc) (ref <130)
Total CHOL/HDL Ratio: 1.9 (calc) (ref <5.0)
Triglycerides: 91 mg/dL (ref <150)

## 2021-04-07 LAB — HEMOGLOBIN A1C
Hgb A1c MFr Bld: 5.5 % of total Hgb (ref <5.7)
Mean Plasma Glucose: 111 mg/dL
eAG (mmol/L): 6.2 mmol/L

## 2021-04-21 ENCOUNTER — Other Ambulatory Visit: Payer: Self-pay

## 2021-04-21 ENCOUNTER — Ambulatory Visit (AMBULATORY_SURGERY_CENTER): Payer: Medicare Other

## 2021-04-21 VITALS — Ht 75.0 in | Wt 233.0 lb

## 2021-04-21 DIAGNOSIS — Z1211 Encounter for screening for malignant neoplasm of colon: Secondary | ICD-10-CM

## 2021-04-21 MED ORDER — PEG 3350-KCL-NA BICARB-NACL 420 G PO SOLR: 4000.0000 mL | Freq: Once | ORAL | 0 refills | Status: AC

## 2021-04-21 NOTE — Progress Notes (Signed)
No egg or soy allergy known to patient  ?No issues known to pt with past sedation with any surgeries or procedures ?Patient denies ever being told they had issues or difficulty with intubation  ?No FH of Malignant Hyperthermia ?Pt is not on diet pills ?Pt is not on home 02  ?Pt is not on blood thinners  ?Pt denies issues with constipation  ?No A fib or A flutter ?Pt is fully vaccinated for Covid x 2; ?NO PA's for preps discussed with pt in PV today  ?Discussed with pt there will be an out-of-pocket cost for prep and that varies from $0 to 70 + dollars - pt verbalized understanding  ?Due to the COVID-19 pandemic we are asking patients to follow certain guidelines in PV and the [place]   ?Pt aware of COVID protocols and [facility] guidelines  ?PV completed over the phone. Pt verified name, DOB, address and insurance during PV today.  ?Pt mailed instruction packet with copy of consent form to read and not return, and instructions.  ?Pt encouraged to call with questions or issues.  ?If pt has My chart, procedure instructions sent via My Chart  ? ?

## 2021-04-27 ENCOUNTER — Other Ambulatory Visit (HOSPITAL_COMMUNITY): Payer: Self-pay

## 2021-05-07 ENCOUNTER — Encounter: Payer: Self-pay | Admitting: Gastroenterology

## 2021-05-12 ENCOUNTER — Encounter: Payer: Self-pay | Admitting: Gastroenterology

## 2021-05-12 ENCOUNTER — Ambulatory Visit (AMBULATORY_SURGERY_CENTER): Payer: Medicare Other | Admitting: Gastroenterology

## 2021-05-12 VITALS — BP 110/74 | HR 65 | Temp 96.8°F | Resp 18 | Ht 75.0 in | Wt 233.0 lb

## 2021-05-12 DIAGNOSIS — D122 Benign neoplasm of ascending colon: Secondary | ICD-10-CM | POA: Diagnosis not present

## 2021-05-12 DIAGNOSIS — Z1211 Encounter for screening for malignant neoplasm of colon: Secondary | ICD-10-CM | POA: Diagnosis not present

## 2021-05-12 DIAGNOSIS — E669 Obesity, unspecified: Secondary | ICD-10-CM | POA: Diagnosis not present

## 2021-05-12 DIAGNOSIS — K6389 Other specified diseases of intestine: Secondary | ICD-10-CM | POA: Diagnosis not present

## 2021-05-12 DIAGNOSIS — D123 Benign neoplasm of transverse colon: Secondary | ICD-10-CM | POA: Diagnosis not present

## 2021-05-12 MED ORDER — SODIUM CHLORIDE 0.9 % IV SOLN: 500.0000 mL | Freq: Once | INTRAVENOUS | Status: DC

## 2021-05-12 NOTE — Op Note (Signed)
[facility] ?Patient Name: Tyler Berger ?Procedure Date: 05/12/2021 9:11 AM ?MRN: [id number] ?Endoscopist: Samarie Pinder E. Candis Schatz , MD ?Age: 61 ?Referring MD:  ?Date of Birth: [birth year] ?Gender: Male ?Account #: [id number] ?Procedure:                Colonoscopy ?Indications:              Screening for colorectal malignant neoplasm, This  ?                          is the patient's first colonoscopy ?Medicines:                Monitored Anesthesia Care ?Procedure:                Pre-Anesthesia Assessment: ?                          - Prior to the procedure, a History and Physical  ?                          was performed, and patient medications and  ?                          allergies were reviewed. The patient's tolerance of  ?                          previous anesthesia was also reviewed. The risks  ?                          and benefits of the procedure and the sedation  ?                          options and risks were discussed with the patient.  ?                          All questions were answered, and informed consent  ?                          was obtained. Prior Anticoagulants: The patient has  ?                          taken no previous anticoagulant or antiplatelet  ?                          agents. ASA Grade Assessment: II - A patient with  ?                          mild systemic disease. After reviewing the risks  ?                          and benefits, the patient was deemed in  ?                          satisfactory condition to undergo the procedure. ?  After obtaining informed consent, the colonoscope  ?                          was passed under direct vision. Throughout the  ?                          procedure, the patient's blood pressure, pulse, and  ?                          oxygen saturations were monitored continuously. The  ?                          Colonoscope was introduced through the anus and  ?                          advanced to the the cecum,  identified by  ?                          appendiceal orifice and ileocecal valve. The  ?                          colonoscopy was somewhat difficult due to  ?                          significant looping. Successful completion of the  ?                          procedure was aided by using manual pressure. The  ?                          patient tolerated the procedure well. The quality  ?                          of the bowel preparation was adequate. The  ?                          ileocecal valve, appendiceal orifice, and rectum  ?                          were photographed. The bowel preparation used was  ?                          GoLYTELY via split dose instruction. ?Scope In: 9:19:51 AM ?Scope Out: 9:44:04 AM ?Scope Withdrawal Time: 0 hours 14 minutes 30 seconds  ?Total Procedure Duration: 0 hours 24 minutes 13 seconds  ?Findings:                 The perianal and digital rectal examinations were  ?                          normal. Pertinent negatives include normal  ?                          sphincter tone and no palpable rectal lesions. ?  A 5 mm polyp was found in the ascending colon. The  ?                          polyp was flat. The polyp was removed with a cold  ?                          snare. Resection and retrieval were complete.  ?                          Estimated blood loss was minimal. ?                          A 2 mm polyp was found in the splenic flexure. The  ?                          polyp was sessile. The polyp was removed with a  ?                          cold snare. Resection and retrieval were complete.  ?                          Estimated blood loss was minimal. ?                          The exam was otherwise normal throughout the  ?                          examined colon. ?                          The retroflexed view of the distal rectum and anal  ?                          verge was normal and showed no anal or rectal  ?                           abnormalities. ?Complications:            No immediate complications. ?Estimated Blood Loss:     Estimated blood loss was minimal. ?Impression:               - One 5 mm polyp in the ascending colon, removed  ?                          with a cold snare. Resected and retrieved. ?                          - One 2 mm polyp at the splenic flexure, removed  ?                          with a cold snare. Resected and retrieved. ?                          - The distal rectum  and anal verge are normal on  ?                          retroflexion view. ?Recommendation:           - Patient has a contact number available for  ?                          emergencies. The signs and symptoms of potential  ?                          delayed complications were discussed with the  ?                          patient. Return to normal activities tomorrow.  ?                          Written discharge instructions were provided to the  ?                          patient. ?                          - Resume previous diet. ?                          - Continue present medications. ?                          - Await pathology results. ?                          - Repeat colonoscopy (date not yet determined) for  ?                          surveillance based on pathology results. ?Kimie Pidcock E. Candis Schatz, MD ?05/12/2021 9:50:22 AM ?This report has been signed electronically. ?

## 2021-05-12 NOTE — Patient Instructions (Signed)
HANDOUTS PROVIDED ON: POLYPS ? ?The polyps removed today have been sent for pathology.  The results can take 1-3 weeks to receive.  When your next colonoscopy should occur will be based on the pathology results.   ? ?You may resume your previous diet and medication schedule. ? ?Thank you for allowing [place] to care for you today!!! ? ? ?YOU HAD AN ENDOSCOPIC PROCEDURE TODAY AT [facility]:   Refer to the procedure report that was given to you for any specific questions about what was found during the examination.  If the procedure report does not answer your questions, please call your gastroenterologist to clarify.  If you requested that your care partner not be given the details of your procedure findings, then the procedure report has been included in a sealed envelope for you to review at your convenience later. ? ?YOU SHOULD EXPECT: Some feelings of bloating in the abdomen. Passage of more gas than usual.  Walking can help get rid of the air that was put into your GI tract during the procedure and reduce the bloating. If you had a lower endoscopy (such as a colonoscopy or flexible sigmoidoscopy) you may notice spotting of blood in your stool or on the toilet paper. If you underwent a bowel prep for your procedure, you may not have a normal bowel movement for a few days. ? ?Please Note:  You might notice some irritation and congestion in your nose or some drainage.  This is from the oxygen used during your procedure.  There is no need for concern and it should clear up in a day or so. ? ?SYMPTOMS TO REPORT IMMEDIATELY: ? ?Following lower endoscopy (colonoscopy or flexible sigmoidoscopy): ? Excessive amounts of blood in the stool ? Significant tenderness or worsening of abdominal pains ? Swelling of the abdomen that is new, acute ? Fever of 100?F or higher ? ?For urgent or emergent issues, a gastroenterologist can be reached at any hour by calling [phone]. ?Do not use MyChart messaging for  urgent concerns.  ? ? ?DIET:  We do recommend a small meal at first, but then you may proceed to your regular diet.  Drink plenty of fluids but you should avoid alcoholic beverages for 24 hours. ? ?ACTIVITY:  You should plan to take it easy for the rest of today and you should NOT DRIVE or use heavy machinery until tomorrow (because of the sedation medicines used during the test).   ? ?FOLLOW UP: ?Our staff will call the number listed on your records Monday morning between 7:15 am and 8:15 am following your procedure to check on you and address any questions or concerns that you may have regarding the information given to you following your procedure. If we do not reach you, we will leave a message.  We will attempt to reach you two times.  During this call, we will ask if you have developed any symptoms of COVID 19. If you develop any symptoms (ie: fever, flu-like symptoms, shortness of breath, cough etc.) before then, please call [phone].  If you test positive for Covid 19 in the 2 weeks post procedure, please call and report this information to [place].   ? ?If any biopsies were taken you will be contacted by phone or by letter within the next 1-3 weeks.  Please call us at [phone] if you have not heard about the biopsies in 3 weeks.  ? ? ?SIGNATURES/CONFIDENTIALITY: ?You and/or your care partner have signed paperwork  which will be entered into your electronic medical record.  These signatures attest to the fact that that the information above on your After Visit Summary has been reviewed and is understood.  Full responsibility of the confidentiality of this discharge information lies with you and/or your care-partner. ? ?

## 2021-05-12 NOTE — Progress Notes (Signed)
[facility] History and Physical ? ? ?Primary Care Physician:  Unk Pinto, MD ? ? ?Reason for Procedure:   Colon cancer screening ? ?Plan:    Screening colonoscopy ? ? ? ? ?HPI: Tyler Berger is a 61 y.o. male undergoing initial average risk screening colonoscopy.  He has no family history of colon cancer and no chronic GI symptoms.  ? ? ?Past Medical History:  ?Diagnosis Date  ? Anxiety   ? on meds  ? 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 Aortic stenosis   ? Ascending aortic aneurysm ([place])   ? CAD (coronary artery disease)   ? Chronic combined systolic and diastolic congestive heart failure ([place])   ? on meds  ? Depression   ? on meds  ? Diabetes mellitus without complication ([place])   ? on meds  ? Dilated aortic root ([place])   ? Hyperlipidemia   ? on meds  ? Hypertension   ? on meds  ? Non-ischemic cardiomyopathy ([place])   ? Nonischemic cardiomyopathy ([place])   ? S/P aortic valve replacement with bioprosthetic valve 04/10/2018  ? 29 mm Edwards Inspiris Resilia stented bovine pericardial tissue valve  ? S/P ascending aortic aneurysm repair 04/10/2018  ? 26 mm Hemashield Platinum supracoronary straight graft  ? Seasonal allergies   ? SVT (supraventricular tachycardia) ([place])   ? Viral myocarditis 2004  ? Vitamin D deficiency   ? ? ?Past Surgical History:  ?Procedure Laterality Date  ? AORTIC VALVE REPLACEMENT N/A 04/10/2018  ? Procedure: Aortic Valve Replacement (Avr), USING INSPIRIS 29MM;  Surgeon: Rexene Alberts, MD;  Location: [facility];  Service: Open Heart Surgery;  Laterality: N/A;  ? ASCENDING AORTIC ROOT REPLACEMENT N/A 04/10/2018  ? Procedure: RESECTION AND GRAFTING OF ASCENDING AORTIC ROOT ANUERYSM (SUPRACORONARY);  Surgeon: Rexene Alberts, MD;  Location: [facility];  Service: Open Heart Surgery;  Laterality: N/A;  ? INGUINAL HERNIA REPAIR Left   ? MULTIPLE EXTRACTIONS WITH ALVEOLOPLASTY N/A 04/05/2018  ? Procedure: Extraction of tooth #'s 3, 12, 14,19, 23, 31 and 32 with alveoloplasty and gross debridement of  remaining teeth;  Surgeon: Lenn Cal, DDS;  Location: [facility];  Service: Oral Surgery;  Laterality: N/A;  ? ORCHIECTOMY Left 02/08/1980  ? RIGHT HEART CATH AND CORONARY ANGIOGRAPHY N/A 03/28/2018  ? Procedure: RIGHT HEART CATH AND CORONARY ANGIOGRAPHY;  Surgeon: Larey Dresser, MD;  Location: [facility];  Service: Cardiovascular;  Laterality: N/A;  ? TEE WITHOUT CARDIOVERSION N/A 04/10/2018  ? Procedure: TRANSESOPHAGEAL ECHOCARDIOGRAM (TEE);  Surgeon: Rexene Alberts, MD;  Location: [facility];  Service: Open Heart Surgery;  Laterality: N/A;  ? VARICOSE VEIN SURGERY Left 05/09/1998  ? ? ?Prior to Admission medications   ?Medication Sig Start Date End Date Taking? Authorizing Provider  ?acetaminophen (TYLENOL) 500 MG tablet Take 1,000 mg by mouth every 6 (six) hours as needed for mild pain or headache.   Yes [provider]  ?aspirin EC 81 MG tablet Take 81 mg by mouth daily. Swallow whole.   Yes [provider]  ?busPIRone (BUSPAR) 10 MG tablet TAKE 1 TABLET BY MOUTH THREE TIMES DAILY FOR CHRONIC ANXIETY AND MOOD 04/09/20  Yes Liane Comber, NP  ?cetirizine (ZYRTEC) 10 MG tablet Take 10 mg by mouth daily.   Yes [provider]  ?Cholecalciferol (VITAMIN D3) 125 MCG (5000 UT) TABS Take 5,000 Units by mouth daily.    Yes [provider]  ?empagliflozin (JARDIANCE) 10 MG TABS tablet Take 1 tablet (10  mg total) by mouth daily. 03/03/21  Yes Larey Dresser, MD  ?eplerenone (INSPRA) 50 MG tablet Take 1 tablet by mouth once daily 02/17/21  Yes Larey Dresser, MD  ?ezetimibe (ZETIA) 10 MG tablet Take 1 tablet (10 mg total) by mouth daily. 12/05/19 01/14/22 Yes Larey Dresser, MD  ?furosemide (LASIX) 20 MG tablet Take 1 tablet by mouth once daily 02/22/21  Yes Larey Dresser, MD  ?metoprolol succinate (TOPROL-XL) 100 MG 24 hr tablet Take 1 tablet (100 mg total) by mouth 2 (two) times daily in the morning and at bedtime. Take with or immediately following a meal. 01/05/21   Yes Larey Dresser, MD  ?mexiletine (MEXITIL) 150 MG capsule Take 2 capsules (300 mg total) by mouth 2 (two) times daily. 03/18/21  Yes Larey Dresser, MD  ?pantoprazole (PROTONIX) 40 MG tablet Take 1 tablet by mouth once daily 07/13/20  Yes Larey Dresser, MD  ?potassium chloride (KLOR-CON) 10 MEQ tablet Take 1 tablet by mouth once daily 05/22/20  Yes Larey Dresser, MD  ?rosuvastatin (CRESTOR) 40 MG tablet Take 1 tablet by mouth once daily 10/19/20  Yes Larey Dresser, MD  ?sacubitril-valsartan (ENTRESTO) 97-103 MG Take 1 tablet by mouth 2 (two) times daily. 10/08/20  Yes Larey Dresser, MD  ?sertraline (ZOLOFT) 100 MG tablet Take 1 tablet (100 mg total) by mouth daily. 02/18/20 02/26/22 Yes Liane Comber, NP  ?ALPRAZolam (XANAX) 0.5 MG tablet TAKE 1/2-1 TABLET BY MOUTH AT BEDTIME ONLY IF NEEDED FOR SLEEP AND LIMIT TO 5 DAYS A WEEK TO AVOID ADDICTION AND DEMENTIA 02/25/20   Unk Pinto, MD  ? ? ?Current Outpatient Medications  ?Medication Sig Dispense Refill  ? acetaminophen (TYLENOL) 500 MG tablet Take 1,000 mg by mouth every 6 (six) hours as needed for mild pain or headache.    ? aspirin EC 81 MG tablet Take 81 mg by mouth daily. Swallow whole.    ? busPIRone (BUSPAR) 10 MG tablet TAKE 1 TABLET BY MOUTH THREE TIMES DAILY FOR CHRONIC ANXIETY AND MOOD 270 tablet 3  ? cetirizine (ZYRTEC) 10 MG tablet Take 10 mg by mouth daily.    ? Cholecalciferol (VITAMIN D3) 125 MCG (5000 UT) TABS Take 5,000 Units by mouth daily.     ? empagliflozin (JARDIANCE) 10 MG TABS tablet Take 1 tablet (10 mg total) by mouth daily. 30 tablet 3  ? eplerenone (INSPRA) 50 MG tablet Take 1 tablet by mouth once daily 30 tablet 11  ? ezetimibe (ZETIA) 10 MG tablet Take 1 tablet (10 mg total) by mouth daily. 90 tablet 3  ? furosemide (LASIX) 20 MG tablet Take 1 tablet by mouth once daily 90 tablet 3  ? metoprolol succinate (TOPROL-XL) 100 MG 24 hr tablet Take 1 tablet (100 mg total) by mouth 2 (two) times daily in the morning and at  bedtime. Take with or immediately following a meal. 60 tablet 4  ? mexiletine (MEXITIL) 150 MG capsule Take 2 capsules (300 mg total) by mouth 2 (two) times daily. 120 capsule 5  ? pantoprazole (PROTONIX) 40 MG tablet Take 1 tablet by mouth once daily 30 tablet 11  ? potassium chloride (KLOR-CON) 10 MEQ tablet Take 1 tablet by mouth once daily 90 tablet 3  ? rosuvastatin (CRESTOR) 40 MG tablet Take 1 tablet by mouth once daily 90 tablet 2  ? sacubitril-valsartan (ENTRESTO) 97-103 MG Take 1 tablet by mouth 2 (two) times daily. 180 tablet 3  ? sertraline (ZOLOFT) 100 MG  tablet Take 1 tablet (100 mg total) by mouth daily. 90 tablet 3  ? ALPRAZolam (XANAX) 0.5 MG tablet TAKE 1/2-1 TABLET BY MOUTH AT BEDTIME ONLY IF NEEDED FOR SLEEP AND LIMIT TO 5 DAYS A WEEK TO AVOID ADDICTION AND DEMENTIA 30 tablet 0  ? ?Current Facility-Administered Medications  ?Medication Dose Route Frequency Provider Last Rate Last Admin  ? 0.9 %  sodium chloride infusion  500 mL Intravenous Once Daryel November, MD      ? ? ?Allergies as of 05/12/2021  ? (No Known Allergies)  ? ? ?Family History  ?Problem Relation Age of Onset  ? Diabetes Mother   ? Hypertension Mother   ? Alzheimer's disease Mother   ? Breast cancer Mother 61  ?     breast  ? Esophageal cancer Father 13  ? Colon polyps Father   ? Hypertension Father   ? Cancer Father   ?     laryngeal, former smoker  ? Hypertension Sister   ? Colon polyps Maternal Grandmother 72  ? Colon cancer Maternal Grandmother 45  ? Diabetes Daughter   ? Diabetes Son   ? Rectal cancer Neg Hx   ? Stomach cancer Neg Hx   ? ? ?Social History  ? ?Socioeconomic History  ? Marital status: Married  ?  Spouse name: Not on file  ? Number of children: 2  ? Years of education: Not on file  ? Highest education level: Not on file  ?Occupational History  ? 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 Smoking status: Former  ?  Packs/day: 0.75  ?  Years: 40.00  ?  Pack years: 30.00  ?  Types:  Cigarettes  ?  Start date: 02/07/1978  ?  Quit date: 03/28/2018  ?  Years since quitting: 3.1  ? Smokeless tobacco: Never  ?Vaping Use  ? Vaping Use: Never used  ?Substance and Sexual Activity  ? Alcohol use: Not

## 2021-05-12 NOTE — Progress Notes (Signed)
Called to room to assist during endoscopic procedure.  Patient ID and intended procedure confirmed with present staff. Received instructions for my participation in the procedure from the performing physician.  

## 2021-05-12 NOTE — Progress Notes (Signed)
Report to PACU, RN, vss, BBS= Clear.  

## 2021-05-13 ENCOUNTER — Ambulatory Visit: Payer: Medicare Other | Admitting: Physician Assistant

## 2021-05-17 ENCOUNTER — Telehealth: Payer: Self-pay | Admitting: *Deleted

## 2021-05-17 NOTE — Telephone Encounter (Signed)
?  Follow up Call- ? ? ?  05/12/2021  ?  8:34 AM  ?Call back number  ?Post procedure Call Back phone  # [phone]  ?Permission to leave phone message Yes  ?  ? ?Patient questions: ? ?Do you have a fever, pain , or abdominal swelling? No. ?Pain Score  0 * ? ?Have you tolerated food without any problems? Yes.   ? ?Have you been able to return to your normal activities? Yes.   ? ?Do you have any questions about your discharge instructions: ?Diet   No. ?Medications  No. ?Follow up visit  No. ? ?Do you have questions or concerns about your Care? No. ? ?Actions: ?* If pain score is 4 or above: ?No action needed, pain <4. ? ? ?

## 2021-05-20 NOTE — Progress Notes (Signed)
Mr. Wrage, ?One polyp which I removed during your recent procedure was proven to be completely benign but is considered a "pre-cancerous" polyp that MAY have grown into cancer if it had not been removed.  Studies shows that at least 20% of women over age 62 and 30% of men over age 78 have pre-cancerous polyps.  Based on current nationally recognized surveillance guidelines, I recommend that you have a repeat colonoscopy in 7 years.  ? ?If you develop any new rectal bleeding, abdominal pain or significant bowel habit changes, please contact me before then. ? ?

## 2021-05-24 ENCOUNTER — Other Ambulatory Visit: Payer: Self-pay | Admitting: Adult Health

## 2021-05-28 ENCOUNTER — Other Ambulatory Visit (HOSPITAL_COMMUNITY): Payer: Self-pay

## 2021-06-06 NOTE — Progress Notes (Deleted)
Cardiology Office Note Date:  06/06/2021  Patient ID:  Tyler Berger, Tyler Berger 11-18-1960, MRN [id number] PCP:  Unk Pinto, MD  Cardiologist:  Dr. Aundra Dubin Electrophysiologist: Dr. Curt Bears  ***refresh   Chief Complaint: *** 3 mo  History of Present Illness: Tyler Berger is a 61 y.o. male with history of NICM, SVT, PVCs, HTN, mild CAD, Ascending aneurysm/VHD w/AS s/p aneurysm repair and bioprosthetic AVR (2020), prior ETOH/tobacco abuse, LBBB  She comes in today to be seen for Dr. Curt Bears, seen by him last on Jan 2023, with a recent monitor with an up tick in PVC burden to 9.4%  HISTORIALLY PVC burden had been 15% and placed on amiodarone to a burden of 3.4%.  Though given young age changed to mexiletine.   At this visit with a slight increase in burden mexiletine was increased.  He saw Dr. Aundra Dubin 02/26/21, LVEF had recovered by his last echo oct 2021 and remained 55-60%by his echo done the of his visit that day, no changes were made.  *** symptoms, PVCs, mex   AAD Hx (PVCs) Amiodarone started post op March 2020 for NSVTs, stopped Feb 2021 Mexiletine started Feb 2021   Past Medical History:  Diagnosis Date   Anxiety    on meds   Aortic root aneurysm [facility])    Aortic stenosis    Ascending aortic aneurysm (HCC)    CAD (coronary artery disease)    Chronic combined systolic and diastolic congestive heart failure (HCC)    on meds   Depression    on meds   Diabetes mellitus without complication ([place])    on meds   Dilated aortic root (HCC)    Hyperlipidemia    on meds   Hypertension    on meds   Non-ischemic cardiomyopathy ([place])    Nonischemic cardiomyopathy ([place])    S/P aortic valve replacement with bioprosthetic valve 04/10/2018   29 mm Edwards Inspiris Resilia stented bovine pericardial tissue valve   S/P ascending aortic aneurysm repair 04/10/2018   26 mm Hemashield Platinum supracoronary straight graft   Seasonal allergies    SVT (supraventricular tachycardia) (HCC)     Viral myocarditis 2004   Vitamin D deficiency     Past Surgical History:  Procedure Laterality Date   AORTIC VALVE REPLACEMENT N/A 04/10/2018   Procedure: Aortic Valve Replacement (Avr), USING INSPIRIS 29MM;  Surgeon: Rexene Alberts, MD;  Location: [place];  Service: Open Heart Surgery;  Laterality: N/A;   ASCENDING AORTIC ROOT REPLACEMENT N/A 04/10/2018   Procedure: RESECTION AND GRAFTING OF ASCENDING AORTIC ROOT ANUERYSM (SUPRACORONARY);  Surgeon: Rexene Alberts, MD;  Location: [place];  Service: Open Heart Surgery;  Laterality: N/A;   INGUINAL HERNIA REPAIR Left    MULTIPLE EXTRACTIONS WITH ALVEOLOPLASTY N/A 04/05/2018   Procedure: Extraction of tooth #'s 3, 12, 14,19, 23, 31 and 32 with alveoloplasty and gross debridement of remaining teeth;  Surgeon: Lenn Cal, DDS;  Location: [place];  Service: Oral Surgery;  Laterality: N/A;   ORCHIECTOMY Left 02/08/1980   RIGHT HEART CATH AND CORONARY ANGIOGRAPHY N/A 03/28/2018   Procedure: RIGHT HEART CATH AND CORONARY ANGIOGRAPHY;  Surgeon: Larey Dresser, MD;  Location: [place] CV LAB;  Service: Cardiovascular;  Laterality: N/A;   TEE WITHOUT CARDIOVERSION N/A 04/10/2018   Procedure: TRANSESOPHAGEAL ECHOCARDIOGRAM (TEE);  Surgeon: Rexene Alberts, MD;  Location: [place];  Service: Open Heart Surgery;  Laterality: N/A;   VARICOSE VEIN SURGERY Left 05/09/1998    Current Outpatient Medications  Medication Sig Dispense Refill   acetaminophen (TYLENOL) 500 MG tablet Take 1,000 mg by mouth every 6 (six) hours as needed for mild pain or headache.     ALPRAZolam (XANAX) 0.5 MG tablet TAKE 1/2-1 TABLET BY MOUTH AT BEDTIME ONLY IF NEEDED FOR SLEEP AND LIMIT TO 5 DAYS A WEEK TO AVOID ADDICTION AND DEMENTIA 30 tablet 0   aspirin EC 81 MG tablet Take 81 mg by mouth daily. Swallow whole.     busPIRone (BUSPAR) 10 MG tablet TAKE 1 TABLET BY MOUTH THREE TIMES DAILY FOR CHRONIC ANXIETY AND MOOD 270 tablet 3   cetirizine (ZYRTEC) 10 MG tablet Take  10 mg by mouth daily.     Cholecalciferol (VITAMIN D3) 125 MCG (5000 UT) TABS Take 5,000 Units by mouth daily.      empagliflozin (JARDIANCE) 10 MG TABS tablet Take 1 tablet (10 mg total) by mouth daily. 30 tablet 3   eplerenone (INSPRA) 50 MG tablet Take 1 tablet by mouth once daily 30 tablet 11   ezetimibe (ZETIA) 10 MG tablet Take 1 tablet (10 mg total) by mouth daily. 90 tablet 3   furosemide (LASIX) 20 MG tablet Take 1 tablet by mouth once daily 90 tablet 3   metoprolol succinate (TOPROL-XL) 100 MG 24 hr tablet Take 1 tablet (100 mg total) by mouth 2 (two) times daily in the morning and at bedtime. Take with or immediately following a meal. 60 tablet 4   mexiletine (MEXITIL) 150 MG capsule Take 2 capsules (300 mg total) by mouth 2 (two) times daily. 120 capsule 5   pantoprazole (PROTONIX) 40 MG tablet Take 1 tablet by mouth once daily 30 tablet 11   potassium chloride (KLOR-CON) 10 MEQ tablet Take 1 tablet by mouth once daily 90 tablet 3   rosuvastatin (CRESTOR) 40 MG tablet Take 1 tablet by mouth once daily 90 tablet 2   sacubitril-valsartan (ENTRESTO) 97-103 MG Take 1 tablet by mouth 2 (two) times daily. 180 tablet 3   sertraline (ZOLOFT) 100 MG tablet Take 1 tablet by mouth once daily 90 tablet 0   No current facility-administered medications for this visit.    Allergies:   Patient has no known allergies.   Social History:  The patient  reports that he quit smoking about 3 years ago. His smoking use included cigarettes. He started smoking about 43 years ago. He has a 30.00 pack-year smoking history. He has never used smokeless tobacco. He reports that he does not currently use alcohol after a past usage of about 8.0 standard drinks per week. He reports that he does not use drugs.   Family History:  The patient's family history includes Alzheimer's disease in his mother; Breast cancer (age of onset: 64) in his mother; Cancer in his father; Colon cancer (age of onset: 54) in his maternal  grandmother; Colon polyps in his father; Colon polyps (age of onset: 38) in his maternal grandmother; Diabetes in his daughter, mother, and son; Esophageal cancer (age of onset: 5) in his father; Hypertension in his father, mother, and sister.  ROS:  Please see the history of present illness.    All other systems are reviewed and otherwise negative.   PHYSICAL EXAM:  VS:  There were no vitals taken for this visit. BMI: There is no height or weight on file to calculate BMI. Well nourished, well developed, in no acute distress HEENT: normocephalic, atraumatic Neck: no JVD, carotid bruits or masses Cardiac:  *** RRR; no significant murmurs, no rubs,  or gallops Lungs:  *** CTA b/l, no wheezing, rhonchi or rales Abd: soft, nontender MS: no deformity or *** atrophy Ext: *** no edema Skin: warm and dry, no rash Neuro:  No gross deficits appreciated Psych: euthymic mood, full affect    EKG:  Done today and reviewed by myself shows  ***   02/26/2021: TTE 1. Left ventricular ejection fraction, by estimation, is 55 to 60%. The  left ventricle has normal function. The left ventricle has no regional  wall motion abnormalities. Left ventricular diastolic parameters are  consistent with Grade I diastolic  dysfunction (impaired relaxation).   2. Right ventricular systolic function is normal. The right ventricular  size is normal. Tricuspid regurgitation signal is inadequate for assessing  PA pressure.   3. Bioprosthetic aortic valve mean gradient 12 mmHg. No significant  stenosis or regurgitation.   4. The inferior vena cava is normal in size with greater than 50%  respiratory variability, suggesting right atrial pressure of 3 mmHg.   5. The mitral valve is normal in structure. No evidence of mitral valve  regurgitation. No evidence of mitral stenosis.   6. Technically difficult study with poor acoustic windows. T    03/28/2018: R/LHC 1. Markedly elevated PCWP.  2. Decreased cardiac  output.  3. 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  4. Nonobstructive CAD.    Nonischemic cardiomyopathy.  Reviewed initial echo, looks like severe aortic stenosis.  With marked PCWP elevation, did not cross valve and plan to admit, diurese, and proceed with careful limited echo for AS to confirm severity (possibly TEE if needed).    Recent Labs: 02/26/2021: Magnesium 2.2 04/06/2021: ALT 36; BUN 14; Creat 0.98; Hemoglobin 18.0; Platelets 115; Potassium 4.6; Sodium 133; TSH 1.96  04/06/2021: Cholesterol 151; HDL 78; LDL Cholesterol (Calc) 56; Total CHOL/HDL Ratio 1.9; Triglycerides 91   CrCl cannot be calculated (Patient's most recent lab result is older than the maximum 21 days allowed.).   Wt Readings from Last 3 Encounters:  05/12/21 233 lb (105.7 kg)  04/21/21 233 lb (105.7 kg)  04/06/21 241 lb 3.2 oz (109.4 kg)     Other studies reviewed: Additional studies/records reviewed today include: summarized above  ASSESSMENT AND PLAN:  NSVT/PVCs On mexiletine ***  NICM Recovered LVEF ***   Aortic aneurysm/VHD S/p repair and AVR (bioprosthetic) Valve working well on his recent cho ***   HTN ***   Disposition: F/u with ***  Current medicines are reviewed at length with the patient today.  The patient did not have any concerns regarding medicines.  Venetia Night, PA-C 06/06/2021 1:11 PM     [place] [address] [phone] (office)  [phone] (fax)

## 2021-06-09 ENCOUNTER — Ambulatory Visit: Payer: Medicare Other | Admitting: Physician Assistant

## 2021-06-25 ENCOUNTER — Ambulatory Visit: Payer: Medicare Other | Admitting: Physician Assistant

## 2021-06-25 ENCOUNTER — Other Ambulatory Visit: Payer: Self-pay | Admitting: Nurse Practitioner

## 2021-06-28 ENCOUNTER — Other Ambulatory Visit (HOSPITAL_COMMUNITY): Payer: Self-pay | Admitting: Cardiology

## 2021-06-28 ENCOUNTER — Other Ambulatory Visit (HOSPITAL_COMMUNITY): Payer: Self-pay

## 2021-06-28 MED ORDER — EMPAGLIFLOZIN 10 MG PO TABS
10.0000 mg | ORAL_TABLET | Freq: Every day | ORAL | 3 refills | Status: DC
  Filled 2021-06-28: qty 30, 30d supply, fill #0
  Filled 2021-08-02 – 2021-08-05 (×2): qty 30, 30d supply, fill #1
  Filled 2021-08-30: qty 30, 30d supply, fill #2
  Filled 2021-09-27: qty 30, 30d supply, fill #3

## 2021-06-28 MED ORDER — METOPROLOL SUCCINATE ER 100 MG PO TB24
100.0000 mg | ORAL_TABLET | Freq: Two times a day (BID) | ORAL | 3 refills | Status: DC
  Filled 2021-06-28: qty 90, 45d supply, fill #0
  Filled 2021-06-28: qty 180, 90d supply, fill #0
  Filled 2021-09-27: qty 180, 90d supply, fill #1
  Filled 2021-12-27 (×2): qty 180, 90d supply, fill #2
  Filled 2022-03-28: qty 180, 90d supply, fill #3

## 2021-06-30 ENCOUNTER — Other Ambulatory Visit (HOSPITAL_COMMUNITY): Payer: Self-pay

## 2021-06-30 MED ORDER — SACUBITRIL-VALSARTAN 97-103 MG PO TABS: 1.0000 | ORAL_TABLET | Freq: Two times a day (BID) | ORAL | 2 refills | Status: DC

## 2021-07-07 NOTE — Progress Notes (Unsigned)
Cardiology Office Note Date:  07/07/2021  Patient ID:  Tyler Berger 1961/01/19, MRN [id number] PCP:  Unk Pinto, MD  Cardiologist:  Dr. Aundra Dubin Electrophysiologist: Dr. Curt Bears  ***refresh   Chief Complaint: *** 3 mo  History of Present Illness: Tyler Berger is a 61 y.o. male with history of NICM, SVT, PVCs, HTN, mild CAD, Ascending aneurysm/VHD w/AS s/p aneurysm repair and bioprosthetic AVR (2020), prior ETOH/tobacco abuse, LBBB  She comes in today to be seen for Dr. Curt Bears, seen by him last on Jan 2023, with a recent monitor with an up tick in PVC burden to 9.4%  HISTORIALLY PVC burden had been 15% and placed on amiodarone to a burden of 3.4%.  Though given young age changed to mexiletine.   At this visit with a slight increase in burden mexiletine was increased.  He saw Dr. Aundra Dubin 02/26/21, LVEF had recovered by his last echo oct 2021 and remained 55-60%by his echo done the of his visit that day, no changes were made.  *** symptoms, PVCs, mex   AAD Hx (PVCs) Amiodarone started post op March 2020 for NSVTs, stopped Feb 2021 Mexiletine started Feb 2021   Past Medical History:  Diagnosis Date   Anxiety    on meds   Aortic root aneurysm [facility])    Aortic stenosis    Ascending aortic aneurysm (HCC)    CAD (coronary artery disease)    Chronic combined systolic and diastolic congestive heart failure (HCC)    on meds   Depression    on meds   Diabetes mellitus without complication ([place])    on meds   Dilated aortic root (HCC)    Hyperlipidemia    on meds   Hypertension    on meds   Non-ischemic cardiomyopathy ([place])    Nonischemic cardiomyopathy ([place])    S/P aortic valve replacement with bioprosthetic valve 04/10/2018   29 mm Edwards Inspiris Resilia stented bovine pericardial tissue valve   S/P ascending aortic aneurysm repair 04/10/2018   26 mm Hemashield Platinum supracoronary straight graft   Seasonal allergies    SVT (supraventricular tachycardia) (HCC)     Viral myocarditis 2004   Vitamin D deficiency     Past Surgical History:  Procedure Laterality Date   AORTIC VALVE REPLACEMENT N/A 04/10/2018   Procedure: Aortic Valve Replacement (Avr), USING INSPIRIS 29MM;  Surgeon: Rexene Alberts, MD;  Location: [place];  Service: Open Heart Surgery;  Laterality: N/A;   ASCENDING AORTIC ROOT REPLACEMENT N/A 04/10/2018   Procedure: RESECTION AND GRAFTING OF ASCENDING AORTIC ROOT ANUERYSM (SUPRACORONARY);  Surgeon: Rexene Alberts, MD;  Location: [place];  Service: Open Heart Surgery;  Laterality: N/A;   INGUINAL HERNIA REPAIR Left    MULTIPLE EXTRACTIONS WITH ALVEOLOPLASTY N/A 04/05/2018   Procedure: Extraction of tooth #'s 3, 12, 14,19, 23, 31 and 32 with alveoloplasty and gross debridement of remaining teeth;  Surgeon: Lenn Cal, DDS;  Location: [place];  Service: Oral Surgery;  Laterality: N/A;   ORCHIECTOMY Left 02/08/1980   RIGHT HEART CATH AND CORONARY ANGIOGRAPHY N/A 03/28/2018   Procedure: RIGHT HEART CATH AND CORONARY ANGIOGRAPHY;  Surgeon: Larey Dresser, MD;  Location: [place] CV LAB;  Service: Cardiovascular;  Laterality: N/A;   TEE WITHOUT CARDIOVERSION N/A 04/10/2018   Procedure: TRANSESOPHAGEAL ECHOCARDIOGRAM (TEE);  Surgeon: Rexene Alberts, MD;  Location: [place];  Service: Open Heart Surgery;  Laterality: N/A;   VARICOSE VEIN SURGERY Left 05/09/1998    Current Outpatient Medications  Medication Sig Dispense Refill   acetaminophen (TYLENOL) 500 MG tablet Take 1,000 mg by mouth every 6 (six) hours as needed for mild pain or headache.     ALPRAZolam (XANAX) 0.5 MG tablet TAKE 1/2-1 TABLET BY MOUTH AT BEDTIME ONLY IF NEEDED FOR SLEEP AND LIMIT TO 5 DAYS A WEEK TO AVOID ADDICTION AND DEMENTIA 30 tablet 0   aspirin EC 81 MG tablet Take 81 mg by mouth daily. Swallow whole.     busPIRone (BUSPAR) 10 MG tablet TAKE 1 TABLET BY MOUTH THREE TIMES DAILY FOR CHRONIC ANXIETY AND MOOD 270 tablet 3   cetirizine (ZYRTEC) 10 MG tablet Take  10 mg by mouth daily.     Cholecalciferol (VITAMIN D3) 125 MCG (5000 UT) TABS Take 5,000 Units by mouth daily.      empagliflozin (JARDIANCE) 10 MG TABS tablet Take 1 tablet (10 mg total) by mouth daily. 30 tablet 3   eplerenone (INSPRA) 50 MG tablet Take 1 tablet by mouth once daily 30 tablet 11   ezetimibe (ZETIA) 10 MG tablet Take 1 tablet (10 mg total) by mouth daily. 90 tablet 3   furosemide (LASIX) 20 MG tablet Take 1 tablet by mouth once daily 90 tablet 3   metoprolol succinate (TOPROL-XL) 100 MG 24 hr tablet Take 1 tablet (100 mg total) by mouth 2 (two) times daily in the morning and at bedtime. Take with or immediately following a meal. 180 tablet 3   mexiletine (MEXITIL) 150 MG capsule Take 2 capsules (300 mg total) by mouth 2 (two) times daily. 120 capsule 5   pantoprazole (PROTONIX) 40 MG tablet Take 1 tablet by mouth once daily 30 tablet 11   potassium chloride (KLOR-CON) 10 MEQ tablet Take 1 tablet by mouth once daily 90 tablet 3   rosuvastatin (CRESTOR) 40 MG tablet Take 1 tablet by mouth once daily 90 tablet 2   sacubitril-valsartan (ENTRESTO) 97-103 MG Take 1 tablet by mouth 2 (two) times daily. 180 tablet 2   sertraline (ZOLOFT) 100 MG tablet Take 1 tablet by mouth once daily 90 tablet 1   No current facility-administered medications for this visit.    Allergies:   Patient has no known allergies.   Social History:  The patient  reports that he quit smoking about 3 years ago. His smoking use included cigarettes. He started smoking about 43 years ago. He has a 30.00 pack-year smoking history. He has never used smokeless tobacco. He reports that he does not currently use alcohol after a past usage of about 8.0 standard drinks per week. He reports that he does not use drugs.   Family History:  The patient's family history includes Alzheimer's disease in his mother; Breast cancer (age of onset: 20) in his mother; Cancer in his father; Colon cancer (age of onset: 32) in his maternal  grandmother; Colon polyps in his father; Colon polyps (age of onset: 30) in his maternal grandmother; Diabetes in his daughter, mother, and son; Esophageal cancer (age of onset: 40) in his father; Hypertension in his father, mother, and sister.  ROS:  Please see the history of present illness.    All other systems are reviewed and otherwise negative.   PHYSICAL EXAM:  VS:  There were no vitals taken for this visit. BMI: There is no height or weight on file to calculate BMI. Well nourished, well developed, in no acute distress HEENT: normocephalic, atraumatic Neck: no JVD, carotid bruits or masses Cardiac:  *** RRR; no significant murmurs, no rubs,  or gallops Lungs:  *** CTA b/l, no wheezing, rhonchi or rales Abd: soft, nontender MS: no deformity or *** atrophy Ext: *** no edema Skin: warm and dry, no rash Neuro:  No gross deficits appreciated Psych: euthymic mood, full affect    EKG:  Done today and reviewed by myself shows  ***   02/26/2021: TTE 1. Left ventricular ejection fraction, by estimation, is 55 to 60%. The  left ventricle has normal function. The left ventricle has no regional  wall motion abnormalities. Left ventricular diastolic parameters are  consistent with Grade I diastolic  dysfunction (impaired relaxation).   2. Right ventricular systolic function is normal. The right ventricular  size is normal. Tricuspid regurgitation signal is inadequate for assessing  PA pressure.   3. Bioprosthetic aortic valve mean gradient 12 mmHg. No significant  stenosis or regurgitation.   4. The inferior vena cava is normal in size with greater than 50%  respiratory variability, suggesting right atrial pressure of 3 mmHg.   5. The mitral valve is normal in structure. No evidence of mitral valve  regurgitation. No evidence of mitral stenosis.   6. Technically difficult study with poor acoustic windows. T    03/28/2018: R/LHC 1. Markedly elevated PCWP.  2. Decreased cardiac  output.  3. 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  4. Nonobstructive CAD.    Nonischemic cardiomyopathy.  Reviewed initial echo, looks like severe aortic stenosis.  With marked PCWP elevation, did not cross valve and plan to admit, diurese, and proceed with careful limited echo for AS to confirm severity (possibly TEE if needed).    Recent Labs: 02/26/2021: Magnesium 2.2 04/06/2021: ALT 36; BUN 14; Creat 0.98; Hemoglobin 18.0; Platelets 115; Potassium 4.6; Sodium 133; TSH 1.96  04/06/2021: Cholesterol 151; HDL 78; LDL Cholesterol (Calc) 56; Total CHOL/HDL Ratio 1.9; Triglycerides 91   CrCl cannot be calculated (Patient's most recent lab result is older than the maximum 21 days allowed.).   Wt Readings from Last 3 Encounters:  05/12/21 233 lb (105.7 kg)  04/21/21 233 lb (105.7 kg)  04/06/21 241 lb 3.2 oz (109.4 kg)     Other studies reviewed: Additional studies/records reviewed today include: summarized above  ASSESSMENT AND PLAN:  NSVT/PVCs On mexiletine ***  NICM Recovered LVEF ***   Aortic aneurysm/VHD S/p repair and AVR (bioprosthetic) Valve working well on his recent cho ***   HTN ***   Disposition: F/u with ***  Current medicines are reviewed at length with the patient today.  The patient did not have any concerns regarding medicines.  Venetia Night, PA-C 07/07/2021 10:04 AM     [place]  [place] [address] [phone] (office)  [phone] (fax)

## 2021-07-08 ENCOUNTER — Ambulatory Visit: Payer: Medicare Other | Admitting: Physician Assistant

## 2021-07-08 ENCOUNTER — Encounter: Payer: Self-pay | Admitting: Physician Assistant

## 2021-07-08 VITALS — BP 106/62 | HR 77 | Ht 75.0 in | Wt 233.4 lb

## 2021-07-08 DIAGNOSIS — I428 Other cardiomyopathies: Secondary | ICD-10-CM

## 2021-07-08 DIAGNOSIS — I493 Ventricular premature depolarization: Secondary | ICD-10-CM | POA: Diagnosis not present

## 2021-07-08 DIAGNOSIS — I10 Essential (primary) hypertension: Secondary | ICD-10-CM | POA: Diagnosis not present

## 2021-07-08 DIAGNOSIS — I4729 Other ventricular tachycardia: Secondary | ICD-10-CM | POA: Diagnosis not present

## 2021-07-08 NOTE — Patient Instructions (Signed)
Medication Instructions:   Your physician recommends that you continue on your current medications as directed. Please refer to the Current Medication list given to you today.  *If you need a refill on your cardiac medications before your next appointment, please call your pharmacy*   Lab Work: [place]   If you have labs (blood work) drawn today and your tests are completely normal, you will receive your results only by: [place] (if you have MyChart) OR A paper copy in the mail If you have any lab test that is abnormal or we need to change your treatment, we will call you to review the results.   Testing/Procedures: NONE ORDERED  TODAY    Follow-Up: At [facility], you and your health needs are our priority.  As part of our continuing mission to provide you with exceptional heart care, we have created designated Provider Care Teams.  These Care Teams include your primary Cardiologist (physician) and Advanced Practice Providers (APPs -  Physician Assistants and Nurse Practitioners) who all work together to provide you with the care you need, when you need it.  We recommend signing up for the patient portal called "MyChart".  Sign up information is provided on this After Visit Summary.  MyChart is used to connect with patients for Virtual Visits (Telemedicine).  Patients are able to view lab/test results, encounter notes, upcoming appointments, etc.  Non-urgent messages can be sent to your provider as well.   To learn more about what you can do with MyChart, go to [email or web address].    Your next appointment:   1 year(s)  The format for your next appointment:   In Person  Provider:   You may see Will Meredith Leeds, MD or one of the following Advanced Practice Providers on your designated Care Team:   Tommye Standard, [place] Legrand Como "Jonni Sanger" Chalmers Cater, [place]    Other Instructions   Important Information About Sugar

## 2021-07-10 ENCOUNTER — Other Ambulatory Visit: Payer: Self-pay | Admitting: Adult Health

## 2021-07-10 DIAGNOSIS — F419 Anxiety disorder, unspecified: Secondary | ICD-10-CM

## 2021-07-12 NOTE — Progress Notes (Deleted)
Patient ID: Tyler Berger, male   DOB: [birth year], 61 y.o.   MRN: [id number]  3 MONTH FOLLOW UP  Assessment and Plan:   Essential hypertension Continue medications Monitor blood pressure at home; call if consistently over 130/80 Continue DASH diet.   Reminder to go to the ER if any CP, SOB, nausea, dizziness, severe HA, changes vision/speech, left arm numbness and tingling and jaw pain. - CBC  Chronic combined systolic and diastolic congestive heart failure [facility]) Cardiology managing Continue to maximize medications/blood pressure Excellent progress with weight loss commended- continue Weight daily and bring log of weight and BP to heart failure clinic  Non-ischemic cardiomyopathy ([place]) Try to maximize medications/blood pressure Excellent progress with weight loss - commended,  WEIGH DAILY and bring log of weight and BP to heart failure clinic  SVT (supraventricular tachycardia) (HCC) NSR at this time, remains controlled with meds; cardiology following   PVC (premature ventricular contraction) Continue on mexiletine , metoprolol Cardiology following  S/P aortic valve replacement with bioprosthetic valve + resection ascending thoracic aortic aneurysm Continue cardiology follow up  Hyperlipidemia, unspecified hyperlipidemia type Continue rosuvastatin; defer lipids to cardiology who has been checking, no insurance, has upcoming planning decrease fatty foods increase activity.  - CMP - Lipid panel   Abnormal glucose (prediabetes) Discussed disease and risks; on jardiance for heart/sugars  Discussed diet/exercise, weight management  Given glucometer sample and strips, can check PRN fasting  Fasting has been well controlled Weight down 32 lb today from last with carbohydrate reduction Check CMP  Anxiety Continue zoloft, buspar and xanax AS needed Has done well reducing xanax use Financial stress significant - pending disability  Stress management techniques discussed,  increase water, good sleep hygiene discussed, increase exercise, and increase veggies.   Vitamin D deficiency Continue supplement   Former smoker Quit smoking 2 years ago, congratulated the patient and encouraged him to continue.  30 pack year; Will schedule at next visit, has upcoming colonoscopy he wants to get through first  Medication Management Magnesium  Continue diet and meds as discussed. Further disposition pending results of labs. Future Appointments  Date Time Provider [place]  07/13/2021  2:30 PM Magda Bernheim, NP [facility] None  12/28/2021  3:00 PM Magda Bernheim, NP [facility] None    HPI 61 y.o. male  presents for 6 month follow up on CHF, nonischemic cardiomyopathy, htn, hyperlipidemia, prediabetes, obesity, vit D def.   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 Currently on medicare   Patient is s/p aortic valve replacement with biosynthetic valve and thoracic aortic aneurysm repair on 04/10/2018 by Dr. Roxy Manns, has systolic heart failure with post op TEE showing EF 35-40%. He was placed on amiodarone due to PVCs with improvement, then transitioned to mexiletine, toprol, entresto. Following with Dr. Aundra Dubin at the heart failure clinic. Echo 11/2019 showed EF up to 50-55%, normally functioning valve, grade 1 diastolic dysfunction. Mod nonobstructive CAD per 03/2018 cath. Zio patch in 4/21 showed PVC burden down to 3.4%. He has been stable generally.  He has depression/anxiety, he lost his best friend in 2020 to massive MI, stress r/t disability and finances. He is on zoloft 100 mg, buspar 10 mg TID which has helped, xanax 0.25 mg occasionally in the evenings, no longer taking daily. Formerly on wellbutrin, was stopped due to ? Seizure at [employer]. Feels fairly controlled.  BMI is There is no height or weight on file to  calculate BMI., he is working on diet and exercise.,  4-5 bottles a day of water . Down 9 pounds since last visit at our office in 12/2020. He is doing more exercise- cardio and weights Wt Readings from Last 3 Encounters:  07/08/21 233 lb 6.4 oz (105.9 kg)  05/12/21 233 lb (105.7 kg)  04/21/21 233 lb (105.7 kg)   His blood pressure has been controlled at home, today their BP is   BP Readings from Last 3 Encounters:  07/08/21 106/62  05/12/21 110/74  04/06/21 102/64     He does workout, walking. He denies chest pain, shortness of breath, dizziness, edema, PND.   He is on cholesterol medication, crestor 40 mg daily, zetia 10 mg daily and denies myalgias. His cholesterol is at goal less than 70. The cholesterol last visit was:   Lab Results  Component Value Date   CHOL 151 04/06/2021   HDL 78 04/06/2021   LDLCALC 56 04/06/2021   TRIG 91 04/06/2021   CHOLHDL 1.9 04/06/2021    He has been working on diet and exercise for prediabetes, and denies increased appetite, nausea, paresthesia of the feet, polydipsia, polyuria, visual disturbances, vomiting and weight loss.  On Jardiance via cardiology for heart -  Doesn't have glucometer but would like one - sample given due to no insurance  Last A1C in the office was:  Lab Results  Component Value Date   HGBA1C 5.5 04/06/2021    Patient is on Vitamin D supplement.   Lab Results  Component Value Date   VD25OH 40 04/22/2013      Current Medications:   Current Outpatient Medications (Endocrine & Metabolic):    empagliflozin (JARDIANCE) 10 MG TABS tablet, Take 1 tablet (10 mg total) by mouth daily.  Current Outpatient Medications (Cardiovascular):    eplerenone (INSPRA) 50 MG tablet, Take 1 tablet by mouth once daily   ezetimibe (ZETIA) 10 MG tablet, Take 1 tablet (10 mg total) by mouth daily.   furosemide (LASIX) 20 MG tablet, Take 1 tablet by mouth once daily   metoprolol succinate (TOPROL-XL) 100 MG 24 hr tablet, Take 1 tablet (100  mg total) by mouth 2 (two) times daily in the morning and at bedtime. Take with or immediately following a meal.   mexiletine (MEXITIL) 150 MG capsule, Take 2 capsules (300 mg total) by mouth 2 (two) times daily.   rosuvastatin (CRESTOR) 40 MG tablet, Take 1 tablet by mouth once daily   sacubitril-valsartan (ENTRESTO) 97-103 MG, Take 1 tablet by mouth 2 (two) times daily.  Current Outpatient Medications (Respiratory):    cetirizine (ZYRTEC) 10 MG tablet, Take 10 mg by mouth daily.  Current Outpatient Medications (Analgesics):    acetaminophen (TYLENOL) 500 MG tablet, Take 1,000 mg by mouth every 6 (six) hours as needed for mild pain or headache.   aspirin EC 81 MG tablet, Take 81 mg by mouth daily. Swallow whole.   Current Outpatient Medications (Other):    ALPRAZolam (XANAX) 0.5 MG tablet, TAKE 1/2-1 TABLET BY MOUTH AT BEDTIME ONLY IF NEEDED FOR SLEEP AND LIMIT TO 5 DAYS A WEEK TO AVOID ADDICTION AND DEMENTIA   busPIRone (BUSPAR) 10 MG tablet, TAKE 1 TABLET BY MOUTH THREE TIMES DAILY FOR CHRONIC ANXIETY AND MOOD   Cholecalciferol (VITAMIN D3) 125 MCG (5000 UT) TABS, Take 5,000 Units by mouth daily.    pantoprazole (PROTONIX) 40 MG tablet, Take 1 tablet by mouth once daily  potassium chloride (KLOR-CON) 10 MEQ tablet, Take 1 tablet by mouth once daily   sertraline (ZOLOFT) 100 MG tablet, Take 1 tablet by mouth once daily  Medical History:  Past Medical History:  Diagnosis Date   Anxiety    on meds   Aortic root aneurysm (HCC)    Aortic stenosis    Ascending aortic aneurysm (HCC)    CAD (coronary artery disease)    Chronic combined systolic and diastolic congestive heart failure (HCC)    on meds   Depression    on meds   Diabetes mellitus without complication ([place])    on meds   Dilated aortic root (HCC)    Hyperlipidemia    on meds   Hypertension    on meds   Non-ischemic cardiomyopathy ([place])    Nonischemic cardiomyopathy ([place])    S/P aortic valve replacement with  bioprosthetic valve 04/10/2018   29 mm Edwards Inspiris Resilia stented bovine pericardial tissue valve   S/P ascending aortic aneurysm repair 04/10/2018   26 mm Hemashield Platinum supracoronary straight graft   Seasonal allergies    SVT (supraventricular tachycardia) (HCC)    Viral myocarditis 2004   Vitamin D deficiency    Allergies No Known Allergies   SURGICAL HISTORY He  has a past surgical history that includes Orchiectomy (Left, 02/08/1980); Varicose vein surgery (Left, 05/09/1998); RIGHT HEART CATH AND CORONARY ANGIOGRAPHY (N/A, 03/28/2018); Multiple extractions with alveoloplasty (N/A, 04/05/2018); Ascending aortic root replacement (N/A, 04/10/2018); TEE without cardioversion (N/A, 04/10/2018); Aortic valve replacement (N/A, 04/10/2018); and Inguinal hernia repair (Left). FAMILY HISTORY His family history includes Alzheimer's disease in his mother; Breast cancer (age of onset: 54) in his mother; Cancer in his father; Colon cancer (age of onset: 75) in his maternal grandmother; Colon polyps in his father; Colon polyps (age of onset: 33) in his maternal grandmother; Diabetes in his daughter, mother, and son; Esophageal cancer (age of onset: 92) in his father; Hypertension in his father, mother, and sister. SOCIAL HISTORY He  reports that he quit smoking about 3 years ago. His smoking use included cigarettes. He started smoking about 43 years ago. He has a 30.00 pack-year smoking history. He has never used smokeless tobacco. He reports that he does not currently use alcohol after a past usage of about 8.0 standard drinks per week. He reports that he does not use drugs.   Review of Systems  Constitutional:  Positive for malaise/fatigue. Negative for weight loss.  HENT:  Negative for hearing loss and tinnitus.   Eyes:  Negative for blurred vision and double vision.  Respiratory:  Negative for cough, shortness of breath and wheezing.   Cardiovascular:  Negative for chest pain,  palpitations, orthopnea, claudication and leg swelling.  Gastrointestinal:  Negative for abdominal pain, blood in stool, constipation, diarrhea, heartburn, melena, nausea and vomiting.  Genitourinary: Negative.   Musculoskeletal:  Negative for joint pain and myalgias.  Skin:  Negative for rash.  Neurological:  Negative for dizziness, tingling, sensory change, weakness and headaches.  Endo/Heme/Allergies:  Negative for polydipsia.  Psychiatric/Behavioral:  Negative for depression, substance abuse and suicidal ideas. The patient is nervous/anxious and has insomnia.   All other systems reviewed and are negative.  Physical Exam: There were no vitals taken for this visit. Wt Readings from Last 3 Encounters:  07/08/21 233 lb 6.4 oz (105.9 kg)  05/12/21 233 lb (105.7 kg)  04/21/21 233 lb (105.7 kg)  There is no height or weight on file to calculate BMI.  General Appearance: Well  nourished, well dressed male in no acute distress. Eyes: PERRLA, EOMs, conjunctiva no swelling or erythema Sinuses: No Frontal/maxillary tenderness ENT/Mouth: Ext aud canals clear, TMs without erythema, bulging. No erythema, swelling, or exudate on post pharynx.  Tonsils not swollen or erythematous. Hearing normal.  Neck: Supple, thyroid normal.  Respiratory: Respiratory effort normal, BS equal bilaterally without rales, rhonchi, wheezing or stridor.  Cardio: Regular rate and rhythm, no murmurs, rubs or gallops.  Brisk peripheral pulses without significant edmea;  large torturous varicose veins bilateral legs, non-tender.  Abdomen: Soft, + BS.  Non tender, no guarding, rebound, hernias, masses. Lymphatics: Non tender without lymphadenopathy.  Musculoskeletal: Full ROM, 5/5 strength, normal gait.  Skin: Warm, dry without rashes, lesions, ecchymosis.  Neuro: Cranial nerves intact. Normal muscle tone, no cerebellar symptoms.  Psych: Awake and oriented X 3, mildly anxious affect, Insight and Judgment appropriate.    Hristopher Missildine  W Dezirea Mccollister 12:59 PM

## 2021-07-13 ENCOUNTER — Ambulatory Visit: Payer: Medicare Other | Admitting: Nurse Practitioner

## 2021-07-13 DIAGNOSIS — I493 Ventricular premature depolarization: Secondary | ICD-10-CM

## 2021-07-13 DIAGNOSIS — R7309 Other abnormal glucose: Secondary | ICD-10-CM

## 2021-07-13 DIAGNOSIS — I471 Supraventricular tachycardia: Secondary | ICD-10-CM

## 2021-07-13 DIAGNOSIS — E785 Hyperlipidemia, unspecified: Secondary | ICD-10-CM

## 2021-07-13 DIAGNOSIS — I5042 Chronic combined systolic (congestive) and diastolic (congestive) heart failure: Secondary | ICD-10-CM

## 2021-07-13 DIAGNOSIS — F419 Anxiety disorder, unspecified: Secondary | ICD-10-CM

## 2021-07-13 DIAGNOSIS — Z953 Presence of xenogenic heart valve: Secondary | ICD-10-CM

## 2021-07-13 DIAGNOSIS — I10 Essential (primary) hypertension: Secondary | ICD-10-CM

## 2021-07-13 DIAGNOSIS — Z79899 Other long term (current) drug therapy: Secondary | ICD-10-CM

## 2021-07-13 DIAGNOSIS — E559 Vitamin D deficiency, unspecified: Secondary | ICD-10-CM

## 2021-07-13 DIAGNOSIS — I428 Other cardiomyopathies: Secondary | ICD-10-CM

## 2021-07-13 DIAGNOSIS — Z87891 Personal history of nicotine dependence: Secondary | ICD-10-CM

## 2021-07-23 ENCOUNTER — Other Ambulatory Visit (HOSPITAL_COMMUNITY): Payer: Self-pay | Admitting: Cardiology

## 2021-07-23 DIAGNOSIS — E785 Hyperlipidemia, unspecified: Secondary | ICD-10-CM

## 2021-08-02 ENCOUNTER — Other Ambulatory Visit (HOSPITAL_COMMUNITY): Payer: Self-pay

## 2021-08-04 ENCOUNTER — Telehealth (HOSPITAL_COMMUNITY): Payer: Self-pay | Admitting: Pharmacy Technician

## 2021-08-04 ENCOUNTER — Other Ambulatory Visit (HOSPITAL_COMMUNITY): Payer: Self-pay

## 2021-08-04 NOTE — Telephone Encounter (Signed)
Advanced Heart Failure Patient Advocate Encounter  The patient was approved for a [employer] grant that will help cover the cost of Jardiance, Entresto. Total amount awarded, $10,000. Eligibility, 07/06/22 - 07/04/21.  ID [id number]  BIN [id number]  PCN [id number]  Group [id number]  Emailed a copy of the grant information to the patient.   Charlann Boxer, CPhT

## 2021-08-05 ENCOUNTER — Other Ambulatory Visit (HOSPITAL_COMMUNITY): Payer: Self-pay

## 2021-08-30 ENCOUNTER — Other Ambulatory Visit (HOSPITAL_COMMUNITY): Payer: Self-pay

## 2021-09-27 ENCOUNTER — Other Ambulatory Visit (HOSPITAL_COMMUNITY): Payer: Self-pay | Admitting: *Deleted

## 2021-09-27 ENCOUNTER — Other Ambulatory Visit (HOSPITAL_COMMUNITY): Payer: Self-pay

## 2021-09-27 MED ORDER — SACUBITRIL-VALSARTAN 97-103 MG PO TABS
1.0000 | ORAL_TABLET | Freq: Two times a day (BID) | ORAL | 2 refills | Status: DC
  Filled 2021-09-27: qty 180, 90d supply, fill #0
  Filled 2022-01-13: qty 180, 90d supply, fill #1
  Filled 2022-03-28: qty 180, 90d supply, fill #2

## 2021-10-05 ENCOUNTER — Other Ambulatory Visit: Payer: Self-pay | Admitting: Nurse Practitioner

## 2021-10-05 DIAGNOSIS — F419 Anxiety disorder, unspecified: Secondary | ICD-10-CM

## 2021-10-11 ENCOUNTER — Other Ambulatory Visit (HOSPITAL_COMMUNITY): Payer: Self-pay | Admitting: Cardiology

## 2021-10-11 DIAGNOSIS — E785 Hyperlipidemia, unspecified: Secondary | ICD-10-CM

## 2021-10-20 ENCOUNTER — Other Ambulatory Visit (HOSPITAL_COMMUNITY): Payer: Self-pay | Admitting: Cardiology

## 2021-10-25 ENCOUNTER — Other Ambulatory Visit (HOSPITAL_COMMUNITY): Payer: Self-pay

## 2021-10-25 MED ORDER — PANTOPRAZOLE SODIUM 40 MG PO TBEC: 40.0000 mg | DELAYED_RELEASE_TABLET | Freq: Every day | ORAL | 11 refills | Status: DC

## 2021-10-25 NOTE — Telephone Encounter (Signed)
Meds ordered this encounter  Medications   pantoprazole (PROTONIX) 40 MG tablet    Sig: Take 1 tablet (40 mg total) by mouth daily.    Dispense:  30 tablet    Refill:  11   Dr. Aundra Dubin started this patient on this. Will continue to refill

## 2021-11-01 ENCOUNTER — Other Ambulatory Visit (HOSPITAL_COMMUNITY): Payer: Self-pay

## 2021-11-01 ENCOUNTER — Other Ambulatory Visit (HOSPITAL_COMMUNITY): Payer: Self-pay | Admitting: Cardiology

## 2021-11-01 MED ORDER — EMPAGLIFLOZIN 10 MG PO TABS
10.0000 mg | ORAL_TABLET | Freq: Every day | ORAL | 0 refills | Status: DC
Start: 2021-11-01 — End: 2022-01-27
  Filled 2021-11-01: qty 90, 90d supply, fill #0

## 2021-12-27 ENCOUNTER — Other Ambulatory Visit (HOSPITAL_COMMUNITY): Payer: Self-pay

## 2021-12-27 NOTE — Progress Notes (Signed)
Patient ID: Tyler Berger, male   DOB: 1960/04/07, 61 y.o.   MRN: [id number]  COMPLETE PHYSICAL  Assessment and Plan:   Encounter for general adult medical examination with abnormal findings Patient does not have insurance- will do minimal labs as verified with him Order placed for Colonoscopy for Colon Cancer SCREENING  Essential hypertension Continue medications Monitor blood pressure at home; call if consistently over 130/80 Continue DASH diet.   Reminder to go to the ER if any CP, SOB, nausea, dizziness, severe HA, changes vision/speech, left arm numbness and tingling and jaw pain. CBC CMP Microalbumin/creatinine urine ratio  Chronic combined systolic and diastolic congestive heart failure [facility]) May try to maximize medications/blood pressure LONG discussion about sodium/sugar and maximizing medications to help his heart not work as hard.  Goal weight is likely closer to 235-240  Weight daily and bring log of weight and BP to heart failure clinic He has not been to the heart failure clinic for some time, referral is placed since he now has insurance  Non-ischemic cardiomyopathy ([place]) Try to maximize medications/blood pressure LONG discussion about sodium/sugar and maximizing medications to help his heart not work as hard.   Goal weight is likely closer to 235-240 WEIGH DAILY and bring log of weight and BP to heart failure clinic Strongly encouraged use of compression socks to help with edema  SVT (supraventricular tachycardia) (HCC) Remains controlled with meds; cardiology following   PVC (premature ventricular contraction) NSR at this time, recent Zio with burden down to 3.4% Off of amiodarone; cardiology following  S/P aortic valve replacement with bioprosthetic valve + resection ascending thoracic aortic aneurysm Continue cardiology follow up  Hyperlipidemia, unspecified hyperlipidemia type Continue rosuvastatin; Has not seen Heart failure clinic in some time decrease  fatty foods increase activity.  Lipid panel  Abnormal glucose (prediabetes) Discussed disease and risks; on jardiance for heart/sugars  Discussed diet/exercise, weight management  Given glucometer sample and strips, can check PRN fasting  If A1C 7+ plan to start metformin -     Hemoglobin A1c  Anxiety Continue zoloft, buspar and xanax AS needed Declines dose change today;  Financial stress significant - pending disability  Stress management techniques discussed, increase water, good sleep hygiene discussed, increase exercise, and increase veggies.   Vitamin D deficiency Continue supplement   Former smoker Quit smoking, congratulated the patient and encouraged him to continue.  30 pack year; once he has insurance will discuss low dose CT; denies concerning sx today   Prostate cancer screening PSA   Continue diet and meds as discussed. Further disposition pending results of labs. Future Appointments  Date Time Provider [place]  12/28/2021  3:00 PM Alycia Rossetti, NP [facility] None  01/02/2023  3:00 PM Alycia Rossetti, NP [facility] None    HPI 61 y.o. male  presents for CPE. He has Hyperlipidemia; Hypertension; Anxiety; Vitamin D deficiency; Medication management; Varicose veins of both lower extremities; LBBB (left bundle branch block); SVT (supraventricular tachycardia); Former smoker (30 pack year history, quit 03/2018); Non-ischemic cardiomyopathy ([place]); Chronic combined systolic and diastolic congestive heart failure (HCC); S/P aortic valve replacement with bioprosthetic valve + resection ascending thoracic aortic aneurysm; S/P ascending aortic aneurysm repair; PVC (premature ventricular contraction); Coronary artery disease involving native coronary artery of native heart without angina pectoris; Obesity (BMI 30.0-34.9); and Other abnormal glucose (prediabetes) on their problem list.  He is divorced from wife, has male partner, not frequently active, declines  STD testing.  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  Currently uninsured and requests selective labs.   Patient is s/p aortic valve replacement with biosynthetic valve and thoracic aortic aneurysm repair on 04/10/2018 by Dr. Roxy Manns, has systolic heart failure with post op TEE showing EF 35-40%.  He was placed on amiodarone due to PVCs with improvement, then transitioned to mexiletine, toprol, entresto. Following with Dr. Aundra Dubin at the heart failure clinic. Echo 10/2018 showed EF up to 40-45%, normally functioning valve, NYHA class II. Mod nonobstructive CAD per 03/2018 cath.  Zio patch in 4/21 showed PVC burden down to 3.4%.  He has been stable generally, though with ongoing limitations with activity and fatigue.  He recently acquired health insurance.  Has not been seen by Heart failure clinic in over a year. Continues on medications  he has never had a colonoscopy due to uninsured/cost, on review had AB CTA 03/2018 that was negative for cancer/masses at that time, also normal appearing prostate.   He has depression/anxiety, feels like he is doing fairly well He is on zoloft 100 mg, buspar 10 mg TID which has helped, xanax 0.25 mg most evenings with good results. Formerly on wellbutrin, was stopped due to ? Seizure at [employer]. Feels fairly controlled, just ready for financial stress to be resolved, was living in car for a while.   BMI is There is no height or weight on file to calculate BMI., he is working on diet and exercise., trying to eat healthy, oatmeal, lean proteins, fruits/veggies, watches sodium closely  Was formerly very active, went to GYM daily, slowly trying to increase now walking around the block in his neighborhood 2-3 days a week 4-5 bottles a day of water  1 glass wine/week  Wt Readings from Last 3 Encounters:  07/08/21 233 lb 6.4 oz (105.9 kg)  05/12/21 233 lb (105.7  kg)  04/21/21 233 lb (105.7 kg)   His blood pressure has been controlled at home, today their BP is    He does workout, walking 3 days a week. He denies chest pain, shortness of breath, dizziness, edema, PND. Has intermittent fatigue, "just don't feel good."   He is on cholesterol medication, crestor 40 mg daily and denies myalgias. His cholesterol is not at goal less than 70. The cholesterol last visit was:   Lab Results  Component Value Date   CHOL 151 04/06/2021   HDL 78 04/06/2021   LDLCALC 56 04/06/2021   TRIG 91 04/06/2021   CHOLHDL 1.9 04/06/2021    He has been working on diet and exercise for prediabetes, and denies increased appetite, nausea, paresthesia of the feet, polydipsia, polyuria, visual disturbances, vomiting and weight loss.  On Jardiance via cardiology for heart -  Last A1C in the office was:  Lab Results  Component Value Date   HGBA1C 5.5 04/06/2021    Patient is on Vitamin D supplement.   Lab Results  Component Value Date   VD25OH 40 04/22/2013     Lab Results  Component Value Date   PSA 0.53 12/28/2020    Denies obstructive urinary sx; straining, nocturia, dribbing, frequency   Current Medications:   Current Outpatient Medications (Endocrine & Metabolic):    empagliflozin (JARDIANCE) 10 MG TABS tablet, Take 1 tablet (10 mg total) by mouth daily. NEEDS FOLLOW UP APPOINTMENT FOR ANYMORE REFILLS  Current Outpatient Medications (Cardiovascular):    eplerenone (INSPRA) 50 MG tablet, Take 1 tablet by  mouth once daily   ezetimibe (ZETIA) 10 MG tablet, Take 1 tablet (10 mg total) by mouth daily.   furosemide (LASIX) 20 MG tablet, Take 1 tablet by mouth once daily   metoprolol succinate (TOPROL-XL) 100 MG 24 hr tablet, Take 1 tablet (100 mg total) by mouth 2 (two) times daily in the morning and at bedtime. Take with or immediately following a meal.   mexiletine (MEXITIL) 150 MG capsule, Take 2 capsules (300 mg total) by mouth 2 (two) times daily.    rosuvastatin (CRESTOR) 40 MG tablet, TAKE 1 TABLET BY MOUTH ONCE DAILY . APPOINTMENT REQUIRED FOR FUTURE REFILLS   sacubitril-valsartan (ENTRESTO) 97-103 MG, Take 1 tablet by mouth 2 (two) times daily.  Current Outpatient Medications (Respiratory):    cetirizine (ZYRTEC) 10 MG tablet, Take 10 mg by mouth daily.  Current Outpatient Medications (Analgesics):    acetaminophen (TYLENOL) 500 MG tablet, Take 1,000 mg by mouth every 6 (six) hours as needed for mild pain or headache.   aspirin EC 81 MG tablet, Take 81 mg by mouth daily. Swallow whole.   Current Outpatient Medications (Other):    ALPRAZolam (XANAX) 0.5 MG tablet, TAKE 1/2-1 TABLET BY MOUTH AT BEDTIME ONLY IF NEEDED FOR SLEEP AND LIMIT TO 5 DAYS A WEEK TO AVOID ADDICTION AND DEMENTIA   busPIRone (BUSPAR) 10 MG tablet, TAKE 1 TABLET BY MOUTH THREE TIMES DAILY FOR  CHRONIC  ANXIETY  AND  MOOD   Cholecalciferol (VITAMIN D3) 125 MCG (5000 UT) TABS, Take 5,000 Units by mouth daily.    pantoprazole (PROTONIX) 40 MG tablet, Take 1 tablet (40 mg total) by mouth daily.   potassium chloride (KLOR-CON) 10 MEQ tablet, Take 1 tablet by mouth once daily   sertraline (ZOLOFT) 100 MG tablet, Take 1 tablet by mouth once daily  Medical History:  Past Medical History:  Diagnosis Date   Anxiety    on meds   Aortic root aneurysm (HCC)    Aortic stenosis    Ascending aortic aneurysm (HCC)    CAD (coronary artery disease)    Chronic combined systolic and diastolic congestive heart failure (HCC)    on meds   Depression    on meds   Diabetes mellitus without complication ([place])    on meds   Dilated aortic root (HCC)    Hyperlipidemia    on meds   Hypertension    on meds   Non-ischemic cardiomyopathy ([place])    Nonischemic cardiomyopathy ([place])    S/P aortic valve replacement with bioprosthetic valve 04/10/2018   29 mm Edwards Inspiris Resilia stented bovine pericardial tissue valve   S/P ascending aortic aneurysm repair 04/10/2018   26 mm  Hemashield Platinum supracoronary straight graft   Seasonal allergies    SVT (supraventricular tachycardia) ([place])    Viral myocarditis 2004   Vitamin D deficiency    Allergies No Known Allergies  Immunization History  Administered Date(s) Administered   Influenza Inj Mdck Quad Pf 12/05/2016   Influenza Inj Mdck Quad With Preservative 11/11/2019   Influenza,inj,Quad PF,6+ Mos 11/10/2017, 12/28/2020   Influenza-Unspecified 12/05/2016, 11/28/2018   Pneumococcal-Unspecified 02/08/1992   Td 02/07/1997   Tdap 06/01/2017   Colonoscopy: never, defer due to no insurance, consider cologuard  Echo 10/2018 Cath 03/2018  CXR 04/2018 CTA chest 03/2018  Last tetanus: 2019 Last influenza: 12/28/20 Covid 19: 2/2, 2021, pfizer   Last eye: has readers, no other concerns Last dental: no concerns, last 2020  SURGICAL HISTORY He  has a past surgical  history that includes Orchiectomy (Left, 02/08/1980); Varicose vein surgery (Left, 05/09/1998); RIGHT HEART CATH AND CORONARY ANGIOGRAPHY (N/A, 03/28/2018); Multiple extractions with alveoloplasty (N/A, 04/05/2018); Ascending aortic root replacement (N/A, 04/10/2018); TEE without cardioversion (N/A, 04/10/2018); Aortic valve replacement (N/A, 04/10/2018); and Inguinal hernia repair (Left). FAMILY HISTORY His family history includes Alzheimer's disease in his mother; Breast cancer (age of onset: 82) in his mother; Cancer in his father; Colon cancer (age of onset: 80) in his maternal grandmother; Colon polyps in his father; Colon polyps (age of onset: 36) in his maternal grandmother; Diabetes in his daughter, mother, and son; Esophageal cancer (age of onset: 35) in his father; Hypertension in his father, mother, and sister. SOCIAL HISTORY He  reports that he quit smoking about 3 years ago. His smoking use included cigarettes. He started smoking about 43 years ago. He has a 30.00 pack-year smoking history. He has never used smokeless tobacco. He reports that he  does not currently use alcohol after a past usage of about 8.0 standard drinks of alcohol per week. He reports that he does not use drugs.   Review of Systems  Constitutional:  Negative for chills, fever, malaise/fatigue and weight loss.  HENT:  Negative for congestion, hearing loss, sinus pain, sore throat and tinnitus.   Eyes:  Negative for blurred vision and double vision.  Respiratory:  Negative for cough, hemoptysis, sputum production, shortness of breath and wheezing.   Cardiovascular:  Positive for leg swelling. Negative for chest pain, palpitations, orthopnea and claudication.  Gastrointestinal:  Negative for abdominal pain, blood in stool, constipation, diarrhea, heartburn, melena, nausea and vomiting.  Genitourinary: Negative.  Negative for dysuria and urgency.  Musculoskeletal:  Negative for back pain, falls, joint pain, myalgias and neck pain.  Skin:  Negative for rash.  Neurological:  Negative for dizziness, tingling, tremors, sensory change, weakness and headaches.  Endo/Heme/Allergies:  Negative for polydipsia. Does not bruise/bleed easily.  Psychiatric/Behavioral:  Negative for depression, substance abuse and suicidal ideas. The patient is not nervous/anxious and does not have insomnia.   All other systems reviewed and are negative.   Physical Exam: There were no vitals taken for this visit. Wt Readings from Last 3 Encounters:  07/08/21 233 lb 6.4 oz (105.9 kg)  05/12/21 233 lb (105.7 kg)  04/21/21 233 lb (105.7 kg)  There is no height or weight on file to calculate BMI.  General Appearance: Well nourished, well dressed male in no acute distress. Eyes: PERRLA, EOMs, conjunctiva no swelling or erythema Sinuses: No Frontal/maxillary tenderness ENT/Mouth: Ext aud canals clear, TMs without erythema, bulging. No erythema, swelling, or exudate on post pharynx.  Tonsils not swollen or erythematous. Hearing normal.  Neck: Supple, thyroid normal.  Respiratory: Respiratory effort  normal, BS equal bilaterally without rales, rhonchi, wheezing or stridor.  Cardio: Regular rate and rhythm, no murmurs, rubs or gallops.  Brisk peripheral pulses with 1+ pitting edema of ankles and feet bilaterally;  large torturous varicose veins bilateral legs, non-tender.  Abdomen: Soft, + BS.  Non tender, no guarding, rebound, hernias, masses. Lymphatics: Non tender without lymphadenopathy.  Musculoskeletal: Full ROM, 5/5 strength, normal gait.  Skin: Warm, dry without rashes, lesions, ecchymosis.  Neuro: Cranial nerves intact. Normal muscle tone, no cerebellar symptoms.  Psych: Awake and oriented X 3, anxious affect, Insight and Judgment appropriate.  GU: reports regular self exams, DECLINES today   EKG: LBBB, AV Block I  Harjit Douds E  12:28 PM

## 2021-12-28 ENCOUNTER — Encounter: Payer: Self-pay | Admitting: Nurse Practitioner

## 2021-12-28 ENCOUNTER — Ambulatory Visit (INDEPENDENT_AMBULATORY_CARE_PROVIDER_SITE_OTHER): Payer: Medicare Other | Admitting: Nurse Practitioner

## 2021-12-28 VITALS — BP 106/68 | HR 72 | Temp 97.3°F | Ht 72.0 in | Wt 247.8 lb

## 2021-12-28 DIAGNOSIS — Z1329 Encounter for screening for other suspected endocrine disorder: Secondary | ICD-10-CM

## 2021-12-28 DIAGNOSIS — Z79899 Other long term (current) drug therapy: Secondary | ICD-10-CM | POA: Diagnosis not present

## 2021-12-28 DIAGNOSIS — Z0000 Encounter for general adult medical examination without abnormal findings: Secondary | ICD-10-CM | POA: Diagnosis not present

## 2021-12-28 DIAGNOSIS — R7309 Other abnormal glucose: Secondary | ICD-10-CM | POA: Diagnosis not present

## 2021-12-28 DIAGNOSIS — Z0001 Encounter for general adult medical examination with abnormal findings: Secondary | ICD-10-CM

## 2021-12-28 DIAGNOSIS — F419 Anxiety disorder, unspecified: Secondary | ICD-10-CM

## 2021-12-28 DIAGNOSIS — Z953 Presence of xenogenic heart valve: Secondary | ICD-10-CM

## 2021-12-28 DIAGNOSIS — I77810 Thoracic aortic ectasia: Secondary | ICD-10-CM

## 2021-12-28 DIAGNOSIS — I428 Other cardiomyopathies: Secondary | ICD-10-CM

## 2021-12-28 DIAGNOSIS — I10 Essential (primary) hypertension: Secondary | ICD-10-CM

## 2021-12-28 DIAGNOSIS — Z136 Encounter for screening for cardiovascular disorders: Secondary | ICD-10-CM

## 2021-12-28 DIAGNOSIS — E785 Hyperlipidemia, unspecified: Secondary | ICD-10-CM

## 2021-12-28 DIAGNOSIS — E559 Vitamin D deficiency, unspecified: Secondary | ICD-10-CM | POA: Diagnosis not present

## 2021-12-28 DIAGNOSIS — Z1389 Encounter for screening for other disorder: Secondary | ICD-10-CM

## 2021-12-28 DIAGNOSIS — I471 Supraventricular tachycardia, unspecified: Secondary | ICD-10-CM

## 2021-12-28 DIAGNOSIS — Z87891 Personal history of nicotine dependence: Secondary | ICD-10-CM

## 2021-12-28 DIAGNOSIS — I700 Atherosclerosis of aorta: Secondary | ICD-10-CM

## 2021-12-28 DIAGNOSIS — Z23 Encounter for immunization: Secondary | ICD-10-CM

## 2021-12-28 DIAGNOSIS — I5042 Chronic combined systolic (congestive) and diastolic (congestive) heart failure: Secondary | ICD-10-CM

## 2021-12-28 DIAGNOSIS — Z125 Encounter for screening for malignant neoplasm of prostate: Secondary | ICD-10-CM | POA: Diagnosis not present

## 2021-12-28 MED ORDER — FUROSEMIDE 40 MG PO TABS: 40.0000 mg | ORAL_TABLET | Freq: Every day | ORAL | 11 refills | Status: DC

## 2021-12-28 NOTE — Patient Instructions (Signed)

## 2021-12-29 LAB — COMPLETE METABOLIC PANEL WITH GFR
AG Ratio: 1.7 (calc) (ref 1.0–2.5)
ALT: 31 U/L (ref 9–46)
AST: 30 U/L (ref 10–35)
Albumin: 4.6 g/dL (ref 3.6–5.1)
Alkaline phosphatase (APISO): 55 U/L (ref 35–144)
BUN: 16 mg/dL (ref 7–25)
CO2: 25 mmol/L (ref 20–32)
Calcium: 9.5 mg/dL (ref 8.6–10.3)
Chloride: 104 mmol/L (ref 98–110)
Creat: 0.82 mg/dL (ref 0.70–1.35)
Globulin: 2.7 g/dL (calc) (ref 1.9–3.7)
Glucose, Bld: 100 mg/dL — ABNORMAL HIGH (ref 65–99)
Potassium: 4.6 mmol/L (ref 3.5–5.3)
Sodium: 136 mmol/L (ref 135–146)
Total Bilirubin: 0.4 mg/dL (ref 0.2–1.2)
Total Protein: 7.3 g/dL (ref 6.1–8.1)
eGFR: 99 mL/min/{1.73_m2} (ref >=60)

## 2021-12-29 LAB — CBC WITH DIFFERENTIAL/PLATELET
Absolute Monocytes: 844 cells/uL (ref 200–950)
Basophils Absolute: 52 cells/uL (ref 0–200)
Basophils Relative: 0.7 %
Eosinophils Absolute: 170 cells/uL (ref 15–500)
Eosinophils Relative: 2.3 %
HCT: 49.9 % (ref 38.5–50.0)
Hemoglobin: 17.6 g/dL — ABNORMAL HIGH (ref 13.2–17.1)
Lymphs Abs: 1732 cells/uL (ref 850–3900)
MCH: 32.2 pg (ref 27.0–33.0)
MCHC: 35.3 g/dL (ref 32.0–36.0)
MCV: 91.4 fL (ref 80.0–100.0)
MPV: 10.5 fL (ref 7.5–12.5)
Monocytes Relative: 11.4 %
Neutro Abs: 4603 cells/uL (ref 1500–7800)
Neutrophils Relative %: 62.2 %
Platelets: 113 10*3/uL — ABNORMAL LOW (ref 140–400)
RBC: 5.46 10*6/uL (ref 4.20–5.80)
RDW: 12.4 % (ref 11.0–15.0)
Total Lymphocyte: 23.4 %
WBC: 7.4 10*3/uL (ref 3.8–10.8)

## 2021-12-29 LAB — HEMOGLOBIN A1C
Hgb A1c MFr Bld: 6.1 % of total Hgb — ABNORMAL HIGH (ref <5.7)
Mean Plasma Glucose: 128 mg/dL
eAG (mmol/L): 7.1 mmol/L

## 2021-12-29 LAB — URINALYSIS, ROUTINE W REFLEX MICROSCOPIC
Bilirubin Urine: NEGATIVE
Hgb urine dipstick: NEGATIVE
Ketones, ur: NEGATIVE
Leukocytes,Ua: NEGATIVE
Nitrite: NEGATIVE
Protein, ur: NEGATIVE
Specific Gravity, Urine: 1.004 (ref 1.001–1.035)
pH: 5.5 (ref 5.0–8.0)

## 2021-12-29 LAB — MAGNESIUM: Magnesium: 2.1 mg/dL (ref 1.5–2.5)

## 2021-12-29 LAB — VITAMIN D 25 HYDROXY (VIT D DEFICIENCY, FRACTURES): Vit D, 25-Hydroxy: 81 ng/mL (ref 30–100)

## 2021-12-29 LAB — LIPID PANEL
Cholesterol: 138 mg/dL (ref <200)
HDL: 65 mg/dL (ref >=40)
LDL Cholesterol (Calc): 54 mg/dL (calc)
Non-HDL Cholesterol (Calc): 73 mg/dL (calc) (ref <130)
Total CHOL/HDL Ratio: 2.1 (calc) (ref <5.0)
Triglycerides: 108 mg/dL (ref <150)

## 2021-12-29 LAB — PSA: PSA: 0.54 ng/mL (ref <=4.00)

## 2021-12-29 LAB — MICROALBUMIN / CREATININE URINE RATIO
Creatinine, Urine: 10 mg/dL — ABNORMAL LOW (ref 20–320)
Microalb Creat Ratio: 70 mcg/mg creat — ABNORMAL HIGH (ref <30)
Microalb, Ur: 0.7 mg/dL

## 2021-12-29 LAB — TSH: TSH: 1.56 mIU/L (ref 0.40–4.50)

## 2022-01-13 ENCOUNTER — Other Ambulatory Visit (HOSPITAL_COMMUNITY): Payer: Self-pay

## 2022-01-24 ENCOUNTER — Other Ambulatory Visit: Payer: Self-pay | Admitting: Cardiology

## 2022-01-25 ENCOUNTER — Other Ambulatory Visit (HOSPITAL_COMMUNITY): Payer: Self-pay | Admitting: Cardiology

## 2022-01-25 DIAGNOSIS — E785 Hyperlipidemia, unspecified: Secondary | ICD-10-CM

## 2022-01-27 ENCOUNTER — Other Ambulatory Visit (HOSPITAL_COMMUNITY): Payer: Self-pay

## 2022-01-27 ENCOUNTER — Other Ambulatory Visit (HOSPITAL_COMMUNITY): Payer: Self-pay | Admitting: Cardiology

## 2022-01-27 MED ORDER — EMPAGLIFLOZIN 10 MG PO TABS
10.0000 mg | ORAL_TABLET | Freq: Every day | ORAL | 0 refills | Status: DC
  Filled 2022-01-27: qty 90, 90d supply, fill #0

## 2022-02-14 ENCOUNTER — Other Ambulatory Visit (HOSPITAL_COMMUNITY): Payer: Self-pay | Admitting: Cardiology

## 2022-02-26 ENCOUNTER — Other Ambulatory Visit: Payer: Self-pay | Admitting: Nurse Practitioner

## 2022-02-26 ENCOUNTER — Other Ambulatory Visit: Payer: Self-pay | Admitting: Cardiology

## 2022-02-26 DIAGNOSIS — E785 Hyperlipidemia, unspecified: Secondary | ICD-10-CM

## 2022-02-26 DIAGNOSIS — F419 Anxiety disorder, unspecified: Secondary | ICD-10-CM

## 2022-02-28 ENCOUNTER — Other Ambulatory Visit: Payer: Self-pay | Admitting: Nurse Practitioner

## 2022-02-28 ENCOUNTER — Telehealth: Payer: Self-pay | Admitting: Nurse Practitioner

## 2022-02-28 MED ORDER — SERTRALINE HCL 100 MG PO TABS: 100.0000 mg | ORAL_TABLET | Freq: Every day | ORAL | 1 refills | Status: DC

## 2022-02-28 NOTE — Telephone Encounter (Signed)
Patient is requesting a refill on Sertraline to [facility] on [address]

## 2022-03-11 ENCOUNTER — Other Ambulatory Visit (HOSPITAL_COMMUNITY): Payer: Self-pay | Admitting: Cardiology

## 2022-03-28 ENCOUNTER — Other Ambulatory Visit: Payer: Self-pay | Admitting: Cardiology

## 2022-03-29 ENCOUNTER — Other Ambulatory Visit (HOSPITAL_COMMUNITY): Payer: Self-pay

## 2022-03-30 ENCOUNTER — Other Ambulatory Visit: Payer: Self-pay | Admitting: Cardiology

## 2022-03-30 DIAGNOSIS — E785 Hyperlipidemia, unspecified: Secondary | ICD-10-CM

## 2022-04-15 NOTE — Progress Notes (Signed)
PCP: Unk Pinto, MD HF Cardiology: Dr Aundra Dubin   HPI: 62 y.o. with history of cardiomyopathy and aortic stenosis returns for followup in CHF.  In 2004, he was diagnosed with nonischemic cardiomyopathy, EF 20% at that time.  His EF recovered back up to 60-65%, but he was noted to have moderate aortic stenosis with a bicuspid valve. He was not seen by cardiology for a number of years.    He was in the ER in 1/20 with SVT that converted spontaneously.  He was set up for an echo and sent back to cardiology.  Echo showed EF 25-30%, diffuse hypokinesis, probably severe aortic stenosis with bicuspid aortic valve.  He was referred for RHC/LHC.     RHC/LHC was done 2/20.  He had moderate coronary disease but no critical stenoses and no disease to explain his cardiomyopathy.  RHC showed markedly elevated PCWP, mixed pulmonary venous/pulmonary arterial hypertension, and low CI at 1.7.  I decided to admit him for diuresis, optimization of meds, and further workup of aortic stenosis.  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  He was evaluated by the structural heart team but valve area thought to be too big for the existing TAVR valves.  Therefore, surgical AVR was recommended.    On 04/10/18 he underwent bioprosthetic AVR with supracoronary ascending aorta replacement  by Dr Roxy Manns.  He was placed on amiodarone due to increased PVCs post-op.  Discharge weight was 224 pounds.   Echo in 8/20 showed EF 40-45% with mild LVH, septal-lateral dyssynchrony, normal bioprosthetic aortic valve.  Zio patch in 12/20 showed 15.9% PVCs and NSVT runs.  Amiodarone was started. He saw Dr. Curt Bears and amiodarone was stopped, mexiletine was started.  Zio patch in 4/21 showed PVC burden down to 3.4%.  Echo in 10/21 showed EF 55-60%, normal RV, bioprosthetic aortic valve with mean gradient 10 mmHg.  Zio monitor in 12/22 showed 9.7% PVCs, mexiletine was increased by EP.  Echo was done today and reviewed, EF 55-60% with  bioprosthetic aortic valve mean gradient 12 mmHg, normal RV.   He is stable symptomatically.  Not feeling palpitations.  No significant exertional dyspnea with normal ADLs and walking on flat ground.  No chest pain.  No orthopnea/PND.  Weight down 7 lbs.   Labs (8/20): LDL 69, HDL 49, K 3.8, creatinine 1.22  Labs (9/20): K 4.2, creatinine 1.08 Labs (12/20): K 3.4, creatinine 1.22  Labs (3/21): K 4.4, creatinine 1.24 Labs (8/21): K 4.6, creatinine 0.93, LDL 84, HDL 50 Labs (11/22): LDL 64, K 4.3, creatinine 0.95  ECG (personally reviewed): NSR, IVCD 130 msec  PMH: 1. SVT 2. HTN 3. Prior ETOH abuse 4. Prior smoking 5. Chronic systolic CHF: Nonischemic cardiomyopathy.  - Echo (2004) with EF 20%.  - Echo (1/20): EF 25-30%, mild LVH, moderate LV dilation, bicuspid aortic valve with severe AS.  - RHC (2/20): mean RA 12, PA 70/32 mean 47, mean PCWP 30, CI 1.72, PVR 4.2.  - Echo (9/20): EF 40-45%, mild LVH, septal-lateral dyssynchrony, normal RV size and systolic function, bioprosthetic AVR functions normally.  - Echo (10/21): EF 55-60%, normal RV, bioprosthetic aortic valve with mean gradient 10 mmHg. - Echo (1/23): EF 55-60% with bioprosthetic aortic valve mean gradient 12 mmHg, normal RV. 6. LBBB 7. CAD: LHC (2/20) witih 80% stenosis small D1, 50% stenosis moderate D2, 50% mid LCx, 50% distal RCA.  8. Biscuspid aortic valve disorder: Severe AS and dilated ascending aorta.   - 3/20 bioprosthetic AVR + supracoronary ascending  aorta replacement.  9. PVCs: Zio patch 12/20 showed 15.9% PVCs, NSVT runs.  - Zio patch (4/21): 3.4% PVCs, multiple morphologies.  - Zio patch (12/22): 9.7% PVCs  ROS: All systems negative except as listed in HPI, PMH and Problem List.  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  Two children.     Social Determinants of Health   Financial Resource Strain: Medium Risk (09/03/2019)   Overall Financial Resource Strain (CARDIA)    Difficulty of Paying Living Expenses: Somewhat hard  Food Insecurity: No Food Insecurity (09/03/2019)   Hunger Vital Sign    Worried About Running Out of Food in the Last Year: Never true    Ran Out of Food in the Last Year: Never true  Transportation Needs: No Transportation Needs (09/03/2019)   PRAPARE - Hydrologist (Medical): No    Lack of Transportation (Non-Medical): No  Physical Activity: Not on file  Stress: Not on file  Social Connections: Not on file  Intimate Partner Violence: Not on file    FH:  Family History  Problem Relation Age of Onset   Diabetes Mother    Hypertension Mother    Alzheimer's disease Mother    Breast cancer Mother 35       breast   Esophageal cancer Father 53   Colon polyps Father    Hypertension Father    Cancer Father        laryngeal, former smoker   Hypertension Sister    Colon polyps Maternal Grandmother 73   Colon cancer Maternal Grandmother 70   Diabetes Daughter    Diabetes Son    Rectal cancer Neg Hx    Stomach cancer Neg Hx      Current Outpatient Medications  Medication Sig Dispense Refill   acetaminophen (TYLENOL) 500 MG tablet Take 1,000 mg by mouth  every 6 (six) hours as needed for mild pain or headache.     aspirin EC 81 MG tablet Take 81 mg by mouth daily. Swallow whole.     busPIRone (BUSPAR) 10 MG tablet TAKE 1 TABLET BY MOUTH THREE TIMES DAILY FOR  CHRONIC  ANXIETY  AND  MOOD 270 tablet 0   cetirizine (ZYRTEC) 10 MG tablet Take 10 mg by mouth daily. (Patient not taking: Reported on 12/28/2021)     Cholecalciferol (VITAMIN D3) 125 MCG (5000 UT) TABS Take 5,000 Units by mouth daily.      empagliflozin (JARDIANCE) 10 MG TABS tablet Take 1  tablet (10 mg total) by mouth daily. NEEDS FOLLOW UP APPOINTMENT FOR ANYMORE REFILLS 90 tablet 0   eplerenone (INSPRA) 50 MG tablet Take 1 tablet by mouth once daily 30 tablet 6   ezetimibe (ZETIA) 10 MG tablet Take 1 tablet (10 mg total) by mouth daily. 90 tablet 3   furosemide (LASIX) 40 MG tablet Take 1 tablet (40 mg total) by mouth daily. 30 tablet 11   metoprolol succinate (TOPROL-XL) 100 MG 24 hr tablet Take 1 tablet (100 mg total) by mouth 2 (two) times daily in the morning and at bedtime. Take with or immediately following a meal. 180 tablet 3   mexiletine (MEXITIL) 150 MG capsule TAKE 2 CAPSULES BY MOUTH TWICE DAILY . APPOINTMENT REQUIRED FOR FUTURE REFILLS 120 capsule 0   pantoprazole (PROTONIX) 40 MG tablet Take 1 tablet (40 mg total) by mouth daily. 30 tablet 11   potassium chloride (KLOR-CON) 10 MEQ tablet Take 1 tablet by mouth once daily 90 tablet 3   rosuvastatin (CRESTOR) 40 MG tablet TAKE 1 TABLET BY MOUTH ONCE DAILY . APPOINTMENT REQUIRED FOR FUTURE REFILLS 30 tablet 0   sacubitril-valsartan (ENTRESTO) 97-103 MG Take 1 tablet by mouth 2 (two) times daily. 180 tablet 2   sertraline (ZOLOFT) 100 MG tablet Take 1 tablet (100 mg total) by mouth daily. 90 tablet 1   No current facility-administered medications for this visit.    There were no vitals filed for this visit.  Wt Readings from Last 3 Encounters:  12/28/21 112.4 kg (247 lb 12.8 oz)  07/08/21 105.9 kg (233 lb 6.4 oz)   05/12/21 105.7 kg (233 lb)    PHYSICAL EXAM: General: NAD Neck: No JVD, no thyromegaly or thyroid nodule.  Lungs: Clear to auscultation bilaterally with normal respiratory effort. CV: Nondisplaced PMI.  Heart regular S1/S2, no S3/S4, 1/6 SEM RUSB.  No peripheral edema.  No carotid bruit.  Normal pedal pulses.  Abdomen: Soft, nontender, no hepatosplenomegaly, no distention.  Skin: Intact without lesions or rashes.  Neurologic: Alert and oriented x 3.  Psych: Normal affect. Extremities: No clubbing or cyanosis.  HEENT: Normal.   ASSESSMENT & PLAN: 1. Chronic systolic CHF: Nonischemic cardiomyopathy. This pre-existed severe AS, ?if PVCs play a role.  Echo in 9/20 showed EF up to 40-45%, and echo today showed EF 55-60%.  He is not volume overloaded on exam, NYHA class II.  - Continue Entresto 97/103 bid. BMET today.  - Toprol XL 100 mg bid.   - Continue eplerenone 50 mg daily (painful gynecomastia with spironolactone, now resolved).   - Continue Lasix 20 mg daily.  Continue KCl 40 daily.    - Continue empagliflozin 10 mg daily.  - EF is now out of ICD range.   2. Bicuspid aortic valve disorder: Severe AS, now s/p surgical AVR (annulus too large for TAVR) and ascending aorta replacement.  Echo in today showed a normally functioning bioprosthetic aortic valve.  - He will need endocarditis prophylaxis with dental work.  3. ETOH abuse: Rarely drinks alcohol now.  4. PVCs: 15.9% PVCs on 12/20 Zio patch (symptomatic).  Amiodarone started with improvement.  To prevent long-term amiodarone use, he was transitioned to mexiletine. Repeat Zio patch in 4/21 showed PVC burden down to 3.4%.  Multiple morphologies of PVCs, would not be amenable to ablation.  Zio in 12/22 showed 9.7% PVCs => EP increased dose of mexiletine.  He denies palpitations.  - Continue mexiletine.  5. CAD: Moderate nonobstructive CAD on 2/20 cath.  No  chest pain.  I do not think that this contributed to his cardiomyopathy  appreciably.  - Continue Crestor, good lipids in 11/22.  - Continue ASA.   6. LBBB-like IVCD: Chronic.   Followup in 6 months with APP  [place] 04/15/2022

## 2022-04-18 ENCOUNTER — Encounter (HOSPITAL_COMMUNITY): Payer: Self-pay

## 2022-04-18 ENCOUNTER — Ambulatory Visit (HOSPITAL_COMMUNITY)
Admission: RE | Admit: 2022-04-18 | Discharge: 2022-04-18 | Disposition: A | Discharge status: Home / Self Care | Payer: Medicare Other | Source: Ambulatory Visit | Attending: Family Medicine | Admitting: Family Medicine

## 2022-04-18 ENCOUNTER — Other Ambulatory Visit (HOSPITAL_COMMUNITY): Payer: Self-pay | Admitting: Cardiology

## 2022-04-18 ENCOUNTER — Inpatient Hospital Stay (HOSPITAL_COMMUNITY)
Admission: RE | Admit: 2022-04-18 | Discharge: 2022-04-18 | Disposition: A | Discharge status: Home / Self Care | Payer: Medicare Other | Source: Ambulatory Visit | Attending: Cardiology | Admitting: Cardiology

## 2022-04-18 ENCOUNTER — Encounter (HOSPITAL_COMMUNITY): Payer: Self-pay | Admitting: Cardiology

## 2022-04-18 VITALS — BP 104/68 | HR 73 | Wt 252.4 lb

## 2022-04-18 DIAGNOSIS — I350 Nonrheumatic aortic (valve) stenosis: Secondary | ICD-10-CM | POA: Diagnosis not present

## 2022-04-18 DIAGNOSIS — I2510 Atherosclerotic heart disease of native coronary artery without angina pectoris: Secondary | ICD-10-CM | POA: Diagnosis not present

## 2022-04-18 DIAGNOSIS — F1010 Alcohol abuse, uncomplicated: Secondary | ICD-10-CM | POA: Diagnosis not present

## 2022-04-18 DIAGNOSIS — Z7984 Long term (current) use of oral hypoglycemic drugs: Secondary | ICD-10-CM | POA: Insufficient documentation

## 2022-04-18 DIAGNOSIS — I493 Ventricular premature depolarization: Secondary | ICD-10-CM

## 2022-04-18 DIAGNOSIS — I359 Nonrheumatic aortic valve disorder, unspecified: Secondary | ICD-10-CM

## 2022-04-18 DIAGNOSIS — Q231 Congenital insufficiency of aortic valve: Secondary | ICD-10-CM | POA: Diagnosis not present

## 2022-04-18 DIAGNOSIS — Z87891 Personal history of nicotine dependence: Secondary | ICD-10-CM | POA: Insufficient documentation

## 2022-04-18 DIAGNOSIS — I447 Left bundle-branch block, unspecified: Secondary | ICD-10-CM | POA: Insufficient documentation

## 2022-04-18 DIAGNOSIS — Z953 Presence of xenogenic heart valve: Secondary | ICD-10-CM | POA: Insufficient documentation

## 2022-04-18 DIAGNOSIS — I5042 Chronic combined systolic (congestive) and diastolic (congestive) heart failure: Secondary | ICD-10-CM

## 2022-04-18 DIAGNOSIS — I428 Other cardiomyopathies: Secondary | ICD-10-CM | POA: Diagnosis not present

## 2022-04-18 DIAGNOSIS — I2721 Secondary pulmonary arterial hypertension: Secondary | ICD-10-CM | POA: Diagnosis not present

## 2022-04-18 DIAGNOSIS — N62 Hypertrophy of breast: Secondary | ICD-10-CM | POA: Insufficient documentation

## 2022-04-18 DIAGNOSIS — I110 Hypertensive heart disease with heart failure: Secondary | ICD-10-CM | POA: Insufficient documentation

## 2022-04-18 DIAGNOSIS — I5022 Chronic systolic (congestive) heart failure: Secondary | ICD-10-CM | POA: Insufficient documentation

## 2022-04-18 DIAGNOSIS — Z79899 Other long term (current) drug therapy: Secondary | ICD-10-CM | POA: Insufficient documentation

## 2022-04-18 LAB — BASIC METABOLIC PANEL
Anion gap: 10 (ref 5–15)
BUN: 16 mg/dL (ref 8–23)
CO2: 25 mmol/L (ref 22–32)
Calcium: 9.4 mg/dL (ref 8.9–10.3)
Chloride: 98 mmol/L (ref 98–111)
Creatinine, Ser: 1.06 mg/dL (ref 0.61–1.24)
GFR, Estimated: >60 mL/min (ref >60)
Glucose, Bld: 115 mg/dL — ABNORMAL HIGH (ref 70–99)
Potassium: 4.4 mmol/L (ref 3.5–5.1)
Sodium: 133 mmol/L — ABNORMAL LOW (ref 135–145)

## 2022-04-18 LAB — MAGNESIUM: Magnesium: 2.3 mg/dL (ref 1.7–2.4)

## 2022-04-18 LAB — BRAIN NATRIURETIC PEPTIDE: B Natriuretic Peptide: 164.2 pg/mL — ABNORMAL HIGH (ref 0.0–100.0)

## 2022-04-18 LAB — TSH: TSH: 1.749 u[IU]/mL (ref 0.350–4.500)

## 2022-04-18 NOTE — Patient Instructions (Addendum)
EKG done today.  Labs done today. We will contact you only if your labs are abnormal.  No medication changes were made. Please continue all current medications as prescribed.  Your physician recommends that you schedule a follow-up appointment in: 1-2 weeks for an echo and in 4 months with Dr. Aundra Dubin. Please contact our office in May to schedule a July appointment.   Your physician has requested that you have an echocardiogram. Echocardiography is a painless test that uses sound waves to create images of your heart. It provides your doctor with information about the size and shape of your heart and how well your heart's chambers and valves are working. This procedure takes approximately one hour. There are no restrictions for this procedure. Please do NOT wear cologne, perfume, aftershave, or lotions (deodorant is allowed). Please arrive 15 minutes prior to your appointment time.  Your provider has recommended that  you wear a Zio Patch for 14 days.  This monitor will record your heart rhythm for our review.  IF you have any symptoms while wearing the monitor please press the button.  If you have any issues with the patch or you notice a red or orange light on it please call the company at [phone].  Once you remove the patch please mail it back to the company as soon as possible so we can get the results.  If you have any questions or concerns before your next appointment please send [place] a message through [place] or call our office at [phone].    TO LEAVE A MESSAGE FOR THE NURSE SELECT OPTION 2, PLEASE LEAVE A MESSAGE INCLUDING: YOUR NAME DATE OF BIRTH CALL BACK NUMBER REASON FOR CALL**this is important as we prioritize the call backs  YOU WILL RECEIVE A CALL BACK THE SAME DAY AS LONG AS YOU CALL BEFORE 4:00 PM   Do the following things EVERYDAY: Weigh yourself in the morning before breakfast. Write it down and keep it in a log. Take your medicines as prescribed Eat low salt  foods--Limit salt (sodium) to 2000 mg per day.  Stay as active as you can everyday Limit all fluids for the day to less than 2 liters   At the [facility], you and your health needs are our priority. As part of our continuing mission to provide you with exceptional heart care, we have created designated Provider Care Teams. These Care Teams include your primary Cardiologist (physician) and Advanced Practice Providers (APPs- Physician Assistants and Nurse Practitioners) who all work together to provide you with the care you need, when you need it.   You may see any of the following providers on your designated Care Team at your next follow up: Dr Glori Bickers Dr Haynes Kerns, NP Lyda Jester, [place] Audry Riles, PharmD   Please be sure to bring in all your medications bottles to every appointment.

## 2022-04-18 NOTE — Progress Notes (Signed)
Zio patch placed onto patient.  All instructions and information reviewed with patient, they verbalize understanding with no questions. 

## 2022-04-18 NOTE — Addendum Note (Signed)
Encounter addended by: Rockwell Alexandria, CMA on: 04/18/2022 3:35 PM  Actions taken: Imaging Exam begun

## 2022-04-18 NOTE — Addendum Note (Signed)
Encounter addended by: Rockwell Alexandria, CMA on: 04/18/2022 3:33 PM  Actions taken: Imaging Exam begun

## 2022-04-18 NOTE — Addendum Note (Signed)
Encounter addended by: Rockwell Alexandria, CMA on: 04/18/2022 3:39 PM  Actions taken: Imaging Exam begun

## 2022-04-22 ENCOUNTER — Other Ambulatory Visit: Payer: Self-pay | Admitting: Cardiology

## 2022-04-26 ENCOUNTER — Telehealth: Payer: Self-pay | Admitting: Internal Medicine

## 2022-04-26 ENCOUNTER — Other Ambulatory Visit: Payer: Self-pay | Admitting: Cardiology

## 2022-04-26 DIAGNOSIS — E785 Hyperlipidemia, unspecified: Secondary | ICD-10-CM

## 2022-04-26 NOTE — Progress Notes (Signed)
  Chronic Care Management   Note  04/26/2022 Name: Tyler Berger MRN: [id number] DOB: [birth year]  Tyler Berger is a 62 y.o. year old male who is a primary care patient of Unk Pinto, MD. I reached out to Cherrie Distance by phone today in response to a referral sent by Mr. Cavion Ridley [place]'s PCP, Unk Pinto, MD.   Mr. Barraza was given information about Chronic Care Management services today including:  CCM service includes personalized support from designated clinical staff supervised by his physician, including individualized plan of care and coordination with other care providers 24/7 contact phone numbers for assistance for urgent and routine care needs. Service will only be billed when office clinical staff spend 20 minutes or more in a month to coordinate care. Only one practitioner may furnish and bill the service in a calendar month. The patient may stop CCM services at any time (effective at the end of the month) by phone call to the office staff.   Patient agreed to services and verbal consent obtained.   Follow up plan:   [place] Secretary/administrator

## 2022-04-29 ENCOUNTER — Other Ambulatory Visit (HOSPITAL_COMMUNITY): Payer: Self-pay

## 2022-04-29 ENCOUNTER — Other Ambulatory Visit (HOSPITAL_COMMUNITY): Payer: Self-pay | Admitting: Cardiology

## 2022-04-29 MED ORDER — EMPAGLIFLOZIN 10 MG PO TABS
10.0000 mg | ORAL_TABLET | Freq: Every day | ORAL | 0 refills | Status: DC
  Filled 2022-04-29: qty 90, 90d supply, fill #0

## 2022-05-10 ENCOUNTER — Telehealth: Payer: Self-pay

## 2022-05-10 ENCOUNTER — Telehealth (HOSPITAL_COMMUNITY): Payer: Self-pay

## 2022-05-10 DIAGNOSIS — I493 Ventricular premature depolarization: Secondary | ICD-10-CM | POA: Diagnosis not present

## 2022-05-10 NOTE — Telephone Encounter (Signed)
Zio report has been posted, however, they called with abnormal results.  Patient had SVT event of 197 beats per minute for 60 seconds on 04/23/22. They wanted to make sure to report.

## 2022-05-10 NOTE — Progress Notes (Unsigned)
Date:  05/10/22             Patient Name : Tyler Berger, Tyler Berger           DOB: July 19, 2060 Review office visits, consults, Medication changes and Chronic conditions for Initial CCM visit with CPP on 05/17/22.   PCP- 12/28/21 Carlye Grippe, NP (PCP) - BP 106/68 HR 72. Stop : Alprazolam .5mg    Specialist- 04/18/22 Dr. Aundra Dubin (Cardio) - BP 104/68 HR 73. no med changes   Hospital visits in the last 6 months? None  Time Spent: 35 min HC - Shon Hough [phone]   05/12/22 10:35AM HC called pt to confirm initial CCM visit with CPP. [place] Total time: 43min  Pre Call Questions: Date: Time: Outcome: Successful/ Unsuccessful Confirmed appointment date/time with patient/caregiver? Date/time of the appointment: Visit type: Patient instructed to bring medications to appointment: Yes/No What, if any, problems do you have getting your medications from the pharmacy?   What is your top health concern to discuss at your upcoming visit?

## 2022-05-10 NOTE — Addendum Note (Signed)
Encounter addended by: Micki Riley, RN on: 05/10/2022 9:45 AM  Actions taken: Imaging Exam ended

## 2022-05-11 ENCOUNTER — Ambulatory Visit (HOSPITAL_COMMUNITY)
Admission: RE | Admit: 2022-05-11 | Discharge: 2022-05-11 | Disposition: A | Discharge status: Home / Self Care | Payer: Medicare Other | Source: Ambulatory Visit | Attending: Family Medicine | Admitting: Family Medicine

## 2022-05-11 DIAGNOSIS — E669 Obesity, unspecified: Secondary | ICD-10-CM | POA: Diagnosis not present

## 2022-05-11 DIAGNOSIS — I447 Left bundle-branch block, unspecified: Secondary | ICD-10-CM | POA: Insufficient documentation

## 2022-05-11 DIAGNOSIS — Z953 Presence of xenogenic heart valve: Secondary | ICD-10-CM | POA: Diagnosis not present

## 2022-05-11 DIAGNOSIS — E785 Hyperlipidemia, unspecified: Secondary | ICD-10-CM | POA: Diagnosis not present

## 2022-05-11 DIAGNOSIS — I10 Essential (primary) hypertension: Secondary | ICD-10-CM | POA: Insufficient documentation

## 2022-05-11 DIAGNOSIS — I359 Nonrheumatic aortic valve disorder, unspecified: Secondary | ICD-10-CM | POA: Diagnosis not present

## 2022-05-11 LAB — ECHOCARDIOGRAM COMPLETE
AV Mean grad: 12 mmHg
AV Peak grad: 21.2 mmHg
Ao pk vel: 2.3 m/s
Area-P 1/2: 4.1 cm2
Calc EF: 58.9 %
S' Lateral: 3 cm
Single Plane A2C EF: 58.9 %
Single Plane A4C EF: 57.8 %

## 2022-05-17 ENCOUNTER — Telehealth: Payer: Medicare Other | Admitting: Pharmacist

## 2022-05-20 ENCOUNTER — Other Ambulatory Visit: Payer: Self-pay | Admitting: Cardiology

## 2022-05-23 ENCOUNTER — Other Ambulatory Visit: Payer: Self-pay | Admitting: Nurse Practitioner

## 2022-05-23 DIAGNOSIS — F419 Anxiety disorder, unspecified: Secondary | ICD-10-CM

## 2022-05-24 ENCOUNTER — Telehealth: Payer: Self-pay | Admitting: Pharmacist

## 2022-05-24 NOTE — Progress Notes (Signed)
HC called patient to reschedule initial CP visit from 05/17/22, lvmtrc. Time ( )  06/02/22- Will try to connect with pt again at the end of July to reschedule initial, due to no CP available until that time. BP

## 2022-06-20 ENCOUNTER — Other Ambulatory Visit: Payer: Self-pay | Admitting: Cardiology

## 2022-06-20 MED ORDER — MEXILETINE HCL 150 MG PO CAPS: 300.0000 mg | ORAL_CAPSULE | Freq: Two times a day (BID) | ORAL | 0 refills | Status: DC

## 2022-07-04 ENCOUNTER — Other Ambulatory Visit (HOSPITAL_COMMUNITY): Payer: Self-pay | Admitting: Cardiology

## 2022-07-05 ENCOUNTER — Other Ambulatory Visit (HOSPITAL_COMMUNITY): Payer: Self-pay

## 2022-07-05 MED ORDER — METOPROLOL SUCCINATE ER 100 MG PO TB24
100.0000 mg | ORAL_TABLET | Freq: Two times a day (BID) | ORAL | 3 refills | Status: DC
  Filled 2022-07-05: qty 180, 90d supply, fill #0
  Filled 2022-09-29: qty 180, 90d supply, fill #1
  Filled 2023-01-09: qty 180, 90d supply, fill #2
  Filled 2023-03-29: qty 180, 90d supply, fill #3

## 2022-07-05 MED ORDER — ENTRESTO 97-103 MG PO TABS
1.0000 | ORAL_TABLET | Freq: Two times a day (BID) | ORAL | 2 refills | Status: DC
  Filled 2022-07-05: qty 180, 90d supply, fill #0
  Filled 2022-09-29: qty 180, 90d supply, fill #1
  Filled 2023-01-09: qty 180, 90d supply, fill #2

## 2022-07-07 ENCOUNTER — Ambulatory Visit: Payer: Medicare Other | Admitting: Nurse Practitioner

## 2022-07-11 NOTE — Progress Notes (Deleted)
Patient ID: Tyler Berger, male   DOB: 11/06/1960, 62 y.o.   MRN: [id number]  3 MONTH FOLLOW UP  Assessment and Plan:   Essential hypertension Continue medications Monitor blood pressure at home; call if consistently over 130/80 Continue DASH diet.   Reminder to go to the ER if any CP, SOB, nausea, dizziness, severe HA, changes vision/speech, left arm numbness and tingling and jaw pain. - CBC  Chronic combined systolic and diastolic congestive heart failure [name]) Cardiology managing Continue to maximize medications/blood pressure Excellent progress with weight loss commended- continue Weight daily and bring log of weight and BP to heart failure clinic  Non-ischemic cardiomyopathy (HCC) Try to maximize medications/blood pressure Excellent progress with weight loss - commended,  WEIGH DAILY and bring log of weight and BP to heart failure clinic  SVT (supraventricular tachycardia) (HCC) NSR at this time, remains controlled with meds; cardiology following   PVC (premature ventricular contraction) Continue on mexiletine , metoprolol Cardiology following  S/P aortic valve replacement with bioprosthetic valve + resection ascending thoracic aortic aneurysm Continue cardiology follow up  Hyperlipidemia, unspecified hyperlipidemia type Continue rosuvastatin; defer lipids to cardiology who has been checking, no insurance, has upcoming planning decrease fatty foods increase activity.  - CMP - Lipid panel - TSH  Elevated Hemoglobin - CBC  Abnormal glucose (prediabetes) Discussed disease and risks; on jardiance for heart/sugars  Discussed diet/exercise, weight management  Given glucometer sample and strips, can check PRN fasting  Fasting has been well controlled Weight down 32 lb today from last with carbohydrate reduction - CMP  Anxiety Continue zoloft, buspar and xanax AS needed Has done well reducing xanax use Financial stress significant - pending disability  Stress  management techniques discussed, increase water, good sleep hygiene discussed, increase exercise, and increase veggies.   Vitamin D deficiency Continue supplement   Former smoker Quit smoking 2 years ago, congratulated the patient and encouraged him to continue.  30 pack year; Will schedule at next visit, has upcoming colonoscopy he wants to get through first   Continue diet and meds as discussed. Further disposition pending results of labs. Future Appointments  Date Time Provider Department Center  07/12/2022  3:30 PM Raynelle Dick, NP [facility] None  07/25/2022  3:35 PM Sheilah Pigeon, PA-C [facility]  01/02/2023  3:00 PM Raynelle Dick, NP [facility] None    HPI 62 y.o. male  presents for 6 month follow up on CHF, nonischemic cardiomyopathy, htn, hyperlipidemia, prediabetes, obesity, vit D def.   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 Currently on medicare   Patient is s/p aortic valve replacement with biosynthetic valve and thoracic aortic aneurysm repair on 04/10/2018 by Dr. Cornelius Moras, has systolic heart failure with post op TEE showing EF 35-40%. He was placed on amiodarone due to PVCs with improvement, then transitioned to mexiletine, toprol, entresto. Following with Dr. Shirlee Latch at the heart failure clinic. Echo 11/2019 showed EF up to 50-55%, normally functioning valve, grade 1 diastolic dysfunction. Mod nonobstructive CAD per 03/2018 cath. Zio patch in 4/21 showed PVC burden down to 3.4%. He has been stable generally.  He has depression/anxiety, he lost his best friend in 2020 to massive MI, stress r/t disability and finances. He is on zoloft 100 mg, buspar 10 mg TID which has helped, xanax 0.25 mg occasionally in the evenings, no longer taking daily. Formerly  on wellbutrin, was stopped due to ? Seizure at [employer].  Feels fairly controlled.  BMI is There is no height or weight on file to calculate BMI., he is working on diet and exercise.,  4-5 bottles a day of water . Down 9 pounds since last visit at our office in 12/2020. He is doing more exercise- cardio and weights Wt Readings from Last 3 Encounters:  04/18/22 252 lb 6.4 oz (114.5 kg)  12/28/21 247 lb 12.8 oz (112.4 kg)  07/08/21 233 lb 6.4 oz (105.9 kg)   His blood pressure has been controlled at home, today their BP is   BP Readings from Last 3 Encounters:  04/18/22 104/68  12/28/21 106/68  07/08/21 106/62     He does workout, walking. He denies chest pain, shortness of breath, dizziness, edema, PND.   He is on cholesterol medication, crestor 40 mg daily, zetia 10 mg daily and denies myalgias. His cholesterol is at goal less than 70. The cholesterol last visit was:   Lab Results  Component Value Date   CHOL 138 12/28/2021   HDL 65 12/28/2021   LDLCALC 54 12/28/2021   TRIG 108 12/28/2021   CHOLHDL 2.1 12/28/2021    He has been working on diet and exercise for prediabetes, and denies increased appetite, nausea, paresthesia of the feet, polydipsia, polyuria, visual disturbances, vomiting and weight loss.  On Jardiance via cardiology for heart -  Doesn't have glucometer but would like one - sample given due to no insurance  Last A1C in the office was:  Lab Results  Component Value Date   HGBA1C 6.1 (H) 12/28/2021    Patient is on Vitamin D supplement.   Lab Results  Component Value Date   VD25OH 81 12/28/2021      Current Medications:   Current Outpatient Medications (Endocrine & Metabolic):    empagliflozin (JARDIANCE) 10 MG TABS tablet, Take 1 tablet (10 mg total) by mouth daily. NEEDS FOLLOW UP APPOINTMENT FOR ANYMORE REFILLS  Current Outpatient Medications (Cardiovascular):    eplerenone (INSPRA) 50 MG tablet, Take 1 tablet by mouth once daily   ezetimibe (ZETIA) 10 MG tablet, Take 1 tablet (10 mg total) by mouth daily.    furosemide (LASIX) 40 MG tablet, Take 1 tablet (40 mg total) by mouth daily.   metoprolol succinate (TOPROL-XL) 100 MG 24 hr tablet, Take 1 tablet (100 mg total) by mouth 2 (two) times daily in the morning and at bedtime. Take with or immediately following a meal.   mexiletine (MEXITIL) 150 MG capsule, Take 2 capsules (300 mg total) by mouth 2 (two) times daily.   rosuvastatin (CRESTOR) 40 MG tablet, Take 1 tablet (40 mg total) by mouth daily.   sacubitril-valsartan (ENTRESTO) 97-103 MG, Take 1 tablet by mouth 2 (two) times daily.  Current Outpatient Medications (Respiratory):    cetirizine (ZYRTEC) 10 MG tablet, Take 10 mg by mouth daily.  Current Outpatient Medications (Analgesics):    acetaminophen (TYLENOL) 500 MG tablet, Take 1,000 mg by mouth every 6 (six) hours as needed for mild pain or headache.   aspirin EC 81 MG tablet, Take 81 mg by mouth daily. Swallow whole.   Current Outpatient Medications (Other):    busPIRone (BUSPAR) 10 MG tablet, TAKE 1 TABLET BY MOUTH THREE TIMES DAILY FOR  CHRONIC  ANXIETY  AND  MOOD   Cholecalciferol (VITAMIN D3) 125 MCG (5000 UT) TABS, Take 5,000 Units by mouth daily.    pantoprazole (PROTONIX) 40 MG tablet, Take 1 tablet (  40 mg total) by mouth daily.   potassium chloride (KLOR-CON) 10 MEQ tablet, Take 1 tablet by mouth once daily   sertraline (ZOLOFT) 100 MG tablet, Take 1 tablet (100 mg total) by mouth daily.  Medical History:  Past Medical History:  Diagnosis Date   Anxiety    on meds   Aortic root aneurysm (HCC)    Aortic stenosis    Ascending aortic aneurysm (HCC)    CAD (coronary artery disease)    Chronic combined systolic and diastolic congestive heart failure (HCC)    on meds   Depression    on meds   Diabetes mellitus without complication (HCC)    on meds   Dilated aortic root (HCC)    Hyperlipidemia    on meds   Hypertension    on meds   Non-ischemic cardiomyopathy (HCC)    Nonischemic cardiomyopathy (HCC)    S/P aortic  valve replacement with bioprosthetic valve 04/10/2018   29 mm Edwards Inspiris Resilia stented bovine pericardial tissue valve   S/P ascending aortic aneurysm repair 04/10/2018   26 mm Hemashield Platinum supracoronary straight graft   Seasonal allergies    SVT (supraventricular tachycardia)    Viral myocarditis 2004   Vitamin D deficiency    Allergies No Known Allergies   SURGICAL HISTORY He  has a past surgical history that includes Orchiectomy (Left, 02/08/1980); Varicose vein surgery (Left, 05/09/1998); RIGHT HEART CATH AND CORONARY ANGIOGRAPHY (N/A, 03/28/2018); Multiple extractions with alveoloplasty (N/A, 04/05/2018); Ascending aortic root replacement (N/A, 04/10/2018); TEE without cardioversion (N/A, 04/10/2018); Aortic valve replacement (N/A, 04/10/2018); and Inguinal hernia repair (Left). FAMILY HISTORY His family history includes Alzheimer's disease in his mother; Breast cancer (age of onset: 75) in his mother; Cancer in his father; Colon cancer (age of onset: 29) in his maternal grandmother; Colon polyps in his father; Colon polyps (age of onset: 44) in his maternal grandmother; Diabetes in his daughter, mother, and son; Esophageal cancer (age of onset: 33) in his father; Hypertension in his father, mother, and sister. SOCIAL HISTORY He  reports that he quit smoking about 4 years ago. His smoking use included cigarettes. He started smoking about 44 years ago. He has a 30.00 pack-year smoking history. He has never used smokeless tobacco. He reports that he does not currently use alcohol after a past usage of about 8.0 standard drinks of alcohol per week. He reports that he does not use drugs.   Review of Systems  Constitutional:  Positive for malaise/fatigue. Negative for weight loss.  HENT:  Negative for hearing loss and tinnitus.   Eyes:  Negative for blurred vision and double vision.  Respiratory:  Negative for cough, shortness of breath and wheezing.   Cardiovascular:   Negative for chest pain, palpitations, orthopnea, claudication and leg swelling.  Gastrointestinal:  Negative for abdominal pain, blood in stool, constipation, diarrhea, heartburn, melena, nausea and vomiting.  Genitourinary: Negative.   Musculoskeletal:  Negative for joint pain and myalgias.  Skin:  Negative for rash.  Neurological:  Negative for dizziness, tingling, sensory change, weakness and headaches.  Endo/Heme/Allergies:  Negative for polydipsia.  Psychiatric/Behavioral:  Negative for depression, substance abuse and suicidal ideas. The patient is nervous/anxious and has insomnia.   All other systems reviewed and are negative.   Physical Exam: There were no vitals taken for this visit. Wt Readings from Last 3 Encounters:  04/18/22 252 lb 6.4 oz (114.5 kg)  12/28/21 247 lb 12.8 oz (112.4 kg)  07/08/21 233 lb 6.4 oz (105.9 kg)  There is no height or weight on file to calculate BMI.  General Appearance: Well nourished, well dressed male in no acute distress. Eyes: PERRLA, EOMs, conjunctiva no swelling or erythema Sinuses: No Frontal/maxillary tenderness ENT/Mouth: Ext aud canals clear, TMs without erythema, bulging. No erythema, swelling, or exudate on post pharynx.  Tonsils not swollen or erythematous. Hearing normal.  Neck: Supple, thyroid normal.  Respiratory: Respiratory effort normal, BS equal bilaterally without rales, rhonchi, wheezing or stridor.  Cardio: Regular rate and rhythm, no murmurs, rubs or gallops.  Brisk peripheral pulses without significant edmea;  large torturous varicose veins bilateral legs, non-tender.  Abdomen: Soft, + BS.  Non tender, no guarding, rebound, hernias, masses. Lymphatics: Non tender without lymphadenopathy.  Musculoskeletal: Full ROM, 5/5 strength, normal gait.  Skin: Warm, dry without rashes, lesions, ecchymosis.  Neuro: Cranial nerves intact. Normal muscle tone, no cerebellar symptoms.  Psych: Awake and oriented X 3, mildly anxious affect,  Insight and Judgment appropriate.    Tyler Berger  11:37 AM

## 2022-07-12 ENCOUNTER — Ambulatory Visit: Payer: Medicare Other | Admitting: Nurse Practitioner

## 2022-07-12 DIAGNOSIS — Z953 Presence of xenogenic heart valve: Secondary | ICD-10-CM

## 2022-07-12 DIAGNOSIS — I10 Essential (primary) hypertension: Secondary | ICD-10-CM

## 2022-07-12 DIAGNOSIS — R7309 Other abnormal glucose: Secondary | ICD-10-CM

## 2022-07-12 DIAGNOSIS — F419 Anxiety disorder, unspecified: Secondary | ICD-10-CM

## 2022-07-12 DIAGNOSIS — D582 Other hemoglobinopathies: Secondary | ICD-10-CM

## 2022-07-12 DIAGNOSIS — E782 Mixed hyperlipidemia: Secondary | ICD-10-CM

## 2022-07-12 DIAGNOSIS — Z87891 Personal history of nicotine dependence: Secondary | ICD-10-CM

## 2022-07-12 DIAGNOSIS — R809 Proteinuria, unspecified: Secondary | ICD-10-CM

## 2022-07-12 DIAGNOSIS — I5042 Chronic combined systolic (congestive) and diastolic (congestive) heart failure: Secondary | ICD-10-CM

## 2022-07-12 DIAGNOSIS — I428 Other cardiomyopathies: Secondary | ICD-10-CM

## 2022-07-12 DIAGNOSIS — I77810 Thoracic aortic ectasia: Secondary | ICD-10-CM

## 2022-07-12 DIAGNOSIS — I700 Atherosclerosis of aorta: Secondary | ICD-10-CM

## 2022-07-12 DIAGNOSIS — E559 Vitamin D deficiency, unspecified: Secondary | ICD-10-CM

## 2022-07-12 DIAGNOSIS — I493 Ventricular premature depolarization: Secondary | ICD-10-CM

## 2022-07-12 DIAGNOSIS — Z79899 Other long term (current) drug therapy: Secondary | ICD-10-CM

## 2022-07-12 DIAGNOSIS — I471 Supraventricular tachycardia, unspecified: Secondary | ICD-10-CM

## 2022-07-23 NOTE — Progress Notes (Unsigned)
Opened in Error.

## 2022-07-24 NOTE — Progress Notes (Unsigned)
  Cardiology Office Note:  .   Date:  07/25/2022  ID:  Shelbie Proctor, DOB [birth year], MRN [id number] PCP: Lucky Cowboy, MD   HeartCare Providers Cardiologist:  Rollene Rotunda, MD Electrophysiologist:  Will Jorja Loa, MD     History of Present Illness: Marland Kitchen   Tyler Berger is a 62 y.o. male with PMH of ETOH/tobacco abuse, HTN, mild CAD, NICM, SVT, PVC's, ascending aneurysm/VHD (bicuspid AV) with AS s/p aneurysmal repair and bioprosthetic AVR (2020), LBBB.  For his PVC's/NSVT he was started on amiodarone in 04/2018 PVC burden ranged from 3.4% on amiodarone to 15%. However, given his age, he was changed to mexiletine in 03/2019. At his visit in 07/2021, he was noted to have a slight increase in his PVC burden but was doing well overall. He is followed in the Advanced [facility] Failure Clinic and recently had a follow up ECHO with stable LVEF%, long term monitor showed 8.1% PVC's which was lower than past reading.  Returns 6/17 for routine follow up. Patient reports he is doing really well. He is going to the gym working out 3x/week on the bike for 15 min, then treadmill for 15 min.  Reports his weight is stable.  No significant increase in LE edema. No activity limitation on daily activity.  Compliant with medication regimen. He states he is unaware of an PVC activity, asymptomatic. Drinks 1-2 beers per day, no smoking, uses tanning bed after working out.   ROS: Denies chest pain, SOB, syncope, dizziness, lightheadedness. Otherwise negative unless mentioned in HPI.   EP Information / Studies Reviewed: .    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, non-obs CAD ECHO 05/11/22 >  LVEF 55-60%, no RWMA, mild LVH, Grade I DD, no AVR/no stenosis, AVR with normal structure & function  LT Monitor 05/2022 > 8.1% PVC burden  AAD Hx  Amiodarone from 04/2018 > 03/2019, stopped  due to age, started on Mexiletine 03/2019      Physical Exam:   VS:  BP 134/76   Pulse 75   Ht 6\' 3"  (1.905 m)   Wt 249 lb (112.9 kg)   SpO2 95%   BMI 31.12 kg/m    Wt Readings from Last 3 Encounters:  07/25/22 249 lb (112.9 kg)  04/18/22 252 lb 6.4 oz (114.5 kg)  12/28/21 247 lb 12.8 oz (112.4 kg)    GEN: Well nourished, well developed in no acute distress NECK: No JVD; No carotid bruits CARDIAC: S1S2 irregular, no murmurs, rubs, gallops RESPIRATORY:  Clear to auscultation without rales, wheezing or rhonchi  ABDOMEN: Soft, non-tender, non-distended EXTREMITIES:  No edema; No deformity   ASSESSMENT AND PLAN: .    NSVT / PVC's  LVEF 55-60% (from 20-25% in 03/2018) -continue mexiletine 300 mg BID  -no change to dosing, monitor in April noted with 8.1% PVC burden  NICM  Recovered EF to 55-60% -volume status euvolemic  -follows with advanced HF team   HTN  -BP stable / well controlled    Aortic Aneurysm s/p Repair & Bioprosthetic AVR  -ECHO 05/2022 shows normal valvular function         Dispo: Follow up with Dr. Elberta Fortis in 4 months  Signed, Canary Brim, MSN, APRN, NP-C, AGACNP-BC [facility] - Electrophysiology  07/25/2022, 4:20 PM

## 2022-07-25 ENCOUNTER — Encounter: Payer: Self-pay | Admitting: Pulmonary Disease

## 2022-07-25 ENCOUNTER — Ambulatory Visit: Payer: Medicare Other | Attending: Physician Assistant | Admitting: Pulmonary Disease

## 2022-07-25 VITALS — BP 134/76 | HR 75 | Ht 75.0 in | Wt 249.0 lb

## 2022-07-25 DIAGNOSIS — I428 Other cardiomyopathies: Secondary | ICD-10-CM

## 2022-07-25 DIAGNOSIS — I10 Essential (primary) hypertension: Secondary | ICD-10-CM | POA: Diagnosis not present

## 2022-07-25 DIAGNOSIS — I5042 Chronic combined systolic (congestive) and diastolic (congestive) heart failure: Secondary | ICD-10-CM | POA: Diagnosis not present

## 2022-07-25 DIAGNOSIS — I359 Nonrheumatic aortic valve disorder, unspecified: Secondary | ICD-10-CM

## 2022-07-25 DIAGNOSIS — I493 Ventricular premature depolarization: Secondary | ICD-10-CM | POA: Diagnosis not present

## 2022-07-25 NOTE — Patient Instructions (Signed)
Medication Instructions:   Your physician recommends that you continue on your current medications as directed. Please refer to the Current Medication list given to you today.  *If you need a refill on your cardiac medications before your next appointment, please call your pharmacy*   Lab Work:  NONE ORDERED  TODAY   If you have labs (blood work) drawn today and your tests are completely normal, you will receive your results only by: MyChart Message (if you have MyChart) OR A paper copy in the mail If you have any lab test that is abnormal or we need to change your treatment, we will call you to review the results.   Testing/Procedures:  NONE ORDERED  TODAY     Follow-Up: At [facility], you and your health needs are our priority.  As part of our continuing mission to provide you with exceptional heart care, we have created designated Provider Care Teams.  These Care Teams include your primary Cardiologist (physician) and Advanced Practice Providers (APPs -  Physician Assistants and Nurse Practitioners) who all work together to provide you with the care you need, when you need it.  We recommend signing up for the patient portal called "MyChart".  Sign up information is provided on this After Visit Summary.  MyChart is used to connect with patients for Virtual Visits (Telemedicine).  Patients are able to view lab/test results, encounter notes, upcoming appointments, etc.  Non-urgent messages can be sent to your provider as well.   To learn more about what you can do with MyChart, go to [email or web address].    Your next appointment:   4 month(s)  Provider:   You may see Will Jorja Loa, MD         Other Instructions

## 2022-08-02 ENCOUNTER — Other Ambulatory Visit (HOSPITAL_COMMUNITY): Payer: Self-pay

## 2022-08-02 ENCOUNTER — Other Ambulatory Visit (HOSPITAL_COMMUNITY): Payer: Self-pay | Admitting: Cardiology

## 2022-08-02 MED ORDER — EMPAGLIFLOZIN 10 MG PO TABS
10.0000 mg | ORAL_TABLET | Freq: Every day | ORAL | 0 refills | Status: DC
  Filled 2022-08-02: qty 60, 60d supply, fill #0

## 2022-08-04 ENCOUNTER — Telehealth (HOSPITAL_COMMUNITY): Payer: Self-pay

## 2022-08-04 ENCOUNTER — Other Ambulatory Visit (HOSPITAL_COMMUNITY): Payer: Self-pay

## 2022-08-04 NOTE — Telephone Encounter (Signed)
Advanced Heart Failure Patient Advocate Encounter  The patient was renewed for a [employer] grant that will help cover the cost of Entresto, Eplerenone, Jardiance, Metoprolol.  Total amount awarded, $10,000.  Effective: 07/06/2022 - 07/05/2023.  BIN [id number] PCN [id number] [id number] ID [id number]  Pharmacy provided with approval and processing information. Confirmed $0 copay for Jardiance. Patient informed via phone.  Burnell Blanks, CPhT Rx Patient Advocate Phone: [phone]

## 2022-08-05 ENCOUNTER — Other Ambulatory Visit (HOSPITAL_COMMUNITY): Payer: Self-pay

## 2022-08-22 ENCOUNTER — Other Ambulatory Visit: Payer: Self-pay | Admitting: Cardiology

## 2022-08-23 NOTE — Telephone Encounter (Signed)
 This is a CHF pt 

## 2022-09-02 ENCOUNTER — Other Ambulatory Visit: Payer: Self-pay | Admitting: Nurse Practitioner

## 2022-09-28 ENCOUNTER — Other Ambulatory Visit (HOSPITAL_COMMUNITY): Payer: Self-pay

## 2022-09-28 ENCOUNTER — Other Ambulatory Visit (HOSPITAL_COMMUNITY): Payer: Self-pay | Admitting: Cardiology

## 2022-09-28 MED ORDER — EMPAGLIFLOZIN 10 MG PO TABS
10.0000 mg | ORAL_TABLET | Freq: Every day | ORAL | 0 refills | Status: DC
  Filled 2022-09-28: qty 60, 60d supply, fill #0

## 2022-09-29 ENCOUNTER — Other Ambulatory Visit (HOSPITAL_COMMUNITY): Payer: Self-pay

## 2022-09-29 ENCOUNTER — Other Ambulatory Visit: Payer: Self-pay

## 2022-10-16 ENCOUNTER — Other Ambulatory Visit (HOSPITAL_COMMUNITY): Payer: Self-pay | Admitting: Cardiology

## 2022-11-16 ENCOUNTER — Other Ambulatory Visit (HOSPITAL_COMMUNITY): Payer: Self-pay | Admitting: Cardiology

## 2022-11-24 ENCOUNTER — Ambulatory Visit: Payer: Medicare Other | Admitting: Cardiology

## 2022-11-25 ENCOUNTER — Encounter: Payer: Self-pay | Admitting: Cardiology

## 2022-11-25 NOTE — Telephone Encounter (Signed)
Left message to call back  

## 2022-11-25 NOTE — Telephone Encounter (Unsigned)
Pt reports that Mexiletine is unaffordable.  Says he went to pick it up and it was more expensive than usual. Nothing new w/ insurance and pt has been  taking this medication for years. Pt aware I will reach out to pharmacy for further clarification and will be back in touch. Pt agreeable to plan.

## 2022-11-26 ENCOUNTER — Other Ambulatory Visit: Payer: Self-pay | Admitting: Nurse Practitioner

## 2022-11-28 NOTE — Telephone Encounter (Signed)
Left message to call back  

## 2022-11-28 NOTE — Telephone Encounter (Signed)
Spoke to pharmacy on Friday, 10/18. They informed me that on 7/19 pt filled a 90 day Rx and paid $ 157.68 ([employer]). In May pt filled a 60 day supply which cost him $ 90.00.  This may be where confusion comes from for pt. Left message for pt to call back and discuss.

## 2022-11-28 NOTE — Telephone Encounter (Signed)
Pt reports that he just cannot afford this anymore.  He is on disability and it is too financially hard to continue. Forward to pharmD who is going to look at chart and see if there are any help/cost options for pt.  Pt aware I will follow up with him.

## 2022-11-29 ENCOUNTER — Other Ambulatory Visit (HOSPITAL_COMMUNITY): Payer: Self-pay

## 2022-11-29 ENCOUNTER — Telehealth: Payer: Self-pay | Admitting: Cardiology

## 2022-11-29 NOTE — Telephone Encounter (Signed)
Pt c/o medication issue:  1. Name of Medication:   mexiletine (MEXITIL) 150 MG capsule   2. How are you currently taking this medication (dosage and times per day)? As prescribed  3. Are you having a reaction (difficulty breathing--STAT)?   No  4. What is your medication issue?   Patient stated he can no longer able to afford this medication and is now out of this medication.  Patient wants a call back to get alternate medication and stated he is getting frustrated.

## 2022-11-29 NOTE — Telephone Encounter (Signed)
Left message to call back  

## 2022-11-29 NOTE — Telephone Encounter (Signed)
Patient is returning phone call.  °

## 2022-11-29 NOTE — Telephone Encounter (Signed)
Pt is returning call.  

## 2022-11-29 NOTE — Telephone Encounter (Addendum)
Pt advised to look into [facility] for possible affordable cost. Pt tried, but they do NOT take his insurance.  Will forward to Dr. Elberta Fortis for alternative medication to Mexiletine. (Pt on disability)

## 2022-11-30 ENCOUNTER — Other Ambulatory Visit (HOSPITAL_COMMUNITY): Payer: Self-pay

## 2022-11-30 ENCOUNTER — Telehealth (HOSPITAL_COMMUNITY): Payer: Self-pay | Admitting: Licensed Clinical Social Worker

## 2022-11-30 NOTE — Telephone Encounter (Signed)
H&V Care Navigation CSW Progress Note  Clinical Social Worker received call from pt requesting help with med cost concern for mexilitine.  CSW assisted in looking into options but no patient assistance available.  Pt filling 90 day supply right now and costing over $150- reports if it was only around $50 for 30 day supply he could manage that.  CSW called pharmacy and they changed to 30 day supply- pt will plan to pick up med.   SDOH Screenings   Food Insecurity: No Food Insecurity (09/03/2019)  Housing: Low Risk  (09/03/2019)  Transportation Needs: No Transportation Needs (09/03/2019)  Depression (PHQ2-9): Medium Risk (11/11/2019)  Financial Resource Strain: Medium Risk (09/03/2019)  Tobacco Use: Medium Risk (07/25/2022)   Burna Sis, LCSW Clinical Social Worker Advanced Heart Failure Clinic Desk#: [phone] Cell#: [phone]

## 2022-12-01 ENCOUNTER — Other Ambulatory Visit (HOSPITAL_COMMUNITY): Payer: Self-pay

## 2022-12-01 ENCOUNTER — Other Ambulatory Visit (HOSPITAL_COMMUNITY): Payer: Self-pay | Admitting: Cardiology

## 2022-12-02 ENCOUNTER — Other Ambulatory Visit (HOSPITAL_COMMUNITY): Payer: Self-pay

## 2022-12-02 MED ORDER — EMPAGLIFLOZIN 10 MG PO TABS
10.0000 mg | ORAL_TABLET | Freq: Every day | ORAL | 0 refills | Status: DC
  Filled 2022-12-02: qty 60, 60d supply, fill #0

## 2022-12-05 ENCOUNTER — Other Ambulatory Visit: Payer: Self-pay | Admitting: Nurse Practitioner

## 2022-12-05 DIAGNOSIS — F419 Anxiety disorder, unspecified: Secondary | ICD-10-CM

## 2022-12-06 MED ORDER — BUSPIRONE HCL 10 MG PO TABS
ORAL_TABLET | ORAL | 0 refills | Status: DC
Start: 2022-12-06 — End: 2023-03-01

## 2022-12-07 ENCOUNTER — Telehealth: Payer: Self-pay | Admitting: Cardiology

## 2022-12-07 NOTE — Telephone Encounter (Signed)
Patient states he is returning a call from [place], Charity fundraiser.

## 2022-12-07 NOTE — Telephone Encounter (Signed)
Left detailed message asking pt to call office so that we can verbally speak about this situation.

## 2022-12-07 NOTE — Telephone Encounter (Signed)
Left message to call back  

## 2022-12-07 NOTE — Telephone Encounter (Signed)
Finally able to speak to pt verbally. He confirms that he cannot afford even $50/month for Mexiletine. He needs an alternative, less expensive alternative. Aware forwarding to MD for advisement.

## 2022-12-08 NOTE — Telephone Encounter (Signed)
Spoke to pt yesterday afternoon. He does tell me that he cannot afford to continue Mexiletine. He has enough to get him through till he see Dr. Elberta Fortis in December and will discuss options then. He appreciates my continued efforts on this matter.

## 2022-12-11 ENCOUNTER — Other Ambulatory Visit: Payer: Self-pay | Admitting: Nurse Practitioner

## 2022-12-11 DIAGNOSIS — I428 Other cardiomyopathies: Secondary | ICD-10-CM

## 2022-12-18 ENCOUNTER — Other Ambulatory Visit (HOSPITAL_COMMUNITY): Payer: Self-pay | Admitting: Cardiology

## 2022-12-19 ENCOUNTER — Other Ambulatory Visit (HOSPITAL_COMMUNITY): Payer: Self-pay | Admitting: Cardiology

## 2022-12-19 ENCOUNTER — Other Ambulatory Visit: Payer: Self-pay | Admitting: Nurse Practitioner

## 2022-12-19 DIAGNOSIS — I428 Other cardiomyopathies: Secondary | ICD-10-CM

## 2022-12-26 ENCOUNTER — Encounter (HOSPITAL_COMMUNITY): Payer: Self-pay

## 2022-12-26 ENCOUNTER — Ambulatory Visit (HOSPITAL_COMMUNITY)
Admission: RE | Admit: 2022-12-26 | Discharge: 2022-12-26 | Disposition: A | Discharge status: Home / Self Care | Payer: Medicare Other | Source: Ambulatory Visit | Attending: Family Medicine | Admitting: Family Medicine

## 2022-12-26 ENCOUNTER — Other Ambulatory Visit (HOSPITAL_COMMUNITY): Payer: Self-pay

## 2022-12-26 VITALS — BP 120/80 | HR 77 | Wt 252.2 lb

## 2022-12-26 DIAGNOSIS — I110 Hypertensive heart disease with heart failure: Secondary | ICD-10-CM | POA: Diagnosis not present

## 2022-12-26 DIAGNOSIS — Q2381 Bicuspid aortic valve: Secondary | ICD-10-CM | POA: Diagnosis not present

## 2022-12-26 DIAGNOSIS — I493 Ventricular premature depolarization: Secondary | ICD-10-CM

## 2022-12-26 DIAGNOSIS — I5022 Chronic systolic (congestive) heart failure: Secondary | ICD-10-CM | POA: Insufficient documentation

## 2022-12-26 DIAGNOSIS — I359 Nonrheumatic aortic valve disorder, unspecified: Secondary | ICD-10-CM

## 2022-12-26 DIAGNOSIS — Z953 Presence of xenogenic heart valve: Secondary | ICD-10-CM | POA: Diagnosis not present

## 2022-12-26 DIAGNOSIS — I2510 Atherosclerotic heart disease of native coronary artery without angina pectoris: Secondary | ICD-10-CM | POA: Insufficient documentation

## 2022-12-26 DIAGNOSIS — Z7984 Long term (current) use of oral hypoglycemic drugs: Secondary | ICD-10-CM | POA: Diagnosis not present

## 2022-12-26 DIAGNOSIS — N62 Hypertrophy of breast: Secondary | ICD-10-CM | POA: Diagnosis not present

## 2022-12-26 DIAGNOSIS — F1010 Alcohol abuse, uncomplicated: Secondary | ICD-10-CM | POA: Insufficient documentation

## 2022-12-26 DIAGNOSIS — Z79899 Other long term (current) drug therapy: Secondary | ICD-10-CM | POA: Diagnosis not present

## 2022-12-26 DIAGNOSIS — I447 Left bundle-branch block, unspecified: Secondary | ICD-10-CM | POA: Diagnosis not present

## 2022-12-26 DIAGNOSIS — I428 Other cardiomyopathies: Secondary | ICD-10-CM | POA: Insufficient documentation

## 2022-12-26 DIAGNOSIS — I350 Nonrheumatic aortic (valve) stenosis: Secondary | ICD-10-CM | POA: Insufficient documentation

## 2022-12-26 LAB — COMPREHENSIVE METABOLIC PANEL
ALT: 38 U/L (ref 0–44)
AST: 35 U/L (ref 15–41)
Albumin: 4 g/dL (ref 3.5–5.0)
Alkaline Phosphatase: 54 U/L (ref 38–126)
Anion gap: 12 (ref 5–15)
BUN: 15 mg/dL (ref 8–23)
CO2: 23 mmol/L (ref 22–32)
Calcium: 9.5 mg/dL (ref 8.9–10.3)
Chloride: 101 mmol/L (ref 98–111)
Creatinine, Ser: 1.21 mg/dL (ref 0.61–1.24)
GFR, Estimated: >60 mL/min (ref >60)
Glucose, Bld: 102 mg/dL — ABNORMAL HIGH (ref 70–99)
Potassium: 4.5 mmol/L (ref 3.5–5.1)
Sodium: 136 mmol/L (ref 135–145)
Total Bilirubin: 0.7 mg/dL (ref <1.2)
Total Protein: 7.3 g/dL (ref 6.5–8.1)

## 2022-12-26 LAB — LIPID PANEL
Cholesterol: 130 mg/dL (ref 0–200)
HDL: 54 mg/dL (ref >40)
LDL Cholesterol: 52 mg/dL (ref 0–99)
Total CHOL/HDL Ratio: 2.4 {ratio}
Triglycerides: 121 mg/dL (ref <150)
VLDL: 24 mg/dL (ref 0–40)

## 2022-12-26 NOTE — Progress Notes (Signed)
PCP: Lucky Cowboy, MD HF Cardiology: Dr Shirlee Latch   HPI: 62 y.o. with history of cardiomyopathy and aortic stenosis returns for followup in CHF.  In 2004, he was diagnosed with nonischemic cardiomyopathy, EF 20% at that time.  His EF recovered back up to 60-65%, but he was noted to have moderate aortic stenosis with a bicuspid valve. He was not seen by cardiology for a number of years.    He was in the ER in 1/20 with SVT that converted spontaneously.  He was set up for an echo and sent back to cardiology.  Echo showed EF 25-30%, diffuse hypokinesis, probably severe aortic stenosis with bicuspid aortic valve.  He was referred for RHC/LHC.     RHC/LHC was done 2/20.  He had moderate coronary disease but no critical stenoses and no disease to explain his cardiomyopathy.  RHC showed markedly elevated PCWP, mixed pulmonary venous/pulmonary arterial hypertension, and low CI at 1.7.  I decided to admit him for diuresis, optimization of meds, and further workup of aortic stenosis.  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  He was evaluated by the structural heart team but valve area thought to be too big for the existing TAVR valves.  Therefore, surgical AVR was recommended.    On 04/10/18 he underwent bioprosthetic AVR with supracoronary ascending aorta replacement  by Dr Cornelius Moras.  He was placed on amiodarone due to increased PVCs post-op.  Discharge weight was 224 pounds.   Echo in 8/20 showed EF 40-45% with mild LVH, septal-lateral dyssynchrony, normal bioprosthetic aortic valve.  Zio patch in 12/20 showed 15.9% PVCs and NSVT runs.  Amiodarone was started. He saw Dr. Elberta Fortis and amiodarone was stopped, mexiletine was started.  Zio patch in 4/21 showed PVC burden down to 3.4%.  Echo in 10/21 showed EF 55-60%, normal RV, bioprosthetic aortic valve with mean gradient 10 mmHg.  Zio monitor in 12/22 showed 9.7% PVCs, mexiletine was increased by EP.  Echo 1/23 EF 55-60% with bioprosthetic aortic valve  mean gradient 12 mmHg, normal RV.   Echo 4/24 EF 55-60%, stable aortic valve prosthesis.  Zio 4/24 showed mostly NSR, 8.1% PVCs (lower than in the past)  Today he returns for HF follow up. Overall feeling fine. Works out at [employer] 2x/week, no dyspnea with this. Denies palpitations, CP, dizziness, edema, or PND/Orthopnea. Appetite ok. No fever or chills. Weight at home 252 pounds. Taking all medications. No tobacco use, 1-2 beers/day, no drugs. He does not know if he snores.  ECG (personally reviewed): NSR, IVCD 138 msec, 1 PVC  Labs (8/20): LDL 69, HDL 49, K 3.8, creatinine 9.52  Labs (9/20): K 4.2, creatinine 1.08 Labs (12/20): K 3.4, creatinine 1.22  Labs (3/21): K 4.4, creatinine 1.24 Labs (8/21): K 4.6, creatinine 0.93, LDL 84, HDL 50 Labs (11/22): LDL 64, K 4.3, creatinine 8.41 Labs (11/23): K 4.6, creatinine 0.82, LDL 54, TSH normal Labs (3/24): K 4.4, creatinine 1.06  PMH: 1. SVT 2. HTN 3. Prior ETOH abuse 4. Prior smoking 5. Chronic systolic CHF: Nonischemic cardiomyopathy.  - Echo (2004) with EF 20%.  - Echo (1/20): EF 25-30%, mild LVH, moderate LV dilation, bicuspid aortic valve with severe AS.  - RHC (2/20): mean RA 12, PA 70/32 mean 47, mean PCWP 30, CI 1.72, PVR 4.2.  - Echo (9/20): EF 40-45%, mild LVH, septal-lateral dyssynchrony, normal RV size and systolic function, bioprosthetic AVR functions normally.  - Echo (10/21): EF 55-60%, normal RV, bioprosthetic aortic valve with mean gradient 10 mmHg. -  Echo (1/23): EF 55-60% with bioprosthetic aortic valve mean gradient 12 mmHg, normal RV. - Echo (4/24):  EF 55-60%, stable aortic valve prosthesis with AoV mean gradient 12 mmHg, normal RV 6. LBBB 7. CAD: LHC (2/20) witih 80% stenosis small D1, 50% stenosis moderate D2, 50% mid LCx, 50% distal RCA.  8. Biscuspid aortic valve disorder: Severe AS and dilated ascending aorta.   - 3/20 bioprosthetic AVR + supracoronary ascending aorta replacement.  9. PVCs: Zio patch 12/20  showed 15.9% PVCs, NSVT runs.  - Zio patch (4/21): 3.4% PVCs, multiple morphologies.  - Zio patch (12/22): 9.7% PVCs - Zio patch (4/24): 8.1% PVCs  ROS: All systems negative except as listed in HPI, PMH and Problem List.  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  Two children.     Social Determinants of Health   Financial Resource Strain: Medium Risk (09/03/2019)   Overall Financial Resource Strain (CARDIA)    Difficulty of Paying Living Expenses: Somewhat hard  Food Insecurity: No Food Insecurity (09/03/2019)   Hunger Vital Sign    Worried About Running Out of Food in the Last Year: Never true    Ran Out of Food in the Last Year: Never true  Transportation Needs: No Transportation Needs (09/03/2019)   PRAPARE - Administrator, Civil Service (Medical): No    Lack of Transportation (Non-Medical): No  Physical Activity: Not on file  Stress: Not on file  Social Connections: Not on file  Intimate Partner Violence: Not on file   FH:  Family History  Problem Relation Age of Onset   Diabetes Mother    Hypertension Mother    Alzheimer's disease Mother     Breast cancer Mother 60       breast   Esophageal cancer Father 19   Colon polyps Father    Hypertension Father    Cancer Father        laryngeal, former smoker   Hypertension Sister    Colon polyps Maternal Grandmother 83   Colon cancer Maternal Grandmother 7   Diabetes Daughter    Diabetes Son    Rectal cancer Neg Hx    Stomach cancer Neg Hx    Current Outpatient Medications  Medication Sig Dispense Refill   acetaminophen (TYLENOL) 500 MG tablet Take 1,000 mg by mouth every 6 (six) hours as needed for mild pain or headache.     aspirin EC 81 MG tablet Take 81 mg by mouth daily. Swallow whole.  busPIRone (BUSPAR) 10 MG tablet TAKE 1 TABLET BY MOUTH THREE TIMES DAILY FOR CHRONIC ANXIETY AND MOOD 270 tablet 0   cetirizine (ZYRTEC) 10 MG tablet Take 10 mg by mouth daily.     Cholecalciferol (VITAMIN D3) 125 MCG (5000 UT) TABS Take 5,000 Units by mouth daily.      empagliflozin (JARDIANCE) 10 MG TABS tablet Take 1 tablet (10 mg total) by mouth daily. NEEDS FOLLOW UP APPOINTMENT FOR ANYMORE REFILLS 60 tablet 0   eplerenone (INSPRA) 50 MG tablet Take 1 tablet by mouth once daily 30 tablet 0   ezetimibe (ZETIA) 10 MG tablet Take 1 tablet (10 mg total) by mouth daily. 90 tablet 3   furosemide (LASIX) 40 MG tablet Take 1 tablet by mouth once daily 30 tablet 0   metoprolol succinate (TOPROL-XL) 100 MG 24 hr tablet Take 1 tablet (100 mg total) by mouth 2 (two) times daily in the morning and at bedtime. Take with or immediately following a meal. 180 tablet 3   mexiletine (MEXITIL) 150 MG capsule Take 2 capsules by mouth twice daily 360 capsule 3   pantoprazole (PROTONIX) 40 MG tablet Take 1 tablet by mouth once daily 30 tablet 0   potassium chloride (KLOR-CON) 10 MEQ tablet Take 1 tablet by mouth once daily 90 tablet 3   rosuvastatin (CRESTOR) 40 MG tablet Take 1 tablet (40 mg total) by mouth daily. 30 tablet 11   sacubitril-valsartan (ENTRESTO) 97-103 MG Take 1 tablet by mouth 2 (two) times  daily. 180 tablet 2   sertraline (ZOLOFT) 100 MG tablet Take 1 tablet by mouth once daily 90 tablet 2   No current facility-administered medications for this encounter.   BP 120/80   Pulse 77   Wt 114.4 kg (252 lb 3.2 oz)   SpO2 94%   BMI 31.52 kg/m   Wt Readings from Last 3 Encounters:  12/26/22 114.4 kg (252 lb 3.2 oz)  07/25/22 112.9 kg (249 lb)  04/18/22 114.5 kg (252 lb 6.4 oz)   PHYSICAL EXAM: General:  NAD. No resp difficulty, walked into clinic HEENT: Normal Neck: Supple. No JVD. Carotids 2+ bilat; no bruits. No lymphadenopathy or thryomegaly appreciated. Cor: PMI nondisplaced. Irregular rate (PVC) & rhythm. No rubs, gallops or murmurs. Lungs: Clear Abdomen: Soft, nontender, nondistended. No hepatosplenomegaly. No bruits or masses. Good bowel sounds. Extremities: No cyanosis, clubbing, rash, edema Neuro: Alert & oriented x 3, cranial nerves grossly intact. Moves all 4 extremities w/o difficulty. Affect pleasant.  ASSESSMENT & PLAN: 1. Chronic systolic CHF: Nonischemic cardiomyopathy. This pre-existed severe AS, ?if PVCs play a role.  Echo in 9/20 showed EF up to 40-45%, and echo 1/23 showed EF 55-60%.  Echo 4/24 showed EF 55-60%, stable aortic valve prosthesis. He is not volume overloaded on exam, NYHA class I.  - Continue Entresto 97/103 bid. BMET today.  - Continue Toprol XL 100 mg bid.   - Continue eplerenone 50 mg daily (painful gynecomastia with spironolactone, now resolved).   - Continue Lasix 40 mg daily.  Continue KCl 10 daily.    - Continue empagliflozin 10 mg daily. No GU symptoms. - EF is now out of ICD range.   2. Bicuspid aortic valve disorder: Severe AS, now s/p surgical AVR (annulus too large for TAVR) and ascending aorta replacement.  Echo 1/23 and 4/24 showed a normally functioning bioprosthetic aortic valve.  - He will need endocarditis prophylaxis with dental work.  3. ETOH abuse: Discussed cutting back.  4. PVCs: 15.9% PVCs on  12/20 Zio patch  (symptomatic).  Amiodarone started with improvement.  To prevent long-term amiodarone use, he was transitioned to mexiletine. Repeat Zio patch in 4/21 showed PVC burden down to 3.4%.  Multiple morphologies of PVCs, would not be amenable to ablation.  Zio in 12/22 showed 9.7% PVCs => EP increased dose of mexiletine.  Zio 4/24 showed 8.1% PVC burden. He denies palpitations. 1 PVC on ECG today. - Continue mexiletine 300 mg bid.  - Cut back on ETOH. 5. CAD: Moderate nonobstructive CAD on 2/20 cath.  No chest pain.  I do not think that this contributed to his cardiomyopathy appreciably.  - Continue Crestor, good lipids in 12/23. Repeat lipids today - Continue ASA.   6. LBBB-like IVCD: Chronic.   Follow up in 6 months with Dr. Shirlee Latch.  Anderson [facility] FNP-BC 12/26/2022

## 2022-12-26 NOTE — Patient Instructions (Addendum)
Thank you for coming in today  If you had labs drawn today, any labs that are abnormal the clinic will call you No news is good news  Medications: No changes  Follow up appointments:  Your physician recommends that you schedule a follow-up appointment in:  6 months With Dr. Earlean Shawl will receive a reminder letter in the mail a few months in advance. If you don't receive a letter, please call our office to schedule the follow-up appointment.    Do the following things EVERYDAY: Weigh yourself in the morning before breakfast. Write it down and keep it in a log. Take your medicines as prescribed Eat low salt foods--Limit salt (sodium) to 2000 mg per day.  Stay as active as you can everyday Limit all fluids for the day to less than 2 liters   At the [facility], you and your health needs are our priority. As part of our continuing mission to provide you with exceptional heart care, we have created designated Provider Care Teams. These Care Teams include your primary Cardiologist (physician) and Advanced Practice Providers (APPs- Physician Assistants and Nurse Practitioners) who all work together to provide you with the care you need, when you need it.   You may see any of the following providers on your designated Care Team at your next follow up: Dr Arvilla Meres Dr Marca Ancona Dr. Marcos Eke, NP Robbie Lis, [place] [facility] [place], [place] Brynda Peon, NP Karle Plumber, PharmD   Please be sure to bring in all your medications bottles to every appointment.    Thank you for choosing [facility]  If you have any questions or concerns before your next appointment please send [place] a message through [place] or call our office at [phone].    TO LEAVE A MESSAGE FOR THE NURSE SELECT OPTION 2, PLEASE LEAVE A MESSAGE INCLUDING: YOUR NAME DATE OF BIRTH CALL BACK NUMBER REASON FOR  CALL**this is important as we prioritize the call backs  YOU WILL RECEIVE A CALL BACK THE SAME DAY AS LONG AS YOU CALL BEFORE 4:00 PM

## 2022-12-30 NOTE — Progress Notes (Deleted)
Patient ID: Tyler Berger, male   DOB: 30-Nov-1960, 62 y.o.   MRN: [id number]  COMPLETE PHYSICAL  Assessment and Plan:   Encounter for general adult medical examination with abnormal findings Patient does not have insurance- will do minimal labs as verified with him   Essential hypertension Continue medications Monitor blood pressure at home; call if consistently over 130/80 Continue DASH diet.   Reminder to go to the ER if any CP, SOB, nausea, dizziness, severe HA, changes vision/speech, left arm numbness and tingling and jaw pain. CBC CMP Microalbumin/creatinine urine ratio  Chronic combined systolic and diastolic congestive heart failure [facility]) May try to maximize medications/blood pressure LONG discussion about sodium/sugar and maximizing medications to help his heart not work as hard.  Goal weight is likely closer to 235-240  Edema has increased, plan to increase Lasix to 40 mg QD, may up potassium supplementation depending on level tomorrow Weight daily and bring log of weight and BP to heart failure clinic Followed by cardiology  Non-ischemic cardiomyopathy (HCC) Try to maximize medications/blood pressure LONG discussion about sodium/sugar and maximizing medications to help his heart not work as hard.   Goal weight is likely closer to 235-240 WEIGH DAILY and bring log of weight and BP to heart failure clinic Strongly encouraged use of compression socks to help with edema  Aortic Atherosclerosis per CT 2019(HCC) Control BP, weight , blood sugar and cholesterol Continue to follow with cardiology  SVT (supraventricular tachycardia) (HCC) Remains controlled with meds; cardiology following   PVC (premature ventricular contraction) NSR at this time, recent Zio with burden down to 3.4% Off of amiodarone; cardiology following  S/P aortic valve replacement with bioprosthetic valve + resection ascending thoracic aortic aneurysm Continue cardiology follow up  Hyperlipidemia,  unspecified hyperlipidemia type Continue rosuvastatin; Continue to follow with cardiology decrease fatty foods increase activity.  Lipid panel  Abnormal glucose (prediabetes) Discussed disease and risks; on jardiance for heart/sugars  Discussed diet/exercise, weight management  Given glucometer sample and strips, can check PRN fasting  If A1C 7+ plan to start metformin -     Hemoglobin A1c  Anxiety Continue zoloft, buspar and xanax AS needed Declines dose change today;  Financial stress significant - pending disability  Stress management techniques discussed, increase water, good sleep hygiene discussed, increase exercise, and increase veggies.   Vitamin D deficiency Continue supplement  - Vit D  Former smoker Quit smoking, congratulated the patient and encouraged him to continue.  30 pack year; once he has insurance will discuss low dose CT; denies concerning sx today   Prostate cancer screening PSA  Screening for hematuria/proteinuria - Routine UA with reflex microscopic - Microalbumin/creatinine urine ratio  Flu vaccine need - Flu Quad 6 MOS + PF IM given  Continue diet and meds as discussed. Further disposition pending results of labs. Future Appointments  Date Time Provider Department Center  01/02/2023  3:00 PM Raynelle Dick, NP [facility] None  01/18/2023  3:30 PM Regan Lemming, MD [facility]  01/03/2024  3:00 PM Raynelle Dick, NP [facility] None    HPI 62 y.o. male  presents for CPE. He has Hyperlipidemia; Hypertension; Anxiety; Vitamin D deficiency; Medication management; Varicose veins of both lower extremities; LBBB (left bundle branch block); SVT (supraventricular tachycardia) (HCC); Former smoker (30 pack year history, quit 03/2018); Non-ischemic cardiomyopathy (HCC); Chronic combined systolic and diastolic congestive heart failure (HCC); S/P aortic valve replacement with bioprosthetic valve + resection ascending thoracic aortic  aneurysm; S/P ascending aortic aneurysm repair;  PVC (premature ventricular contraction); Coronary artery disease involving native coronary artery of native heart without angina pectoris; Obesity (BMI 30.0-34.9); and Other abnormal glucose (prediabetes) on their problem list.  He is divorced from wife, has male partner, not frequently active, declines STD testing.  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    Patient is s/p aortic valve replacement with biosynthetic valve and thoracic aortic aneurysm repair on 04/10/2018 by Dr. Cornelius Moras, has systolic heart failure with post op TEE showing EF 35-40%.  He was placed on amiodarone due to PVCs with improvement, then transitioned to mexiletine, toprol, entresto. Following with Dr. Shirlee Latch at the heart failure clinic. Echo 10/2018 showed EF up to 40-45%, normally functioning valve, NYHA class II. Mod nonobstructive CAD per 03/2018 cath.  Zio patch in 4/21 showed PVC burden down to 3.4%.  He has been stable generally, though with ongoing limitations with activity and fatigue.  He recently acquired health insurance.  Has not been seen by Heart failure clinic in over a year. Continues on medications  Colonoscopy was normal.   He has depression/anxiety, feels like he is doing fairly well He is on zoloft 100 mg, buspar 10 mg TID which has helped.  Formerly on wellbutrin, was stopped due to ? Seizure at [employer]. Feels fairly controlled, just ready for financial stress to be resolved, was living in car for a while.   BMI is There is no height or weight on file to calculate BMI., he is working on diet and exercise., trying to eat healthy, oatmeal, lean proteins, fruits/veggies, watches sodium closely  Was formerly very active, went to GYM daily, slowly trying to increase now walking around the block in his neighborhood 2-3 days a week 4-5 bottles a day of water   1 glass wine/week  Wt Readings from Last 3 Encounters:  12/26/22 252 lb 3.2 oz (114.4 kg)  07/25/22 249 lb (112.9 kg)  04/18/22 252 lb 6.4 oz (114.5 kg)   His blood pressure has been controlled at home, today their BP is    BP Readings from Last 3 Encounters:  12/26/22 120/80  07/25/22 134/76  04/18/22 104/68  He does workout, walking 3 days a week. He denies chest pain, shortness of breath, dizziness, edema, PND. Has intermittent fatigue, "just don't feel good."    He is on cholesterol medication, crestor 40 mg daily and denies myalgias. His cholesterol is at goal less than 70. The cholesterol last visit was:   Lab Results  Component Value Date   CHOL 130 12/26/2022   HDL 54 12/26/2022   LDLCALC 52 12/26/2022   TRIG 121 12/26/2022   CHOLHDL 2.4 12/26/2022    He has been working on diet and exercise for prediabetes, and denies increased appetite, nausea, paresthesia of the feet, polydipsia, polyuria, visual disturbances, vomiting and weight loss.  On Jardiance via cardiology for heart -  Last A1C in the office was:  Lab Results  Component Value Date   HGBA1C 6.1 (H) 12/28/2021    Patient is on Vitamin D supplement.   Lab Results  Component Value Date   VD25OH 81 12/28/2021        Lab Results  Component Value Date   PSA 0.54 12/28/2021   PSA 0.53 12/28/2020    Denies obstructive urinary sx; straining, nocturia, dribbing, frequency   Current Medications:   Current Outpatient Medications (Endocrine & Metabolic):  empagliflozin (JARDIANCE) 10 MG TABS tablet, Take 1 tablet (10 mg total) by mouth daily. NEEDS FOLLOW UP APPOINTMENT FOR ANYMORE REFILLS  Current Outpatient Medications (Cardiovascular):    eplerenone (INSPRA) 50 MG tablet, Take 1 tablet by mouth once daily   ezetimibe (ZETIA) 10 MG tablet, Take 1 tablet (10 mg total) by mouth daily.   furosemide (LASIX) 40 MG tablet, Take 1 tablet by mouth once daily   metoprolol succinate (TOPROL-XL) 100 MG 24 hr tablet,  Take 1 tablet (100 mg total) by mouth 2 (two) times daily in the morning and at bedtime. Take with or immediately following a meal.   mexiletine (MEXITIL) 150 MG capsule, Take 2 capsules by mouth twice daily   rosuvastatin (CRESTOR) 40 MG tablet, Take 1 tablet (40 mg total) by mouth daily.   sacubitril-valsartan (ENTRESTO) 97-103 MG, Take 1 tablet by mouth 2 (two) times daily.  Current Outpatient Medications (Respiratory):    cetirizine (ZYRTEC) 10 MG tablet, Take 10 mg by mouth daily.  Current Outpatient Medications (Analgesics):    acetaminophen (TYLENOL) 500 MG tablet, Take 1,000 mg by mouth every 6 (six) hours as needed for mild pain or headache.   aspirin EC 81 MG tablet, Take 81 mg by mouth daily. Swallow whole.   Current Outpatient Medications (Other):    busPIRone (BUSPAR) 10 MG tablet, TAKE 1 TABLET BY MOUTH THREE TIMES DAILY FOR CHRONIC ANXIETY AND MOOD   Cholecalciferol (VITAMIN D3) 125 MCG (5000 UT) TABS, Take 5,000 Units by mouth daily.    pantoprazole (PROTONIX) 40 MG tablet, Take 1 tablet by mouth once daily   potassium chloride (KLOR-CON) 10 MEQ tablet, Take 1 tablet by mouth once daily   sertraline (ZOLOFT) 100 MG tablet, Take 1 tablet by mouth once daily  Medical History:  Past Medical History:  Diagnosis Date   Anxiety    on meds   Aortic root aneurysm    Aortic stenosis    Ascending aortic aneurysm (HCC)    CAD (coronary artery disease)    Chronic combined systolic and diastolic congestive heart failure (HCC)    on meds   Depression    on meds   Diabetes mellitus without complication (HCC)    on meds   Dilated aortic root (HCC)    Hyperlipidemia    on meds   Hypertension    on meds   Non-ischemic cardiomyopathy (HCC)    Nonischemic cardiomyopathy (HCC)    S/P aortic valve replacement with bioprosthetic valve 04/10/2018   29 mm Edwards Inspiris Resilia stented bovine pericardial tissue valve   S/P ascending aortic aneurysm repair 04/10/2018   26 mm  Hemashield Platinum supracoronary straight graft   Seasonal allergies    SVT (supraventricular tachycardia) (HCC)    Viral myocarditis 2004   Vitamin D deficiency    Allergies No Known Allergies  Immunization History  Administered Date(s) Administered   Influenza Inj Mdck Quad Pf 12/05/2016   Influenza Inj Mdck Quad With Preservative 11/11/2019   Influenza,inj,Quad PF,6+ Mos 11/10/2017, 12/28/2020, 12/28/2021   Influenza-Unspecified 12/05/2016, 11/28/2018   Pneumococcal-Unspecified 02/08/1992   Td 02/07/1997   Tdap 06/01/2017   Colonoscopy: 05/12/21, due 2030   Last eye: has readers, no other concerns Last dental: no concerns, last 2020  SURGICAL HISTORY He  has a past surgical history that includes Orchiectomy (Left, 02/08/1980); Varicose vein surgery (Left, 05/09/1998); RIGHT HEART CATH AND CORONARY ANGIOGRAPHY (N/A, 03/28/2018); Multiple extractions with alveoloplasty (N/A, 04/05/2018); Ascending aortic root replacement (N/A, 04/10/2018); TEE without cardioversion (N/A,  04/10/2018); Aortic valve replacement (N/A, 04/10/2018); and Inguinal hernia repair (Left). FAMILY HISTORY His family history includes Alzheimer's disease in his mother; Breast cancer (age of onset: 39) in his mother; Cancer in his father; Colon cancer (age of onset: 109) in his maternal grandmother; Colon polyps in his father; Colon polyps (age of onset: 51) in his maternal grandmother; Diabetes in his daughter, mother, and son; Esophageal cancer (age of onset: 35) in his father; Hypertension in his father, mother, and sister. SOCIAL HISTORY He  reports that he quit smoking about 4 years ago. His smoking use included cigarettes. He started smoking about 44 years ago. He has a 30.1 pack-year smoking history. He has never used smokeless tobacco. He reports that he does not currently use alcohol after a past usage of about 8.0 standard drinks of alcohol per week. He reports that he does not use drugs.   Review of  Systems  Constitutional:  Negative for chills, fever, malaise/fatigue and weight loss.  HENT:  Negative for congestion, hearing loss, sinus pain, sore throat and tinnitus.   Eyes:  Negative for blurred vision and double vision.  Respiratory:  Negative for cough, hemoptysis, sputum production, shortness of breath and wheezing.   Cardiovascular:  Positive for leg swelling. Negative for chest pain, palpitations, orthopnea and claudication.  Gastrointestinal:  Negative for abdominal pain, blood in stool, constipation, diarrhea, heartburn, melena, nausea and vomiting.  Genitourinary: Negative.  Negative for dysuria and urgency.  Musculoskeletal:  Negative for back pain, falls, joint pain, myalgias and neck pain.  Skin:  Negative for rash.  Neurological:  Negative for dizziness, tingling, tremors, sensory change, weakness and headaches.  Endo/Heme/Allergies:  Negative for polydipsia. Does not bruise/bleed easily.  Psychiatric/Behavioral:  Negative for depression, substance abuse and suicidal ideas. The patient is not nervous/anxious and does not have insomnia.   All other systems reviewed and are negative.   Physical Exam: There were no vitals taken for this visit. Wt Readings from Last 3 Encounters:  12/26/22 252 lb 3.2 oz (114.4 kg)  07/25/22 249 lb (112.9 kg)  04/18/22 252 lb 6.4 oz (114.5 kg)  There is no height or weight on file to calculate BMI.  General Appearance: Well nourished, well dressed male in no acute distress. Eyes: PERRLA, EOMs, conjunctiva no swelling or erythema Sinuses: No Frontal/maxillary tenderness ENT/Mouth: Ext aud canals clear, TMs without erythema, bulging. No erythema, swelling, or exudate on post pharynx.  Tonsils not swollen or erythematous. Hearing normal.  Neck: Supple, thyroid normal.  Respiratory: Respiratory effort normal, BS equal bilaterally without rales, rhonchi, wheezing or stridor.  Cardio: Regular rate and rhythm, no murmurs, rubs or gallops.  Brisk  peripheral pulses with 1+ pitting edema of ankles and feet bilaterally;  large torturous varicose veins bilateral legs, non-tender.  Abdomen: Soft, + BS.  Non tender, no guarding, rebound, hernias, masses. Lymphatics: Non tender without lymphadenopathy.  Musculoskeletal: Full ROM, 5/5 strength, normal gait.  Skin: Warm, dry without rashes, lesions, ecchymosis.  Neuro: Cranial nerves intact. Normal muscle tone, no cerebellar symptoms.  Psych: Awake and oriented X 3, anxious affect, Insight and Judgment appropriate.  GU: reports regular self exams, DECLINES today   EKG: defer to cardiology  Iliana Hutt E  11:06 AM

## 2023-01-02 ENCOUNTER — Encounter: Payer: Medicare Other | Admitting: Nurse Practitioner

## 2023-01-02 DIAGNOSIS — Z87891 Personal history of nicotine dependence: Secondary | ICD-10-CM

## 2023-01-02 DIAGNOSIS — F419 Anxiety disorder, unspecified: Secondary | ICD-10-CM

## 2023-01-02 DIAGNOSIS — Z953 Presence of xenogenic heart valve: Secondary | ICD-10-CM

## 2023-01-02 DIAGNOSIS — Z79899 Other long term (current) drug therapy: Secondary | ICD-10-CM

## 2023-01-02 DIAGNOSIS — I428 Other cardiomyopathies: Secondary | ICD-10-CM

## 2023-01-02 DIAGNOSIS — I471 Supraventricular tachycardia, unspecified: Secondary | ICD-10-CM

## 2023-01-02 DIAGNOSIS — I700 Atherosclerosis of aorta: Secondary | ICD-10-CM

## 2023-01-02 DIAGNOSIS — Z0001 Encounter for general adult medical examination with abnormal findings: Secondary | ICD-10-CM

## 2023-01-02 DIAGNOSIS — R7309 Other abnormal glucose: Secondary | ICD-10-CM

## 2023-01-02 DIAGNOSIS — Z125 Encounter for screening for malignant neoplasm of prostate: Secondary | ICD-10-CM

## 2023-01-02 DIAGNOSIS — I77810 Thoracic aortic ectasia: Secondary | ICD-10-CM

## 2023-01-02 DIAGNOSIS — I5042 Chronic combined systolic (congestive) and diastolic (congestive) heart failure: Secondary | ICD-10-CM

## 2023-01-02 DIAGNOSIS — E785 Hyperlipidemia, unspecified: Secondary | ICD-10-CM

## 2023-01-02 DIAGNOSIS — I10 Essential (primary) hypertension: Secondary | ICD-10-CM

## 2023-01-02 DIAGNOSIS — Z1389 Encounter for screening for other disorder: Secondary | ICD-10-CM

## 2023-01-02 DIAGNOSIS — E559 Vitamin D deficiency, unspecified: Secondary | ICD-10-CM

## 2023-01-02 DIAGNOSIS — Z1329 Encounter for screening for other suspected endocrine disorder: Secondary | ICD-10-CM

## 2023-01-09 ENCOUNTER — Other Ambulatory Visit (HOSPITAL_COMMUNITY): Payer: Self-pay | Admitting: Cardiology

## 2023-01-09 ENCOUNTER — Other Ambulatory Visit (HOSPITAL_COMMUNITY): Payer: Self-pay

## 2023-01-09 ENCOUNTER — Other Ambulatory Visit: Payer: Self-pay

## 2023-01-09 MED ORDER — EMPAGLIFLOZIN 10 MG PO TABS
10.0000 mg | ORAL_TABLET | Freq: Every day | ORAL | 0 refills | Status: DC
  Filled 2023-01-09 – 2023-01-27 (×3): qty 60, 60d supply, fill #0

## 2023-01-10 ENCOUNTER — Other Ambulatory Visit (HOSPITAL_COMMUNITY): Payer: Self-pay

## 2023-01-10 MED ORDER — INFLUENZA VIRUS VACC SPLIT PF (FLUZONE) 0.5 ML IM SUSY
0.5000 mL | PREFILLED_SYRINGE | Freq: Once | INTRAMUSCULAR | 0 refills | Status: AC
  Filled 2023-01-10: qty 0.5, 1d supply, fill #0

## 2023-01-13 ENCOUNTER — Other Ambulatory Visit: Payer: Self-pay | Admitting: Nurse Practitioner

## 2023-01-13 DIAGNOSIS — I428 Other cardiomyopathies: Secondary | ICD-10-CM

## 2023-01-16 ENCOUNTER — Other Ambulatory Visit: Payer: Self-pay

## 2023-01-17 ENCOUNTER — Other Ambulatory Visit (HOSPITAL_COMMUNITY): Payer: Self-pay | Admitting: Cardiology

## 2023-01-18 ENCOUNTER — Encounter: Payer: Self-pay | Admitting: Cardiology

## 2023-01-18 ENCOUNTER — Ambulatory Visit: Payer: Medicare Other | Attending: Cardiology | Admitting: Cardiology

## 2023-01-18 ENCOUNTER — Other Ambulatory Visit (HOSPITAL_COMMUNITY): Payer: Self-pay | Admitting: Cardiology

## 2023-01-18 VITALS — BP 114/66 | HR 64 | Ht 75.0 in | Wt 250.8 lb

## 2023-01-18 DIAGNOSIS — I493 Ventricular premature depolarization: Secondary | ICD-10-CM

## 2023-01-18 DIAGNOSIS — I5022 Chronic systolic (congestive) heart failure: Secondary | ICD-10-CM

## 2023-01-18 DIAGNOSIS — Z952 Presence of prosthetic heart valve: Secondary | ICD-10-CM | POA: Diagnosis not present

## 2023-01-18 NOTE — Progress Notes (Signed)
  Electrophysiology Office Note:   Date:  01/18/2023  ID:  ODELL CARRERO, DOB [birth year], MRN [id number]  Primary Cardiologist: Rollene Rotunda, MD Primary Heart Failure: None Electrophysiologist: Nida Manfredi Jorja Loa, MD      History of Present Illness:   Tyler Berger is a 62 y.o. male with h/o chronic systolic heart failure, SVT, hypertension, prior alcohol and tobacco use, mild coronary artery disease, PVCs, aortic stenosis post AVR seen today for routine electrophysiology followup.   Since last being seen in our clinic the patient reports doing well.  He is unaware of PVCs.  He feels that he is doing well on mexiletine.  He was having trouble affording the 90-day prescription, but at 30 days, he was able to afford the medication.  He continues to go to the gym and exercise without issue.  he denies chest pain, palpitations, dyspnea, PND, orthopnea, nausea, vomiting, dizziness, syncope, edema, weight gain, or early satiety.   Review of systems complete and found to be negative unless listed in HPI.   EP Information / Studies Reviewed:    EKG is not ordered today. EKG from 12/26/22 reviewed which showed sinus rhythm, PVC        Risk Assessment/Calculations:              Physical Exam:   VS:  BP 114/66 (BP Location: Left Arm, Patient Position: Sitting, Cuff Size: Large)   Pulse 64   Ht 6\' 3"  (1.905 m)   Wt 250 lb 12.8 oz (113.8 kg)   SpO2 97%   BMI 31.35 kg/m    Wt Readings from Last 3 Encounters:  01/18/23 250 lb 12.8 oz (113.8 kg)  12/26/22 252 lb 3.2 oz (114.4 kg)  07/25/22 249 lb (112.9 kg)     GEN: Well nourished, well developed in no acute distress NECK: No JVD; No carotid bruits CARDIAC: Regular rate and rhythm, no murmurs, rubs, gallops RESPIRATORY:  Clear to auscultation without rales, wheezing or rhonchi  ABDOMEN: Soft, non-tender, non-distended EXTREMITIES:  No edema; No deformity   ASSESSMENT AND PLAN:    1.  PVCs: Elevated burden on cardiac monitor.  Now  on mexiletine with reduced burden.  He was initially having issues affording mexiletine, but cannot afford it with 30-day prescriptions.  He is feeling well and wishes no changes.  Alfretta Pinch continue with current management.  2.  Chronic systolic heart failure: Followed by heart failure clinic.  Ejection fraction is normalized.  Continue with current management.  3.  Severe aortic stenosis with bicuspid aortic valve: Post AVR.  Plan per primary cardiology.  4.  Coronary artery disease: Moderate nonobstructive disease by catheterization in 2020.  No current chest pain  Follow up with EP APP in 12 months  Signed, Najai Waszak Jorja Loa, MD

## 2023-01-27 ENCOUNTER — Other Ambulatory Visit (HOSPITAL_COMMUNITY): Payer: Self-pay

## 2023-02-13 ENCOUNTER — Other Ambulatory Visit: Payer: Self-pay | Admitting: Nurse Practitioner

## 2023-02-13 DIAGNOSIS — I428 Other cardiomyopathies: Secondary | ICD-10-CM

## 2023-02-14 NOTE — Progress Notes (Signed)
 Patient ID: Tyler Berger, male   DOB: 1960/05/10, 63 y.o.   MRN: [id number]  COMPLETE PHYSICAL  Assessment and Plan:   Encounter for general adult medical examination with abnormal findings Due Yearly   Essential hypertension Continue medications- Metoprolol  100 mg BID, Entresto  97/103 mg BID, Furosemide  40 mg QD Monitor blood pressure at home; call if consistently over 130/80 Continue DASH diet.   Reminder to go to the ER if any CP, SOB, nausea, dizziness, severe HA, changes vision/speech, left arm numbness and tingling and jaw pain.   Chronic combined systolic and diastolic congestive heart failure [facility]) May try to maximize medications/blood pressure LONG discussion about sodium/sugar and maximizing medications to help his heart not work as hard.  Weight daily and bring log of weight and BP to heart failure clinic Followed by cardiology  Non-ischemic cardiomyopathy (HCC) Try to maximize medications/blood pressure LONG discussion about sodium/sugar and maximizing medications to help his heart not work as hard.   Goal weight is likely closer to 235-240 WEIGH DAILY and bring log of weight and BP to heart failure clinic Strongly encouraged use of compression socks to help with edema  Aortic Atherosclerosis per CT 2019(HCC) Control BP, weight , blood sugar and cholesterol Continue to follow with cardiology  SVT (supraventricular tachycardia) (HCC) Remains controlled with meds; cardiology following   PVC (premature ventricular contraction) NSR at this time, recent Zio with burden down to 3.4% Off of amiodarone ; cardiology following  S/P aortic valve replacement with bioprosthetic valve + resection ascending thoracic aortic aneurysm Continue cardiology follow up  Hyperlipidemia, unspecified hyperlipidemia type Continue rosuvastatin ; Continue to follow with cardiology decrease fatty foods increase activity.  Lipid panel  Abnormal glucose (prediabetes) Discussed disease and  risks; on jardiance  for heart/sugars  Discussed diet/exercise, weight management  Given glucometer sample and strips, can check PRN fasting  If A1C 7+ plan to start metformin -     Hemoglobin A1c  Anxiety Continue zoloft , buspar  and xanax  AS needed Declines dose change today;  Financial stress significant - pending disability  Stress management techniques discussed, increase water, good sleep hygiene discussed, increase exercise, and increase veggies.   Vitamin D  deficiency Continue supplement  - Vit D  Former smoker Quit smoking, congratulated the patient and encouraged him to continue.  30 pack year will consider chest CT does not wish to schedule today  Prostate cancer screening PSA  Screening for hematuria/proteinuria - Routine UA with reflex microscopic - Microalbumin/creatinine urine ratio  Medication management -     CBC with Differential/Platelet -     COMPLETE METABOLIC PANEL WITH GFR -     Magnesium  -     TSH -     Hemoglobin A1C w/out eAG -     VITAMIN D  25 Hydroxy (Vit-D Deficiency, Fractures) -     Urinalysis, Routine w reflex microscopic -     Microalbumin / creatinine urine ratio     Continue diet and meds as discussed. Further disposition pending results of labs. Future Appointments  Date Time Provider Department Center  02/20/2024  3:00 PM Blondina Coderre E, NP [facility] None    HPI 63 y.o. male  presents for CPE. He has Hyperlipidemia; Hypertension; Anxiety; Vitamin D  deficiency; Medication management; Varicose veins of both lower extremities; LBBB (left bundle branch block); SVT (supraventricular tachycardia) (HCC); Former smoker (30 pack year history, quit 03/2018); Non-ischemic cardiomyopathy (HCC); Chronic combined systolic and diastolic congestive heart failure (HCC); S/P aortic valve replacement with bioprosthetic valve + resection ascending thoracic  aortic aneurysm; S/P ascending aortic aneurysm repair; PVC (premature ventricular contraction);  Coronary artery disease involving native coronary artery of native heart without angina pectoris; Obesity (BMI 30.0-34.9); and Other abnormal glucose (prediabetes) on their problem list.  He is divorced from wife, has male partner, not frequently active, declines STD testing.  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    Patient is s/p aortic valve replacement with biosynthetic valve and thoracic aortic aneurysm repair on 04/10/2018 by Dr. Dusty, has systolic heart failure with post op TEE showing EF 35-40%.  He was placed on amiodarone  due to PVCs with improvement, then transitioned to mexiletine, toprol , entresto . Following with Dr. Rolan at the heart failure clinic. Echo 10/2018 showed EF up to 40-45%, normally functioning valve, NYHA class II. Mod nonobstructive CAD per 03/2018 cath.  Zio patch in 4/21 showed PVC burden down to 3.4%.  Follows with Dr. Inocencio, last visit 01/18/23 also is followed by heart failure clinic  Colonoscopy 05/12/21- 1 precancerous polyp next colonoscopy due 2030  He has depression/anxiety, feels like he is doing fairly well He is on zoloft  100 mg, buspar  10 mg TID which has helped.  Formerly on wellbutrin , was stopped due to ? Seizure at [employer].   BMI is Body mass index is 33.96 kg/m., he is working on diet and exercise., trying to eat healthy, oatmeal, lean proteins, fruits/veggies, watches sodium closely  Was formerly very active, went to [employer] daily, slowly trying to increase now walking around the block in his neighborhood 2-3 days a week 4-5 bottles a day of water  1 glass wine/week  Wt Readings from Last 3 Encounters:  02/15/23 250 lb 6.4 oz (113.6 kg)  01/18/23 250 lb 12.8 oz (113.8 kg)  12/26/22 252 lb 3.2 oz (114.4 kg)   His blood pressure has been controlled at home on Furosemide  40 mg every day, Metoprolol  100 mg BID, Entresto  97/103 mg BID and  Mexitil  150 mg BID, today their BP is BP: 106/66  BP Readings from Last 3 Encounters:  02/15/23 106/66  01/18/23 114/66  12/26/22 120/80  He does workout, walking 3 days a week. He denies chest pain, shortness of breath, dizziness, edema, PND. Has intermittent fatigue, just don't feel good.    He is on cholesterol medication, crestor  40 mg daily and denies myalgias. His cholesterol is at goal less than 70. The cholesterol last visit was:   Lab Results  Component Value Date   CHOL 130 12/26/2022   HDL 54 12/26/2022   LDLCALC 52 12/26/2022   TRIG 121 12/26/2022   CHOLHDL 2.4 12/26/2022    He has been working on diet and exercise for prediabetes, and denies increased appetite, nausea, paresthesia of the feet, polydipsia, polyuria, visual disturbances, vomiting and weight loss.  On Jardiance  via cardiology for heart -  Last A1C in the office was:  Lab Results  Component Value Date   HGBA1C 6.1 (H) 12/28/2021    Patient is on Vitamin D  supplement.   Lab Results  Component Value Date   VD25OH 81 12/28/2021        Lab Results  Component Value Date   PSA 0.54 12/28/2021   PSA 0.53 12/28/2020    Denies obstructive urinary sx; straining, nocturia, dribbing, frequency- will get up 1 time a night to urinate.    Current Medications:   Current Outpatient Medications (Endocrine & Metabolic):  empagliflozin  (JARDIANCE ) 10 MG TABS tablet, Take 1 tablet (10 mg total) by mouth daily. NEEDS FOLLOW UP APPOINTMENT FOR ANYMORE REFILLS  Current Outpatient Medications (Cardiovascular):    eplerenone  (INSPRA ) 50 MG tablet, Take 1 tablet by mouth once daily   ezetimibe  (ZETIA ) 10 MG tablet, Take 1 tablet (10 mg total) by mouth daily.   furosemide  (LASIX ) 40 MG tablet, Take 1 tablet by mouth once daily   metoprolol  succinate (TOPROL -XL) 100 MG 24 hr tablet, Take 1 tablet (100 mg total) by mouth 2 (two) times daily in the morning and at bedtime. Take with or immediately following a meal.    mexiletine (MEXITIL ) 150 MG capsule, Take 2 capsules by mouth twice daily   rosuvastatin  (CRESTOR ) 40 MG tablet, Take 1 tablet (40 mg total) by mouth daily.   sacubitril -valsartan  (ENTRESTO ) 97-103 MG, Take 1 tablet by mouth 2 (two) times daily.  Current Outpatient Medications (Respiratory):    cetirizine (ZYRTEC) 10 MG tablet, Take 10 mg by mouth daily.  Current Outpatient Medications (Analgesics):    acetaminophen  (TYLENOL ) 500 MG tablet, Take 1,000 mg by mouth every 6 (six) hours as needed for mild pain or headache.   aspirin  EC 81 MG tablet, Take 81 mg by mouth daily. Swallow whole.   Current Outpatient Medications (Other):    busPIRone  (BUSPAR ) 10 MG tablet, TAKE 1 TABLET BY MOUTH THREE TIMES DAILY FOR CHRONIC ANXIETY AND MOOD   Cholecalciferol (VITAMIN D3) 125 MCG (5000 UT) TABS, Take 5,000 Units by mouth daily.    pantoprazole  (PROTONIX ) 40 MG tablet, Take 1 tablet by mouth once daily   potassium chloride  (KLOR-CON ) 10 MEQ tablet, Take 1 tablet by mouth once daily   sertraline  (ZOLOFT ) 100 MG tablet, Take 1 tablet by mouth once daily  Medical History:  Past Medical History:  Diagnosis Date   Anxiety    on meds   Aortic root aneurysm    Aortic stenosis    Ascending aortic aneurysm (HCC)    CAD (coronary artery disease)    Chronic combined systolic and diastolic congestive heart failure (HCC)    on meds   Depression    on meds   Diabetes mellitus without complication (HCC)    on meds   Dilated aortic root (HCC)    Hyperlipidemia    on meds   Hypertension    on meds   Non-ischemic cardiomyopathy (HCC)    Nonischemic cardiomyopathy (HCC)    S/P aortic valve replacement with bioprosthetic valve 04/10/2018   29 mm Edwards Inspiris Resilia stented bovine pericardial tissue valve   S/P ascending aortic aneurysm repair 04/10/2018   26 mm Hemashield Platinum supracoronary straight graft   Seasonal allergies    SVT (supraventricular tachycardia) (HCC)    Viral myocarditis  2004   Vitamin D  deficiency    Allergies No Known Allergies  Immunization History  Administered Date(s) Administered   Influenza Inj Mdck Quad Pf 12/05/2016   Influenza Inj Mdck Quad With Preservative 11/11/2019   Influenza, Seasonal, Injecte, Preservative Fre 01/10/2023   Influenza,inj,Quad PF,6+ Mos 11/10/2017, 12/28/2020, 12/28/2021   Influenza-Unspecified 12/05/2016, 11/28/2018   Pneumococcal-Unspecified 02/08/1992   Td 02/07/1997   Tdap 06/01/2017      Last eye: needs appt Last dental: 2020  SURGICAL HISTORY He  has a past surgical history that includes Orchiectomy (Left, 02/08/1980); Varicose vein surgery (Left, 05/09/1998); RIGHT HEART CATH AND CORONARY ANGIOGRAPHY (N/A, 03/28/2018); Multiple extractions with alveoloplasty (N/A, 04/05/2018); Ascending aortic root replacement (N/A, 04/10/2018); TEE without cardioversion (N/A, 04/10/2018);  Aortic valve replacement (N/A, 04/10/2018); and Inguinal hernia repair (Left). FAMILY HISTORY His family history includes Alzheimer's disease in his mother; Breast cancer (age of onset: 76) in his mother; Cancer in his father; Colon cancer (age of onset: 42) in his maternal grandmother; Colon polyps in his father; Colon polyps (age of onset: 35) in his maternal grandmother; Diabetes in his daughter, mother, and son; Esophageal cancer (age of onset: 42) in his father; Hypertension in his father, mother, and sister. SOCIAL HISTORY He  reports that he quit smoking about 4 years ago. His smoking use included cigarettes. He started smoking about 45 years ago. He has a 30.1 pack-year smoking history. He has never used smokeless tobacco. He reports that he does not currently use alcohol after a past usage of about 8.0 standard drinks of alcohol per week. He reports that he does not use drugs.   Review of Systems  Constitutional:  Negative for chills, fever, malaise/fatigue and weight loss.  HENT:  Negative for congestion, hearing loss, sinus pain,  sore throat and tinnitus.   Eyes:  Negative for blurred vision and double vision.  Respiratory:  Negative for cough, hemoptysis, sputum production, shortness of breath and wheezing.   Cardiovascular:  Negative for chest pain, palpitations, orthopnea, claudication and leg swelling.  Gastrointestinal:  Negative for abdominal pain, blood in stool, constipation, diarrhea, heartburn, melena, nausea and vomiting.  Genitourinary: Negative.  Negative for dysuria and urgency.  Musculoskeletal:  Negative for back pain, falls, joint pain, myalgias and neck pain.  Skin:  Negative for rash.  Neurological:  Negative for dizziness, tingling, tremors, sensory change, weakness and headaches.  Endo/Heme/Allergies:  Negative for polydipsia. Does not bruise/bleed easily.  Psychiatric/Behavioral:  Negative for depression, substance abuse and suicidal ideas. The patient is not nervous/anxious and does not have insomnia.   All other systems reviewed and are negative.   Physical Exam: BP 106/66   Pulse 71   Temp (!) 97.3 F (36.3 C)   Ht 6' (1.829 m)   Wt 250 lb 6.4 oz (113.6 kg)   SpO2 97%   BMI 33.96 kg/m  Wt Readings from Last 3 Encounters:  02/15/23 250 lb 6.4 oz (113.6 kg)  01/18/23 250 lb 12.8 oz (113.8 kg)  12/26/22 252 lb 3.2 oz (114.4 kg)  Body mass index is 33.96 kg/m.  General Appearance: Well nourished, well dressed male in no acute distress. Eyes: PERRLA, EOMs, conjunctiva no swelling or erythema Sinuses: No Frontal/maxillary tenderness ENT/Mouth: Ext aud canals clear, TMs without erythema, bulging. No erythema, swelling, or exudate on post pharynx.  Tonsils not swollen or erythematous. Hearing normal.  Neck: Supple, thyroid  normal.  Respiratory: Respiratory effort normal, BS equal bilaterally without rales, rhonchi, wheezing or stridor.  Cardio: Regular rate and rhythm, no murmurs, rubs or gallops.  Brisk peripheral pulses with 1+ nonpitting edema of ankles and feet bilaterally;  large  torturous varicose veins bilateral legs, non-tender.  Abdomen: Soft, + BS.  Non tender, no guarding, rebound, hernias, masses. Lymphatics: Non tender without lymphadenopathy.  Musculoskeletal: Full ROM, 5/5 strength, normal gait.  Skin: Warm, dry . Red/purple discoloration of lower legs bilaterally Neuro: Cranial nerves intact. Normal muscle tone, no cerebellar symptoms.  Psych: Awake and oriented X 3, anxious affect, Insight and Judgment appropriate.  GU: reports regular self exams, DECLINES today   EKG: defer to cardiology  Essa Wenk E Holt Woolbright 2:58 PM

## 2023-02-15 ENCOUNTER — Encounter: Payer: Self-pay | Admitting: Nurse Practitioner

## 2023-02-15 ENCOUNTER — Ambulatory Visit (INDEPENDENT_AMBULATORY_CARE_PROVIDER_SITE_OTHER): Payer: Medicare Other | Admitting: Nurse Practitioner

## 2023-02-15 VITALS — BP 106/66 | HR 71 | Temp 97.3°F | Ht 72.0 in | Wt 250.4 lb

## 2023-02-15 DIAGNOSIS — R7309 Other abnormal glucose: Secondary | ICD-10-CM

## 2023-02-15 DIAGNOSIS — Z79899 Other long term (current) drug therapy: Secondary | ICD-10-CM | POA: Diagnosis not present

## 2023-02-15 DIAGNOSIS — Z1329 Encounter for screening for other suspected endocrine disorder: Secondary | ICD-10-CM

## 2023-02-15 DIAGNOSIS — I5042 Chronic combined systolic (congestive) and diastolic (congestive) heart failure: Secondary | ICD-10-CM

## 2023-02-15 DIAGNOSIS — E559 Vitamin D deficiency, unspecified: Secondary | ICD-10-CM

## 2023-02-15 DIAGNOSIS — Z953 Presence of xenogenic heart valve: Secondary | ICD-10-CM

## 2023-02-15 DIAGNOSIS — I428 Other cardiomyopathies: Secondary | ICD-10-CM

## 2023-02-15 DIAGNOSIS — Z0000 Encounter for general adult medical examination without abnormal findings: Secondary | ICD-10-CM | POA: Diagnosis not present

## 2023-02-15 DIAGNOSIS — I10 Essential (primary) hypertension: Secondary | ICD-10-CM

## 2023-02-15 DIAGNOSIS — F419 Anxiety disorder, unspecified: Secondary | ICD-10-CM

## 2023-02-15 DIAGNOSIS — I77810 Thoracic aortic ectasia: Secondary | ICD-10-CM

## 2023-02-15 DIAGNOSIS — I471 Supraventricular tachycardia, unspecified: Secondary | ICD-10-CM

## 2023-02-15 DIAGNOSIS — I700 Atherosclerosis of aorta: Secondary | ICD-10-CM

## 2023-02-15 DIAGNOSIS — Z0001 Encounter for general adult medical examination with abnormal findings: Secondary | ICD-10-CM

## 2023-02-15 DIAGNOSIS — E785 Hyperlipidemia, unspecified: Secondary | ICD-10-CM

## 2023-02-15 DIAGNOSIS — Z87891 Personal history of nicotine dependence: Secondary | ICD-10-CM

## 2023-02-15 DIAGNOSIS — Z125 Encounter for screening for malignant neoplasm of prostate: Secondary | ICD-10-CM

## 2023-02-15 DIAGNOSIS — I493 Ventricular premature depolarization: Secondary | ICD-10-CM

## 2023-02-15 DIAGNOSIS — Z1389 Encounter for screening for other disorder: Secondary | ICD-10-CM

## 2023-02-15 NOTE — Patient Instructions (Signed)

## 2023-02-16 LAB — COMPLETE METABOLIC PANEL WITH GFR
AG Ratio: 1.6 (calc) (ref 1.0–2.5)
ALT: 34 U/L (ref 9–46)
AST: 30 U/L (ref 10–35)
Albumin: 4.6 g/dL (ref 3.6–5.1)
Alkaline phosphatase (APISO): 58 U/L (ref 35–144)
BUN: 18 mg/dL (ref 7–25)
CO2: 22 mmol/L (ref 20–32)
Calcium: 9.8 mg/dL (ref 8.6–10.3)
Chloride: 103 mmol/L (ref 98–110)
Creat: 0.88 mg/dL (ref 0.70–1.35)
Globulin: 2.8 g/dL (ref 1.9–3.7)
Glucose, Bld: 102 mg/dL — ABNORMAL HIGH (ref 65–99)
Potassium: 4 mmol/L (ref 3.5–5.3)
Sodium: 138 mmol/L (ref 135–146)
Total Bilirubin: 0.4 mg/dL (ref 0.2–1.2)
Total Protein: 7.4 g/dL (ref 6.1–8.1)
eGFR: 97 mL/min/{1.73_m2} (ref >=60)

## 2023-02-16 LAB — CBC WITH DIFFERENTIAL/PLATELET
Absolute Lymphocytes: 2061 {cells}/uL (ref 850–3900)
Absolute Monocytes: 874 {cells}/uL (ref 200–950)
Basophils Absolute: 64 {cells}/uL (ref 0–200)
Basophils Relative: 0.7 %
Eosinophils Absolute: 184 {cells}/uL (ref 15–500)
Eosinophils Relative: 2 %
HCT: 51.5 % — ABNORMAL HIGH (ref 38.5–50.0)
Hemoglobin: 17.6 g/dL — ABNORMAL HIGH (ref 13.2–17.1)
MCH: 31.4 pg (ref 27.0–33.0)
MCHC: 34.2 g/dL (ref 32.0–36.0)
MCV: 92 fL (ref 80.0–100.0)
MPV: 10.3 fL (ref 7.5–12.5)
Monocytes Relative: 9.5 %
Neutro Abs: 6017 {cells}/uL (ref 1500–7800)
Neutrophils Relative %: 65.4 %
Platelets: 134 10*3/uL — ABNORMAL LOW (ref 140–400)
RBC: 5.6 10*6/uL (ref 4.20–5.80)
RDW: 12.9 % (ref 11.0–15.0)
Total Lymphocyte: 22.4 %
WBC: 9.2 10*3/uL (ref 3.8–10.8)

## 2023-02-16 LAB — URINALYSIS, ROUTINE W REFLEX MICROSCOPIC
Bilirubin Urine: NEGATIVE
Hgb urine dipstick: NEGATIVE
Ketones, ur: NEGATIVE
Leukocytes,Ua: NEGATIVE
Nitrite: NEGATIVE
Protein, ur: NEGATIVE
Specific Gravity, Urine: 1.005 (ref 1.001–1.035)
pH: 5.5 (ref 5.0–8.0)

## 2023-02-16 LAB — MICROALBUMIN / CREATININE URINE RATIO
Creatinine, Urine: 11 mg/dL — ABNORMAL LOW (ref 20–320)
Microalb Creat Ratio: 36 mg/g{creat} — ABNORMAL HIGH (ref <30)
Microalb, Ur: 0.4 mg/dL

## 2023-02-16 LAB — MAGNESIUM: Magnesium: 2.1 mg/dL (ref 1.5–2.5)

## 2023-02-16 LAB — HEMOGLOBIN A1C W/OUT EAG: Hgb A1c MFr Bld: 6.3 %{Hb} — ABNORMAL HIGH (ref <5.7)

## 2023-02-16 LAB — TSH: TSH: 1.43 m[IU]/L (ref 0.40–4.50)

## 2023-02-16 LAB — VITAMIN D 25 HYDROXY (VIT D DEFICIENCY, FRACTURES): Vit D, 25-Hydroxy: 64 ng/mL (ref 30–100)

## 2023-02-20 ENCOUNTER — Other Ambulatory Visit: Payer: Self-pay | Admitting: Nurse Practitioner

## 2023-02-20 ENCOUNTER — Other Ambulatory Visit (HOSPITAL_COMMUNITY): Payer: Self-pay | Admitting: Cardiology

## 2023-02-20 ENCOUNTER — Telehealth: Payer: Self-pay | Admitting: Nurse Practitioner

## 2023-02-20 ENCOUNTER — Other Ambulatory Visit (HOSPITAL_COMMUNITY): Payer: Self-pay

## 2023-02-20 ENCOUNTER — Telehealth (HOSPITAL_COMMUNITY): Payer: Self-pay

## 2023-02-20 DIAGNOSIS — K219 Gastro-esophageal reflux disease without esophagitis: Secondary | ICD-10-CM

## 2023-02-20 MED ORDER — PANTOPRAZOLE SODIUM 40 MG PO TBEC: 40.0000 mg | DELAYED_RELEASE_TABLET | Freq: Every day | ORAL | 6 refills | Status: DC

## 2023-02-20 NOTE — Telephone Encounter (Signed)
 Advanced Heart Failure Patient Advocate Encounter  Pt called in concerned for cost of Eplerenone . Pt has an unmet deductible for 2025. Called and discussed options with patient, he is going to check [employer] ([facility] showing $42.02 for 30 days) and will provide billing information to pharmacy when able. Pt will call me if there are any further concerns.  Rachel DEL, CPhT Rx Patient Advocate Phone: [phone]

## 2023-02-20 NOTE — Telephone Encounter (Signed)
 Patient called Dr.McLean's office to have pantoprazole refilled. They told him, his primary care care will have to take over the prescription. If you could please refill to  [id number] -  ([place]), [place] - [address]

## 2023-03-01 ENCOUNTER — Other Ambulatory Visit: Payer: Self-pay | Admitting: Nurse Practitioner

## 2023-03-01 DIAGNOSIS — F419 Anxiety disorder, unspecified: Secondary | ICD-10-CM

## 2023-03-23 ENCOUNTER — Other Ambulatory Visit: Payer: Self-pay

## 2023-03-23 DIAGNOSIS — I428 Other cardiomyopathies: Secondary | ICD-10-CM

## 2023-03-23 MED ORDER — FUROSEMIDE 40 MG PO TABS: 40.0000 mg | ORAL_TABLET | Freq: Every day | ORAL | 0 refills | Status: DC

## 2023-03-29 ENCOUNTER — Other Ambulatory Visit (HOSPITAL_COMMUNITY): Payer: Self-pay

## 2023-03-29 ENCOUNTER — Other Ambulatory Visit (HOSPITAL_COMMUNITY): Payer: Self-pay | Admitting: Cardiology

## 2023-03-29 MED ORDER — SACUBITRIL-VALSARTAN 97-103 MG PO TABS
1.0000 | ORAL_TABLET | Freq: Two times a day (BID) | ORAL | 11 refills | Status: AC
  Filled 2023-03-29: qty 60, 30d supply, fill #0
  Filled 2023-05-19: qty 60, 30d supply, fill #1
  Filled 2023-06-19: qty 60, 30d supply, fill #2
  Filled 2023-07-19: qty 60, 30d supply, fill #3
  Filled 2023-08-18: qty 60, 30d supply, fill #4
  Filled 2023-09-18: qty 60, 30d supply, fill #5
  Filled 2023-10-13: qty 60, 30d supply, fill #6
  Filled 2023-11-17: qty 60, 30d supply, fill #7
  Filled 2023-12-18: qty 60, 30d supply, fill #8
  Filled 2024-01-17: qty 60, 30d supply, fill #9
  Filled 2024-02-15: qty 60, 30d supply, fill #10
  Filled 2024-03-12: qty 60, 30d supply, fill #11

## 2023-03-29 MED ORDER — EMPAGLIFLOZIN 10 MG PO TABS
10.0000 mg | ORAL_TABLET | Freq: Every day | ORAL | 11 refills | Status: AC
  Filled 2023-03-29: qty 60, 60d supply, fill #0
  Filled 2023-05-31: qty 60, 60d supply, fill #1
  Filled 2023-07-31: qty 60, 60d supply, fill #2
  Filled 2023-10-01: qty 60, 60d supply, fill #3
  Filled 2023-11-28: qty 60, 60d supply, fill #4
  Filled 2024-01-29: qty 60, 60d supply, fill #5

## 2023-04-17 ENCOUNTER — Other Ambulatory Visit: Payer: Self-pay | Admitting: Family

## 2023-04-17 DIAGNOSIS — I428 Other cardiomyopathies: Secondary | ICD-10-CM

## 2023-04-19 ENCOUNTER — Other Ambulatory Visit: Payer: Self-pay

## 2023-04-19 DIAGNOSIS — I428 Other cardiomyopathies: Secondary | ICD-10-CM

## 2023-04-19 MED ORDER — FUROSEMIDE 40 MG PO TABS: 40.0000 mg | ORAL_TABLET | Freq: Every day | ORAL | 1 refills | Status: DC

## 2023-05-08 ENCOUNTER — Other Ambulatory Visit: Payer: Self-pay | Admitting: Cardiology

## 2023-05-08 DIAGNOSIS — E785 Hyperlipidemia, unspecified: Secondary | ICD-10-CM

## 2023-05-25 ENCOUNTER — Ambulatory Visit: Payer: Medicare Other | Admitting: Nurse Practitioner

## 2023-06-16 ENCOUNTER — Other Ambulatory Visit: Payer: Self-pay | Admitting: Family

## 2023-06-16 DIAGNOSIS — I428 Other cardiomyopathies: Secondary | ICD-10-CM

## 2023-06-19 ENCOUNTER — Other Ambulatory Visit (HOSPITAL_COMMUNITY): Payer: Self-pay

## 2023-06-26 ENCOUNTER — Telehealth: Payer: Self-pay | Admitting: Internal Medicine

## 2023-06-26 ENCOUNTER — Other Ambulatory Visit (HOSPITAL_COMMUNITY): Payer: Self-pay | Admitting: Cardiology

## 2023-06-26 DIAGNOSIS — I428 Other cardiomyopathies: Secondary | ICD-10-CM

## 2023-06-26 MED ORDER — FUROSEMIDE 40 MG PO TABS: 40.0000 mg | ORAL_TABLET | Freq: Every day | ORAL | 3 refills | Status: DC

## 2023-06-26 MED ORDER — FUROSEMIDE 40 MG PO TABS: 40.0000 mg | ORAL_TABLET | Freq: Every day | ORAL | 6 refills | Status: DC

## 2023-06-26 NOTE — Telephone Encounter (Signed)
 Copied from CRM [id number]. Topic: Clinical - Medication Refill >> Jun 26, 2023  8:48 AM Donald Frost wrote: Medication: furosemide  (LASIX ) 40 MG tablet  Has the patient contacted their pharmacy? No   This is the patient's preferred pharmacy:  [facility] [address] ([address]), [place] - [address] [place] ([place]) [place] [address] Phone: [phone] Fax: [phone]   Is this the correct pharmacy for this prescription? Yes If no, delete pharmacy and type the correct one.   Has the prescription been filled recently? No  Is the patient out of the medication? Yes he ran out yesterday  Has the patient been seen for an appointment in the last year OR does the patient have an upcoming appointment? Yes  Can we respond through MyChart? Yes  Please assist patient further

## 2023-07-11 ENCOUNTER — Encounter: Payer: Self-pay | Admitting: Family Medicine

## 2023-07-11 ENCOUNTER — Ambulatory Visit (INDEPENDENT_AMBULATORY_CARE_PROVIDER_SITE_OTHER): Admitting: Family Medicine

## 2023-07-11 VITALS — BP 121/79 | HR 72 | Temp 97.0°F | Resp 18 | Ht 75.0 in | Wt 247.6 lb

## 2023-07-11 DIAGNOSIS — F419 Anxiety disorder, unspecified: Secondary | ICD-10-CM | POA: Diagnosis not present

## 2023-07-11 DIAGNOSIS — E559 Vitamin D deficiency, unspecified: Secondary | ICD-10-CM

## 2023-07-11 DIAGNOSIS — I2510 Atherosclerotic heart disease of native coronary artery without angina pectoris: Secondary | ICD-10-CM

## 2023-07-11 DIAGNOSIS — N529 Male erectile dysfunction, unspecified: Secondary | ICD-10-CM

## 2023-07-11 DIAGNOSIS — D75839 Thrombocytosis, unspecified: Secondary | ICD-10-CM

## 2023-07-11 DIAGNOSIS — I10 Essential (primary) hypertension: Secondary | ICD-10-CM

## 2023-07-11 DIAGNOSIS — R7309 Other abnormal glucose: Secondary | ICD-10-CM | POA: Diagnosis not present

## 2023-07-11 DIAGNOSIS — D751 Secondary polycythemia: Secondary | ICD-10-CM

## 2023-07-11 DIAGNOSIS — E782 Mixed hyperlipidemia: Secondary | ICD-10-CM

## 2023-07-11 DIAGNOSIS — I471 Supraventricular tachycardia, unspecified: Secondary | ICD-10-CM

## 2023-07-11 NOTE — Progress Notes (Signed)
 Assessment & Plan   Assessment/Plan:   Assessment and Plan Stable Ischemic Heart Disease Ischemic heart disease is well-managed with no recent angina or dyspnea. Recent cardiology evaluations, including ultrasound and EKG, showed no changes. Occasional palpitations have decreased in frequency. Metoprolol  effectively manages symptoms. Eplerenone  is manageable with a 30-day supply due to cost concerns. - Continue metoprolol  and eplerenone  - Follow up with cardiologist as scheduled  Polycythemia Chronic elevated hemoglobin levels potentially related to previous smoking and alcohol consumption. Smoking cessation occurred in 2020. Alcohol intake may affect blood counts; reduction to under 14 beers per week is advised. Potential sleep apnea contribution discussed. Referral to hematology and sleep study suggested for further evaluation. - Refer to hematology - Recommend sleep study for sleep apnea evaluation - Advise reducing alcohol intake to under 14 beers per week  Erectile Dysfunction Erectile dysfunction present, with no treatment due to cardiac concerns. If not on nitrates, a low-dose medication could be considered. Physical activity level supports potential use. Cardiologist consultation advised before initiating treatment. - Discuss treatment options with cardiologist - Consider trial of low-dose medication if cardiologist approves  Anxiety Anxiety is well-controlled on sertraline  100 mg and buspirone  10 mg three times daily. Reports stability with medications and ongoing counseling. - Continue sertraline  100 mg daily - Continue buspirone  10 mg three times daily - Continue counseling sessions  Gastroesophageal Reflux Disease (GERD) Heartburn is being managed.  General Health Maintenance Lab work from January was mostly normal, with stable PSA from previous years. Vitamin D  supplementation continues for mild deficiency. Slightly abnormal blood counts for the past three years may  warrant further investigation. PSA testing as needed is advised. - Consider PSA testing as needed - Continue vitamin D  supplementation  Follow-up Cardiology follow-up scheduled for the 16th. Follow-up in six months suggested unless additional lab work is required by cardiologist. - Schedule follow-up appointment in six months - Coordinate with cardiologist for any additional lab work needed    There are no discontinued medications.  No follow-ups on file.        Subjective:   Encounter date: 07/11/2023  Tyler Berger is a 63 y.o. male who has Hyperlipidemia; Essential hypertension; Anxiety; Vitamin D  deficiency; Medication management; Varicose veins of both lower extremities; LBBB (left bundle branch block); SVT (supraventricular tachycardia) (HCC); Former smoker (30 pack year history, quit 03/2018); Non-ischemic cardiomyopathy (HCC); Chronic combined systolic and diastolic congestive heart failure (HCC); S/P aortic valve replacement with bioprosthetic valve + resection ascending thoracic aortic aneurysm; S/P ascending aortic aneurysm repair; PVC (premature ventricular contraction); Coronary artery disease involving native coronary artery of native heart without angina pectoris; Obesity (BMI 30.0-34.9); Abnormal glucose; Thrombocytosis; Erythrocytosis; and Erectile disorder on their problem list..   He  has a past medical history of Anxiety, Aortic root aneurysm, Aortic stenosis, Ascending aortic aneurysm (HCC), CAD (coronary artery disease), Chronic combined systolic and diastolic congestive heart failure (HCC), Depression, Diabetes mellitus without complication (HCC), Dilated aortic root (HCC), GERD (gastroesophageal reflux disease), Hyperlipidemia, Hypertension, Non-ischemic cardiomyopathy (HCC), Nonischemic cardiomyopathy (HCC), S/P aortic valve replacement with bioprosthetic valve (04/10/2018), S/P ascending aortic aneurysm repair (04/10/2018), Seasonal allergies, SVT (supraventricular  tachycardia) (HCC), Viral myocarditis (2004), and Vitamin D  deficiency.Tyler Berger   He presents with chief complaint of Establish Care (Pt is fasting today. //HM due- Prevnar 20 and shingles vaccine( Pt declined) /) .  The patient is a 63 year old with a history of open heart surgery who presents for a follow-up visit.  They have a history of open heart surgery  and are under the care of a heart specialist and an electrophysiologist for supraventricular tachycardia. They report no chest pain or shortness of breath. Recent diagnostic studies, including ultrasound and EKGs, showed no changes in heart function, and the frequency of extra heartbeats has decreased. They are currently taking metoprolol  and eplerenone , managing the cost of eplerenone  by obtaining a 30-day supply instead of a 90-day supply.  They are on disability due to their heart condition and manage anxiety and heartburn with sertraline  100 mg daily and buspirone  10 mg three times a day, which they find stable. They also take furosemide , which was recently refilled.  They have a history of vitamin D  deficiency and take a supplement of 5000 IU daily. Blood counts have been slightly high for hemoglobin and low for platelets over the past few years, with no hematologist workup.  Socially, they live in [place] with family nearby, including a sister, son, and daughter. They enjoy yard work and maintain a Brewing technologist garden. They occasionally drink beer, sometimes exceeding 14 beers a week, and drink a lot of water. They quit smoking in 2020 following heart surgery.  They have not had a sleep study and do not have a diagnosis of sleep apnea. No allergies this year. They have not been informed of any restrictions regarding erectile dysfunction medications but have assumed they could not take them due to their heart condition.    07/11/2023   10:44 AM 02/12/2018    3:25 PM  GAD 7 : Generalized Anxiety Score  Nervous, Anxious, on Edge 0 3   Control/stop worrying 0 2  Worry too much - different things 0 2  Trouble relaxing 1 0  Restless 1 0  Easily annoyed or irritable 0 0  Afraid - awful might happen 0 3  Total GAD 7 Score 2 10  Anxiety Difficulty Not difficult at all        07/11/2023   10:44 AM 11/11/2019    2:19 PM 02/12/2018    3:23 PM  Depression screen PHQ 2/9  Decreased Interest 0 1 1  Down, Depressed, Hopeless 0 1 1  PHQ - 2 Score 0 2 2  Altered sleeping 0 0 1  Tired, decreased energy 0 3 3  Change in appetite 0 0 1  Feeling bad or failure about yourself  0 1 3  Trouble concentrating 0 0 0  Moving slowly or fidgety/restless 0 0 0  Suicidal thoughts 0 0 0  PHQ-9 Score 0 6 10  Difficult doing work/chores Not difficult at all Somewhat difficult Not difficult at all      ROS  Past Surgical History:  Procedure Laterality Date   AORTIC VALVE REPLACEMENT N/A 04/10/2018   Procedure: Aortic Valve Replacement (Avr), USING INSPIRIS ;  Surgeon: Gardenia Jump, MD;  Location: [facility] OR;  Service: Open Heart Surgery;  Laterality: N/A;   ASCENDING AORTIC ROOT REPLACEMENT N/A 04/10/2018   Procedure: RESECTION AND GRAFTING OF ASCENDING AORTIC ROOT ANUERYSM (SUPRACORONARY);  Surgeon: Gardenia Jump, MD;  Location: [facility] OR;  Service: Open Heart Surgery;  Laterality: N/A;   CARDIAC VALVE REPLACEMENT     INGUINAL HERNIA REPAIR Left    MULTIPLE EXTRACTIONS WITH ALVEOLOPLASTY N/A 04/05/2018   Procedure: Extraction of tooth #'s 3, 12, 14,19, 23, 31 and 32 with alveoloplasty and gross debridement of remaining teeth;  Surgeon: Carol Chroman, DDS;  Location: [facility] OR;  Service: Oral Surgery;  Laterality: N/A;   ORCHIECTOMY Left 02/08/1980   RIGHT HEART CATH  AND CORONARY ANGIOGRAPHY N/A 03/28/2018   Procedure: RIGHT HEART CATH AND CORONARY ANGIOGRAPHY;  Surgeon: Darlis Eisenmenger, MD;  Location: [facility] LAB;  Service: Cardiovascular;  Laterality: N/A;   TEE WITHOUT CARDIOVERSION N/A 04/10/2018   Procedure:  TRANSESOPHAGEAL ECHOCARDIOGRAM (TEE);  Surgeon: Gardenia Jump, MD;  Location: [facility] OR;  Service: Open Heart Surgery;  Laterality: N/A;   VARICOSE VEIN SURGERY Left 05/09/1998    Outpatient Medications Prior to Visit  Medication Sig Dispense Refill   acetaminophen  (TYLENOL ) 500 MG tablet Take 1,000 mg by mouth every 6 (six) hours as needed for mild pain or headache.     aspirin  EC 81 MG tablet Take 81 mg by mouth daily. Swallow whole.     busPIRone  (BUSPAR ) 10 MG tablet TAKE 1 TABLET BY MOUTH THREE TIMES DAILY FOR ANXIETY 270 tablet 0   cetirizine (ZYRTEC) 10 MG tablet Take 10 mg by mouth daily.     Cholecalciferol (VITAMIN D3) 125 MCG (5000 UT) TABS Take 5,000 Units by mouth daily.      empagliflozin  (JARDIANCE ) 10 MG TABS tablet Take 1 tablet (10 mg total) by mouth daily. 60 tablet 11   eplerenone  (INSPRA ) 50 MG tablet Take 1 tablet by mouth once daily 90 tablet 3   furosemide  (LASIX ) 40 MG tablet Take 1 tablet (40 mg total) by mouth daily. 90 tablet 3   metoprolol  succinate (TOPROL -XL) 100 MG 24 hr tablet Take 1 tablet (100 mg total) by mouth 2 (two) times daily in the morning and at bedtime. Take with or immediately following a meal. 180 tablet 3   mexiletine (MEXITIL ) 150 MG capsule Take 2 capsules by mouth twice daily 360 capsule 3   pantoprazole  (PROTONIX ) 40 MG tablet Take 1 tablet (40 mg total) by mouth daily. 30 tablet 6   potassium chloride  (KLOR-CON ) 10 MEQ tablet Take 1 tablet by mouth once daily 90 tablet 3   rosuvastatin  (CRESTOR ) 40 MG tablet Take 1 tablet by mouth once daily 90 tablet 0   sacubitril -valsartan  (ENTRESTO ) 97-103 MG Take 1 tablet by mouth 2 (two) times daily. 60 tablet 11   sertraline  (ZOLOFT ) 100 MG tablet Take 1 tablet by mouth once daily 90 tablet 2   ezetimibe  (ZETIA ) 10 MG tablet Take 1 tablet (10 mg total) by mouth daily. 90 tablet 3   No facility-administered medications prior to visit.    Family History  Problem Relation Age of Onset   Diabetes  Mother    Hypertension Mother    Alzheimer's disease Mother    Breast cancer Mother 22       breast   Esophageal cancer Father 13   Colon polyps Father    Hypertension Father    Cancer Father        laryngeal, former smoker   Alcohol abuse Father    Hypertension Sister    Colon polyps Maternal Grandmother 48   Colon cancer Maternal Grandmother 63   Diabetes Daughter    Diabetes Son    Rectal cancer Neg Hx    Stomach cancer Neg Hx     Social History   Socioeconomic History   Marital status: Married    Spouse name: Not on file   Number of children: 2   Years of education: Not on file   Highest education level: Associate degree: occupational, Scientist, product/process development, or vocational program  Occupational History   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  Tobacco Use   Smoking status: Former    Current packs/day: 0.00  Average packs/day: 0.7 packs/day for 40.1 years (30.1 ttl pk-yrs)    Types: Cigarettes    Start date: 02/07/1978    Quit date: 03/28/2018    Years since quitting: 5.2   Smokeless tobacco: Never  Vaping Use   Vaping status: Never Used  Substance and Sexual Activity   Alcohol use: Yes    Alcohol/week: 8.0 standard drinks of alcohol    Types: 8 Cans of beer per week   Drug use: Never   Sexual activity: Yes    Partners: Male    Birth control/protection: Condom, None  Other Topics Concern   Not on file  Social History Narrative   Married, med Best boy.  Two children.     Social Drivers of Health   Financial Resource Strain: Medium Risk (07/10/2023)   Overall Financial Resource Strain (CARDIA)    Difficulty of Paying Living Expenses: Somewhat hard  Food Insecurity: No Food Insecurity (07/10/2023)   Hunger Vital Sign    Worried About Running Out of Food in the Last Year: Never true    Ran Out of Food in the Last Year: Never true  Transportation Needs: No Transportation Needs (07/10/2023)   PRAPARE - Administrator, Civil Service (Medical): No    Lack of  Transportation (Non-Medical): No  Physical Activity: Insufficiently Active (07/10/2023)   Exercise Vital Sign    Days of Exercise per Week: 3 days    Minutes of Exercise per Session: 30 min  Stress: No Stress Concern Present (07/10/2023)   [employer] of Occupational Health - Occupational Stress Questionnaire    Feeling of Stress : Not at all  Social Connections: Moderately Integrated (07/10/2023)   Social Connection and Isolation Panel [NHANES]    Frequency of Communication with Friends and Family: More than three times a week    Frequency of Social Gatherings with Friends and Family: Patient declined    Attends Religious Services: 1 to 4 times per year    Active Member of [employer] or Organizations: No    Attends Engineer, structural: Not on file    Marital Status: Married  Catering manager Violence: Not on file                                                                                                  Objective:  Physical Exam: BP 121/79 (BP Location: Left Arm, Patient Position: Sitting, Cuff Size: Large)   Pulse 72   Temp (!) 97 F (36.1 C) (Temporal)   Resp 18   Ht 6\' 3"  (1.905 m)   Wt 247 lb 9.6 oz (112.3 kg)   SpO2 98%   BMI 30.95 kg/m    Physical Exam GENERAL: Alert, cooperative, well developed, no acute distress HEENT: Normocephalic, normal oropharynx, moist mucous membranes CHEST: Clear to auscultation bilaterally, No wheezes, rhonchi, or crackles CARDIOVASCULAR: Normal heart rate and rhythm, S1 and S2 normal without murmurs ABDOMEN: Soft, non-tender, non-distended, without organomegaly, Normal bowel sounds EXTREMITIES: No cyanosis or edema NEUROLOGICAL: Cranial nerves grossly intact, Moves all extremities without gross motor or sensory deficit  Results LABS Hb: Elevated (  02/2023) PLT: 134 (02/2023)  DIAGNOSTIC Echocardiography: No changes in cardiac structure or function EKG: Decreased frequency of premature atrial contractions  Social  History - Quit smoking in 2020 - Drinks beer occasionally, three or four times a week  No results found.  No results found for this or any previous visit (from the past 2160 hours).      Carnell Christian, MD, MS

## 2023-07-12 ENCOUNTER — Other Ambulatory Visit (HOSPITAL_COMMUNITY): Payer: Self-pay | Admitting: Cardiology

## 2023-07-13 ENCOUNTER — Other Ambulatory Visit (HOSPITAL_COMMUNITY): Payer: Self-pay

## 2023-07-13 MED ORDER — METOPROLOL SUCCINATE ER 100 MG PO TB24
100.0000 mg | ORAL_TABLET | Freq: Two times a day (BID) | ORAL | 0 refills | Status: DC
  Filled 2023-07-13: qty 180, 90d supply, fill #0

## 2023-07-19 ENCOUNTER — Other Ambulatory Visit (HOSPITAL_COMMUNITY): Payer: Self-pay

## 2023-08-02 ENCOUNTER — Other Ambulatory Visit: Payer: Self-pay | Admitting: Cardiology

## 2023-08-02 DIAGNOSIS — E785 Hyperlipidemia, unspecified: Secondary | ICD-10-CM

## 2023-08-04 ENCOUNTER — Telehealth (HOSPITAL_COMMUNITY): Payer: Self-pay

## 2023-08-04 DIAGNOSIS — E785 Hyperlipidemia, unspecified: Secondary | ICD-10-CM

## 2023-08-04 MED ORDER — ROSUVASTATIN CALCIUM 40 MG PO TABS: 40.0000 mg | ORAL_TABLET | Freq: Every day | ORAL | 0 refills | Status: DC

## 2023-08-04 NOTE — Telephone Encounter (Signed)
 Received call from patient to triage line requesting refill on Rosuvastatin  40 mg to patient preferred pharmacy.   Refill sent to patient preferred pharmacy.   Advised patient to call back to office with any issues, questions, or concerns. Patient verbalized understanding.

## 2023-08-18 ENCOUNTER — Other Ambulatory Visit (HOSPITAL_COMMUNITY): Payer: Self-pay

## 2023-08-24 ENCOUNTER — Encounter (HOSPITAL_COMMUNITY): Payer: Self-pay | Admitting: Cardiology

## 2023-08-24 ENCOUNTER — Ambulatory Visit (HOSPITAL_COMMUNITY)
Admission: RE | Admit: 2023-08-24 | Discharge: 2023-08-24 | Disposition: A | Discharge status: Home / Self Care | Source: Ambulatory Visit | Attending: Cardiology | Admitting: Cardiology

## 2023-08-24 VITALS — BP 120/78 | HR 83 | Wt 250.2 lb

## 2023-08-24 DIAGNOSIS — I5042 Chronic combined systolic (congestive) and diastolic (congestive) heart failure: Secondary | ICD-10-CM

## 2023-08-24 DIAGNOSIS — I110 Hypertensive heart disease with heart failure: Secondary | ICD-10-CM | POA: Insufficient documentation

## 2023-08-24 DIAGNOSIS — Z5986 Financial insecurity: Secondary | ICD-10-CM | POA: Diagnosis not present

## 2023-08-24 DIAGNOSIS — I5022 Chronic systolic (congestive) heart failure: Secondary | ICD-10-CM | POA: Diagnosis not present

## 2023-08-24 DIAGNOSIS — Z79899 Other long term (current) drug therapy: Secondary | ICD-10-CM | POA: Diagnosis not present

## 2023-08-24 DIAGNOSIS — Z953 Presence of xenogenic heart valve: Secondary | ICD-10-CM | POA: Diagnosis not present

## 2023-08-24 DIAGNOSIS — I447 Left bundle-branch block, unspecified: Secondary | ICD-10-CM | POA: Insufficient documentation

## 2023-08-24 DIAGNOSIS — I2510 Atherosclerotic heart disease of native coronary artery without angina pectoris: Secondary | ICD-10-CM | POA: Diagnosis not present

## 2023-08-24 DIAGNOSIS — N62 Hypertrophy of breast: Secondary | ICD-10-CM | POA: Insufficient documentation

## 2023-08-24 DIAGNOSIS — I493 Ventricular premature depolarization: Secondary | ICD-10-CM | POA: Diagnosis not present

## 2023-08-24 DIAGNOSIS — I428 Other cardiomyopathies: Secondary | ICD-10-CM | POA: Diagnosis not present

## 2023-08-24 DIAGNOSIS — F1010 Alcohol abuse, uncomplicated: Secondary | ICD-10-CM | POA: Insufficient documentation

## 2023-08-24 DIAGNOSIS — Z7984 Long term (current) use of oral hypoglycemic drugs: Secondary | ICD-10-CM | POA: Diagnosis not present

## 2023-08-24 LAB — BASIC METABOLIC PANEL WITH GFR
Anion gap: 11 (ref 5–15)
BUN: 13 mg/dL (ref 8–23)
CO2: 24 mmol/L (ref 22–32)
Calcium: 9.6 mg/dL (ref 8.9–10.3)
Chloride: 103 mmol/L (ref 98–111)
Creatinine, Ser: 1 mg/dL (ref 0.61–1.24)
GFR, Estimated: >60 mL/min (ref >60)
Glucose, Bld: 144 mg/dL — ABNORMAL HIGH (ref 70–99)
Potassium: 5 mmol/L (ref 3.5–5.1)
Sodium: 138 mmol/L (ref 135–145)

## 2023-08-24 LAB — BRAIN NATRIURETIC PEPTIDE: B Natriuretic Peptide: 271.9 pg/mL — ABNORMAL HIGH (ref 0.0–100.0)

## 2023-08-24 LAB — LIPID PANEL
Cholesterol: 148 mg/dL (ref 0–200)
HDL: 65 mg/dL (ref >40)
LDL Cholesterol: 72 mg/dL (ref 0–99)
Total CHOL/HDL Ratio: 2.3 ratio
Triglycerides: 56 mg/dL (ref <150)
VLDL: 11 mg/dL (ref 0–40)

## 2023-08-24 MED ORDER — SILDENAFIL CITRATE 50 MG PO TABS: 50.0000 mg | ORAL_TABLET | Freq: Every day | ORAL | 0 refills | Status: AC | PRN

## 2023-08-24 MED ORDER — FUROSEMIDE 40 MG PO TABS: 20.0000 mg | ORAL_TABLET | Freq: Every day | ORAL | Status: AC

## 2023-08-24 NOTE — Patient Instructions (Signed)
 DECREASE Lasix  to 20 mg daily.  STOP Potassium.  Labs done today, your results will be available in MyChart, we will contact you for abnormal readings.  Your physician recommends that you schedule a follow-up appointment in: 1 year ( July 2026) ** PLEASE CALL THE OFFICE IN MAY 2026 TO ARRANGE YOUR FOLLOW UP APPOINTMENT.**  If you have any questions or concerns before your next appointment please send us  a message through [place] or call our office at [phone].    TO LEAVE A MESSAGE FOR THE NURSE SELECT OPTION 2, PLEASE LEAVE A MESSAGE INCLUDING: YOUR NAME DATE OF BIRTH CALL BACK NUMBER REASON FOR CALL**this is important as we prioritize the call backs  YOU WILL RECEIVE A CALL BACK THE SAME DAY AS LONG AS YOU CALL BEFORE 4:00 PM  At the [facility], you and your health needs are our priority. As part of our continuing mission to provide you with exceptional heart care, we have created designated Provider Care Teams. These Care Teams include your primary Cardiologist (physician) and Advanced Practice Providers (APPs- Physician Assistants and Nurse Practitioners) who all work together to provide you with the care you need, when you need it.   You may see any of the following providers on your designated Care Team at your next follow up: Dr Toribio Fuel Dr Ezra Shuck Dr. Ria Commander Dr. Morene Brownie Amy Lenetta, NP Caffie Shed, [place] [facility] [place], [place] Beckey Coe, NP [place] Lee, NP Ellouise Class, NP Tinnie Redman, PharmD Jaun Bash, PharmD   Please be sure to bring in all your medications bottles to every appointment.    Thank you for choosing [facility]

## 2023-08-24 NOTE — Progress Notes (Signed)
 PCP: Sebastian Beverley NOVAK, MD HF Cardiology: Dr Rolan   Chief complaint: CHF  HPI: 63 y.o. with history of cardiomyopathy and aortic stenosis returns for followup in CHF.  In 2004, he was diagnosed with nonischemic cardiomyopathy, EF 20% at that time.  His EF recovered back up to 60-65%, but he was noted to have moderate aortic stenosis with a bicuspid valve. He was not seen by cardiology for a number of years.    He was in the ER in 1/20 with SVT that converted spontaneously.  He was set up for an echo and sent back to cardiology.  Echo showed EF 25-30%, diffuse hypokinesis, probably severe aortic stenosis with bicuspid aortic valve.  He was referred for RHC/LHC.     RHC/LHC was done 2/20.  He had moderate coronary disease but no critical stenoses and no disease to explain his cardiomyopathy. RHC showed markedly elevated PCWP, mixed pulmonary venous/pulmonary arterial hypertension, and low CI at 1.7.  I decided to admit him for diuresis, optimization of meds, and further workup of aortic stenosis.  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  He was evaluated by the structural heart team but valve area thought to be too big for the existing TAVR valves.  Therefore, surgical AVR was recommended.    On 04/10/18 he underwent bioprosthetic AVR with supracoronary ascending aorta replacement  by Dr Dusty.  He was placed on amiodarone  due to increased PVCs post-op.  Discharge weight was 224 pounds.   Echo in 8/20 showed EF 40-45% with mild LVH, septal-lateral dyssynchrony, normal bioprosthetic aortic valve.  Zio patch in 12/20 showed 15.9% PVCs and NSVT runs.  Amiodarone  was started. He saw Dr. Inocencio and amiodarone  was stopped, mexiletine was started.  Zio patch in 4/21 showed PVC burden down to 3.4%.  Echo in 10/21 showed EF 55-60%, normal RV, bioprosthetic aortic valve with mean gradient 10 mmHg.  Zio monitor in 12/22 showed 9.7% PVCs, mexiletine was increased by EP.  Echo 1/23 EF 55-60% with  bioprosthetic aortic valve mean gradient 12 mmHg, normal RV.   Echo 4/24 EF 55-60%, stable aortic valve prosthesis.  Zio 4/24 showed mostly NSR, 8.1% PVCs (lower than in the past)  Today he returns for HF follow up. No PVCs on ECG today and he denies palpitations.  No exertional dyspnea or chest pain.  Mows his grass and does yardwork.  He is not smoking but he still drinks about 3 beers/day.  Weight down 2 lbs.  No lightheadedness. .  ECG (personally reviewed): NSR, IVCD 140 msec  Labs (8/20): LDL 69, HDL 49, K 3.8, creatinine 8.77  Labs (9/20): K 4.2, creatinine 1.08 Labs (12/20): K 3.4, creatinine 1.22  Labs (3/21): K 4.4, creatinine 1.24 Labs (8/21): K 4.6, creatinine 0.93, LDL 84, HDL 50 Labs (11/22): LDL 64, K 4.3, creatinine 9.04 Labs (11/23): K 4.6, creatinine 0.82, LDL 54, TSH normal Labs (3/24): K 4.4, creatinine 1.06 Labs (11/24): LDL 52 Labs (1/25): K 4, creatinine 0.88  PMH: 1. SVT 2. HTN 3. Prior ETOH abuse 4. Prior smoking 5. Chronic systolic CHF: Nonischemic cardiomyopathy.  - Echo (2004) with EF 20%.  - Echo (1/20): EF 25-30%, mild LVH, moderate LV dilation, bicuspid aortic valve with severe AS.  - RHC (2/20): mean RA 12, PA 70/32 mean 47, mean PCWP 30, CI 1.72, PVR 4.2.  - Echo (9/20): EF 40-45%, mild LVH, septal-lateral dyssynchrony, normal RV size and systolic function, bioprosthetic AVR functions normally.  - Echo (10/21): EF 55-60%, normal RV,  bioprosthetic aortic valve with mean gradient 10 mmHg. - Echo (1/23): EF 55-60% with bioprosthetic aortic valve mean gradient 12 mmHg, normal RV. - Echo (4/24):  EF 55-60%, stable aortic valve prosthesis with AoV mean gradient 12 mmHg, normal RV 6. LBBB 7. CAD: LHC (2/20) witih 80% stenosis small D1, 50% stenosis moderate D2, 50% mid LCx, 50% distal RCA.  8. Biscuspid aortic valve disorder: Severe AS and dilated ascending aorta.   - 3/20 bioprosthetic AVR + supracoronary ascending aorta replacement.  9. PVCs: Zio  patch 12/20 showed 15.9% PVCs, NSVT runs.  - Zio patch (4/21): 3.4% PVCs, multiple morphologies.  - Zio patch (12/22): 9.7% PVCs - Zio patch (4/24): 8.1% PVCs  ROS: All systems negative except as listed in HPI, PMH and Problem List.  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  Tobacco Use   Smoking status: Former    Current packs/day: 0.00    Average packs/day: 0.7 packs/day for 40.1 years (30.1 ttl pk-yrs)    Types: Cigarettes    Start date: 02/07/1978    Quit date: 03/28/2018    Years since quitting: 5.4   Smokeless tobacco: Never  Vaping Use   Vaping status: Never Used  Substance and Sexual Activity   Alcohol use: Yes    Alcohol/week: 8.0 standard drinks of alcohol    Types: 8 Cans of beer per week   Drug use: Never   Sexual activity: Yes    Partners: Male    Birth control/protection: Condom, None  Other Topics Concern   Not on file  Social History Narrative   Married, med Best boy.  Two children.     Social Drivers of Health   Financial Resource Strain: Medium Risk (07/10/2023)   Overall Financial Resource Strain (CARDIA)    Difficulty of Paying Living Expenses: Somewhat hard  Food Insecurity: No Food Insecurity (07/10/2023)   Hunger Vital Sign    Worried About Running Out of Food in the Last Year: Never true    Ran Out of Food in the Last Year: Never true  Transportation Needs: No Transportation Needs (07/10/2023)   PRAPARE - Administrator, Civil Service (Medical): No    Lack of Transportation (Non-Medical): No  Physical Activity: Insufficiently Active (07/10/2023)   Exercise Vital Sign    Days of Exercise per Week: 3 days    Minutes of Exercise per Session: 30 min  Stress: No Stress  Concern Present (07/10/2023)   [employer] of Occupational Health - Occupational Stress Questionnaire    Feeling of Stress : Not at all  Social Connections: Moderately Integrated (07/10/2023)   Social Connection and Isolation Panel    Frequency of Communication with Friends and Family: More than three times a week    Frequency of Social Gatherings with Friends and Family: Patient declined    Attends Religious Services: 1 to 4 times per year    Active Member of [employer] or Organizations: No    Attends Engineer, structural: Not on file    Marital Status: Married  Catering manager Violence: Not on file   FH:  Family History  Problem Relation Age of Onset   Diabetes Mother    Hypertension Mother  Alzheimer's disease Mother    Breast cancer Mother 22       breast   Esophageal cancer Father 19   Colon polyps Father    Hypertension Father    Cancer Father        laryngeal, former smoker   Alcohol abuse Father    Hypertension Sister    Colon polyps Maternal Grandmother 55   Colon cancer Maternal Grandmother 71   Diabetes Daughter    Diabetes Son    Rectal cancer Neg Hx    Stomach cancer Neg Hx    Current Outpatient Medications  Medication Sig Dispense Refill   acetaminophen  (TYLENOL ) 500 MG tablet Take 1,000 mg by mouth every 6 (six) hours as needed for mild pain or headache.     aspirin  EC 81 MG tablet Take 81 mg by mouth daily. Swallow whole.     busPIRone  (BUSPAR ) 10 MG tablet TAKE 1 TABLET BY MOUTH THREE TIMES DAILY FOR ANXIETY 270 tablet 0   cetirizine (ZYRTEC) 10 MG tablet Take 10 mg by mouth daily.     Cholecalciferol (VITAMIN D3) 125 MCG (5000 UT) TABS Take 5,000 Units by mouth daily.      empagliflozin  (JARDIANCE ) 10 MG TABS tablet Take 1 tablet (10 mg total) by mouth daily. 60 tablet 11   eplerenone  (INSPRA ) 50 MG tablet Take 1 tablet by mouth once daily 90 tablet 3   ezetimibe  (ZETIA ) 10 MG tablet Take 1 tablet (10 mg total) by mouth daily. 90 tablet 3    metoprolol  succinate (TOPROL -XL) 100 MG 24 hr tablet Take 1 tablet (100 mg total) by mouth 2 (two) times daily. NEEDS FOLLOW UP APPOINTMENT FOR MORE REFILLS 180 tablet 0   mexiletine (MEXITIL ) 150 MG capsule Take 2 capsules by mouth twice daily 360 capsule 3   pantoprazole  (PROTONIX ) 40 MG tablet Take 1 tablet (40 mg total) by mouth daily. 30 tablet 6   rosuvastatin  (CRESTOR ) 40 MG tablet Take 1 tablet (40 mg total) by mouth daily. 90 tablet 0   sacubitril -valsartan  (ENTRESTO ) 97-103 MG Take 1 tablet by mouth 2 (two) times daily. 60 tablet 11   sertraline  (ZOLOFT ) 100 MG tablet Take 1 tablet by mouth once daily 90 tablet 2   sildenafil  (VIAGRA ) 50 MG tablet Take 1 tablet (50 mg total) by mouth daily as needed for erectile dysfunction. 20 tablet 0   furosemide  (LASIX ) 40 MG tablet Take 0.5 tablets (20 mg total) by mouth daily.     No current facility-administered medications for this encounter.   BP 120/78   Pulse 83   Wt 113.5 kg (250 lb 3.2 oz)   SpO2 98%   BMI 31.27 kg/m   Wt Readings from Last 3 Encounters:  08/24/23 113.5 kg (250 lb 3.2 oz)  07/11/23 112.3 kg (247 lb 9.6 oz)  02/15/23 113.6 kg (250 lb 6.4 oz)   PHYSICAL EXAM: General: NAD Neck: No JVD, no thyromegaly or thyroid  nodule.  Lungs: Clear to auscultation bilaterally with normal respiratory effort. CV: Nondisplaced PMI.  Heart regular S1/S2, no S3/S4, no murmur.  No peripheral edema.  No carotid bruit.  Normal pedal pulses.  Abdomen: Soft, nontender, no hepatosplenomegaly, no distention.  Skin: Intact without lesions or rashes.  Neurologic: Alert and oriented x 3.  Psych: Normal affect. Extremities: No clubbing or cyanosis.  HEENT: Normal.   ASSESSMENT & PLAN: 1. Chronic systolic CHF: Nonischemic cardiomyopathy. This pre-existed severe AS, ?if PVCs play a role.  Echo in 9/20 showed EF up to  40-45%, and echo 1/23 showed EF 55-60%.  Echo 4/24 showed EF 55-60%, stable aortic valve prosthesis. NYHA class I, not volume  overloaded on exam.  - Continue Entresto  97/103 bid. BMET today.  - Continue Toprol  XL 100 mg bid.   - Continue eplerenone  50 mg daily (painful gynecomastia with spironolactone , now resolved).   - I will decrease Lasix  to 20 mg daily and stop K supplement.  He can probably stop Lasix  in the future.  - Continue empagliflozin  10 mg daily.  2. Bicuspid aortic valve disorder: Severe AS, now s/p surgical AVR (annulus too large for TAVR) and ascending aorta replacement.  Echo 1/23 and 4/24 showed a normally functioning bioprosthetic aortic valve.  - He will need endocarditis prophylaxis with dental work.  3. ETOH abuse: Still drinking 3 beers/day.  Discussed cutting back.  4. PVCs: 15.9% PVCs on 12/20 Zio patch (symptomatic).  Amiodarone  started with improvement.  To prevent long-term amiodarone  use, he was transitioned to mexiletine. Repeat Zio patch in 4/21 showed PVC burden down to 3.4%.  Multiple morphologies of PVCs, would not be amenable to ablation.  Zio in 12/22 showed 9.7% PVCs => EP increased dose of mexiletine.  Zio 4/24 showed 8.1% PVC burden. He denies palpitations. No PVCs on today's ECG.  - Continue mexiletine 300 mg bid.  - Cut back on ETOH. 5. CAD: Moderate nonobstructive CAD on 2/20 cath.  No chest pain.  I do not think that this contributed to his cardiomyopathy appreciably.  - Continue Crestor , check lipids today.  - Continue ASA.   6. LBBB-like IVCD: Chronic.   Follow up in 1 year but will need BMET q3 months.   I spent 31 minutes reviewing records, interviewing/examining patient, and managing order   Ezra Shuck  08/24/2023

## 2023-08-25 ENCOUNTER — Ambulatory Visit (HOSPITAL_COMMUNITY): Payer: Self-pay | Admitting: Cardiology

## 2023-08-25 DIAGNOSIS — E785 Hyperlipidemia, unspecified: Secondary | ICD-10-CM

## 2023-08-26 ENCOUNTER — Other Ambulatory Visit: Payer: Self-pay | Admitting: Cardiology

## 2023-09-02 ENCOUNTER — Other Ambulatory Visit: Payer: Self-pay | Admitting: Nurse Practitioner

## 2023-09-08 ENCOUNTER — Telehealth: Payer: Self-pay

## 2023-09-08 ENCOUNTER — Other Ambulatory Visit: Payer: Self-pay

## 2023-09-08 MED ORDER — SERTRALINE HCL 100 MG PO TABS: 100.0000 mg | ORAL_TABLET | Freq: Every day | ORAL | 2 refills | Status: DC

## 2023-09-08 NOTE — Telephone Encounter (Signed)
 RX was refilled and sent to pharmacy. Pt was also called and notified; left brief VM.

## 2023-09-08 NOTE — Telephone Encounter (Signed)
 Copied from CRM [id number]. Topic: Clinical - Medication Question >> Sep 08, 2023 11:26 AM Revonda D wrote: Reason for CRM: Pt is calling to get a refill for the sertraline  (ZOLOFT ) 100 MG tablet. Pt stated that the pharmacy sent over multiple request but hasn't received a response. Pt would like to get the medication approved today if possible and would like a callback with an update.

## 2023-10-01 ENCOUNTER — Other Ambulatory Visit: Payer: Self-pay | Admitting: Nurse Practitioner

## 2023-10-01 DIAGNOSIS — K219 Gastro-esophageal reflux disease without esophagitis: Secondary | ICD-10-CM

## 2023-10-05 ENCOUNTER — Other Ambulatory Visit: Payer: Self-pay | Admitting: Family Medicine

## 2023-10-05 DIAGNOSIS — K219 Gastro-esophageal reflux disease without esophagitis: Secondary | ICD-10-CM

## 2023-10-05 NOTE — Telephone Encounter (Signed)
 Copied from CRM [id number]. Topic: Clinical - Medication Refill >> Oct 05, 2023 12:14 PM Jayma L wrote: Medication: pantoprazole  (PROTONIX ) 40 MG tablet  Has the patient contacted their pharmacy? Yes (Agent: If no, request that the patient contact the pharmacy for the refill. If patient does not wish to contact the pharmacy document the reason why and proceed with request.) (Agent: If yes, when and what did the pharmacy advise?)  This is the patient's preferred pharmacy:  [facility] [address] ([address]), [place] - [address] [place] ([place]) [place] [address] Phone: [phone] Fax: [phone]    Is this the correct pharmacy for this prescription? Yes If no, delete pharmacy and type the correct one.   Has the prescription been filled recently? No  Is the patient out of the medication? Yes  Has the patient been seen for an appointment in the last year OR does the patient have an upcoming appointment? Yes  Can we respond through MyChart? Yes  Agent: Please be advised that Rx refills may take up to 3 business days. We ask that you follow-up with your pharmacy.

## 2023-10-06 ENCOUNTER — Telehealth: Payer: Self-pay | Admitting: Family Medicine

## 2023-10-06 ENCOUNTER — Other Ambulatory Visit: Payer: Self-pay

## 2023-10-06 DIAGNOSIS — K219 Gastro-esophageal reflux disease without esophagitis: Secondary | ICD-10-CM

## 2023-10-06 MED ORDER — PANTOPRAZOLE SODIUM 40 MG PO TBEC: 40.0000 mg | DELAYED_RELEASE_TABLET | Freq: Every day | ORAL | 1 refills | Status: DC

## 2023-10-06 NOTE — Telephone Encounter (Signed)
 Duplicate request, see refill encounter yesterday for updates.

## 2023-10-06 NOTE — Telephone Encounter (Signed)
 Copied from CRM [id number]. Topic: Clinical - Medication Refill >> Oct 06, 2023  3:06 PM Leah C wrote: Medication: pantoprazole  (PROTONIX ) 40 MG tablet  Has the patient contacted their pharmacy? Yes and [facility] has not received the script. It looks like in the notes there was an e-prescribing error. If that applies to this. The request was not submitted correctly and it has been 3 business days and the patient is out.  Patient is a new patient. His physician that originally filled this passed away.   This is the patient's preferred pharmacy:  [facility] [address] ([address] [place] - [address] [place] ([place]) [place] [address] Phone: [phone] Fax: [phone]   Is this the correct pharmacy for this prescription? Yes If no, delete pharmacy and type the correct one.   Has the prescription been filled recently? No  Is the patient out of the medication? Yes for three days he's been out.   Has the patient been seen for an appointment in the last year OR does the patient have an upcoming appointment? Yes  Can we respond through MyChart? Yes  Agent: Please be advised that Rx refills may take up to 3 business days. We ask that you follow-up with your pharmacy.

## 2023-10-06 NOTE — Telephone Encounter (Signed)
 Rx was refilled and sent to pharmacy on 10/06/2023. Patient was also called to inform and left VM.

## 2023-10-12 ENCOUNTER — Other Ambulatory Visit (HOSPITAL_COMMUNITY): Payer: Self-pay | Admitting: Cardiology

## 2023-10-12 ENCOUNTER — Other Ambulatory Visit (HOSPITAL_COMMUNITY): Payer: Self-pay

## 2023-10-12 MED ORDER — METOPROLOL SUCCINATE ER 100 MG PO TB24
100.0000 mg | ORAL_TABLET | Freq: Two times a day (BID) | ORAL | 0 refills | Status: DC
  Filled 2023-10-12: qty 180, 90d supply, fill #0

## 2023-10-13 ENCOUNTER — Other Ambulatory Visit (HOSPITAL_COMMUNITY): Payer: Self-pay

## 2023-10-20 ENCOUNTER — Ambulatory Visit (HOSPITAL_BASED_OUTPATIENT_CLINIC_OR_DEPARTMENT_OTHER): Admitting: Internal Medicine

## 2023-10-20 ENCOUNTER — Encounter (HOSPITAL_BASED_OUTPATIENT_CLINIC_OR_DEPARTMENT_OTHER): Payer: Self-pay | Admitting: Internal Medicine

## 2023-10-20 VITALS — BP 112/78 | HR 64 | Ht 75.0 in | Wt 249.0 lb

## 2023-10-20 DIAGNOSIS — Z952 Presence of prosthetic heart valve: Secondary | ICD-10-CM

## 2023-10-20 DIAGNOSIS — I428 Other cardiomyopathies: Secondary | ICD-10-CM

## 2023-10-20 DIAGNOSIS — I5042 Chronic combined systolic (congestive) and diastolic (congestive) heart failure: Secondary | ICD-10-CM | POA: Diagnosis not present

## 2023-10-20 DIAGNOSIS — E785 Hyperlipidemia, unspecified: Secondary | ICD-10-CM

## 2023-10-20 DIAGNOSIS — I2510 Atherosclerotic heart disease of native coronary artery without angina pectoris: Secondary | ICD-10-CM

## 2023-10-20 NOTE — Progress Notes (Signed)
 LIPID CLINIC CONSULT NOTE  Chief Complaint:  Manage dyslipidemia  Primary Care Physician: Sebastian Beverley NOVAK, MD  Primary Cardiologist:  Lynwood Schilling, MD  HPI:  Tyler Berger is a 63 y.o. male who is being seen today for the evaluation of dyslipidemia at the request of Rolan Ezra RAMAN, MD. this is a pleasant 63 year old male kindly referred through the heart failure clinic for evaluation management of dyslipidemia.  He has a history of moderate nonobstructive coronary disease and was thought to be a nonischemic cardiomyopathy.  From what I can tell he had an LVEF of around 40 to 45% with severe aortic stenosis secondary to bicuspid aortic valve around 2020 and underwent aortic valve and root surgery.  Subsequently his LVEF improved although he has been on a number of heart failure medications.  In addition he is on lipid-lowering therapy including rosuvastatin  40 mg daily and Zetia  10 mg daily.  He does have some chronic alcohol use although he admits its only a few beers a day.  His cholesterols generally been well-controlled.  He is referred to see about whether or not he might qualify to add a PCSK9 inhibitor.  9 months ago his total cholesterol is 130 with triglycerides 121 HDL 54 and LDL 52.  Given his history, would generally target his LDL to be less than 70 although if we considered him very high risk because of heart failure in the past then a target LDL less than 55 is reasonable.  He has in 2 occasions been an LDL less than 55.  Over the past 8 months however his LDL went up to 72.  He cannot identify as to what caused the LDL is going up.  He says he is not really change his diet much or is not necessarily less active.  I do not see if he has had a prior LP(a).  PMHx:  Past Medical History:  Diagnosis Date   Anxiety    on meds   Aortic root aneurysm    Aortic stenosis    Ascending aortic aneurysm (HCC)    CAD (coronary artery disease)    Chronic combined systolic and  diastolic congestive heart failure (HCC)    on meds   Depression    on meds   Diabetes mellitus without complication (HCC)    on meds   Dilated aortic root (HCC)    GERD (gastroesophageal reflux disease)    Hyperlipidemia    on meds   Hypertension    on meds   Non-ischemic cardiomyopathy (HCC)    Nonischemic cardiomyopathy (HCC)    S/P aortic valve replacement with bioprosthetic valve 04/10/2018   29 mm Edwards Inspiris Resilia stented bovine pericardial tissue valve   S/P ascending aortic aneurysm repair 04/10/2018   26 mm Hemashield Platinum supracoronary straight graft   Seasonal allergies    SVT (supraventricular tachycardia) (HCC)    Viral myocarditis 2004   Vitamin D  deficiency     Past Surgical History:  Procedure Laterality Date   AORTIC VALVE REPLACEMENT N/A 04/10/2018   Procedure: Aortic Valve Replacement (Avr), USING INSPIRIS ;  Surgeon: Dusty Sudie DEL, MD;  Location: [facility] OR;  Service: Open Heart Surgery;  Laterality: N/A;   ASCENDING AORTIC ROOT REPLACEMENT N/A 04/10/2018   Procedure: RESECTION AND GRAFTING OF ASCENDING AORTIC ROOT ANUERYSM (SUPRACORONARY);  Surgeon: Dusty Sudie DEL, MD;  Location: [facility] OR;  Service: Open Heart Surgery;  Laterality: N/A;   CARDIAC VALVE REPLACEMENT     INGUINAL HERNIA  REPAIR Left    MULTIPLE EXTRACTIONS WITH ALVEOLOPLASTY N/A 04/05/2018   Procedure: Extraction of tooth #'s 3, 12, 14,19, 23, 31 and 32 with alveoloplasty and gross debridement of remaining teeth;  Surgeon: Cyndee Tanda FALCON, DDS;  Location: [facility] OR;  Service: Oral Surgery;  Laterality: N/A;   ORCHIECTOMY Left 02/08/1980   RIGHT HEART CATH AND CORONARY ANGIOGRAPHY N/A 03/28/2018   Procedure: RIGHT HEART CATH AND CORONARY ANGIOGRAPHY;  Surgeon: Rolan Ezra RAMAN, MD;  Location: [facility] INVASIVE CV LAB;  Service: Cardiovascular;  Laterality: N/A;   TEE WITHOUT CARDIOVERSION N/A 04/10/2018   Procedure: TRANSESOPHAGEAL ECHOCARDIOGRAM (TEE);  Surgeon: Dusty Sudie DEL, MD;   Location: [facility] OR;  Service: Open Heart Surgery;  Laterality: N/A;   VARICOSE VEIN SURGERY Left 05/09/1998    FAMHx:  Family History  Problem Relation Age of Onset   Diabetes Mother    Hypertension Mother    Alzheimer's disease Mother    Breast cancer Mother 74       breast   Esophageal cancer Father 80   Colon polyps Father    Hypertension Father    Cancer Father        laryngeal, former smoker   Alcohol abuse Father    Hypertension Sister    Colon polyps Maternal Grandmother 11   Colon cancer Maternal Grandmother 10   Diabetes Daughter    Diabetes Son    Rectal cancer Neg Hx    Stomach cancer Neg Hx     SOCHx:   reports that he quit smoking about 5 years ago. His smoking use included cigarettes. He started smoking about 45 years ago. He has a 30.1 pack-year smoking history. He has never used smokeless tobacco. He reports current alcohol use of about 8.0 standard drinks of alcohol per week. He reports that he does not use drugs.  ALLERGIES:  No Known Allergies  ROS: Pertinent items noted in HPI and remainder of comprehensive ROS otherwise negative.  HOME MEDS: Current Outpatient Medications on File Prior to Visit  Medication Sig Dispense Refill   acetaminophen  (TYLENOL ) 500 MG tablet Take 1,000 mg by mouth every 6 (six) hours as needed for mild pain or headache.     aspirin  EC 81 MG tablet Take 81 mg by mouth daily. Swallow whole.     busPIRone  (BUSPAR ) 10 MG tablet TAKE 1 TABLET BY MOUTH THREE TIMES DAILY FOR ANXIETY 270 tablet 0   cetirizine (ZYRTEC) 10 MG tablet Take 10 mg by mouth daily.     Cholecalciferol (VITAMIN D3) 125 MCG (5000 UT) TABS Take 5,000 Units by mouth daily.      empagliflozin  (JARDIANCE ) 10 MG TABS tablet Take 1 tablet (10 mg total) by mouth daily. 60 tablet 11   eplerenone  (INSPRA ) 50 MG tablet Take 1 tablet by mouth once daily 90 tablet 3   ezetimibe  (ZETIA ) 10 MG tablet Take 1 tablet (10 mg total) by mouth daily. 90 tablet 3   furosemide  (LASIX )  40 MG tablet Take 0.5 tablets (20 mg total) by mouth daily.     metoprolol  succinate (TOPROL -XL) 100 MG 24 hr tablet Take 1 tablet (100 mg total) by mouth 2 (two) times daily. 180 tablet 0   mexiletine (MEXITIL ) 150 MG capsule Take 2 capsules by mouth twice daily 360 capsule 1   pantoprazole  (PROTONIX ) 40 MG tablet Take 1 tablet (40 mg total) by mouth daily. 30 tablet 1   rosuvastatin  (CRESTOR ) 40 MG tablet Take 1 tablet (40 mg total) by mouth daily. 90  tablet 0   sacubitril -valsartan  (ENTRESTO ) 97-103 MG Take 1 tablet by mouth 2 (two) times daily. 60 tablet 11   sertraline  (ZOLOFT ) 100 MG tablet Take 1 tablet (100 mg total) by mouth daily. 90 tablet 2   sildenafil  (VIAGRA ) 50 MG tablet Take 1 tablet (50 mg total) by mouth daily as needed for erectile dysfunction. 20 tablet 0   No current facility-administered medications on file prior to visit.    LABS/IMAGING: No results found for this or any previous visit (from the past 48 hours). No results found.  LIPID PANEL:    Component Value Date/Time   CHOL 148 08/24/2023 1240   TRIG 56 08/24/2023 1240   HDL 65 08/24/2023 1240   CHOLHDL 2.3 08/24/2023 1240   VLDL 11 08/24/2023 1240   LDLCALC 72 08/24/2023 1240   LDLCALC 54 12/28/2021 1544    No results found for: LIPOA   WEIGHTS: Wt Readings from Last 3 Encounters:  10/20/23 249 lb (112.9 kg)  08/24/23 250 lb 3.2 oz (113.5 kg)  07/11/23 247 lb 9.6 oz (112.3 kg)    VITALS: BP 112/78   Pulse 64   Ht 6' 3 (1.905 m)   Wt 249 lb (112.9 kg)   BMI 31.12 kg/m   EXAM: Deferred  EKG: Deferred  ASSESSMENT: Dyslipidemia, goal LDL less than 55 (very high risk) Moderate nonobstructive coronary artery disease by coronary CT History of bicuspid aortic valve with AVR and root replacement (2020) Chronic systolic heart failure with LVEF now at 55 to 60% Regular alcohol use  PLAN: 1.   Mr. Beckel has a dyslipidemia with an LDL target less than 55.  He has actually achieved that  fairly recently on his current medication regimen.  There may be dietary lifestyle factors that have caused his cholesterol to climb somewhat.  I would like to check an LP(a).  If that is elevated then I think there is a reason to consider additional therapy such as a PCSK9 inhibitor.  If not, however I would continue to work on diet and lifestyle to see if we can achieve an LDL back below 55 which he previously had last fall.  Will plan to repeat lipid study in about 6 months.  If the cholesterol is still not at target at that point then I think adding a PCSK9 inhibitor would be reasonable.  Thanks again for the kind referral.  Vinie KYM Maxcy, MD, [facility], FNLA, FACP    [facility]  Medical Director of the Advanced Lipid Disorders &  Cardiovascular Risk Reduction Clinic Diplomate of the American Board of Clinical Lipidology Attending Cardiologist  Direct Dial: [phone]  Fax: [phone]  Website:  [email or web address]  Vinie BROCKS Haile Bosler 10/20/2023, 1:22 PM

## 2023-10-20 NOTE — Patient Instructions (Signed)
 Medication Instructions:  No changes  *If you need a refill on your cardiac medications before your next appointment, please call your pharmacy*   Lab Work: Lp(a) If you have labs (blood work) drawn today and your tests are completely normal, you will receive your results only by: MyChart Message (if you have MyChart) OR A paper copy in the mail If you have any lab test that is abnormal or we need to change your treatment, we will call you to review the results.   Testing/Procedures:  Not needed  Follow-Up: At [facility], you and your health needs are our priority.  As part of our continuing mission to provide you with exceptional heart care, we have created designated Provider Care Teams.  These Care Teams include your primary Cardiologist (physician) and Advanced Practice Providers (APPs -  Physician Assistants and Nurse Practitioners) who all work together to provide you with the care you need, when you need it.     Your next appointment:   AS NEEDED   The format for your next appointment:   In Person  Provider:   Dr Mona

## 2023-10-23 LAB — LIPOPROTEIN A (LPA): Lipoprotein (a): 199.6 nmol/L — ABNORMAL HIGH (ref <75.0)

## 2023-10-27 ENCOUNTER — Ambulatory Visit: Payer: Self-pay | Admitting: Internal Medicine

## 2023-10-30 ENCOUNTER — Other Ambulatory Visit (HOSPITAL_COMMUNITY): Payer: Self-pay | Admitting: Cardiology

## 2023-10-30 ENCOUNTER — Telehealth: Payer: Self-pay | Admitting: Pharmacy Technician

## 2023-10-30 ENCOUNTER — Other Ambulatory Visit (HOSPITAL_COMMUNITY): Payer: Self-pay

## 2023-10-30 ENCOUNTER — Other Ambulatory Visit: Payer: Self-pay | Admitting: Internal Medicine

## 2023-10-30 DIAGNOSIS — E785 Hyperlipidemia, unspecified: Secondary | ICD-10-CM

## 2023-10-30 MED ORDER — REPATHA SURECLICK 140 MG/ML ~~LOC~~ SOAJ: 140.0000 mg | SUBCUTANEOUS | 6 refills | Status: AC

## 2023-10-30 NOTE — Telephone Encounter (Signed)
 I called [facility] and this is filled for free

## 2023-10-30 NOTE — Telephone Encounter (Signed)
 Sent Repatha  prescription to [facility].

## 2023-10-30 NOTE — Telephone Encounter (Signed)
   Repatha  and may need assistance per his note  Pharmacy Patient Advocate Encounter   Received notification from Pt Calls Messages that prior authorization for REPATHA  is required/requested.   Insurance verification completed.   The patient is insured through [facility] .   Per test claim: PA required; PA submitted to above mentioned insurance via Latent Key/confirmation #/EOC [id number] Status is pending

## 2023-10-30 NOTE — Telephone Encounter (Signed)
 Pharmacy Patient Advocate Encounter  Received notification from [facility] that Prior Authorization for repatha  has been APPROVED from 10/30/23 to 10/29/24   PA #/Case ID/Reference #: [id number]    He has this filled at his pharmacy

## 2023-11-14 ENCOUNTER — Institutional Professional Consult (permissible substitution) (HOSPITAL_BASED_OUTPATIENT_CLINIC_OR_DEPARTMENT_OTHER): Admitting: Internal Medicine

## 2023-11-17 ENCOUNTER — Other Ambulatory Visit (HOSPITAL_COMMUNITY): Payer: Self-pay

## 2023-11-26 ENCOUNTER — Other Ambulatory Visit: Payer: Self-pay | Admitting: Family Medicine

## 2023-11-26 DIAGNOSIS — K219 Gastro-esophageal reflux disease without esophagitis: Secondary | ICD-10-CM

## 2023-12-28 ENCOUNTER — Other Ambulatory Visit: Payer: Self-pay | Admitting: Family Medicine

## 2023-12-28 DIAGNOSIS — K219 Gastro-esophageal reflux disease without esophagitis: Secondary | ICD-10-CM

## 2023-12-29 ENCOUNTER — Ambulatory Visit

## 2024-01-03 ENCOUNTER — Encounter: Payer: Medicare Other | Admitting: Nurse Practitioner

## 2024-01-11 ENCOUNTER — Ambulatory Visit: Admitting: Family Medicine

## 2024-01-11 ENCOUNTER — Other Ambulatory Visit (HOSPITAL_COMMUNITY): Payer: Self-pay | Admitting: Cardiology

## 2024-01-11 ENCOUNTER — Other Ambulatory Visit (HOSPITAL_COMMUNITY): Payer: Self-pay

## 2024-01-11 MED ORDER — METOPROLOL SUCCINATE ER 100 MG PO TB24
100.0000 mg | ORAL_TABLET | Freq: Two times a day (BID) | ORAL | 0 refills | Status: DC
  Filled 2024-01-11: qty 180, 90d supply, fill #0

## 2024-01-16 ENCOUNTER — Ambulatory Visit: Admitting: Family Medicine

## 2024-01-16 VITALS — BP 111/71 | HR 74 | Temp 97.9°F | Ht 75.0 in | Wt 250.2 lb

## 2024-01-16 DIAGNOSIS — K219 Gastro-esophageal reflux disease without esophagitis: Secondary | ICD-10-CM | POA: Diagnosis not present

## 2024-01-16 DIAGNOSIS — I10 Essential (primary) hypertension: Secondary | ICD-10-CM

## 2024-01-16 DIAGNOSIS — F419 Anxiety disorder, unspecified: Secondary | ICD-10-CM

## 2024-01-16 DIAGNOSIS — Z122 Encounter for screening for malignant neoplasm of respiratory organs: Secondary | ICD-10-CM

## 2024-01-16 DIAGNOSIS — I2510 Atherosclerotic heart disease of native coronary artery without angina pectoris: Secondary | ICD-10-CM

## 2024-01-16 DIAGNOSIS — E782 Mixed hyperlipidemia: Secondary | ICD-10-CM

## 2024-01-16 DIAGNOSIS — E66811 Obesity, class 1: Secondary | ICD-10-CM

## 2024-01-16 DIAGNOSIS — G250 Essential tremor: Secondary | ICD-10-CM | POA: Insufficient documentation

## 2024-01-16 DIAGNOSIS — I5042 Chronic combined systolic (congestive) and diastolic (congestive) heart failure: Secondary | ICD-10-CM

## 2024-01-16 MED ORDER — WEGOVY 0.25 MG/0.5ML ~~LOC~~ SOAJ: 0.2500 mg | SUBCUTANEOUS | 0 refills | Status: DC

## 2024-01-16 MED ORDER — PANTOPRAZOLE SODIUM 40 MG PO TBEC: 40.0000 mg | DELAYED_RELEASE_TABLET | Freq: Every day | ORAL | 3 refills | Status: AC

## 2024-01-16 MED ORDER — BUSPIRONE HCL 10 MG PO TABS: ORAL_TABLET | ORAL | 0 refills | Status: AC

## 2024-01-16 MED ORDER — SERTRALINE HCL 100 MG PO TABS: 100.0000 mg | ORAL_TABLET | Freq: Every day | ORAL | 3 refills | Status: AC

## 2024-01-16 NOTE — Progress Notes (Addendum)
 Assessment & Plan   Assessment/Plan:  Assessment and Plan Assessment & Plan Chronic combined systolic and diastolic heart failure. Discussed potential benefits of Wegovy  for cardiovascular prophylaxis to reduce risk of cardiac mortality. Managed with Maxitrol, metoprolol , Entresto , and furosemide . Blood pressure is well-controlled. - Continue Maxitrol 300 mg BID - Continue metoprolol  succinate 100 mg BID - Continue Entresto  97/103 mg BID - Continue furosemide  40 mg daily - Initiated Wegovy  0.25 mg injection once weekly - Follow up with cardiology as recommended  Coronary artery disease Managed with aspirin  and Repatha . Cardiologist oversees antiplatelet therapy.Discussed potential benefits of Wegovy  for cardiovascular health. He is open to trying Wegovy . Discussed potential GI side effects and the importance of starting at a low dose. - Initiated Wegovy  0.25 mg injection once weekly - Scheduled follow-up in one month to assess tolerance and efficacy - Continue aspirin  81 mg daily  Hypertension - Continue current antihypertensive regimen  Hyperlipidemia Managed with Zetia  and rosuvastatin . Cardiologist oversees lipid management. - Continue Zetia  10 mg daily - Continue rosuvastatin  40 mg daily - Continue Repatha  140 mg every 14 days  Obesity BMI of 31.  - Monitor weight on Wegovey  Major depressive disorder Managed with sertraline . Mood is stable. - Continue sertraline  100 mg daily  Generalized anxiety disorder Managed with buspirone . Mood is stable. - Continue buspirone  10 mg TID  Gastroesophageal reflux disease Managed with pantoprazole . - Continue pantoprazole  40 mg daily  Essential tremor Likely familial. No significant impact on daily activities. - Continue to monitor tremor for any changes  General health maintenance Due for low-dose chest CT for lung cancer screening. Referral to pulmonology recommended. Full lab panel due at six-month follow-up. - Referred  to pulmonology for lung cancer screening - Ordered full lab panel at next visit in 1 month  Obesity Class I BMI 32 Due to excess calorie intake with severe comorbitidy of heart disease - Recommend heart healthy diet and 150 min of moderate level intensity exercise per week      There are no discontinued medications.          Subjective:   Encounter date: 01/16/2024  Tyler Berger is a 63 y.o. male who has Hyperlipidemia; Essential hypertension; Anxiety; Vitamin D  deficiency; Medication management; Varicose veins of both lower extremities; LBBB (left bundle branch block); SVT (supraventricular tachycardia); Former smoker (30 pack year history, quit 03/2018); Non-ischemic cardiomyopathy (HCC); Chronic combined systolic and diastolic congestive heart failure (HCC); S/P aortic valve replacement with bioprosthetic valve + resection ascending thoracic aortic aneurysm; S/P ascending aortic aneurysm repair; PVC (premature ventricular contraction); Coronary artery disease involving native coronary artery of native heart without angina pectoris; Obesity (BMI 30.0-34.9); Abnormal glucose; Thrombocytosis; Erythrocytosis; and Erectile disorder on their problem list..   He  has a past medical history of Anxiety, Aortic root aneurysm, Aortic stenosis, Ascending aortic aneurysm, CAD (coronary artery disease), Chronic combined systolic and diastolic congestive heart failure (HCC), Depression, Diabetes mellitus without complication (HCC), Dilated aortic root, GERD (gastroesophageal reflux disease), Hyperlipidemia, Hypertension, Non-ischemic cardiomyopathy (HCC), Nonischemic cardiomyopathy (HCC), S/P aortic valve replacement with bioprosthetic valve (04/10/2018), S/P ascending aortic aneurysm repair (04/10/2018), Seasonal allergies, SVT (supraventricular tachycardia), Viral myocarditis (2004), and Vitamin D  deficiency.[name]   He presents with chief complaint of Medical Management of Chronic Issues (6 mon f/u. No  specific questions or concerns. Patient not fasting.  ) .  Discussed the use of AI scribe software for clinical note transcription with the patient, who gave verbal consent to proceed.  History of Present Illness  Tyler Berger is a 63 year old male with coronary artery disease, hypertension, hyperlipidemia, and heart failure who presents for a six-month chronic disease follow-up.  Cardiovascular symptoms and management - Coronary artery disease, hypertension, hyperlipidemia, and heart failure. - No recent episodes of chest pain or shortness of breath. - Regimen includes Maxitrol 300 mg BID, metoprolol  succinate 100 mg BID, eplerenone  50 mg daily, empagliflozin  10 mg daily, furosemide  40 mg daily, and Entresto  97/103 mg BID. - Follows with cardiology for antiplatelet therapy; takes aspirin  81 mg daily. - For hyperlipidemia: Zetia  10 mg daily, Repatha  140 mg every 14 days, and rosuvastatin  40 mg daily. - Cholesterol levels monitored by cardiology.  Psychiatric symptoms and management - Anxiety and depression managed with sertraline  100 mg daily and buspirone  10 mg three times a day. - Mood is stable.  Gastrointestinal symptoms and management - Gastroesophageal reflux disease managed with pantoprazole  40 mg daily. - No current gastrointestinal symptoms.  Tremor - Familial history of tremor. - Children have observed increased tremor. - Tremor attributed to age and family history. - No functional impairment from tremor.   Ischemic Heart Disease - Status post open heart surgery  - Followed regularly by Cardiology - Managed with Metoprolol  succinate 100 mg BID and Eplerenone  50 mg daily  Anxiety - Managed with sertraline  100 mg daily and buspirone  10 mg TID.    Polychythemia - Hemoglobin has been chronically elevated, potentially in regards to previous history of smoking and alcohol use.  - Referred to Hematology for evaluation      01/16/2024    4:03 PM 07/11/2023   10:44 AM  11/11/2019    2:19 PM 02/12/2018    3:23 PM  Depression screen PHQ 2/9  Decreased Interest 0 0 1 1  Down, Depressed, Hopeless 0 0 1 1  PHQ - 2 Score 0 0 2 2  Altered sleeping  0 0 1  Tired, decreased energy  0 3 3  Change in appetite  0 0 1  Feeling bad or failure about yourself   0 1 3  Trouble concentrating  0 0 0  Moving slowly or fidgety/restless  0 0 0  Suicidal thoughts  0 0 0  PHQ-9 Score  0  6  10   Difficult doing work/chores  Not difficult at all Somewhat difficult Not difficult at all     Data saved with a previous flowsheet row definition      07/11/2023   10:44 AM 02/12/2018    3:25 PM  GAD 7 : Generalized Anxiety Score  Nervous, Anxious, on Edge 0 3  Control/stop worrying 0 2  Worry too much - different things 0 2  Trouble relaxing 1 0  Restless 1 0  Easily annoyed or irritable 0 0  Afraid - awful might happen 0 3  Total GAD 7 Score 2 10  Anxiety Difficulty Not difficult at all      ROS  Past Surgical History:  Procedure Laterality Date   AORTIC VALVE REPLACEMENT N/A 04/10/2018   Procedure: Aortic Valve Replacement (Avr), USING INSPIRIS ;  Surgeon: Dusty Sudie DEL, MD;  [facility] OR;  Service: Open Heart Surgery;  Laterality: N/A;   ASCENDING AORTIC ROOT REPLACEMENT N/A 04/10/2018   Procedure: RESECTION AND GRAFTING OF ASCENDING AORTIC ROOT ANUERYSM (SUPRACORONARY);  Surgeon: Dusty Sudie DEL, MD;  Location: [facility] OR;  Service: Open Heart Surgery;  Laterality: N/A;   CARDIAC VALVE REPLACEMENT     INGUINAL HERNIA REPAIR Left  MULTIPLE EXTRACTIONS WITH ALVEOLOPLASTY N/A 04/05/2018   Procedure: Extraction of tooth #'s 3, 12, 14,19, 23, 31 and 32 with alveoloplasty and gross debridement of remaining teeth;  Surgeon: Cyndee Tanda FALCON, DDS;  Location: [facility] OR;  Service: Oral Surgery;  Laterality: N/A;   ORCHIECTOMY Left 02/08/1980   RIGHT HEART CATH AND CORONARY ANGIOGRAPHY N/A 03/28/2018   Procedure: RIGHT HEART CATH AND CORONARY ANGIOGRAPHY;  Surgeon:  Rolan Ezra RAMAN, MD;  [facility] CV LAB;  Service: Cardiovascular;  Laterality: N/A;   TEE WITHOUT CARDIOVERSION N/A 04/10/2018   Procedure: TRANSESOPHAGEAL ECHOCARDIOGRAM (TEE);  Surgeon: Dusty Sudie DEL, MD;  Location: [facility] OR;  Service: Open Heart Surgery;  Laterality: N/A;   VARICOSE VEIN SURGERY Left 05/09/1998    Current Outpatient Medications on File Prior to Visit  Medication Sig Dispense Refill   acetaminophen  (TYLENOL ) 500 MG tablet Take 1,000 mg by mouth every 6 (six) hours as needed for mild pain or headache.     aspirin  EC 81 MG tablet Take 81 mg by mouth daily. Swallow whole.     busPIRone  (BUSPAR ) 10 MG tablet TAKE 1 TABLET BY MOUTH THREE TIMES DAILY FOR ANXIETY 270 tablet 0   cetirizine (ZYRTEC) 10 MG tablet Take 10 mg by mouth daily.     Cholecalciferol (VITAMIN D3) 125 MCG (5000 UT) TABS Take 5,000 Units by mouth daily.      empagliflozin  (JARDIANCE ) 10 MG TABS tablet Take 1 tablet (10 mg total) by mouth daily. 60 tablet 11   eplerenone  (INSPRA ) 50 MG tablet Take 1 tablet by mouth once daily 90 tablet 3   Evolocumab  (REPATHA  SURECLICK) 140 MG/ML SOAJ Inject 140 mg into the skin every 14 (fourteen) days. 2 mL 6   ezetimibe  (ZETIA ) 10 MG tablet Take 1 tablet (10 mg total) by mouth daily. 90 tablet 3   furosemide  (LASIX ) 40 MG tablet Take 0.5 tablets (20 mg total) by mouth daily.     metoprolol  succinate (TOPROL -XL) 100 MG 24 hr tablet Take 1 tablet (100 mg total) by mouth 2 (two) times daily. 180 tablet 0   mexiletine (MEXITIL ) 150 MG capsule Take 2 capsules by mouth twice daily 360 capsule 1   pantoprazole  (PROTONIX ) 40 MG tablet Take 1 tablet by mouth once daily 30 tablet 0   rosuvastatin  (CRESTOR ) 40 MG tablet Take 1 tablet by mouth once daily 90 tablet 0   sacubitril -valsartan  (ENTRESTO ) 97-103 MG Take 1 tablet by mouth 2 (two) times daily. 60 tablet 11   sertraline  (ZOLOFT ) 100 MG tablet Take 1 tablet (100 mg total) by mouth daily. 90 tablet 2   sildenafil   (VIAGRA ) 50 MG tablet Take 1 tablet (50 mg total) by mouth daily as needed for erectile dysfunction. 20 tablet 0   No current facility-administered medications on file prior to visit.    Family History  Problem Relation Age of Onset   Diabetes Mother    Hypertension Mother    Alzheimer's disease Mother    Breast cancer Mother 56       breast   Esophageal cancer Father 61   Colon polyps Father    Hypertension Father    Cancer Father        laryngeal, former smoker   Alcohol abuse Father    Hypertension Sister    Colon polyps Maternal Grandmother 16   Colon cancer Maternal Grandmother 38   Diabetes Daughter    Diabetes Son    Rectal cancer Neg Hx    Stomach cancer Neg  Hx     Social History   Socioeconomic History   Marital status: Married    Spouse name: Not on file   Number of children: 2   Years of education: Not on file   Highest education level: Associate degree: occupational, scientist, product/process development, or vocational program  Occupational History   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  Tobacco Use   Smoking status: Former    Current packs/day: 0.00    Average packs/day: 0.7 packs/day for 40.1 years (30.1 ttl pk-yrs)    Types: Cigarettes    Start date: 02/07/1978    Quit date: 03/28/2018    Years since quitting: 5.8   Smokeless tobacco: Never  Vaping Use   Vaping status: Never Used  Substance and Sexual Activity   Alcohol use: Yes    Alcohol/week: 8.0 standard drinks of alcohol    Types: 8 Cans of beer per week   Drug use: Never   Sexual activity: Yes    Partners: Male    Birth control/protection: Condom, None  Other Topics Concern   Not on file  Social History Narrative   Married, med best boy.  Two children.     Social Drivers of Health   Financial Resource Strain: Medium Risk (07/10/2023)   Overall Financial Resource Strain (CARDIA)    Difficulty of Paying Living Expenses: Somewhat hard  Food Insecurity: No Food Insecurity (07/10/2023)   Hunger Vital Sign     Worried About Running Out of Food in the Last Year: Never true    Ran Out of Food in the Last Year: Never true  Transportation Needs: No Transportation Needs (07/10/2023)   PRAPARE - Administrator, Civil Service (Medical): No    Lack of Transportation (Non-Medical): No  Physical Activity: Insufficiently Active (07/10/2023)   Exercise Vital Sign    Days of Exercise per Week: 3 days    Minutes of Exercise per Session: 30 min  Stress: No Stress Concern Present (07/10/2023)   [employer] of Occupational Health - Occupational Stress Questionnaire    Feeling of Stress : Not at all  Social Connections: Moderately Integrated (07/10/2023)   Social Connection and Isolation Panel    Frequency of Communication with Friends and Family: More than three times a week    Frequency of Social Gatherings with Friends and Family: Patient declined    Attends Religious Services: 1 to 4 times per year    Active Member of [employer] or Organizations: No    Attends Engineer, Structural: Not on file    Marital Status: Married  Catering Manager Violence: Not on file                                                                                                  Objective:  Physical Exam: BP 111/71   Pulse 74   Temp 97.9 F (36.6 C)   Ht 6' 3 (1.905 m)   Wt 250 lb 3.2 oz (113.5 kg)   SpO2 98%   BMI 31.27 kg/m    Physical Exam  Physical Exam Constitutional:      Appearance: Normal appearance.  HENT:     Head: Normocephalic and atraumatic.     Right Ear: Hearing normal.     Left Ear: Hearing normal.     Nose: Nose normal.  Eyes:     General: No scleral icterus.       Right eye: No discharge.        Left eye: No discharge.     Extraocular Movements: Extraocular movements intact.  Cardiovascular:     Rate and Rhythm: Normal rate and regular rhythm.     Heart sounds: Normal heart sounds.  Pulmonary:     Effort: Pulmonary effort is normal.     Breath sounds: Normal  breath sounds.  Abdominal:     Palpations: Abdomen is soft.     Tenderness: There is no abdominal tenderness.  Skin:    General: Skin is warm.     Findings: No rash.  Neurological:     General: No focal deficit present.     Mental Status: He is alert.     Cranial Nerves: No cranial nerve deficit.  Psychiatric:        Mood and Affect: Mood normal.        Behavior: Behavior normal.        Thought Content: Thought content normal.        Judgment: Judgment normal.     No results found.  Recent Results (from the past 2160 hours)  Lipoprotein A (LPA)     Status: Abnormal   Collection Time: 10/20/23 11:25 AM  Result Value Ref Range   Lipoprotein (a) 199.6 (H) <75.0 nmol/L    Comment: Note:  Values greater than or equal to 75.0 nmol/L may        indicate an independent risk factor for CHD,        but must be evaluated with caution when applied        to non-Caucasian populations due to the        influence of genetic factors on Lp(a) across        ethnicities.         Beverley KATHEE Hummer, MD  I,Emily Lagle,acting as a scribe for Beverley KATHEE Hummer, MD.,have documented all relevant documentation on the behalf of Beverley KATHEE Hummer, MD.  LILLETTE Beverley KATHEE Hummer, MD, have reviewed all documentation for this visit. The documentation on 01/16/2024 for the exam, diagnosis, procedures, and orders are all accurate and complete.

## 2024-01-22 ENCOUNTER — Other Ambulatory Visit (HOSPITAL_COMMUNITY): Payer: Self-pay | Admitting: Family Medicine

## 2024-01-22 ENCOUNTER — Telehealth: Payer: Self-pay

## 2024-01-22 NOTE — Telephone Encounter (Signed)
 GLP1s not covered for weight loss by Medicare

## 2024-01-22 NOTE — Telephone Encounter (Signed)
 Copied from CRM #[id number]. Topic: Clinical - Prescription Issue >> Jan 22, 2024 11:15 AM Aleatha BROCKS wrote: Reason for CRM: Patient insurance wouldn't approve his Wegovy  according to [facility] on [address] and he wants to see if Dr Sebastian can connect with insurance to help get approved >> Jan 22, 2024 11:58 AM Robinson H wrote: Patient calling in to inform provider that he spoke with [facility] and [facility] and was told in order for them to cover medication they would need a letter from provider stating medication is necessary in order for them to cover.  [place]  [phone]

## 2024-01-22 NOTE — Telephone Encounter (Signed)
 Can we submit a PA for patient wegovy  ?

## 2024-01-24 ENCOUNTER — Other Ambulatory Visit (HOSPITAL_COMMUNITY): Payer: Self-pay

## 2024-01-24 ENCOUNTER — Encounter (HOSPITAL_COMMUNITY): Payer: Self-pay | Admitting: Pharmacy Technician

## 2024-01-24 ENCOUNTER — Other Ambulatory Visit: Payer: Self-pay | Admitting: Pharmacy Technician

## 2024-01-24 DIAGNOSIS — I2510 Atherosclerotic heart disease of native coronary artery without angina pectoris: Secondary | ICD-10-CM

## 2024-01-24 NOTE — Telephone Encounter (Signed)
 Error

## 2024-01-24 NOTE — Telephone Encounter (Signed)
 Pharmacy Patient Advocate Encounter   Received notification from Onbase that prior authorization for Wegovy  0.25MG /0.5ML auto-injectors  is required/requested.   Insurance verification completed.   The patient is insured through [facility].   Per test claim: PA required; PA submitted to above mentioned insurance via Latent Key/confirmation #/EOC [facility] Status is pending

## 2024-01-24 NOTE — Telephone Encounter (Unsigned)
 Copied from CRM #[id number]. Topic: Clinical - Medication Prior Auth >> Jan 24, 2024 10:31 AM Franky GRADE wrote: Reason for CRM: Erminio from [facility] is calling because they received a request for authorization for Wegovy ; however, need documentation to be sent that supports the diagnosis. Fax number [phone].phone number [phone] option 5

## 2024-01-25 ENCOUNTER — Other Ambulatory Visit (HOSPITAL_COMMUNITY): Payer: Self-pay

## 2024-01-25 ENCOUNTER — Telehealth: Payer: Self-pay

## 2024-01-25 ENCOUNTER — Other Ambulatory Visit (HOSPITAL_COMMUNITY): Payer: Self-pay | Admitting: Cardiology

## 2024-01-25 DIAGNOSIS — E785 Hyperlipidemia, unspecified: Secondary | ICD-10-CM

## 2024-01-25 NOTE — Telephone Encounter (Signed)
 Patient has known history of heart disease including CAD and Heart Failure. Note updated. Please submit PA.

## 2024-01-25 NOTE — Telephone Encounter (Signed)
 Pharmacy Patient Advocate Encounter  Received notification from [facility] that Prior Authorization for Wegovy  0.25MG /0.5ML auto-injectors  has been APPROVED from 01/24/2024 to 01/23/2025. Ran test claim, Copay is $0.00. This test claim was processed through [facility]- copay amounts may vary at other pharmacies due to pharmacy/plan contracts, or as the patient moves through the different stages of their insurance plan.   PA #/Case ID/Reference #: [id number]

## 2024-01-25 NOTE — Telephone Encounter (Signed)
 Copied from CRM [id number]. Topic: Clinical - Medication Prior Auth >> Jan 24, 2024  4:07 PM Ashley R wrote: Reason for CRM: Wegovy  12/[phone] approved for non formulary [id number] option 5. Fax copy

## 2024-01-25 NOTE — Telephone Encounter (Signed)
 Informed patient that the wegovy  has been approved via [employer].

## 2024-01-26 ENCOUNTER — Other Ambulatory Visit (HOSPITAL_COMMUNITY): Payer: Self-pay

## 2024-01-26 NOTE — Telephone Encounter (Signed)
 My apologies. I have notified the patient. Thank you

## 2024-01-29 ENCOUNTER — Ambulatory Visit

## 2024-02-15 ENCOUNTER — Other Ambulatory Visit (HOSPITAL_COMMUNITY): Payer: Self-pay

## 2024-02-16 ENCOUNTER — Encounter: Payer: Self-pay | Admitting: Family Medicine

## 2024-02-16 ENCOUNTER — Ambulatory Visit: Admitting: Family Medicine

## 2024-02-16 VITALS — BP 102/72 | HR 67 | Temp 97.6°F | Ht 75.0 in | Wt 242.0 lb

## 2024-02-16 DIAGNOSIS — I10 Essential (primary) hypertension: Secondary | ICD-10-CM

## 2024-02-16 DIAGNOSIS — I2510 Atherosclerotic heart disease of native coronary artery without angina pectoris: Secondary | ICD-10-CM | POA: Diagnosis not present

## 2024-02-16 DIAGNOSIS — E66811 Obesity, class 1: Secondary | ICD-10-CM

## 2024-02-16 DIAGNOSIS — I5042 Chronic combined systolic (congestive) and diastolic (congestive) heart failure: Secondary | ICD-10-CM

## 2024-02-16 DIAGNOSIS — L989 Disorder of the skin and subcutaneous tissue, unspecified: Secondary | ICD-10-CM

## 2024-02-16 MED ORDER — WEGOVY 0.25 MG/0.5ML ~~LOC~~ SOAJ: 0.5000 mg | SUBCUTANEOUS | 0 refills | Status: DC

## 2024-02-16 NOTE — Progress Notes (Signed)
 " Assessment & Plan   Assessment/Plan:    Problem List Items Addressed This Visit   None       Assessment and Plan Assessment & Plan Obesity Management is ongoing with a weight loss of 8 pounds. Current BMI is 30.2, with a goal to reduce it below 30 to decrease mortality risk from obesity-related conditions. No gastrointestinal side effects reported with Wegovy . Mood is well-managed with no negative effects from Wegovy . - Increased Wegovy  dose to 0.5 mg. - Monitor blood pressure at home to prevent hypotension with weight loss. - Continue healthy lifestyle modifications.  Heart failure and hypertension Blood pressure is well-controlled. Weight loss may necessitate adjustments in antihypertensive medications. Eplerenone  is considered for discontinuation due to its diuretic effect, but current heart condition necessitates caution. Other medications like metoprolol  and Entresto  are rate-dependent and may be adjusted if blood pressure drops significantly with weight loss. - Monitor blood pressure regularly. - Will consider medication adjustments if blood pressure decreases significantly with weight loss.  Facial skin lesion, rule out basal cell carcinoma or actinic keratosis Red, scaly lesion in the sideburn area, present for an extended period, possibly a basal cell carcinoma, actinic keratosis, or squamous cell carcinoma. Lesion has an ulcerative sore that does not heal, suggesting a possible skin cancer or precancer. - Referred to dermatologist for biopsy and possible excision of the lesion. - Advised use of sunscreen to prevent further skin damage.  Recording duration: 14 minutes      There are no discontinued medications.  No follow-ups on file.        Subjective:   Encounter date: 02/16/2024  Tyler Berger is a 64 y.o. male who has Hyperlipidemia; Essential hypertension; Anxiety; Vitamin D  deficiency; Medication management; Varicose veins of both lower extremities;  LBBB (left bundle branch block); SVT (supraventricular tachycardia); Former smoker (30 pack year history, quit 03/2018); Non-ischemic cardiomyopathy (HCC); Chronic combined systolic and diastolic congestive heart failure (HCC); S/P aortic valve replacement with bioprosthetic valve + resection ascending thoracic aortic aneurysm; S/P ascending aortic aneurysm repair; PVC (premature ventricular contraction); Coronary artery disease involving native coronary artery of native heart without angina pectoris; Obesity (BMI 30.0-34.9); Abnormal glucose; Thrombocytosis; Erythrocytosis; Erectile disorder; Gastroesophageal reflux disease without esophagitis; and Benign essential tremor on their problem list..   He  has a past medical history of Anxiety, Aortic root aneurysm, Aortic stenosis, Ascending aortic aneurysm, CAD (coronary artery disease), Chronic combined systolic and diastolic congestive heart failure (HCC), Depression, Diabetes mellitus without complication (HCC), Dilated aortic root, GERD (gastroesophageal reflux disease), Hyperlipidemia, Hypertension, Non-ischemic cardiomyopathy (HCC), Nonischemic cardiomyopathy (HCC), S/P aortic valve replacement with bioprosthetic valve (04/10/2018), S/P ascending aortic aneurysm repair (04/10/2018), Seasonal allergies, SVT (supraventricular tachycardia), Viral myocarditis (2004), and Vitamin D  deficiency.[name]   He presents with chief complaint of Weight Check (1 month weight check. Patient states no side effects and is doing well. Feels like it is doing good on the dose he is on. ) .   Discussed the use of AI scribe software for clinical note transcription with the patient, who gave verbal consent to proceed.  History of Present Illness Tyler Berger is a 64 year old male who presents for follow-up on weight management.  Weight management - Lost approximately eight pounds over the past month - Currently taking low dose Wegovy  for one month - No gastrointestinal side  effects, including nausea  Antihypertensive and cardiac medication use - Takes metoprolol , Entresto , and eplerenone  for blood pressure and heart conditions - Does not regularly  monitor blood pressure at home - Occasionally checks blood pressure at [employer]  Mood and mental health - No symptoms of depression, anxiety, or mood disturbance  Injection site pain and numbness - Persistent pain and intermittent numbness at a previous injection site on the thigh for approximately one month - Suspects possible nerve involvement at the site - Avoids injecting in the affected area  Cutaneous lesion - Red, scaly lesion with ulcerative features in the sideburn area - Lesion has been present for an extended period and does not heal     ROS  Past Surgical History:  Procedure Laterality Date   AORTIC VALVE REPLACEMENT N/A 04/10/2018   Procedure: Aortic Valve Replacement (Avr), USING INSPIRIS ;  Surgeon: Dusty Sudie DEL, MD;  Location: [facility] OR;  Service: Open Heart Surgery;  Laterality: N/A;   ASCENDING AORTIC ROOT REPLACEMENT N/A 04/10/2018   Procedure: RESECTION AND GRAFTING OF ASCENDING AORTIC ROOT ANUERYSM (SUPRACORONARY);  Surgeon: Dusty Sudie DEL, MD;  Location: [facility] OR;  Service: Open Heart Surgery;  Laterality: N/A;   CARDIAC VALVE REPLACEMENT     INGUINAL HERNIA REPAIR Left    MULTIPLE EXTRACTIONS WITH ALVEOLOPLASTY N/A 04/05/2018   Procedure: Extraction of tooth #'s 3, 12, 14,19, 23, 31 and 32 with alveoloplasty and gross debridement of remaining teeth;  Surgeon: Cyndee Tanda FALCON, DDS;  Location: [facility] OR;  Service: Oral Surgery;  Laterality: N/A;   ORCHIECTOMY Left 02/08/1980   RIGHT HEART CATH AND CORONARY ANGIOGRAPHY N/A 03/28/2018   Procedure: RIGHT HEART CATH AND CORONARY ANGIOGRAPHY;  Surgeon: Rolan Ezra RAMAN, MD;  Location: [facility] INVASIVE CV LAB;  Service: Cardiovascular;  Laterality: N/A;   TEE WITHOUT CARDIOVERSION N/A 04/10/2018   Procedure: TRANSESOPHAGEAL  ECHOCARDIOGRAM (TEE);  Surgeon: Dusty Sudie DEL, MD;  Location: [facility] OR;  Service: Open Heart Surgery;  Laterality: N/A;   VARICOSE VEIN SURGERY Left 05/09/1998    Outpatient Medications Prior to Visit  Medication Sig Dispense Refill   acetaminophen  (TYLENOL ) 500 MG tablet Take 1,000 mg by mouth every 6 (six) hours as needed for mild pain or headache.     aspirin  EC 81 MG tablet Take 81 mg by mouth daily. Swallow whole.     busPIRone  (BUSPAR ) 10 MG tablet TAKE 1 TABLET BY MOUTH THREE TIMES DAILY FOR ANXIETY 270 tablet 0   cetirizine (ZYRTEC) 10 MG tablet Take 10 mg by mouth daily.     Cholecalciferol (VITAMIN D3) 125 MCG (5000 UT) TABS Take 5,000 Units by mouth daily.      empagliflozin  (JARDIANCE ) 10 MG TABS tablet Take 1 tablet (10 mg total) by mouth daily. 60 tablet 11   eplerenone  (INSPRA ) 50 MG tablet Take 1 tablet by mouth once daily 30 tablet 2   Evolocumab  (REPATHA  SURECLICK) 140 MG/ML SOAJ Inject 140 mg into the skin every 14 (fourteen) days. 2 mL 6   ezetimibe  (ZETIA ) 10 MG tablet Take 1 tablet (10 mg total) by mouth daily. 90 tablet 3   furosemide  (LASIX ) 40 MG tablet Take 0.5 tablets (20 mg total) by mouth daily.     metoprolol  succinate (TOPROL -XL) 100 MG 24 hr tablet Take 1 tablet (100 mg total) by mouth 2 (two) times daily. 180 tablet 0   mexiletine (MEXITIL ) 150 MG capsule Take 2 capsules by mouth twice daily 360 capsule 1   pantoprazole  (PROTONIX ) 40 MG tablet Take 1 tablet (40 mg total) by mouth daily. 90 tablet 3   rosuvastatin  (CRESTOR ) 40 MG tablet Take 1 tablet by mouth once daily 90  tablet 1   sacubitril -valsartan  (ENTRESTO ) 97-103 MG Take 1 tablet by mouth 2 (two) times daily. 60 tablet 11   semaglutide -weight management (WEGOVY ) 0.25 MG/0.5ML SOAJ SQ injection Inject 0.25 mg into the skin once a week. 2 mL 0   sertraline  (ZOLOFT ) 100 MG tablet Take 1 tablet (100 mg total) by mouth daily. FOR MOOD 90 tablet 3   sildenafil  (VIAGRA ) 50 MG tablet Take 1  tablet (50 mg total) by mouth daily as needed for erectile dysfunction. 20 tablet 0   No facility-administered medications prior to visit.    Family History  Problem Relation Age of Onset   Diabetes Mother    Hypertension Mother    Alzheimer's disease Mother    Breast cancer Mother 67       breast   Esophageal cancer Father 64   Colon polyps Father    Hypertension Father    Cancer Father        laryngeal, former smoker   Alcohol abuse Father    Hypertension Sister    Colon polyps Maternal Grandmother 15   Colon cancer Maternal Grandmother 4   Diabetes Daughter    Diabetes Son    Rectal cancer Neg Hx    Stomach cancer Neg Hx     Social History   Socioeconomic History   Marital status: Married    Spouse name: Not on file   Number of children: 2   Years of education: Not on file   Highest education level: Associate degree: occupational, scientist, product/process development, or vocational program  Occupational History   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  Tobacco Use   Smoking status: Former    Current packs/day: 0.00    Average packs/day: 0.8 packs/day for 40.1 years (30.1 ttl pk-yrs)    Types: Cigarettes    Start date: 02/07/1978    Quit date: 03/28/2018    Years since quitting: 5.8   Smokeless tobacco: Never  Vaping Use   Vaping status: Never Used  Substance and Sexual Activity   Alcohol use: Yes    Alcohol/week: 8.0 standard drinks of alcohol    Types: 8 Cans of beer per week   Drug use: Never   Sexual activity: Yes    Partners: Male    Birth control/protection: Condom, None  Other Topics Concern   Not on file  Social History Narrative   Married, med best boy.  Two children.     Social Drivers of Health   Tobacco Use: Medium Risk (02/16/2024)   Patient History    Smoking Tobacco Use: Former    Smokeless Tobacco Use: Never    Passive Exposure: Not on file  Financial Resource Strain: Medium Risk (07/10/2023)   Overall Financial Resource Strain  (CARDIA)    Difficulty of Paying Living Expenses: Somewhat hard  Food Insecurity: No Food Insecurity (07/10/2023)   Hunger Vital Sign    Worried About Running Out of Food in the Last Year: Never true    Ran Out of Food in the Last Year: Never true  Transportation Needs: No Transportation Needs (07/10/2023)   PRAPARE - Administrator, Civil Service (Medical): No    Lack of Transportation (Non-Medical): No  Physical Activity: Insufficiently Active (07/10/2023)   Exercise Vital Sign    Days of Exercise per Week: 3 days    Minutes of Exercise per Session: 30 min  Stress: No Stress Concern Present (07/10/2023)   [employer] of Occupational Health - Occupational Stress Questionnaire    Feeling of Stress :  Not at all  Social Connections: Moderately Integrated (07/10/2023)   Social Connection and Isolation Panel    Frequency of Communication with Friends and Family: More than three times a week    Frequency of Social Gatherings with Friends and Family: Patient declined    Attends Religious Services: 1 to 4 times per year    Active Member of Clubs or Organizations: No    Attends Engineer, Structural: Not on file    Marital Status: Married  Catering Manager Violence: Not on file  Depression (PHQ2-9): Low Risk (01/16/2024)   Depression (PHQ2-9)    PHQ-2 Score: 0  Alcohol Screen: Low Risk (07/10/2023)   Alcohol Screen    Last Alcohol Screening Score (AUDIT): 5  Housing: Low Risk (07/10/2023)   Housing Stability Vital Sign    Unable to Pay for Housing in the Last Year: No    Number of Times Moved in the Last Year: 0    Homeless in the Last Year: No  Utilities: Not on file  Health Literacy: Not on file                                                                                                  Objective:  Physical Exam: BP 102/72   Pulse 67   Temp 97.6 F (36.4 C) (Oral)   Ht 6' 3 (1.905 m)   Wt 242 lb (109.8 kg)   SpO2 97%   BMI 30.25 kg/m     Physical Exam MEASUREMENTS: BMI- 30.0. GENERAL: Alert, cooperative, well developed, no acute distress. HEENT: Normocephalic, normal oropharynx, moist mucous membranes. CHEST: Clear to auscultation bilaterally. No wheezes, rhonchi, or crackles. CARDIOVASCULAR: Normal heart rate and rhythm. S1 and S2 normal without murmurs. ABDOMEN: Soft, non-tender, non-distended, without organomegaly. Normal bowel sounds. EXTREMITIES: No cyanosis or edema. NEUROLOGICAL: Cranial nerves grossly intact. Moves all extremities without gross motor or sensory deficit. SKIN: Red scaly lesion in sideburn area, possibly ulcerated.   Physical Exam  No results found.  No results found for this or any previous visit (from the past 2160 hours).      Beverley Adine Hummer, MD, MS "

## 2024-02-18 DIAGNOSIS — L989 Disorder of the skin and subcutaneous tissue, unspecified: Secondary | ICD-10-CM | POA: Insufficient documentation

## 2024-02-19 ENCOUNTER — Other Ambulatory Visit: Payer: Self-pay | Admitting: Cardiology

## 2024-02-19 ENCOUNTER — Telehealth: Payer: Self-pay | Admitting: Internal Medicine

## 2024-02-19 NOTE — Telephone Encounter (Signed)
 That sounds like a deductible. Will see what prior auth team finds

## 2024-02-19 NOTE — Telephone Encounter (Signed)
 Pt c/o medication issue:  1. Name of Medication:  Evolocumab  (REPATHA  SURECLICK) 140 MG/ML SOAJ   2. How are you currently taking this medication (dosage and times per day)?   3. Are you having a reaction (difficulty breathing--STAT)?   4. What is your medication issue?   Patient says the medication will cost him $540 and he would like to discuss alternatives. He says he took last injection today. Please advise.

## 2024-02-20 ENCOUNTER — Other Ambulatory Visit (HOSPITAL_COMMUNITY): Payer: Self-pay

## 2024-02-20 ENCOUNTER — Encounter: Payer: Medicare Other | Admitting: Nurse Practitioner

## 2024-02-20 ENCOUNTER — Telehealth: Payer: Self-pay | Admitting: Pharmacy Technician

## 2024-02-20 MED ORDER — SEMAGLUTIDE-WEIGHT MANAGEMENT 0.25 MG/0.5ML ~~LOC~~ SOAJ
0.2500 mg | SUBCUTANEOUS | 0 refills | Status: DC
  Filled 2024-02-20 – 2024-02-26 (×4): qty 2, 28d supply, fill #0

## 2024-02-20 NOTE — Telephone Encounter (Signed)
" ° °  Prior shara is still on file 539.58 for repatha  1 month due to 535.20 going to deductible then will be 4.38.   Patient Advocate Encounter   The patient was approved for a [employer] grant that will help cover the cost of repatha  Total amount awarded, 2500.  Effective: 01/21/24 - 01/19/25   APW:[id number] ERW:[id number] Hmnle:[id number] PI:[id number] [employer] ID: [id number]   Pharmacy provided with approval and processing information. Patient informed via mychart  "

## 2024-02-21 NOTE — Telephone Encounter (Signed)
 Is there a patient assistance program for wegovy ?

## 2024-02-23 ENCOUNTER — Telehealth (HOSPITAL_COMMUNITY): Payer: Self-pay | Admitting: Licensed Clinical Social Worker

## 2024-02-23 ENCOUNTER — Other Ambulatory Visit (HOSPITAL_COMMUNITY): Payer: Self-pay

## 2024-02-23 ENCOUNTER — Telehealth: Payer: Self-pay | Admitting: Pharmacy Technician

## 2024-02-23 NOTE — Telephone Encounter (Signed)
 there is no assistance for wegovy . the 319 is his coinsurance so that would be his price. that is with the prior authorization. there is no tier exception for wegovy  or to get the price down. Only has discount option thru [employer] or sign up for coupon. New self pay patients can pay $199 per month for the first two months, applicable to the lowest doses (0.25 mg and 0.5 mg). This offer is available through May 07, 2024. Standard Self Pay Price: After the introductory offer, or for higher doses, the price is $349 per month. This price applies to both new and existing self pay patients. the wegovy  1.5mg  tablets are 149 a month through the wegovy  site. they dont have a discount for the other mg's of the tablets. the zepbound is the same on his insurance or he can get through [facility] direct vials only (prefilled not available) for 299 a month for 2.5mg  and 399 for higher strengths. if he had diabetes it might be cheaper for ozempic  or mounjaro but unclear until we run it. its not even offered on the [facility] site. we have this info saved bc we have those meds all day bc it's so expensive medicare. there was assistance for ozempic  for medicare but that stopped 02/07/24. right now no assistance for the GLP1's

## 2024-02-23 NOTE — Telephone Encounter (Signed)
 H&V Care Navigation CSW Progress Note  CSW received call from pt requesting help with cost of Wegovy  states he was told by pharmacy it would be $319.  CSW forwarded concern to patient advocate who confirmed this is copay even with PA and deductible being met.  Per advocate no tier exception possible and no patient assistance options other than short term coupon $200 self pay rate which is still out of patient price range.  Patient to call back PCP who prescibes med to discuss alternative options.  Andriette HILARIO Leech, LCSW Clinical Social Worker [facility] Desk#: [phone] Cell#: [phone]

## 2024-02-26 ENCOUNTER — Other Ambulatory Visit (HOSPITAL_COMMUNITY): Payer: Self-pay

## 2024-02-26 MED ORDER — WEGOVY 1.5 MG PO TABS: 1.5000 mg | ORAL_TABLET | Freq: Every day | ORAL | 2 refills | Status: AC

## 2024-02-26 NOTE — Addendum Note (Signed)
 Addended by: SEBASTIAN CROCK B on: 02/26/2024 04:46 PM   Modules accepted: Orders

## 2024-03-12 ENCOUNTER — Other Ambulatory Visit (HOSPITAL_COMMUNITY): Payer: Self-pay

## 2024-03-12 ENCOUNTER — Other Ambulatory Visit (HOSPITAL_COMMUNITY): Payer: Self-pay | Admitting: Cardiology

## 2024-03-12 ENCOUNTER — Other Ambulatory Visit: Payer: Self-pay

## 2024-03-12 MED ORDER — METOPROLOL SUCCINATE ER 100 MG PO TB24
100.0000 mg | ORAL_TABLET | Freq: Two times a day (BID) | ORAL | 1 refills | Status: AC
  Filled 2024-03-12: qty 180, 90d supply, fill #0

## 2024-03-15 ENCOUNTER — Other Ambulatory Visit (HOSPITAL_COMMUNITY): Payer: Self-pay

## 2024-04-09 ENCOUNTER — Ambulatory Visit

## 2024-05-16 ENCOUNTER — Ambulatory Visit: Admitting: Family Medicine
